# Patient Record
Sex: Female | Born: 1955 | Race: White | Hispanic: No | Marital: Married | Smoking: Never smoker
Health system: Southern US, Community
[De-identification: ages and names within clinical notes are randomized; demographics above are authoritative.]

## PROBLEM LIST (undated history)

## (undated) DIAGNOSIS — K219 Gastro-esophageal reflux disease without esophagitis: Secondary | ICD-10-CM

## (undated) DIAGNOSIS — G43909 Migraine, unspecified, not intractable, without status migrainosus: Secondary | ICD-10-CM

## (undated) DIAGNOSIS — M797 Fibromyalgia: Secondary | ICD-10-CM

## (undated) DIAGNOSIS — S82401A Unspecified fracture of shaft of right fibula, initial encounter for closed fracture: Secondary | ICD-10-CM

## (undated) DIAGNOSIS — E271 Primary adrenocortical insufficiency: Secondary | ICD-10-CM

## (undated) DIAGNOSIS — G219 Secondary parkinsonism, unspecified: Secondary | ICD-10-CM

## (undated) DIAGNOSIS — C719 Malignant neoplasm of brain, unspecified: Secondary | ICD-10-CM

## (undated) DIAGNOSIS — I10 Essential (primary) hypertension: Secondary | ICD-10-CM

## (undated) DIAGNOSIS — C711 Malignant neoplasm of frontal lobe: Secondary | ICD-10-CM

## (undated) DIAGNOSIS — N3289 Other specified disorders of bladder: Secondary | ICD-10-CM

## (undated) DIAGNOSIS — D649 Anemia, unspecified: Secondary | ICD-10-CM

## (undated) DIAGNOSIS — I639 Cerebral infarction, unspecified: Secondary | ICD-10-CM

## (undated) DIAGNOSIS — E878 Other disorders of electrolyte and fluid balance, not elsewhere classified: Secondary | ICD-10-CM

## (undated) DIAGNOSIS — G5760 Lesion of plantar nerve, unspecified lower limb: Secondary | ICD-10-CM

## (undated) DIAGNOSIS — R569 Unspecified convulsions: Secondary | ICD-10-CM

## (undated) HISTORY — DX: Lesion of plantar nerve, unspecified lower limb: G57.60

## (undated) HISTORY — DX: Cerebral infarction, unspecified: I63.9

## (undated) HISTORY — DX: Unspecified convulsions: R56.9

## (undated) HISTORY — DX: Secondary parkinsonism, unspecified: G21.9

## (undated) HISTORY — DX: Gastro-esophageal reflux disease without esophagitis: K21.9

## (undated) HISTORY — DX: Malignant neoplasm of brain, unspecified: C71.9

## (undated) HISTORY — DX: Fibromyalgia: M79.7

## (undated) HISTORY — DX: Migraine, unspecified, not intractable, without status migrainosus: G43.909

## (undated) HISTORY — PX: EXCISION MORTON'S NEUROMA: SHX5013

## (undated) HISTORY — DX: Other specified disorders of bladder: N32.89

## (undated) HISTORY — DX: Essential (primary) hypertension: I10

## (undated) HISTORY — DX: Other disorders of electrolyte and fluid balance, not elsewhere classified: E87.8

## (undated) HISTORY — PX: OTHER SURGICAL HISTORY: SHX169

---
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
## 1998-02-11 ENCOUNTER — Encounter: Payer: Self-pay | Admitting: Emergency Medicine

## 1998-02-11 ENCOUNTER — Emergency Department (HOSPITAL_COMMUNITY): Admission: EM | Admit: 1998-02-11 | Discharge: 1998-02-11 | Payer: Self-pay | Admitting: Emergency Medicine

## 1999-06-23 ENCOUNTER — Emergency Department (HOSPITAL_COMMUNITY): Admission: EM | Admit: 1999-06-23 | Discharge: 1999-06-23 | Payer: Self-pay

## 1999-12-23 ENCOUNTER — Other Ambulatory Visit: Admission: RE | Admit: 1999-12-23 | Discharge: 1999-12-23 | Payer: Self-pay | Admitting: *Deleted

## 1999-12-25 ENCOUNTER — Encounter: Admission: RE | Admit: 1999-12-25 | Discharge: 1999-12-25 | Payer: Self-pay | Admitting: *Deleted

## 2000-04-20 ENCOUNTER — Encounter: Payer: Self-pay | Admitting: Internal Medicine

## 2000-04-20 ENCOUNTER — Encounter: Admission: RE | Admit: 2000-04-20 | Discharge: 2000-04-20 | Payer: Self-pay | Admitting: Internal Medicine

## 2000-09-05 ENCOUNTER — Emergency Department (HOSPITAL_COMMUNITY): Admission: EM | Admit: 2000-09-05 | Discharge: 2000-09-05 | Payer: Self-pay | Admitting: Emergency Medicine

## 2000-09-05 ENCOUNTER — Encounter: Payer: Self-pay | Admitting: Emergency Medicine

## 2005-08-22 ENCOUNTER — Observation Stay (HOSPITAL_COMMUNITY): Admission: EM | Admit: 2005-08-22 | Discharge: 2005-08-24 | Payer: Self-pay | Admitting: Emergency Medicine

## 2007-02-09 ENCOUNTER — Encounter: Admission: RE | Admit: 2007-02-09 | Discharge: 2007-02-09 | Payer: Self-pay | Admitting: Internal Medicine

## 2008-04-03 ENCOUNTER — Encounter: Admission: RE | Admit: 2008-04-03 | Discharge: 2008-04-03 | Payer: Self-pay

## 2008-07-12 ENCOUNTER — Encounter (INDEPENDENT_AMBULATORY_CARE_PROVIDER_SITE_OTHER): Payer: Self-pay | Admitting: *Deleted

## 2008-09-18 ENCOUNTER — Ambulatory Visit: Payer: Self-pay | Admitting: Family Medicine

## 2008-09-18 DIAGNOSIS — C711 Malignant neoplasm of frontal lobe: Secondary | ICD-10-CM

## 2008-09-18 DIAGNOSIS — M546 Pain in thoracic spine: Secondary | ICD-10-CM | POA: Insufficient documentation

## 2008-09-18 HISTORY — DX: Malignant neoplasm of frontal lobe: C71.1

## 2008-09-19 ENCOUNTER — Telehealth (INDEPENDENT_AMBULATORY_CARE_PROVIDER_SITE_OTHER): Payer: Self-pay | Admitting: *Deleted

## 2008-09-19 LAB — CONVERTED CEMR LAB
AST: 14 units/L (ref 0–37)
Alkaline Phosphatase: 66 units/L (ref 39–117)
BUN: 22 mg/dL (ref 6–23)
Basophils Absolute: 0 10*3/uL (ref 0.0–0.1)
Bilirubin, Direct: 0 mg/dL (ref 0.0–0.3)
CO2: 29 meq/L (ref 19–32)
Calcium: 9.5 mg/dL (ref 8.4–10.5)
Chloride: 109 meq/L (ref 96–112)
Creatinine, Ser: 1 mg/dL (ref 0.4–1.2)
GFR calc non Af Amer: 62.12 mL/min (ref >60)
Glucose, Bld: 77 mg/dL (ref 70–99)
HCT: 40 % (ref 36.0–46.0)
HDL: 42.3 mg/dL (ref >39.00)
MCHC: 33 g/dL (ref 30.0–36.0)
RBC: 4.39 M/uL (ref 3.87–5.11)
RDW: 13.7 % (ref 11.5–14.6)
Sodium: 144 meq/L (ref 135–145)
Total Bilirubin: 0.5 mg/dL (ref 0.3–1.2)
Triglycerides: 92 mg/dL (ref 0.0–149.0)

## 2008-09-25 ENCOUNTER — Encounter: Payer: Self-pay | Admitting: Family Medicine

## 2008-09-25 ENCOUNTER — Ambulatory Visit: Payer: Self-pay | Admitting: Internal Medicine

## 2008-09-28 ENCOUNTER — Encounter (INDEPENDENT_AMBULATORY_CARE_PROVIDER_SITE_OTHER): Payer: Self-pay | Admitting: *Deleted

## 2008-10-03 ENCOUNTER — Telehealth (INDEPENDENT_AMBULATORY_CARE_PROVIDER_SITE_OTHER): Payer: Self-pay | Admitting: *Deleted

## 2008-10-16 ENCOUNTER — Ambulatory Visit: Payer: Self-pay | Admitting: Family Medicine

## 2008-10-16 ENCOUNTER — Encounter: Payer: Self-pay | Admitting: Family Medicine

## 2008-10-16 ENCOUNTER — Other Ambulatory Visit: Admission: RE | Admit: 2008-10-16 | Discharge: 2008-10-16 | Payer: Self-pay | Admitting: Family Medicine

## 2008-10-16 DIAGNOSIS — E785 Hyperlipidemia, unspecified: Secondary | ICD-10-CM | POA: Insufficient documentation

## 2008-10-20 ENCOUNTER — Encounter (INDEPENDENT_AMBULATORY_CARE_PROVIDER_SITE_OTHER): Payer: Self-pay | Admitting: *Deleted

## 2008-10-30 ENCOUNTER — Encounter: Payer: Self-pay | Admitting: Family Medicine

## 2009-01-15 ENCOUNTER — Ambulatory Visit: Payer: Self-pay | Admitting: Family

## 2009-01-16 ENCOUNTER — Encounter (INDEPENDENT_AMBULATORY_CARE_PROVIDER_SITE_OTHER): Payer: Self-pay | Admitting: *Deleted

## 2009-01-16 LAB — CONVERTED CEMR LAB
ALT: 24 units/L (ref 0–35)
AST: 22 units/L (ref 0–37)
Albumin: 3.8 g/dL (ref 3.5–5.2)
Alkaline Phosphatase: 63 units/L (ref 39–117)
Total Bilirubin: 0.7 mg/dL (ref 0.3–1.2)
Total Protein: 7.5 g/dL (ref 6.0–8.3)

## 2009-01-29 ENCOUNTER — Telehealth (INDEPENDENT_AMBULATORY_CARE_PROVIDER_SITE_OTHER): Payer: Self-pay | Admitting: *Deleted

## 2009-01-30 ENCOUNTER — Ambulatory Visit: Payer: Self-pay | Admitting: Family Medicine

## 2009-02-05 ENCOUNTER — Telehealth: Payer: Self-pay | Admitting: Family Medicine

## 2009-02-10 ENCOUNTER — Emergency Department (HOSPITAL_COMMUNITY): Admission: EM | Admit: 2009-02-10 | Discharge: 2009-02-10 | Payer: Self-pay | Admitting: Emergency Medicine

## 2009-02-14 ENCOUNTER — Encounter: Payer: Self-pay | Admitting: Family Medicine

## 2009-02-15 ENCOUNTER — Telehealth (INDEPENDENT_AMBULATORY_CARE_PROVIDER_SITE_OTHER): Payer: Self-pay | Admitting: *Deleted

## 2009-03-12 ENCOUNTER — Encounter: Payer: Self-pay | Admitting: Family Medicine

## 2009-04-09 ENCOUNTER — Ambulatory Visit: Payer: Self-pay | Admitting: Family Medicine

## 2009-04-10 ENCOUNTER — Telehealth (INDEPENDENT_AMBULATORY_CARE_PROVIDER_SITE_OTHER): Payer: Self-pay | Admitting: *Deleted

## 2009-04-10 LAB — CONVERTED CEMR LAB
Alkaline Phosphatase: 62 units/L (ref 39–117)
Bilirubin, Direct: 0 mg/dL (ref 0.0–0.3)
Direct LDL: 177.8 mg/dL
Total Bilirubin: 0.2 mg/dL — ABNORMAL LOW (ref 0.3–1.2)
Triglycerides: 88 mg/dL (ref 0.0–149.0)

## 2009-04-17 ENCOUNTER — Encounter (INDEPENDENT_AMBULATORY_CARE_PROVIDER_SITE_OTHER): Payer: Self-pay | Admitting: *Deleted

## 2009-04-17 ENCOUNTER — Encounter: Payer: Self-pay | Admitting: Family Medicine

## 2009-04-17 LAB — CONVERTED CEMR LAB
AST: 20 units/L
Albumin: 3.8 g/dL
Alkaline Phosphatase: 63 units/L
Chloride, Serum: 109 mmol/L
Glucose, Bld: 93 mg/dL
Potassium, serum: 4.6 mmol/L
Sodium, serum: 144 mmol/L
Total Protein: 6.7 g/dL

## 2009-04-23 ENCOUNTER — Encounter: Admission: RE | Admit: 2009-04-23 | Discharge: 2009-04-23 | Payer: Self-pay | Admitting: Family Medicine

## 2009-04-24 ENCOUNTER — Telehealth: Payer: Self-pay | Admitting: Family Medicine

## 2009-05-21 ENCOUNTER — Ambulatory Visit: Payer: Self-pay | Admitting: Family Medicine

## 2009-05-23 LAB — CONVERTED CEMR LAB
ALT: 17 units/L (ref 0–35)
AST: 20 units/L (ref 0–37)
Bilirubin, Direct: 0.1 mg/dL (ref 0.0–0.3)
Total Bilirubin: 0.3 mg/dL (ref 0.3–1.2)
Total Protein: 6.7 g/dL (ref 6.0–8.3)

## 2009-06-22 ENCOUNTER — Telehealth (INDEPENDENT_AMBULATORY_CARE_PROVIDER_SITE_OTHER): Payer: Self-pay | Admitting: *Deleted

## 2009-06-28 ENCOUNTER — Ambulatory Visit: Payer: Self-pay | Admitting: Family Medicine

## 2009-06-28 DIAGNOSIS — J309 Allergic rhinitis, unspecified: Secondary | ICD-10-CM | POA: Insufficient documentation

## 2009-07-16 ENCOUNTER — Ambulatory Visit: Payer: Self-pay | Admitting: Family Medicine

## 2009-07-16 DIAGNOSIS — B369 Superficial mycosis, unspecified: Secondary | ICD-10-CM | POA: Insufficient documentation

## 2009-08-27 ENCOUNTER — Ambulatory Visit: Payer: Self-pay | Admitting: Family Medicine

## 2009-08-27 DIAGNOSIS — R300 Dysuria: Secondary | ICD-10-CM | POA: Insufficient documentation

## 2009-08-27 LAB — CONVERTED CEMR LAB
Blood in Urine, dipstick: NEGATIVE
Glucose, Urine, Semiquant: NEGATIVE
Ketones, urine, test strip: NEGATIVE
Specific Gravity, Urine: 1.015
Urobilinogen, UA: 0.2
WBC Urine, dipstick: NEGATIVE

## 2009-09-21 ENCOUNTER — Telehealth (INDEPENDENT_AMBULATORY_CARE_PROVIDER_SITE_OTHER): Payer: Self-pay | Admitting: *Deleted

## 2009-10-09 ENCOUNTER — Telehealth (INDEPENDENT_AMBULATORY_CARE_PROVIDER_SITE_OTHER): Payer: Self-pay | Admitting: *Deleted

## 2009-11-12 ENCOUNTER — Telehealth: Payer: Self-pay | Admitting: Family Medicine

## 2009-11-14 ENCOUNTER — Telehealth: Payer: Self-pay | Admitting: Family Medicine

## 2009-11-27 ENCOUNTER — Other Ambulatory Visit: Admission: RE | Admit: 2009-11-27 | Discharge: 2009-11-27 | Payer: Self-pay | Admitting: Family Medicine

## 2009-11-27 ENCOUNTER — Ambulatory Visit: Payer: Self-pay | Admitting: Family Medicine

## 2009-11-27 ENCOUNTER — Encounter: Payer: Self-pay | Admitting: Internal Medicine

## 2009-11-27 DIAGNOSIS — G43009 Migraine without aura, not intractable, without status migrainosus: Secondary | ICD-10-CM | POA: Insufficient documentation

## 2009-11-27 DIAGNOSIS — F329 Major depressive disorder, single episode, unspecified: Secondary | ICD-10-CM

## 2009-11-27 DIAGNOSIS — R252 Cramp and spasm: Secondary | ICD-10-CM

## 2009-11-27 DIAGNOSIS — G4700 Insomnia, unspecified: Secondary | ICD-10-CM

## 2009-11-27 DIAGNOSIS — F3289 Other specified depressive episodes: Secondary | ICD-10-CM | POA: Insufficient documentation

## 2009-11-27 DIAGNOSIS — IMO0001 Reserved for inherently not codable concepts without codable children: Secondary | ICD-10-CM

## 2009-11-30 LAB — CONVERTED CEMR LAB
Basophils Relative: 0.6 % (ref 0.0–3.0)
Bilirubin, Direct: 0.2 mg/dL (ref 0.0–0.3)
Chloride: 105 meq/L (ref 96–112)
GFR calc non Af Amer: 64.04 mL/min (ref >60)
LDL Cholesterol: 97 mg/dL (ref 0–99)
Lymphocytes Relative: 25.9 % (ref 12.0–46.0)
Lymphs Abs: 1.4 10*3/uL (ref 0.7–4.0)
MCV: 92 fL (ref 78.0–100.0)
Monocytes Absolute: 0.4 10*3/uL (ref 0.1–1.0)
Monocytes Relative: 7.7 % (ref 3.0–12.0)
Platelets: 297 10*3/uL (ref 150.0–400.0)
RDW: 13.8 % (ref 11.5–14.6)
TSH: 1.58 microintl units/mL (ref 0.35–5.50)
Triglycerides: 90 mg/dL (ref 0.0–149.0)
Vit D, 25-Hydroxy: 53 ng/mL (ref 30–89)
WBC: 5.3 10*3/uL (ref 4.5–10.5)

## 2009-12-03 LAB — CONVERTED CEMR LAB

## 2009-12-25 ENCOUNTER — Encounter: Payer: Self-pay | Admitting: Family Medicine

## 2009-12-25 ENCOUNTER — Ambulatory Visit: Payer: Self-pay | Admitting: Family Medicine

## 2009-12-27 ENCOUNTER — Encounter: Payer: Self-pay | Admitting: Family Medicine

## 2009-12-27 LAB — CONVERTED CEMR LAB
Anti Nuclear Antibody(ANA): NEGATIVE
Rhuematoid fact SerPl-aCnc: <20 intl units/mL (ref 0–20)
Total CK: 92 units/L (ref 7–177)

## 2010-01-10 ENCOUNTER — Encounter (INDEPENDENT_AMBULATORY_CARE_PROVIDER_SITE_OTHER): Payer: Self-pay | Admitting: *Deleted

## 2010-01-11 ENCOUNTER — Ambulatory Visit
Admission: RE | Admit: 2010-01-11 | Discharge: 2010-01-11 | Payer: Self-pay | Source: Home / Self Care | Attending: Internal Medicine | Admitting: Internal Medicine

## 2010-01-27 ENCOUNTER — Encounter: Payer: Self-pay | Admitting: Internal Medicine

## 2010-01-30 ENCOUNTER — Telehealth (INDEPENDENT_AMBULATORY_CARE_PROVIDER_SITE_OTHER): Payer: Self-pay | Admitting: *Deleted

## 2010-01-31 ENCOUNTER — Ambulatory Visit
Admission: RE | Admit: 2010-01-31 | Discharge: 2010-01-31 | Payer: Self-pay | Source: Home / Self Care | Attending: Internal Medicine | Admitting: Internal Medicine

## 2010-01-31 ENCOUNTER — Encounter: Payer: Self-pay | Admitting: Internal Medicine

## 2010-02-07 ENCOUNTER — Telehealth: Payer: Self-pay | Admitting: Family Medicine

## 2010-02-07 NOTE — Progress Notes (Signed)
Summary: simvastatin refill   Phone Note Refill Request Message from:  Fax from Pharmacy on pg drug fax [phone]  Refills Requested: Medication #1:  SIMVASTATIN 40 MG TABS Take one tablet at bedtime. Initial call taken by: Barb Merino,  February 15, 2009 10:30 AM    Prescriptions: SIMVASTATIN 40 MG TABS (SIMVASTATIN) Take one tablet at bedtime  #30 x 3   Entered by:   Doristine Devoid   Authorized by:   Neena Rhymes MD   Signed by:   Doristine Devoid on 02/15/2009   Method used:   Electronically to        [facility]* (retail)       [address] [place], [place]  [address]       Ph: [phone] or [phone]       Fax: [phone]   RxID:   [id number]

## 2010-02-07 NOTE — Letter (Signed)
Summary: Miralax Instructions  [facility]  [address]   [place], [place] [address]   Phone: [phone]  Fax: [phone]       [name]    [birth year]    MRN: [id number]       Procedure Day Dorna Bloom: Thursday, 01-31-10     Arrival Time: 10:30 a.m.     Procedure Time: 11:30 a.m.     Location of Procedure:                    x   [facility] (4th Floor)    PREPARATION FOR COLONOSCOPY WITH MIRALAX  Starting 5 days prior to your procedure 01-26-10 do not eat nuts, seeds, popcorn, corn, beans, peas,  salads, or any raw vegetables.  Do not take any fiber supplements (e.g. Metamucil, Citrucel, and Benefiber). ____________________________________________________________________________________________________   THE DAY BEFORE YOUR PROCEDURE         DATE: 01-30-10 DAY: Wednesday  1   Drink clear liquids the entire day-NO SOLID FOOD  2   Do not drink anything colored red or purple.  Avoid juices with pulp.  No orange juice.  3   Drink at least 64 oz. (8 glasses) of fluid/clear liquids during the day to prevent dehydration and help the prep work efficiently.  CLEAR LIQUIDS INCLUDE: Water Jello Ice Popsicles Tea (sugar ok, no milk/cream) Powdered fruit flavored drinks Coffee (sugar ok, no milk/cream) Gatorade Juice: apple, white grape, white cranberry  Lemonade Clear bullion, consomm, broth Carbonated beverages (any kind) Strained chicken noodle soup Hard Candy  4   Mix the entire bottle of Miralax with 64 oz. of Gatorade/Powerade in the morning and put in the refrigerator to chill.  5   At 3:00 pm take 2 Dulcolax/Bisacodyl tablets.  6   At 4:30 pm take one Reglan/Metoclopramide tablet.  7  Starting at 5:00 pm drink one 8 oz glass of the Miralax mixture every 15-20 minutes until you have finished drinking the entire 64 oz.  You should finish drinking prep around 7:30 or 8:00 pm.  8   If you are nauseated, you may take the 2nd  Reglan/Metoclopramide tablet at 6:30 pm.        9    At 8:00 pm take 2 more DULCOLAX/Bisacodyl tablets.     THE DAY OF YOUR PROCEDURE      DATE:  01-31-10  DAY: Thursday  You may drink clear liquids until 9:30 a.m. (2 HOURS BEFORE PROCEDURE).   MEDICATION INSTRUCTIONS  Unless otherwise instructed, you should take regular prescription medications with a small sip of water as early as possible the morning of your procedure.           OTHER INSTRUCTIONS  You will need a responsible adult at least 53 years of age to accompany you and drive you home.   This person must remain in the waiting room during your procedure.  Wear loose fitting clothing that is easily removed.  Leave jewelry and other valuables at home.  However, you may wish to bring a book to read or an iPod/MP3 player to listen to music as you wait for your procedure to start.  Remove all body piercing jewelry and leave at home.  Total time from sign-in until discharge is approximately 2-3 hours.  You should go home directly after your procedure and rest.  You can resume normal activities the day after your procedure.  The day of your procedure you should not:   Drive  Make legal decisions   Operate machinery   Drink alcohol   Return to work  You will receive specific instructions about eating, activities and medications before you leave.   The above instructions have been reviewed and explained to me by   Sherren Kerns RN  January 11, 2010 8:51 AM   I fully understand and can verbalize these instructions _____________________________ Date _______

## 2010-02-07 NOTE — Progress Notes (Signed)
Summary: fyi re Crestor  Phone Note Call from Patient Call back at Home Phone [phone]   Caller: Patient Summary of Call: c/o having a terrible time going to the bathroom since starting Crestor about a week ago.  -Pt has not tried anything OTC, recommed pt can use Miralax, dulcolax, pt agreed she will try Miralax, will call if no better Initial call taken by: Kandice Hams,  April 24, 2009 12:15 PM  Follow-up for Phone Call        agree.  will follow Follow-up by: Neena Rhymes MD,  April 24, 2009 12:44 PM

## 2010-02-07 NOTE — Assessment & Plan Note (Signed)
Summary: CPX & LAB/CBS  Flu Vaccine Consent Questions     Do you have a history of severe allergic reactions to this vaccine? no    Any prior history of allergic reactions to egg and/or gelatin? no    Do you have a sensitivity to the preservative Thimersol? no    Do you have a past history of Guillan-Barre Syndrome? no    Do you currently have an acute febrile illness? no    Have you ever had a severe reaction to latex? no    Vaccine information given and explained to patient? yes    Are you currently pregnant? no    Lot Number:[id number]   Exp Date:07/06/2010   Site Given  Left Deltoid IM    Vital Signs:  Patient profile:   55 year old female Height:      63 inches Weight:      217 pounds BMI:     38.58 Pulse rate:   91 / minute BP sitting:   110 / 78  (left arm)  Vitals Entered By: Doristine Devoid CMA (November 27, 2009 8:26 AM) CC: CPX AND LABS W/ PAP   History of Present Illness: 55 yo woman here today for CPE.    Migraine- recurrent problem for pt, currently having sxs.  usual tx is toradol and phenergan.  pt has a driver.  leg cramps- occuring at night.  both calfs and feet.  insomnia- reports problems staying asleep.  'i fall asleep pretty easily but once i'm up- i'm stuck'.  unisom w/out relief.  myalgias- reports arms and hands 'feel like they're going to fall off' when pt wakes in the AM.  has not discussed this w/ neuro.  mom w/ fibromylagia 'really bad'.  weight gain- has gained 8 lbs since Aug.  has been as big as 250 and as low as 180.  denies change in appetite or diet.  not exercising- 'i just don't feel like it'.  cough- sxs started 2 weeks ago, slowly improving.  no fevers.  will have CP w/ cough.  cough is rarely productive of sputum.  no ear pain.  + maxillary pressure.  + sick contacts.  Preventive Screening-Counseling & Management  Alcohol-Tobacco     Alcohol drinks/day: 0     Smoking Status: never  Caffeine-Diet-Exercise     Does Patient Exercise:  no      Sexual History:  currently monogamous.        Drug Use:  never.    Allergies: 1)  ! Pcn 2)  ! Cipro 3)  ! * Methocarbamol  Past History:  Past medical, surgical, family and social histories (including risk factors) reviewed, and no changes noted (except as noted below).  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 APPENDECTOMY 1976 [facility] NEUROMA R FOOT BRAIN CA  1993 2005  Family History: Reviewed history from 09/18/2008 and no changes required. DIABETES--MOTHER,BROTHER HTN--MOTHER,FATHER  Social History: Reviewed history from 09/18/2008 and no changes required. Married Never Smoked Alcohol use-no Drug use-no  Review of Systems       The patient complains of weight gain, headaches, hematochezia, and depression.  The patient denies anorexia, fever, weight loss, vision loss, decreased hearing, hoarseness, chest pain, syncope, dyspnea on exertion, peripheral edema, prolonged cough, abdominal pain, melena, severe indigestion/heartburn, hematuria, suspicious skin lesions, abnormal bleeding, enlarged  lymph nodes, and breast masses.         BRBPR- hx of hemorrhoids, GI eval upcoming  Physical Exam  General:  Overwt, well-developed,well-nourished,in no acute distress; alert,appropriate and cooperative throughout examination Head:  Normocephalic and atraumatic without obvious abnormalities. No apparent alopecia or balding.  no TTP over sinuses Eyes:  No corneal or conjunctival inflammation noted. EOMI. Perrla. Funduscopic exam benign, without hemorrhages, exudates or papilledema. Vision grossly normal. Ears:  External ear exam shows no significant lesions or deformities.  Otoscopic examination reveals clear canals, tympanic membranes are intact bilaterally  without bulging, retraction, inflammation or discharge. Hearing is grossly normal bilaterally. Nose:  + congestion, mild turbinate edema Mouth:  + PND, otherwise normal Neck:  No deformities, masses, or tenderness noted. Breasts:  No mass, nodules, thickening, tenderness, bulging, retraction, inflamation, nipple discharge or skin changes noted.   Lungs:  Normal respiratory effort, chest expands symmetrically. Lungs are clear to auscultation, no crackles or wheezes. Heart:  Normal rate and regular rhythm. S1 and S2 normal without gallop, murmur, click, rub or other extra sounds. Abdomen:  Bowel sounds positive,abdomen soft and non-tender without masses, organomegaly or hernias noted. Genitalia:  Pelvic Exam:        External: normal female genitalia without lesions or masses        Vagina: normal without lesions or masses        Cervix: normal without lesions or masses        Adnexa: normal bimanual exam without masses or fullness        Uterus: normal by palpation        Pap smear: performed Msk:  + TTP over multiple trigger points on back, arms, legs Pulses:  +2 carotid, radial, DP Extremities:  No clubbing, cyanosis, edema, or deformity noted with normal full range of motion of all joints.   Neurologic:  No cranial nerve deficits noted. Station and gait are normal. Plantar reflexes are down-going bilaterally. DTRs are symmetrical throughout. Sensory, motor and coordinative functions appear intact. Skin:  Intact without suspicious lesions or rashes Cervical Nodes:  No lymphadenopathy noted Axillary Nodes:  No palpable lymphadenopathy Psych:  Cognition and judgment appear intact. Alert and cooperative with normal attention span and concentration. No apparent delusions, illusions, hallucinations   Impression & Recommendations:  Problem # 1:  PHYSICAL EXAMINATION (ICD-V70.0) Assessment Unchanged Pt's PE WNL w/ exception of TTP over multiple trigger points.  check labs.  pap collected.  UTD  on mammogram.  colonoscopy upcoming.  anticipatory guidance provided. Orders: Venipuncture (16109) T-Vitamin D (25-Hydroxy) (60454-09811) Specimen Handling (91478) TLB-BMP (Basic Metabolic Panel-BMET) (80048-METABOL) TLB-CBC Platelet - w/Differential (85025-CBCD) TLB-TSH (Thyroid Stimulating Hormone) (84443-TSH)  Problem # 2:  COMMON MIGRAINE (ICD-346.10) Assessment: New pt reports this is not new for her.  usual tx is toradol and phenergan.  injxns provided. Her updated medication list for this problem includes:    Naprosyn 500 Mg Tabs (Naproxen) .Marland Kitchen... Take one tablet two times a day x7 days then as needed  Orders: Promethazine up to 50mg  (J2550) Ketorolac-Toradol 15mg  (G9562) Admin of Therapeutic Inj  intramuscular or subcutaneous (13086)  Problem # 3:  LEG CRAMPS (ICD-729.82) Assessment: New check K+.  encouraged increased fluid intake.  reviewed foods high in K+.  Problem # 4:  DEPRESSIVE DISORDER (ICD-311) Assessment: New pt's cluster of sxs- depression, insomnia, myalgias- plus TTP over multiple trigger points is consistent w/ fibromyalgia (which runs in the family).  start cymbalta for depression in the hopes that it will also improve  her other fibro sxs.  Pt expresses understanding and is in agreement w/ this plan. Her updated medication list for this problem includes:    Cymbalta 30 Mg Cpep (Duloxetine hcl) .Marland Kitchen... 1 tab by mouth daily  Problem # 5:  MYALGIA (ICD-729.1) Assessment: New see #4 The following medications were removed from the medication list:    Methocarbamol 500 Mg Tabs (Methocarbamol) .Marland Kitchen... Take 1-2 tabs three times a day as needed Her updated medication list for this problem includes:    Naprosyn 500 Mg Tabs (Naproxen) .Marland Kitchen... Take one tablet two times a day x7 days then as needed  Problem # 6:  INSOMNIA-SLEEP DISORDER-UNSPEC (ICD-780.52) Assessment: New likely related to her depression (see #4)  Problem # 7:  COUGH (ICD-786.2) Assessment: New likely  viral.  no evidence of bacterial infxn on PE.  reviewed supportive care and red flags that should prompt return.  Pt expresses understanding and is in agreement w/ this plan.  Problem # 8:  OBESITY (ICD-278.00) Assessment: Unchanged pt gaining wt w/out changing diet.  not exercising due to pains and fatigue- likely a consequence of her depression.  start cymbalta and follow closely.  Complete Medication List: 1)  Keppra 500 Mg Tabs (Levetiracetam) .... 3 in am and 3 in pm 2)  Citracal  .... 1 in am 1 in pm 3)  Omega 3  .Marland Kitchen.. 1 in am 1 in pm 4)  Estrace 0.1 Mg/gm Crea (Estradiol) .... Use 3x/week 5)  Crestor 5 Mg Tabs (Rosuvastatin calcium) .... Take 1 tab by mouth daily 6)  Naprosyn 500 Mg Tabs (Naproxen) .... Take one tablet two times a day x7 days then as needed 7)  Nasonex 50 Mcg/act Susp (Mometasone furoate) .... 2 sprays each nostril once daily 8)  Nystatin 100000 Unit/gm Powd (Nystatin) .... Apply to affected area 2-3x/day.  disp 1 large container 9)  Cymbalta 30 Mg Cpep (Duloxetine hcl) .Marland Kitchen.. 1 tab by mouth daily  Other Orders: Admin 1st Vaccine (63875) Flu Vaccine 19yrs + (64332) TLB-Lipid Panel (80061-LIPID) TLB-Hepatic/Liver Function Pnl (80076-HEPATIC)  Patient Instructions: 1)  Please schedule a follow-up appointment in 1 month to recheck mood/pain/sleep. 2)  Start the Cymbalta daily to improve your mood and pain- hopefully the sleep, too 3)  Increase the potassium in your diet 4)  Drink plenty of fluids 5)  Your cough is likely a virus and should continue to improve w/ time 6)  We'll notify you of your lab results 7)  Call with any questions or concerns 8)  Hang in there! 9)  Happy Thanksgiving!  Prescriptions: CYMBALTA 30 MG CPEP (DULOXETINE HCL) 1 tab by mouth daily  #30 x 6   Entered and Authorized by:   Neena Rhymes MD   Signed by:   Neena Rhymes MD on 11/27/2009   Method used:   Electronically to        [facility]* (retail)       [address] [place], [place]  [address]       Ph: [phone] or [phone]       Fax: [phone]   RxID:   [id number]    Medication Administration  Injection # 1:    Medication: Promethazine up to 50mg     Diagnosis: COMMON MIGRAINE (ICD-346.10)    Route: IM    Site: LUOQ gluteus    Exp Date: 09/07/2010    Lot #:  [id number]    Mfr: Novaplus    Given by: Doristine Devoid CMA (November 27, 2009 9:24 AM)  Injection # 2:    Medication: Ketorolac-Toradol 15mg     Diagnosis: COMMON MIGRAINE (ICD-346.10)    Route: IM    Site: RUOQ gluteus    Exp Date: 03/07/2011    Lot #: [id number]    Mfr: Novaplus    Comments: patient given 60mg      Given by: Doristine Devoid CMA (November 27, 2009 9:25 AM)  Orders Added: 1)  Venipuncture [11914] 2)  T-Vitamin D (25-Hydroxy) [id number] 3)  Admin 1st Vaccine [90471] 4)  Flu Vaccine 65yrs + [86578] 5)  Promethazine up to 50mg  [J2550] 6)  Ketorolac-Toradol 15mg  [J1885] 7)  Admin of Therapeutic Inj  intramuscular or subcutaneous [96372] 8)  Specimen Handling [99000] 9)  TLB-Lipid Panel [80061-LIPID] 10)  TLB-Hepatic/Liver Function Pnl [80076-HEPATIC] 11)  TLB-BMP (Basic Metabolic Panel-BMET) [80048-METABOL] 12)  TLB-CBC Platelet - w/Differential [85025-CBCD] 13)  TLB-TSH (Thyroid Stimulating Hormone) [84443-TSH] 14)  Est. Patient 40-64 years [99396] 15)  Est. Patient Level IV [46962]     Medication Administration  Injection # 1:    Medication: Promethazine up to 50mg     Diagnosis: COMMON MIGRAINE (ICD-346.10)    Route: IM    Site: LUOQ gluteus    Exp Date: 09/07/2010    Lot #: [id number]    Mfr: Novaplus    Given by: Doristine Devoid CMA (November 27, 2009 9:24 AM)  Injection # 2:    Medication: Ketorolac-Toradol 15mg     Diagnosis: COMMON MIGRAINE (ICD-346.10)    Route: IM    Site: RUOQ gluteus    Exp Date: 03/07/2011    Lot #: [id number]    Mfr: Novaplus    Comments: patient given 60mg       Given by: Doristine Devoid CMA (November 27, 2009 9:25 AM)  Orders Added: 1)  Venipuncture [27253] 2)  T-Vitamin D (25-Hydroxy) [66440-34742] 3)  Admin 1st Vaccine [90471] 4)  Flu Vaccine 32yrs + [59563] 5)  Promethazine up to 50mg  [J2550] 6)  Ketorolac-Toradol 15mg  [J1885] 7)  Admin of Therapeutic Inj  intramuscular or subcutaneous [96372] 8)  Specimen Handling [99000] 9)  TLB-Lipid Panel [80061-LIPID] 10)  TLB-Hepatic/Liver Function Pnl [80076-HEPATIC] 11)  TLB-BMP (Basic Metabolic Panel-BMET) [80048-METABOL] 12)  TLB-CBC Platelet - w/Differential [85025-CBCD] 13)  TLB-TSH (Thyroid Stimulating Hormone) [84443-TSH] 14)  Est. Patient 40-64 years [99396] 15)  Est. Patient Level IV [87564]

## 2010-02-07 NOTE — Assessment & Plan Note (Signed)
Summary: back pain//kn   Vital Signs:  Patient profile:   55 year old female Weight:      204 pounds Pulse rate:   60 / minute BP sitting:   120 / 80  (left arm)  Vitals Entered By: Doristine Devoid (June 28, 2009 8:46 AM) CC: back pain    History of Present Illness: 55 yo woman here today for back pain.  reports pain is similar to what it was in Sept.  has been taking Naprosyn w/out relief.  pain returned 2-3 weeks ago.  pain is again thoracic.  no numbness or tingling, weakness more than usual.  pain is bandlike across the back. can't recall any trigger.  'a glob of yuck'.  + nasal congestion for 2 weeks.  no fevers.  mild cough, occasionally productive.  no ear pain.  + sick contacts.  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 3 in Am and 3 in Pm 2)  Citracal .... 1 in Am 1 in Pm 3)  Omega 3 .Marland Kitchen.. 1 in Am 1 in Pm 4)  Estrace 0.1 Mg/gm Crea (Estradiol) .... Use 3x/week 5)  Crestor 5 Mg  Tabs (Rosuvastatin Calcium) .... Take 1 Tab By Mouth Daily 6)  Naprosyn 500 Mg Tabs (Naproxen) .... Take One Tablet Two Times A Day X7 Days Then As Needed  Allergies (verified): 1)  ! Pcn 2)  ! 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 No apparent alopecia or balding.  no TTP over sinuses Eyes:  no  injxn or inflammation Ears:  External ear exam shows no significant lesions or deformities.  Otoscopic examination reveals clear canals, tympanic membranes are intact bilaterally without bulging, retraction, inflammation or discharge. Hearing is grossly normal bilaterally. Nose:  + congestion, mild turbinate edema Mouth:  + PND Neck:  No deformities, masses, or tenderness noted. Lungs:  Normal respiratory effort, chest expands symmetrically. Lungs are clear to auscultation, no crackles or wheezes. Heart:  Normal rate and regular rhythm. S1 and S2 normal without gallop, murmur, click, rub or other extra sounds. Msk:  + trapezius spasm TTP over thoracic paraspinal muscles good flexion and extension of back, (-) SLR Extremities:  No clubbing, cyanosis, edema  Neurologic:  No cranial nerve deficits noted. Station and gait are normal. Plantar reflexes are down-going bilaterally. DTRs are symmetrical throughout. Sensory, motor and coordinative functions appear intact.   Impression & Recommendations:  Problem # 1:  BACK PAIN, THORACIC REGION (ICD-724.1) Assessment Unchanged pain again consistent w/ muscle spasm.  no red flags on hx or PE.  restart muscle relaxer and NSAIDs.  reviewed supportive care and red flags that should prompt return.  Pt expresses understanding and is in agreement w/ this plan. Her updated medication list for this problem includes:    Naprosyn 500 Mg Tabs (Naproxen) .Marland Kitchen... Take one tablet two times a day x7 days then as needed  Cyclobenzaprine Hcl 10 Mg Tabs (Cyclobenzaprine hcl) .Marland Kitchen... 1 by mouth 2 times daily as needed for back pain.  will cause drowsiness  Problem # 2:  RHINITIS (ICD-477.9) Assessment: New pt's 'glob of yuck' consistent w/ nasal allergies.  start OTC antihistamine and nasal steroid spray.  no evidence of infxn.  reviewed supportive care and red flags that should prompt return.  Pt expresses understanding and is in agreement w/ this plan. Her updated  medication list for this problem includes:    Nasonex 50 Mcg/act Susp (Mometasone furoate) .Marland Kitchen... 2 sprays each nostril once daily  Complete Medication List: 1)  Keppra 500 Mg Tabs (Levetiracetam) .... 3 in am and 3 in pm 2)  Citracal  .... 1 in am 1 in pm 3)  Omega 3  .Marland Kitchen.. 1 in am 1 in pm 4)  Estrace 0.1 Mg/gm Crea (Estradiol) .... Use 3x/week 5)  Crestor 5 Mg Tabs (Rosuvastatin calcium) .... Take 1 tab by mouth daily 6)  Naprosyn 500 Mg Tabs (Naproxen) .... Take one tablet two times a day x7 days then as needed 7)  Nasonex 50 Mcg/act Susp (Mometasone furoate) .... 2 sprays each nostril once daily 8)  Cyclobenzaprine Hcl 10 Mg Tabs (Cyclobenzaprine hcl) .Marland Kitchen.. 1 by mouth 2 times daily as needed for back pain.  will cause drowsiness  Patient Instructions: 1)  Follow up for your complete physical in October 2)  Take the Cyclobenzaprine as directed for muscle spasm 3)  Continue the Naproxen 4)  Use a heating pad for pain relief 5)  Start the nasonex to decrease congestion and post nasal drip 6)  Claritin or Zyrtec daily for seasonal allergies 7)  Call with any questions or concerns 8)  Hang in there! Prescriptions: CRESTOR 5 MG  TABS (ROSUVASTATIN CALCIUM) Take 1 tab by mouth daily  #30 x 6   Entered and Authorized by:   Neena Rhymes MD   Signed by:   Neena Rhymes MD on 06/28/2009   Method used:   Electronically to        [facility]* (retail)       [address] [place], [place]  [address]       Ph: [phone] or [phone]       Fax: [phone]   RxID:   [id number] ESTRACE 0.1 MG/GM CREA (ESTRADIOL) use 3x/week  #1 month x 6   Entered and Authorized by:   Neena Rhymes MD   Signed by:   Neena Rhymes MD on 06/28/2009   Method used:   Electronically to        [facility]* (retail)       [address] [place], [place]  [address]        Ph: [phone] or [phone]       Fax: [phone]   RxID:   [id number] CYCLOBENZAPRINE HCL 10 MG  TABS (CYCLOBENZAPRINE HCL) 1 by mouth 2 times daily as needed for back pain.  will cause drowsiness  #30 x 0   Entered and Authorized by:   Neena Rhymes MD   Signed by:   Neena Rhymes MD on 06/28/2009   Method used:   Electronically to        [facility]* (retail)  [address] [place], [place]  [address]       Ph: [phone] or [phone]       Fax: [phone]   RxID:   [id number] NASONEX 50 MCG/ACT SUSP (MOMETASONE FUROATE) 2 sprays each nostril once daily  #1 x 3   Entered and Authorized by:   Neena Rhymes MD   Signed by:   Neena Rhymes MD on 06/28/2009   Method used:   Electronically to        [facility]* (retail)       [address] [place], [place]  [address]       Ph: [phone] or [phone]       Fax: [phone]   RxID:   [id number]

## 2010-02-07 NOTE — Letter (Signed)
Summary: [facility]iation [facility]  [facility]iation [facility]   Imported By: Lanelle Bal 01/11/2010 12:21:29  _____________________________________________________________________  External Attachment:    Type:   Image     Comment:   External Document

## 2010-02-07 NOTE — Letter (Signed)
Summary: Results Follow up Letter  [facility] [place]  [address] [place], [place] [address]   Phone: [phone]  Fax: [phone]    01/16/2009 MRN: [id number]  [facility] [address] [place], [place]  [address]  Dear [name],  The following are the results of your recent test(s):  Test         Result    Pap Smear:        Normal _____  Not Normal _____ Comments: ______________________________________________________ Cholesterol: LDL(Bad cholesterol):         Your goal is less than:         HDL (Good cholesterol):       Your goal is more than: Comments:  ______________________________________________________ Mammogram:        Normal _____  Not Normal _____ Comments:  ___________________________________________________________________ Hemoccult:        Normal _____  Not normal _______ Comments:    _____________________________________________________________________ Other Tests:  See attachment for results.   We routinely do not discuss normal results over the telephone.  If you desire a copy of the results, or you have any questions about this information we can discuss them at your next office visit.   Sincerely,    Army Fossa CMA  January 16, 2009 11:17 AM

## 2010-02-07 NOTE — Progress Notes (Signed)
Summary: Meds?   Phone Note Call from Patient   Summary of Call: Pt would like to know since her labs came back normal does she need to start her zocor again? Army Fossa CMA  February 05, 2009 4:23 PM   Follow-up for Phone Call        did muscle aches go away? If yes we can try something else---- try crestor 5 mg every other day.   give samples first.   Follow-up by: Loreen Freud DO,  February 05, 2009 4:44 PM  Additional Follow-up for Phone Call Additional follow up Details #1::        Pt states that muscles aches have not gone away completly. Army Fossa CMA  February 05, 2009 4:54 PM     Additional Follow-up for Phone Call Additional follow up Details #2::    If she still has muscle aches than it probably has nothing to do with zocor---ok to start back ---  ov to evaluated other reasons for muscle aches.   Follow-up by: Loreen Freud DO,  February 05, 2009 5:02 PM  Additional Follow-up for Phone Call Additional follow up Details #3:: Details for Additional Follow-up Action Taken: Pt is aware. Army Fossa CMA  February 05, 2009 5:06 PM

## 2010-02-07 NOTE — Progress Notes (Signed)
Summary: crestor causing leg pain  Phone Note Call from Patient Call back at Home Phone [phone]   Caller: Patient Summary of Call: spoke w/ patient says that she has been unable to tolerate crestor has been causing terrible leg pain patient has stopped medication and would like to know if anything else she can take that may have less of symptoms   Follow-up for Phone Call        she can take Co Q10 w/ the medicine and take it every other day to see if pains improve.  if pains don't improve can switch to Zetia. Follow-up by: Neena Rhymes MD,  January 30, 2010 12:09 PM  Additional Follow-up for Phone Call Additional follow up Details #1::        spoke w/ patient aware of instructions....Marland KitchenMarland KitchenDoristine Devoid CMA  January 30, 2010 2:39 PM

## 2010-02-07 NOTE — Progress Notes (Signed)
Summary: rx for naproxen-lmom  Phone Note Call from Patient Call back at Home Phone [phone]   Caller: Patient Summary of Call: patient left msg on vm having another flare up of back and wanted to get another prescription for naxproxen  Follow-up for Phone Call        left message on machine .........Marland KitchenDoristine Devoid  June 22, 2009 12:13 PM     New/Updated Medications: NAPROSYN 500 MG TABS (NAPROXEN) take one tablet two times a day x7 days then as needed Prescriptions: NAPROSYN 500 MG TABS (NAPROXEN) take one tablet two times a day x7 days then as needed  #60 x 0   Entered by:   Doristine Devoid   Authorized by:   Neena Rhymes MD   Signed by:   Doristine Devoid on 06/22/2009   Method used:   Electronically to        [facility]* (retail)       [address] [place], [place]  [address]       Ph: [phone] or [phone]       Fax: [phone]   RxID:   [id number]

## 2010-02-07 NOTE — Progress Notes (Signed)
Summary: NEEDS GENERIC FLEXIRIL REFILL  Phone Note Refill Request Call back at Home Phone [phone] Message from:  Patient on October 09, 2009 4:34 PM  Refills Requested: Medication #1:  CYCLOBENZAPRINE HCL 10 MG  TABS 1 by mouth 2 times daily as needed for back pain.  will cause drowsiness [facility]  Initial call taken by: Jerolyn Shin,  October 09, 2009 4:34 PM    Prescriptions: CYCLOBENZAPRINE HCL 10 MG  TABS (CYCLOBENZAPRINE HCL) 1 by mouth 2 times daily as needed for back pain.  will cause drowsiness  #30 x 0   Entered by:   Doristine Devoid CMA   Authorized by:   Neena Rhymes MD   Signed by:   Doristine Devoid CMA on 10/09/2009   Method used:   Electronically to        [facility]* (retail)       [address] [place], [place]  [address]       Ph: [phone] or [phone]       Fax: [phone]   RxID:   [id number]

## 2010-02-07 NOTE — Assessment & Plan Note (Signed)
Summary: rash/itch in pubic area//kn   Vital Signs:  Patient profile:   55 year old female Height:      63 inches Weight:      203 pounds BMI:     36.09 Pulse rate:   98 / minute BP sitting:   120 / 80  (left arm)  Vitals Entered By: Doristine Devoid (July 16, 2009 8:49 AM) CC: rash in pubic area x5 days    History of Present Illness: 55 yo woman here today for rash in groin.  sxs 1st appeared 5 days ago.  + itching, raw.  used Vagisil and cornstarch.  took Diflucan x1.  no relief.  pt also concerned about recent wt gain.  was previously 250 but dropped down to 180 lbs.  now up to 200 lbs.  reports 'nothing has changed'.  admits to emotional eating.  trying to walk for 30 minutes daily.  Current Medications (verified): 1)  Keppra 500 Mg Tabs (Levetiracetam) .... 3 in Am and 3 in Pm 2)  Citracal .... 1 in Am 1 in Pm 3)  Omega 3 .Marland Kitchen.. 1 in Am 1 in Pm 4)  Estrace 0.1 Mg/gm Crea (Estradiol) .... Use 3x/week 5)  Crestor 5 Mg  Tabs (Rosuvastatin Calcium) .... Take 1 Tab By Mouth Daily 6)  Naprosyn 500 Mg Tabs (Naproxen) .... Take One Tablet Two Times A Day X7 Days Then As Needed 7)  Nasonex 50 Mcg/act Susp (Mometasone Furoate) .... 2 Sprays Each Nostril Once Daily 8)  Cyclobenzaprine Hcl 10 Mg  Tabs (Cyclobenzaprine Hcl) .Marland Kitchen.. 1 By Mouth 2 Times Daily As Needed For Back Pain.  Will Cause Drowsiness 9)  Nystatin 100000 Unit/gm Powd (Nystatin) .... Apply To Affected Area 2-3x/day.  Disp 1 Large Container  Allergies (verified): 1)  ! Pcn 2)  ! 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 Apply to affected area 2-3x/day.  disp 1 large container  Problem # 2:  OBESITY (ICD-278.00) Assessment: New strongly encouraged pt to avoid her emotional eating.  applauded her exercise efforts.  stressed healthy food choices.  will continue to follow.  Complete Medication List: 1)  Keppra 500 Mg Tabs (Levetiracetam) .... 3 in am and 3 in pm 2)  Citracal  .... 1 in am 1 in pm 3)  Omega 3  .Marland Kitchen.. 1 in am 1 in pm 4)  Estrace 0.1 Mg/gm Crea (Estradiol) .... Use 3x/week 5)  Crestor 5 Mg Tabs (Rosuvastatin calcium) .... Take 1 tab by mouth daily 6)  Naprosyn 500 Mg Tabs (Naproxen) .... Take one tablet two times a day x7 days then as needed 7)  Nasonex 50 Mcg/act Susp (Mometasone furoate) .... 2 sprays each nostril once daily 8)  Cyclobenzaprine Hcl 10 Mg Tabs (Cyclobenzaprine hcl) .Marland Kitchen.. 1 by mouth 2 times daily as needed for back pain.  will cause drowsiness 9)  Nystatin 100000 Unit/gm Powd (Nystatin) .... Apply to affected area 2-3x/day.  disp 1 large container  Patient Instructions: 1)  Follow up for your physical in October, we'll check your weight at this time 2)  Make sure you are making  healthy food choices 3)  Avoid eating from stress, boredom, etc- eat when you are hungry 4)  Drink plenty of water 5)  Use the Nystatin powder on the area.  You can also use OTC Miconazole cream as needed 6)  Call with any questions or concerns 7)  Hang in there! Prescriptions: NYSTATIN 100000 UNIT/GM POWD (NYSTATIN) apply to affected area 2-3x/day.  disp 1 large container  #1 x 1   Entered and Authorized by:   Neena Rhymes MD   Signed by:   Neena Rhymes MD on 07/16/2009   Method used:   Electronically to        [facility]* (retail)       [address] [place], [place]  [address]       Ph: [phone] or [phone]       Fax: [phone]   RxID:   [id number]

## 2010-02-07 NOTE — Letter (Signed)
Summary: Pre Visit Letter Revised  [facility]  [address] [place], [place] [address]   Phone: [phone]  Fax: [phone]        11/27/2009 MRN: [id number]  [name] [address] [place], [place]  [address]             Procedure Date:  01-25-10 10am           Dr Juanda Chance   Welcome to the Gastroenterology Division at [facility].    You are scheduled to see a nurse for your pre-procedure visit on 01-11-10 at 9am on the 3rd floor at [facility], [address]  We ask that you try to arrive at our office 15 minutes prior to your appointment time to allow for check-in.  Please take a minute to review the attached form.  If you answer "Yes" to one or more of the questions on the first page, we ask that you call the person listed at your earliest opportunity.  If you answer "No" to all of the questions, please complete the rest of the form and bring it to your appointment.    Your nurse visit will consist of discussing your medical and surgical history, your immediate family medical history, and your medications.   If you are unable to list all of your medications on the form, please bring the medication bottles to your appointment and we will list them.  We will need to be aware of both prescribed and over the counter drugs.  We will need to know exact dosage information as well.    Please be prepared to read and sign documents such as consent forms, a financial agreement, and acknowledgement forms.  If necessary, and with your consent, a friend or relative is welcome to sit-in on the nurse visit with you.  Please bring your insurance card so that we may make a copy of it.  If your insurance requires a referral to see a specialist, please bring your referral form from your primary care physician.  No co-pay is required for this nurse visit.     If you cannot keep your appointment, please call [phone] to cancel or reschedule prior to your appointment  date.  This allows [place] the opportunity to schedule an appointment for another patient in need of care.    Thank you for choosing [facility] for your medical needs.  We appreciate the opportunity to care for you.  Please visit [place] at our website  to learn more about our practice.  Sincerely, The Gastroenterology Division

## 2010-02-07 NOTE — Progress Notes (Signed)
Summary: pain in back--wants Flexeril  Phone Note Call from Patient Call back at [phone]   Caller: Patient Summary of Call: patient says that her back has been hurting a lot since last night---want Dr Beverely Low to call in Flexeril for her--hung up before I could get pharmacy name Initial call taken by: Jerolyn Shin,  November 12, 2009 12:55 PM  Follow-up for Phone Call        Pt called back to give a different phone number [phone]. [facility]. Follow-up by: Lavell Islam,  November 12, 2009 12:58 PM  Additional Follow-up for Phone Call Additional follow up Details #1::        last OV 06-28-09, last filled 10-09-09. Pls advise...........Marland KitchenFelecia Deloach CMA  November 12, 2009 1:15 PM     Additional Follow-up for Phone Call Additional follow up Details #2::    ok for #30, no refills Follow-up by: Neena Rhymes MD,  November 12, 2009 1:33 PM  Additional Follow-up for Phone Call Additional follow up Details #3:: Details for Additional Follow-up Action Taken: pt aware rx sent to pharmacy............Marland KitchenFelecia Deloach CMA  November 12, 2009 3:16 PM   Prescriptions: CYCLOBENZAPRINE HCL 10 MG  TABS (CYCLOBENZAPRINE HCL) 1 by mouth 2 times daily as needed for back pain.  will cause drowsiness  #30 x 0   Entered by:   Jeremy Johann CMA   Authorized by:   Neena Rhymes MD   Signed by:   Jeremy Johann CMA on 11/12/2009   Method used:   Historical   RxID:   [id number]

## 2010-02-07 NOTE — Progress Notes (Signed)
Summary: feet cramp, pt on simvastatin/Dr Lowne see  Phone Note Call from Patient Call back at Work Phone [phone]   Caller: Patient Summary of Call: pt c/o both  feet cramps up at night from knees down to feet. pt is on Simvastatin, cpx was 10/16/08, just had Hepatic panel done 01/16/09.  Dr Laury Axon do you want to order CPK? Initial call taken by: Kandice Hams,  January 29, 2009 12:22 PM  Follow-up for Phone Call        yes ---stop zocor ---check cpk Follow-up by: Loreen Freud DO,  January 29, 2009 12:35 PM  Additional Follow-up for Phone Call Additional follow up Details #1::        pt informed and lab scheduled .Kandice Hams  January 29, 2009 2:11 PM  Additional Follow-up by: Kandice Hams,  January 29, 2009 2:11 PM

## 2010-02-07 NOTE — Miscellaneous (Signed)
Summary: previsit prep/rm  Clinical Lists Changes  Medications: Added new medication of MIRALAX   POWD (POLYETHYLENE GLYCOL 3350) As per prep  instructions. - Signed Added new medication of DULCOLAX 5 MG  TBEC (BISACODYL) Day before procedure take 2 at 3pm and 2 at 8pm. - Signed Added new medication of REGLAN 10 MG  TABS (METOCLOPRAMIDE HCL) As per prep instructions. - Signed Rx of MIRALAX   POWD (POLYETHYLENE GLYCOL 3350) As per prep  instructions.;  #255gm x 0;  Signed;  Entered by: Sherren Kerns RN;  Authorized by: Hart Carwin MD;  Method used: Electronically to [facility] [address], [place], [place]  [address], Ph: [phone] or [phone], Fax: [phone] Rx of DULCOLAX 5 MG  TBEC (BISACODYL) Day before procedure take 2 at 3pm and 2 at 8pm.;  #4 x 0;  Signed;  Entered by: Sherren Kerns RN;  Authorized by: Hart Carwin MD;  Method used: Electronically to [facility]*, [address], [place], [place]  [address], Ph: [phone] or [phone], Fax: [phone] Rx of REGLAN 10 MG  TABS (METOCLOPRAMIDE HCL) As per prep instructions.;  #2 x 0;  Signed;  Entered by: Sherren Kerns RN;  Authorized by: Hart Carwin MD;  Method used: Electronically to [facility]*, [address] [place], [place]  [address], Ph: [phone] or [phone], Fax: [phone] Allergies: Added new allergy or adverse reaction of FLEXERIL Added new allergy or adverse reaction of * METRONIDAZOLE VAGINAL GEL Observations: Added new observation of ALLERGY REV: Done (01/11/2010 8:31)    Prescriptions: REGLAN 10 MG  TABS (METOCLOPRAMIDE HCL) As per prep instructions.  #2 x 0   Entered by:   Sherren Kerns RN   Authorized by:   Hart Carwin MD   Signed by:   Sherren Kerns RN on 01/11/2010   Method used:   Electronically to        [facility]*  (retail)       [address] [place], [place]  [address]       Ph: [phone] or [phone]       Fax: [phone]   RxID:   [id number] DULCOLAX 5 MG  TBEC (BISACODYL) Day before procedure take 2 at 3pm and 2 at 8pm.  #4 x 0   Entered by:   Sherren Kerns RN   Authorized by:   Hart Carwin MD   Signed by:   Sherren Kerns RN on 01/11/2010   Method used:   Electronically to        [facility]* (retail)       [address] [place], [place]  [address]       Ph: [phone] or [phone]       Fax: [phone]   RxID:   [id number] MIRALAX   POWD (POLYETHYLENE GLYCOL 3350) As per prep  instructions.  #255gm x 0   Entered by:   Sherren Kerns RN   Authorized by:   Hart Carwin MD   Signed by:   Sherren Kerns RN on 01/11/2010   Method used:   Electronically to  [facility]* (retail)       [address] [place], [place]  [address]       Ph: [phone] or [phone]       Fax: [phone]   RxID:   [id number]

## 2010-02-07 NOTE — Assessment & Plan Note (Signed)
Summary: rto 1 month/cbs   Vital Signs:  Patient profile:   55 year old female Weight:      220 pounds BMI:     39.11 Pulse rate:   88 / minute BP sitting:   122 / 74  (left arm)  Vitals Entered By: Doristine Devoid CMA (December 25, 2009 9:37 AM) CC: 1 month roa    History of Present Illness: 56 yo woman here today for follow up on Cymbalta start.  reports mood has improved.  not much improvement in sleep.  no improvement in pain.  still having pain in hands, arms, and legs bilaterally.  pain tends to improve as the day goes on.  pt doesn't recall if pain improved on NSAIDs.  denies SI/HI.    Current Medications (verified): 1)  Keppra 500 Mg Tabs (Levetiracetam) .... 3 in Am and 3 in Pm 2)  Citracal .... 1 in Am 1 in Pm 3)  Omega 3 .Marland Kitchen.. 1 in Am 1 in Pm 4)  Estrace 0.1 Mg/gm Crea (Estradiol) .... Use 3x/week 5)  Crestor 5 Mg  Tabs (Rosuvastatin Calcium) .... Take 1 Tab By Mouth Daily 6)  Nasonex 50 Mcg/act Susp (Mometasone Furoate) .... 2 Sprays Each Nostril Once Daily 7)  Cymbalta 30 Mg Cpep (Duloxetine Hcl) .Marland Kitchen.. 1 Tab By Mouth Daily  Allergies (verified): 1)  ! Pcn 2)  ! Cipro 3)  ! * Methocarbamol  Review of Systems      See HPI  Physical Exam  General:  Overwt, well-developed,well-nourished,in no acute distress; alert,appropriate and cooperative throughout examination Msk:  + TTP over multiple trigger points on back, arms, legs Pulses:  +2 carotid, radial, DP Extremities:  No clubbing, cyanosis, edema, or deformity noted with normal full range of motion of all joints.   Psych:  Cognition and judgment appear intact. Alert and cooperative with normal attention span and concentration. No apparent delusions, illusions, hallucinations   Impression & Recommendations:  Problem # 1:  DEPRESSIVE DISORDER (ICD-311) Assessment Improved continue on cymbalta at this time.  if after another month pt's pain doesn't improve on the cymbalta will switch to generic SSRI due to  cost. Her updated medication list for this problem includes:    Cymbalta 30 Mg Cpep (Duloxetine hcl) .Marland Kitchen... 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  offered rheum referral- pt declined at this time.  will resume NSAIDs as needed for pain and follow closely. The following medications were removed from the medication list:    Naprosyn 500 Mg Tabs (Naproxen) .Marland Kitchen... Take one tablet two times a day x7 days then as needed Her updated medication list for this problem includes:    Naproxen 500 Mg Tabs (Naproxen) .Marland Kitchen... 1 tab by mouth two times a day as needed for pain  Orders: Venipuncture ([address]) T-Antinuclear Antib (ANA) [phone]) T-Rheumatoid Factor [phone]) T-Lyme Disease [phone]) Specimen Handling ([address]) TLB-Sedimentation Rate (ESR) (85652-ESR) TLB-CK Total Only(Creatine Kinase/CPK) (82550-CK)  Complete Medication List: 1)  Keppra 500 Mg Tabs (Levetiracetam) .... 3 in am and 3 in pm 2)  Citracal  .... 1 in am 1 in pm 3)  Omega 3  .Marland Kitchen.. 1 in am 1 in pm 4)  Estrace 0.1 Mg/gm Crea (Estradiol) .... Use 3x/week 5)  Crestor 5 Mg Tabs (Rosuvastatin calcium) .... Take 1 tab by mouth daily 6)  Nasonex 50 Mcg/act Susp (Mometasone furoate) .... 2 sprays each nostril once daily 7)  Cymbalta 30  Mg Cpep (Duloxetine hcl) .Marland Kitchen.. 1 tab by mouth daily 8)  Naproxen 500 Mg Tabs (Naproxen) .Marland Kitchen.. 1 tab by mouth two times a day as needed for pain  Patient Instructions: 1)  We'll notify you of your lab results- if there are any abnormalities we'll refer you to Rheumatology 2)  Use the Naproxen if the pain doesn't improve as the day goes on 3)  Continue to use a heating pad for pain relief 4)  Continue the Cymbalta for your mood- I'm glad it's better! 5)  Call with any questions or concerns 6)  Happy Holidays! Prescriptions: NAPROXEN 500 MG TABS (NAPROXEN) 1 tab by mouth two times a day as needed for pain  #60 x 1    Entered and Authorized by:   Neena Rhymes MD   Signed by:   Neena Rhymes MD on 12/25/2009   Method used:   Electronically to        [facility]* (retail)       [address] [place], [place]  [address]       Ph: [phone] or [phone]       Fax: [phone]   RxID:   [id number]    Orders Added: 1)  Venipuncture [36415] 2)  T-Antinuclear Antib (ANA) [27253-66440] 3)  T-Rheumatoid Factor [phone] 4)  T-Lyme Disease [87564-33295] 5)  Specimen Handling [99[address]] 6)  TLB-Sedimentation Rate (ESR) [85652-ESR] 7)  TLB-CK Total Only(Creatine Kinase/CPK) [82550-CK] 8)  Est. Patient Level III [[address]]

## 2010-02-07 NOTE — Assessment & Plan Note (Signed)
Summary: 6 MTH FU/NS/KDC   Vital Signs:  Patient profile:   55 year old female Weight:      195 pounds Pulse rate:   72 / minute BP sitting:   126 / 80  (left arm)  Vitals Entered By: Doristine Devoid (April 09, 2009 11:26 AM) CC: 6 month roa-not fasting   History of Present Illness: 55 yo woman here today for f/u on cholesterol.    pt reports feeling well.  stopped Simvastatin due to muscle aches after resuming meds.  last took meds 2 weeks ago.  CK never elevated.  had discussed switching to Crestor at previous visit.  would like to do that now.  had cranberry juice this AM.  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  Pt following with [facility].  Preventive Screening-Counseling & Management  Alcohol-Tobacco     Alcohol drinks/day: 0     Smoking Status: never      Sexual History:  currently monogamous.        Drug Use:  never.    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 3 in Am and 3 in Pm 2)  Citracal .... 1 in Am 1 in Pm 3)  Omega 3 .Marland Kitchen.. 1 in Am 1 in Pm 4)  Estrace 0.1 Mg/gm Crea (Estradiol) .... Use 3x/week 5)  Crestor 5 Mg  Tabs (Rosuvastatin Calcium) .... Take 1 Tab By Mouth Daily  Allergies (verified): 1)  ! Pcn 2)  ! Cipro  Past History:  Past Medical History: Last updated: 09/18/2008 brain cancer, frontal lobe, type unknown--1993 surgery  reoccured 2005 surgery 2005 [facility] morton's neuroma of R foot seizures  Review of Systems      See HPI  Physical Exam  General:  Overwt, well-developed,well-nourished,in no acute distress; alert,appropriate and cooperative throughout examination Lungs:  Normal respiratory effort, chest expands symmetrically. Lungs  are clear to auscultation, no crackles or wheezes. Heart:  Normal rate and regular rhythm. S1 and S2 normal without gallop, murmur, click, rub or other extra sounds. Abdomen:  Bowel sounds positive,abdomen soft and non-tender Pulses:  +2 carotid, radial, DP Extremities:  No clubbing, cyanosis, edema    Impression & Recommendations:  Problem # 1:  HYPERLIPIDEMIA (ICD-272.4) Assessment Unchanged switch to Crestor in hopes of eliminating myalgias.  Check lipids to get baseline and assess level of reduction needed.  repeat LFTs in 6-8 weeks.  Pt expresses understanding and is in agreement w/ this plan. The following medications were removed from the medication list:    Simvastatin 40 Mg Tabs (Simvastatin) .Marland Kitchen... Take one tablet at bedtime Her updated medication list for this problem includes:    Crestor 5 Mg Tabs (Rosuvastatin calcium) .Marland Kitchen... Take 1 tab by mouth daily  Orders: Venipuncture ([address]) TLB-Lipid Panel (80061-LIPID) TLB-Hepatic/Liver Function Pnl (80076-HEPATIC)  Complete Medication List: 1)  Keppra 500 Mg Tabs (Levetiracetam) .... 3 in am and 3 in pm 2)  Citracal  .... 1 in am 1 in pm 3)  Omega 3  .Marland Kitchen.. 1 in am 1 in pm 4)  Estrace 0.1 Mg/gm Crea (Estradiol) .... Use 3x/week 5)  Crestor 5 Mg Tabs (Rosuvastatin calcium) .... Take 1 tab by mouth daily  Patient Instructions: 1)  Schedule a  lab visit in 6-8 weeks 2)  Start the Crestor daily 3)  If you have problems with your new medicine, please call 4)  Schedule your complete physical any time after 10/16/09 5)  Happy Easter!! Prescriptions: ESTRACE 0.1 MG/GM CREA (ESTRADIOL) use 3x/week  #1 month x 3   Entered by:   Doristine Devoid   Authorized by:   Neena Rhymes MD   Signed by:   Doristine Devoid on 04/09/2009   Method used:   Electronically to        [facility]* (retail)       [address] [place], [place]  [address]       Ph: [phone] or [phone]        Fax: [phone]   RxID:   [id number] CRESTOR 5 MG  TABS (ROSUVASTATIN CALCIUM) Take 1 tab by mouth daily  #30 x 3   Entered and Authorized by:   Neena Rhymes MD   Signed by:   Neena Rhymes MD on 04/09/2009   Method used:   Electronically to        [facility]* (retail)       [address] [place], [place]  [address]       Ph: [phone] or [phone]       Fax: [phone]   RxID:   [id number]

## 2010-02-07 NOTE — Miscellaneous (Signed)
Summary: labs from [facility]   Clinical Lists Changes  Observations: Added new observation of TSH: 1.293 microintl units/mL (04/17/2009 15:10) Added new observation of BILI TOTAL: 0.5 mg/dL (08/65/7846 96:29) Added new observation of ALK PHOS: 63 units/L (04/17/2009 15:10) Added new observation of SGPT (ALT): 15 units/L (04/17/2009 15:10) Added new observation of SGOT (AST): 20 units/L (04/17/2009 15:10) Added new observation of PROTEIN, TOT: 6.7 g/dL (52/84/1324 40:10) Added new observation of ALBUMIN: 3.8 g/dL (27/25/3664 40:34) Added new observation of CALCIUM: 10.1 mg/dL (74/25/9563 87:56) Added new observation of GLUCOSE SER: 93 mg/dL (43/32/9518 84:16) Added new observation of CREATININE: 0.9 mg/dL (60/63/0160 10:93) Added new observation of BUN: 14 mg/dL (23/55/7322 02:54) Added new observation of CO2 TOTAL: 29 mmol/L (04/17/2009 15:10) Added new observation of CHLORIDE: 109 mmol/L (04/17/2009 15:10) Added new observation of POTASSIUM: 4.6 mmol/L (04/17/2009 15:10) Added new observation of SODIUM: 144 mmol/L (04/17/2009 15:10)

## 2010-02-07 NOTE — Progress Notes (Signed)
Summary: reaction to med  Phone Note Refill Request Call back at Home Phone [phone] Message from:  Patient  Refills Requested: Medication #1:  CYCLOBENZAPRINE HCL 10 MG  TABS 1 by mouth 2 times daily as needed for back pain.  will cause drowsiness Pt states that med is causing her feet to itch uncontrollably. Pt would like to change to another med. Pt uses [facility]. Pls advise ....Marland KitchenMarland KitchenFelecia Deloach CMA  November 14, 2009 11:01 AM    Follow-up for Phone Call        can switch to methocarbamol 1000mg  three times a day as needed for pain.  #60, no refills Follow-up by: Neena Rhymes MD,  November 14, 2009 11:25 AM  Additional Follow-up for Phone Call Additional follow up Details #1::        med only comes in methocarbamol 750mg  or 500mg  which would you prefer pt take..........Marland KitchenFelecia Deloach CMA  November 14, 2009 12:12 PM     Additional Follow-up for Phone Call Additional follow up Details #2::    can take 1-2 tabs of the 500mg  three times a day as needed Follow-up by: Neena Rhymes MD,  November 14, 2009 12:45 PM  Additional Follow-up for Phone Call Additional follow up Details #3:: Details for Additional Follow-up Action Taken: Pt aware, rx sent to pharmacy............Marland KitchenFelecia Deloach CMA  November 14, 2009 1:18 PM   New/Updated Medications: METHOCARBAMOL 500 MG TABS (METHOCARBAMOL) Take 1-2 tabs three times a day as needed Prescriptions: METHOCARBAMOL 500 MG TABS (METHOCARBAMOL) Take 1-2 tabs three times a day as needed  #90 x 0   Entered by:   Jeremy Johann CMA   Authorized by:   Neena Rhymes MD   Signed by:   Jeremy Johann CMA on 11/14/2009   Method used:   Faxed to ...       [facility]* (retail)       [address] [place], [place]  [address]       Ph: [phone] or [phone]       Fax: [phone]   RxID:   [id number]

## 2010-02-07 NOTE — Procedures (Signed)
Summary: Colonoscopy  Patient: [name] Note: All result statuses are Final unless otherwise noted.  Tests: (1) Colonoscopy (COL)   COL Colonoscopy           DONE     [facility]     [address]     [place], [place]  [address]           COLONOSCOPY PROCEDURE REPORT           PATIENT:  Leslie, Ellis  MR#:  [id number]     BIRTHDATE:  1955/01/25, 54 yrs. old  GENDER:  female     ENDOSCOPIST:  Hedwig Morton. Juanda Chance, MD     REF. BY:  Neena Rhymes, M.D.     PROCEDURE DATE:  01/31/2010     PROCEDURE:  Colonoscopy 19[address]     ASA CLASS:  Class II     INDICATIONS:  colorectal cancer screening, average risk     MEDICATIONS:   Versed 10 mg, Fentanyl 100 mcg           DESCRIPTION OF PROCEDURE:   After the risks benefits and     alternatives of the procedure were thoroughly explained, informed     consent was obtained.  Digital rectal exam was performed and     revealed no rectal masses.   The LB 180AL [id number] endoscope was     introduced through the anus and advanced to the cecum, which was     identified by both the appendix and ileocecal valve, without     limitations.  The quality of the prep was good, using MiraLax.     The instrument was then slowly withdrawn as the colon was fully     examined.     <<PROCEDUREIMAGES>>           FINDINGS:  No polyps or cancers were seen (see image1, image2,     image4, image3, image5, and image6).   Retroflexed views in the     rectum revealed no abnormalities.    The scope was then withdrawn     from the patient and the procedure completed.           COMPLICATIONS:  None     ENDOSCOPIC IMPRESSION:     1) No polyps or cancers     2) Normal colonoscopy     RECOMMENDATIONS:     1) high fiber diet     REPEAT EXAM:  In 10 year(s) for.           ______________________________     Hedwig Morton. Juanda Chance, MD           CC:           n.     eSIGNED:   Hedwig Morton. Brodie at 01/31/2010 12:21 PM           [name], [name], [phone]  Note: An exclamation  mark (!) indicates a result that was not dispersed into the flowsheet. Document Creation Date: 01/31/2010 12:22 PM _______________________________________________________________________  (1) Order result status: Final Collection or observation date-time: 01/31/2010 12:17 Requested date-time:  Receipt date-time:  Reported date-time:  Referring Physician:   Ordering Physician: Lina Sar [phone]) Specimen Source:  Source: [name] Order Number: [id number] Lab site:   Appended Document: Colonoscopy    Clinical Lists Changes  Observations: Added new observation of COLONNXTDUE: 01/2020 (01/31/2010 16:19)

## 2010-02-07 NOTE — Assessment & Plan Note (Signed)
Summary: vaginal itch/burning/rash/cbs   Vital Signs:  Patient profile:   55 year old female Weight:      209 pounds Temp:     98.5 degrees F oral BP sitting:   120 / 80  (left arm)  Vitals Entered By: Doristine Devoid CMA (August 27, 2009 1:27 PM) CC: vaginal itching and some burning   History of Present Illness: 55 yo woman here today for similar sxs as last visit.  + itching, burning, redness.  sxs initially improved w/ Nystatin powder but then returned.  has been having increased sweating.  has been using powder regularly.  no vaginal d/c.  + burning w/ urination but pt thinks this is external.  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 3 in Am and 3 in Pm 2)  Citracal .... 1 in Am 1 in Pm 3)  Omega 3 .Marland Kitchen.. 1 in Am 1 in Pm 4)  Estrace 0.1 Mg/gm Crea (Estradiol) .... Use 3x/week 5)  Crestor 5 Mg  Tabs (Rosuvastatin Calcium) .... Take 1 Tab By Mouth Daily 6)  Naprosyn 500 Mg Tabs (Naproxen) .... Take One Tablet Two Times A Day X7 Days Then As Needed 7)  Nasonex 50 Mcg/act Susp (Mometasone Furoate) .... 2 Sprays Each Nostril Once Daily 8)  Cyclobenzaprine Hcl 10 Mg  Tabs (Cyclobenzaprine Hcl) .Marland Kitchen.. 1 By Mouth 2 Times Daily As Needed For Back Pain.  Will Cause Drowsiness 9)  Nystatin 100000 Unit/gm Powd (Nystatin) .... Apply To Affected Area 2-3x/day.  Disp 1 Large Container  Allergies (verified): 1)  ! Pcn 2)  ! Cipro  Review of Systems      See HPI  Physical Exam  General:  Overwt, well-developed,well-nourished,in no acute distress; alert,appropriate and cooperative throughout  examination Skin:  labia majora w/ erythematous rash w/ satellite lesions, L>R   Impression & Recommendations:  Problem # 1:  FUNGAL DERMATITIS (ICD-111.9) Assessment Unchanged pt's sxs are similar to last time.  initial improvement w/ nystatin powder.  will start miconazole cream for itching and burning.  UA (-) for infxn.  burning most likely due to urinating over open skin.  reviewed supportive care and red flags that should prompt return.  Pt expresses understanding and is in agreement w/ this plan. Her updated medication list for this problem includes:    Nystatin 100000 Unit/gm Powd (Nystatin) .Marland Kitchen... Apply to affected area 2-3x/day.  disp 1 large container  Complete Medication List: 1)  Keppra 500 Mg Tabs (Levetiracetam) .... 3 in am and 3 in pm 2)  Citracal  .... 1 in am 1 in pm 3)  Omega 3  .Marland Kitchen.. 1 in am 1 in pm 4)  Estrace 0.1 Mg/gm Crea (Estradiol) .... Use 3x/week 5)  Crestor 5 Mg Tabs (Rosuvastatin calcium) .... Take 1 tab by mouth daily 6)  Naprosyn 500 Mg Tabs (Naproxen) .... Take one tablet two times a day x7 days then as needed 7)  Nasonex 50 Mcg/act Susp (Mometasone furoate) .... 2 sprays each nostril once daily 8)  Cyclobenzaprine Hcl 10 Mg Tabs (Cyclobenzaprine hcl) .Marland Kitchen.. 1 by mouth 2 times daily as needed  for back pain.  will cause drowsiness 9)  Nystatin 100000 Unit/gm Powd (Nystatin) .... Apply to affected area 2-3x/day.  disp 1 large container  Other Orders: UA Dipstick w/o Micro (manual) ([address])  Patient Instructions: 1)  This appears to be yeast on the skin 2)  Continue the powder as needed for the creases but apply miconazole cream to the bothersome area two times a day. 3)  If no improvement in the next 7 days- please call. 4)  Call with any questions or concerns 5)  Hang in there!!!  Laboratory Results   Urine Tests    Routine Urinalysis   Glucose: negative   (Normal Range: Negative) Bilirubin: negative   (Normal Range: Negative) Ketone: negative    (Normal Range: Negative) Spec. Gravity: 1.015   (Normal Range: 1.003-1.035) Blood: negative   (Normal Range: Negative) pH: 6.0   (Normal Range: 5.0-8.0) Protein: negative   (Normal Range: Negative) Urobilinogen: 0.2   (Normal Range: 0-1) Nitrite: negative   (Normal Range: Negative) Leukocyte Esterace: negative   (Normal Range: Negative)

## 2010-02-07 NOTE — Letter (Signed)
Summary: [facility]  [facility]   Imported By: Lanelle Bal 03/16/2009 09:11:51  _____________________________________________________________________  External Attachment:    Type:   Image     Comment:   External Document

## 2010-02-07 NOTE — Progress Notes (Signed)
Summary: -Rash  Phone Note Call from Patient Call back at Home Phone [phone]   Caller: Patient Summary of Call: Patient called in and said that she is still having trouble with the rash. Last time she was here she said you had you could call her in something to take by mouth and she would like that know. Please advise.   Pharmacy: [facility] Initial call taken by: Harold Barban,  September 21, 2009 11:35 AM  Follow-up for Phone Call        diflucan 100mg  daily x5 day.  continue the nystatin powder and miconazle creams.  needs to hold crestor while taking diflucan Follow-up by: Neena Rhymes MD,  September 21, 2009 11:50 AM  Additional Follow-up for Phone Call Additional follow up Details #1::        left message to call office ...............Marland KitchenFelecia Deloach CMA  September 21, 2009 1:44 PM   discuss with patient, rx sent to pharmacy..........Marland KitchenFelecia Deloach CMA  September 21, 2009 2:39 PM     New/Updated Medications: DIFLUCAN 100 MG TABS (FLUCONAZOLE) Take 1 tab daily for 5 day *hold crestor while on diflucan* Prescriptions: DIFLUCAN 100 MG TABS (FLUCONAZOLE) Take 1 tab daily for 5 day *hold crestor while on diflucan*  #5 x 0   Entered by:   Jeremy Johann CMA   Authorized by:   Neena Rhymes MD   Signed by:   Jeremy Johann CMA on 09/21/2009   Method used:   Faxed to ...       [facility]* (retail)       [address] [place], [place]  [address]       Ph: [phone] or [phone]       Fax: [phone]   RxID:   [id number]

## 2010-02-07 NOTE — Progress Notes (Signed)
Summary: labs-  Phone Note Outgoing Call   Call placed by: Doristine Devoid,  April 10, 2009 2:21 PM Call placed to: Patient Summary of Call: pt's cholesterol is very high.  has been off cholesterol meds due to myalgias.  just started Crestor 5mg , will see how pt tolerates before titrating meds.  will follow closely.  remainder of labs look good.   Follow-up for Phone Call        left message on machine .........Marland KitchenDoristine Devoid  April 10, 2009 2:21 PM   spoke w/ patient aware of labs and information.....Marland KitchenMarland KitchenDoristine Devoid  April 18, 2009 4:16 PM

## 2010-02-13 NOTE — Progress Notes (Addendum)
Summary: med concerns  Phone Note Refill Request Call back at Home Phone [phone]   Refills Requested: Medication #1:  CYMBALTA 30 MG CPEP 1 tab by mouth daily Pt would like to get a cheaper med. Pt would also like to inform dr Beverely Low that she is still bother with pains in hand and arms.Marland KitchenMarland KitchenMarland KitchenFelecia Deloach CMA  February 07, 2010 11:11 AM     Follow-up for Phone Call        can switch from Cymbalta to Citalopram 20mg  daily ($4 at [facility] and [facility]).  next step in treatment of arm pains is referral to rheumatologist.  she declined this at last visit.  please ask if she is willing at this time. Follow-up by: Neena Rhymes MD,  February 07, 2010 11:17 AM  Additional Follow-up for Phone Call Additional follow up Details #1::        Pt aware Rx sent to pharmacy, Pt still decline referral at this time will speak with husband and give [place] a call back if she wants referral..Marland KitchenMarland KitchenFelecia Deloach CMA  February 07, 2010 11:33 AM     New/Updated Medications: CITALOPRAM HYDROBROMIDE 20 MG TABS (CITALOPRAM HYDROBROMIDE) take one tablet by mouth daily Prescriptions: CITALOPRAM HYDROBROMIDE 20 MG TABS (CITALOPRAM HYDROBROMIDE) take one tablet by mouth daily  #30 x 3   Entered and Authorized by:   Neena Rhymes MD   Signed by:   Neena Rhymes MD on 02/07/2010   Method used:   Electronically to        [facility]* (retail)       [address] [place], [place]  [address]       Ph: [phone] or [phone]       Fax: [phone]   RxID:   [id number]   Appended Document: Orders Update Pt return call would like to proceed with referral, awaiting appt info   Clinical Lists Changes  Orders: Added new Referral order of Rheumatology Referral (Rheumatology) - Signed

## 2010-03-11 ENCOUNTER — Other Ambulatory Visit: Payer: Self-pay | Admitting: Family Medicine

## 2010-03-11 ENCOUNTER — Encounter: Payer: Self-pay | Admitting: Family Medicine

## 2010-03-11 DIAGNOSIS — Z1231 Encounter for screening mammogram for malignant neoplasm of breast: Secondary | ICD-10-CM

## 2010-03-15 ENCOUNTER — Telehealth: Payer: Self-pay | Admitting: Family Medicine

## 2010-03-19 NOTE — Progress Notes (Signed)
Summary: refill  Phone Note Refill Request Call back at Home Phone [phone]   Refills Requested: Medication #1:  CRESTOR 5 MG  TABS Take 1 tab by mouth daily Pt requesting call back would like have Rx sent in for 10 mg. Pt uses [facility]..Felecia Deloach CMA  March 15, 2010 10:22 AM    Follow-up for Phone Call        ok to change to 10mg , #90, 3 refills Follow-up by: Neena Rhymes MD,  March 15, 2010 10:42 AM    New/Updated Medications: CRESTOR 10 MG TABS (ROSUVASTATIN CALCIUM) 1 by mouth once daily Prescriptions: CRESTOR 10 MG TABS (ROSUVASTATIN CALCIUM) 1 by mouth once daily  #90 x 3   Entered by:   Lucious Groves CMA   Authorized by:   Neena Rhymes MD   Signed by:   Lucious Groves CMA on 03/15/2010   Method used:   Faxed to ...       [facility] (mail-order)             , [place]         Ph: [phone]       Fax: [phone]   RxID:   [id number]

## 2010-03-21 ENCOUNTER — Telehealth: Payer: Self-pay | Admitting: Family Medicine

## 2010-03-25 ENCOUNTER — Telehealth: Payer: Self-pay | Admitting: Family Medicine

## 2010-03-26 NOTE — Letter (Signed)
Summary: [facility]  [facility]   Imported By: Maryln Gottron 03/20/2010 11:25:36  _____________________________________________________________________  External Attachment:    Type:   Image     Comment:   External Document

## 2010-03-26 NOTE — Progress Notes (Signed)
Summary: back pain ongoing  Phone Note Call from Patient Call back at Home Phone [phone]   Caller: Patient Summary of Call: Pt left VM c/o of back pain again and would like to have Rx sent to pharmacy. Pt states that it is ongoing mid back pain. Pt denies any recent injury to back. Pt notes that she has been [place] for this [place] and was Rx flexeril which she found out that she was allergic to. Pt requesting a med that she has not had a reaction to.Pt uses [facility]. Pls advise....Marland KitchenMarland KitchenFelecia Deloach CMA  March 21, 2010 12:06 PM   Follow-up for Phone Call        should follow up w/ rheumatology as directed.  can start Soma for muscle pain, Naprosyn as needed for inflammation Follow-up by: Neena Rhymes MD,  March 21, 2010 12:13 PM  Additional Follow-up for Phone Call Additional follow up Details #1::        Pt aware....Marland KitchenMarland KitchenFelecia Deloach CMA  March 21, 2010 12:27 PM     New/Updated Medications: SOMA 350 MG TABS (CARISOPRODOL) 1 tab by mouth three times a day as needed for pain Prescriptions: SOMA 350 MG TABS (CARISOPRODOL) 1 tab by mouth three times a day as needed for pain  #45 x 0   Entered and Authorized by:   Neena Rhymes MD   Signed by:   Neena Rhymes MD on 03/21/2010   Method used:   Electronically to        [facility]* (retail)       [address] [place], [place]  [address]       Ph: [phone] or [phone]       Fax: [phone]   RxID:   [id number]

## 2010-03-27 LAB — DIFFERENTIAL
Basophils Absolute: 0 10*3/uL (ref 0.0–0.1)
Basophils Relative: 1 % (ref 0–1)
Eosinophils Absolute: 0.1 10*3/uL (ref 0.0–0.7)
Neutro Abs: 3.4 10*3/uL (ref 1.7–7.7)
Neutrophils Relative %: 63 % (ref 43–77)

## 2010-03-27 LAB — CBC
Hemoglobin: 12.6 g/dL (ref 12.0–15.0)
MCHC: 34 g/dL (ref 30.0–36.0)
MCV: 93 fL (ref 78.0–100.0)
RBC: 4 MIL/uL (ref 3.87–5.11)
RDW: 13.5 % (ref 11.5–15.5)

## 2010-03-27 LAB — PROTIME-INR
INR: 1.1 (ref 0.00–1.49)
Prothrombin Time: 14.1 seconds (ref 11.6–15.2)

## 2010-03-27 LAB — COMPREHENSIVE METABOLIC PANEL
ALT: 18 U/L (ref 0–35)
Alkaline Phosphatase: 67 U/L (ref 39–117)
BUN: 12 mg/dL (ref 6–23)
CO2: 27 mEq/L (ref 19–32)
GFR calc non Af Amer: >60 mL/min (ref >60)
Glucose, Bld: 88 mg/dL (ref 70–99)
Potassium: 3.9 mEq/L (ref 3.5–5.1)
Sodium: 137 mEq/L (ref 135–145)

## 2010-03-27 LAB — GLUCOSE, CAPILLARY: Glucose-Capillary: 84 mg/dL (ref 70–99)

## 2010-03-27 LAB — CK TOTAL AND CKMB (NOT AT ARMC): Relative Index: INVALID (ref 0.0–2.5)

## 2010-04-04 NOTE — Progress Notes (Signed)
Summary: Back Pain-No Better  Phone Note Call from Patient Call back at [phone]   Caller: Patient Summary of Call: Message left on Triage voicemail: Patient with ongoing back pain, med is not working. Patient would like a call back with recommendations    Shonna Chock CMA  March 25, 2010 1:59 PM   Follow-up for Phone Call        Please advise. Lucious Groves CMA  March 25, 2010 2:19 PM   Additional Follow-up for Phone Call Additional follow up Details #1::        rheumatology wanted to see her 3 weeks after her 03/11/10 appt which should be later this week (or early next).  if the meds are not working we have the option of physical therapy or pain management referral or she can call rheumatology and see if they have any suggestions. Additional Follow-up by: Neena Rhymes MD,  March 25, 2010 2:27 PM    Additional Follow-up for Phone Call Additional follow up Details #2::    Patient notified and will call rheum. Lucious Groves CMA  March 25, 2010 3:44 PM

## 2010-04-05 ENCOUNTER — Encounter: Payer: Self-pay | Admitting: Family Medicine

## 2010-04-05 ENCOUNTER — Ambulatory Visit (INDEPENDENT_AMBULATORY_CARE_PROVIDER_SITE_OTHER): Payer: 59 | Admitting: Family Medicine

## 2010-04-05 VITALS — BP 118/70 | Temp 98.0°F | Ht 62.0 in | Wt 227.4 lb

## 2010-04-05 DIAGNOSIS — M797 Fibromyalgia: Secondary | ICD-10-CM

## 2010-04-05 DIAGNOSIS — IMO0001 Reserved for inherently not codable concepts without codable children: Secondary | ICD-10-CM

## 2010-04-05 MED ORDER — MILNACIPRAN HCL 50 MG PO TABS: 1.0000 | ORAL_TABLET | Freq: Two times a day (BID) | ORAL | Status: DC

## 2010-04-05 NOTE — Patient Instructions (Signed)
Please follow up in 3-4 weeks to check your new medication START the Savella as we talked about STOP the Celexa Try and get daily exercise/activity- this will improve your pain Call with any questions or concerns I am very hopeful that this will help you! Hang in there!

## 2010-04-05 NOTE — Assessment & Plan Note (Signed)
Pt has seen rheum x2 and Dr Nickola Major agrees that pt most likely has fibromyalgia.  Will start Savella- titrate dose over 4 weeks rather than 2.  Stop Celexa. Stressed the importance of healthy diet, regular exercise.  Will follow closely.

## 2010-04-05 NOTE — Progress Notes (Signed)
  Subjective:    Patient ID: [name], female    DOB: 12-Nov-1955, 55 y.o.   MRN: [id number]  HPI Myalgias- pt has seen rheum x2 w/ negative work up.  Was started on prednisone to determine whether she had PMR and this made no improvement.  Dr Nickola Major agrees that this is likely fibromyalgia.  Pt's mom has hx of severe fibromyalgia and is on Savella and reports this is helping.  Pain is 'all over.  It just hurts'.  'arms, shoulders, legs'.  Admits to poor sleep.  Minimal exercise 'b/c i don't feel like doing it'.   Review of Systems For ROS see HPI     Objective:   Physical Exam  Constitutional: She appears well-developed and well-nourished.  Musculoskeletal:       + TTP over multiple trigger points  Psychiatric:       Flat affect          Assessment & Plan:

## 2010-04-29 ENCOUNTER — Encounter: Payer: Self-pay | Admitting: Family Medicine

## 2010-04-29 ENCOUNTER — Ambulatory Visit (INDEPENDENT_AMBULATORY_CARE_PROVIDER_SITE_OTHER): Payer: 59 | Admitting: Family Medicine

## 2010-04-29 VITALS — BP 130/88 | Temp 98.8°F | Wt 233.1 lb

## 2010-04-29 DIAGNOSIS — IMO0001 Reserved for inherently not codable concepts without codable children: Secondary | ICD-10-CM

## 2010-04-29 DIAGNOSIS — M797 Fibromyalgia: Secondary | ICD-10-CM

## 2010-04-29 NOTE — Progress Notes (Signed)
  Subjective:    Patient ID: [name], female    DOB: Apr 23, 1955, 55 y.o.   MRN: [id number]  HPI Chronic pain- 'i'm not doing a whole lot better'.  'i do ok for part of the morning but by the time lunch hits, i need to lie down and take a nap.  When i get up i can't do anything'.  Husband is w/ pt today and says that in 34 yrs together he has never seen her in this much pain.  Taking Savella regularly.  Just got up to full dose last week.  Mother has hx of severe fibromyalgia.  We've discussed pain management in the past- pt now open to this idea.   Review of Systems  For ROS see HPI     Objective:   Physical Exam  Constitutional: She appears well-developed and well-nourished. No distress.  Psychiatric:       Flat affect, doesn't appear in pain          Assessment & Plan:

## 2010-04-29 NOTE — Patient Instructions (Signed)
Continue the Savella- call me if you need a prescription (if your mom's meds are the wrong dose) We'll notify you of your pain appt Call with any questions or concerns Hang in there!!!

## 2010-04-29 NOTE — Assessment & Plan Note (Signed)
Continue Savella at full dose.  Refer to pain management for evaluation and tx of chronic daily pain.

## 2010-04-30 ENCOUNTER — Ambulatory Visit: Payer: 59 | Admitting: Family Medicine

## 2010-04-30 ENCOUNTER — Ambulatory Visit: Payer: Self-pay

## 2010-05-01 ENCOUNTER — Encounter: Payer: Self-pay | Admitting: Family Medicine

## 2010-05-01 ENCOUNTER — Other Ambulatory Visit: Payer: Self-pay | Admitting: *Deleted

## 2010-05-01 MED ORDER — NAPROXEN 500 MG PO TABS: 500.0000 mg | ORAL_TABLET | Freq: Two times a day (BID) | ORAL | Status: DC | PRN

## 2010-05-01 NOTE — Telephone Encounter (Signed)
How many refills for this pt?

## 2010-05-01 NOTE — Telephone Encounter (Signed)
Sent by MD. 

## 2010-05-07 ENCOUNTER — Ambulatory Visit
Admission: RE | Admit: 2010-05-07 | Discharge: 2010-05-07 | Disposition: A | Discharge status: Home / Self Care | Payer: 59 | Source: Ambulatory Visit | Attending: Family Medicine | Admitting: Family Medicine

## 2010-05-07 DIAGNOSIS — Z1231 Encounter for screening mammogram for malignant neoplasm of breast: Secondary | ICD-10-CM

## 2010-05-24 NOTE — H&P (Signed)
[name], [name]                  ACCOUNT NO.:  [id number]   MEDICAL RECORD NO.:  [id number]          PATIENT TYPE:  INP   LOCATION:  1831                         FACILITY:  [facility]   PHYSICIAN:  Madaline Savage, MD        DATE OF BIRTH:  05-04-1955   DATE OF ADMISSION:  08/22/2005  DATE OF DISCHARGE:                                HISTORY & PHYSICAL   CHIEF COMPLAINT:  Seizure.   HISTORY OF PRESENT ILLNESS:  [name] is a 55 year old Caucasian lady with a  history of left frontal lobectomy in the past and a history of seizure  disorder since 2001, who comes in after an episode of seizures. She says she  has been seizure free for the last 6 months. She has been taking her seizure  medications properly. At approximately 7:00 o'clock this evening, she had a  episode of dizziness and started to seize. She states the whole body was  seizing and she loss consciousness. When she regained consciousness, she was  confused and she felt weak after it. She felt more weak in her legs than her  arms. There were no other proceeding signs for her seizure, other than the  dizziness. She did not have any headache at that time but now complains of a  headache. She did not bite her tongue. She did not have any frothing. She  fell down during the seizure and since then, she has been having weakness  and numbness in both her legs, left more than the right. Since she came into  the emergency room, she is feeling a little bit better. She was seen by the  neurologist, who ordered a MRI. She also had a CT head, which is  unremarkable.   PAST MEDICAL HISTORY:  Seizure disorder since 2001. She has been on multiple  medications in the past. She has been seizure free for the last 6 months.   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 She states she had 2 other surgeries on her brain in 2005.   ALLERGIES:  PENICILLIN (causes a rash.)   SOCIAL HISTORY:  No history of  smoking, alcohol, or drug abuse.   FAMILY HISTORY:  Her mother had diabetes.   REVIEW OF SYSTEMS:  GENERAL:  No recent weight loss or weigh gain. No fever,  chills. HEENT:  No headaches, no blurred vision.  CARDIOVASCULAR:  Denies any chest pain, palpitations. RESPIRATORY:  No  shortness of breath or cough.  GENITOURINARY:  No abdominal pain. No changes in bowel habits.   PHYSICAL EXAMINATION:  GENERAL:  Alert and oriented times three.  VITAL SIGNS:  Temperature 98.1, pulse 77, blood pressure 131/84, respiratory  rate of 20. Oxygen saturation 96% on room air.  HEENT:  Normocephalic and atraumatic. Pupils are equal, round, and reactive  to light. Moist mucous membranes.  NECK:  Supple. No JVD. No carotid bruit.  CARDIAC:  S1 and S2. Regular rate and rhythm.  CHEST:  Clear to auscultation.  ABDOMEN:  Soft. Bowel sounds heard.  EXTREMITIES:  No clubbing, cyanosis, or edema.  NEUROLOGIC:  She definitely has some weakness in her lower extremities, left  greater than the right. Reflexes are intact bilaterally.   HOME MEDICATIONS:  1. Vicodin 1 to 2 tablets every 6 hours as needed.  2. Keppra 1500 mg twice daily.  3. Lamictal 40 mg twice daily.  4. Lyrica 100 mg three times daily.  5. Phenergan as needed.  6. Calcium plus vitamin D.   LABORATORY DATA:  Hemoglobin 14.3, sodium 138, potassium 4.7, chloride 108,  BUN 12, glucose 91. She had a urinalysis which showed moderate leukocytes, 3  to 6 white blood cells, nitrite negative. Leukocytes were moderate.   CT head showed postoperative changes. No hemorrhagic or mass effect. MRI of  the lumbar, thoracic, and cervical spine were negative.   IMPRESSION:  1. Seizure disorder.  2. Lower extremity weakness.  3. Possible urinary tract infection.   PLAN:  1. This is a 55 year old lady who had an episode of seizure. She does have      a history of seizure disorder. She is on Keppra and Lamictal. Will      continue the same. Neurology has  already seen the patient. They      recommend continuing on the same medications. They also recommend      getting an MRI in the morning, which I will order.  2. Lower extremity weakness. 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 She had a MRI of the lumbar spine, which was      negative. Will get PT/OT on her.  3. Possible urinary tract infection. Her UA shows moderate leukocytes but      only 3 to 6 white blood cells. Will put her on Ciprofloxacin by mouth.      I will also check some baseline labs on her.      Madaline Savage, MD  Electronically Signed     PKN/MEDQ  D:  08/22/2005  T:  08/22/2005  Job:  [id number]

## 2010-05-24 NOTE — Discharge Summary (Signed)
[name], [name]                  ACCOUNT NO.:  [id number]   MEDICAL RECORD NO.:  [id number]          PATIENT TYPE:  INP   LOCATION:  3714                         FACILITY:  [facility]   PHYSICIAN:  Jonna L. Robb Matar, M.D.DATE OF BIRTH:  1955/07/20   DATE OF ADMISSION:  08/22/2005  DATE OF DISCHARGE:  08/24/2005                                 DISCHARGE SUMMARY   FINAL DIAGNOSES:  Dictation ended at this point.      Jonna L. Robb Matar, M.D.     Dorna Bloom  D:  11/01/2005  T:  11/02/2005  Job:  [id number]

## 2010-05-24 NOTE — Consult Note (Signed)
NAMESHANEICE, BARSANTI NO.:  [id number]   MEDICAL RECORD NO.:  [id number]          PATIENT TYPE:  EMS   LOCATION:  MAJO                         FACILITY:  [facility]   PHYSICIAN:  Bevelyn Buckles. Champey, M.D.DATE OF BIRTH:  09-14-1955   DATE OF CONSULTATION:  08/22/2005  DATE OF DISCHARGE:                                   CONSULTATION   REASON FOR CONSULTATION:  Seizure with lower extremity weakness.   HISTORY OF PRESENT ILLNESS:  Ms. Fort is a 55 year old Caucasian female with  past medical history of seizure disorder with status post left frontal  lobectomy secondary to tumor, who presents to the ED after seizure with loss  of consciousness and fall.  The patient was in her normal state of health  this morning at work when she felt dizzy, fell on the floor, loss of  consciousness.  Seizure was unwitnessed.  She had no tongue biting or  incontinence.  Afterwards, the patient was tired and could not get up,  stating her lower extremities were weak and numb, and states she fell  backwards.  Her left leg was greater than her right.  States with her usual  seizures has difficulty to speech and altered mental status; however, she  did have a similar larger seizure than in the past.  The patient did have  loss of consciousness for about 45 seconds.  She still complains of slight  headache.  She denies any back pain, neck pain, vision changes, speech  changes, change in bladder function, change in bowel function, vertigo,  swallowing problems or tremor problems.   PAST MEDICAL HISTORY:  Positive for epilepsy and migraines.   CURRENT MEDICATIONS:  Lamictal, Keppra, Lyrica, Fiorinal, promethazine  p.r.n.   ALLERGIES:  The patient has drug allergies to PENICILLIN, which causes a  rash.   FAMILY HISTORY:  Positive for diabetes and back problems.   SOCIAL HISTORY:  The patient lives with her husband.  Denies tobacco,  alcohol or drug use.   REVIEW OF SYSTEMS:  Positive as  per above.  Remainder of review systems  negative as per HPI and greater than 8 systems.   PHYSICAL EXAMINATION:  VITAL SIGNS: Temperature is 98.1, blood pressure is  131/84, pulse is 77, respirations 20, O2 sat is 96%.  GENERAL: The patient is awake, alert and oriented x3.  Language is fluent.  HEENT: Normocephalic, atraumatic.  Extraocular muscles are intact.  Pupils  equal, round, and reactive to light.  NECK: Supple.  No carotid bruits.  HEART: Regular.  LUNGS: Clear.  ABDOMEN: Soft, nontender.  EXTREMITIES: Show no edema.  NEUROLOGICAL: The patient is awake, alert and oriented.  Language is fluent.  Memory and knowledge are within normal limits.  Cranial nerves II-XII are  grossly intact.  MOTOR: The patient has 5/5 strength bilaterally in her upper extremities,  with normal tone in her upper extremities.  The patient has subtly increased  tone in bilateral lower extremities, 0 to 1 over 5 in her left proximal  lower extremity, and 4 to 4+ over 5 in her distal left  lower extremity.  The  patient has 5/5 strength in bilateral knee adduction, and 3 to 4 over 5  strength in bilateral knee extension.  The patient has 3 to 4 over 5  strength in right proximal lower extremity, and 4 4+ over 5 strength in  right distal lower extremity.  SENSORY: Within normal limits to light touch, and pin prick refluxes are 2+  in bilateral upper extremities, 2 to 3+ bilateral lower extremities.  Toes  are neutral bilaterally.  Cerebellar function is within normal limits,  finger-to-nose.  Gait is not assessed secondary to safety.   CT of the head showed left frontal postop changes with no acute blood.  MRI  of the entire spine was essentially normal with slight L5-S1 disk.   LABORATORY DATA:  Hemoglobin 14.3, hematocrit is 42.1, sodium is 138,  potassium is 4.7, chloride is 108, BUN 12, creatinine 0.9, glucose 91.   IMPRESSION:  1. A 55 year old Caucasian female with past medical history of  seizures      followed up at [facility], presents with a seizure status post      fall, and now complains of some lower extremity weakness.  On exam, MRI      and CT are reviewed, and do not give a clear explanation for her      weakness.  Her upper extremity strength is good.  The patient will need      physical and occupational therapy for strengthening and gait training.      Seizures seemed to be under good control until today.  We will adjust      her medications by increasing her Lyrica to 150 mg t.i.d.  We will      continue her Keppra and Lyrica to previous doses.  We will consider      getting an MRI of the brain in the morning.  2. Also, the patient seems to have a urinary tract infection with moderate      leukocyte esterase and small blood in her urine.  I am recommending to      continue treating that with antibiotics.  So I recommend again TSH,      B12, RPR, ESR, folate and CK.  Will follow up with the patient as      consultants.      Bevelyn Buckles. Nash Shearer, M.D.  Electronically Signed     DRC/MEDQ  D:  08/22/2005  T:  08/23/2005  Job:  [id number]

## 2010-05-27 ENCOUNTER — Other Ambulatory Visit: Payer: Self-pay | Admitting: *Deleted

## 2010-05-27 DIAGNOSIS — M797 Fibromyalgia: Secondary | ICD-10-CM

## 2010-05-27 MED ORDER — MILNACIPRAN HCL 50 MG PO TABS: 1.0000 | ORAL_TABLET | Freq: Two times a day (BID) | ORAL | Status: DC

## 2010-05-27 NOTE — Telephone Encounter (Signed)
Rx sent and pt notified.

## 2010-05-31 ENCOUNTER — Other Ambulatory Visit: Payer: Self-pay | Admitting: Family Medicine

## 2010-05-31 DIAGNOSIS — M797 Fibromyalgia: Secondary | ICD-10-CM

## 2010-05-31 MED ORDER — MILNACIPRAN HCL 50 MG PO TABS: 1.0000 | ORAL_TABLET | Freq: Two times a day (BID) | ORAL | Status: DC

## 2010-05-31 NOTE — Telephone Encounter (Signed)
Rx resent.

## 2010-06-04 ENCOUNTER — Telehealth: Payer: Self-pay | Admitting: *Deleted

## 2010-06-04 MED ORDER — TRAMADOL HCL 50 MG PO TABS: 50.0000 mg | ORAL_TABLET | Freq: Four times a day (QID) | ORAL | Status: DC | PRN

## 2010-06-04 NOTE — Telephone Encounter (Signed)
Ultram 50mg   #60  1 po every 6 hours prn

## 2010-06-04 NOTE — Telephone Encounter (Signed)
Patient made aware of Rx and it has been faxed to the pharmacy     KP

## 2010-06-04 NOTE — Telephone Encounter (Signed)
Pt c/o increase pain due to fibromyalgia. Pt notes that her appt with pain clinic was not until June and the [facility] does help some but not all the time. Pt is requesting another med to help with pain.Please advise

## 2010-06-21 ENCOUNTER — Other Ambulatory Visit: Payer: Self-pay | Admitting: *Deleted

## 2010-06-21 ENCOUNTER — Encounter: Payer: 59 | Attending: Physical Medicine and Rehabilitation | Admitting: Physical Medicine and Rehabilitation

## 2010-06-21 DIAGNOSIS — M25549 Pain in joints of unspecified hand: Secondary | ICD-10-CM

## 2010-06-21 DIAGNOSIS — M545 Low back pain, unspecified: Secondary | ICD-10-CM | POA: Insufficient documentation

## 2010-06-21 DIAGNOSIS — F341 Dysthymic disorder: Secondary | ICD-10-CM | POA: Insufficient documentation

## 2010-06-21 DIAGNOSIS — G894 Chronic pain syndrome: Secondary | ICD-10-CM

## 2010-06-21 DIAGNOSIS — IMO0001 Reserved for inherently not codable concepts without codable children: Secondary | ICD-10-CM | POA: Insufficient documentation

## 2010-06-21 DIAGNOSIS — M79609 Pain in unspecified limb: Secondary | ICD-10-CM | POA: Insufficient documentation

## 2010-06-21 DIAGNOSIS — G40909 Epilepsy, unspecified, not intractable, without status epilepticus: Secondary | ICD-10-CM | POA: Insufficient documentation

## 2010-06-21 MED ORDER — TRAMADOL HCL 50 MG PO TABS: 50.0000 mg | ORAL_TABLET | Freq: Four times a day (QID) | ORAL | Status: DC | PRN

## 2010-06-21 NOTE — Telephone Encounter (Signed)
Ok for #60, no refills 

## 2010-06-21 NOTE — Telephone Encounter (Signed)
Last refilled on 06/04/10. Please advise.

## 2010-06-21 NOTE — Telephone Encounter (Signed)
Refill sent.

## 2010-06-22 NOTE — Progress Notes (Signed)
[name] is a pleasant 55 year old woman who was kindly referred by Dr. Beverely Low.  She is accompanied today by her husband.  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  This is per their verbal history. She has had brain operations times 3 one in 1993, 2 in 2005, Dr. Wyline Mood and Dr. Ferrel Logan.  She has had seizure disorder as well.  She is currently on Keppra for this.  Her chief complaint is "I hurt everywhere and" even the soles of her feet hurt her.  I asked her which areas are the biggest problems for her, and she tells me that her hands every morning when she wakes up are painful for her and she has trouble using them.  She also has some complaints of left calf pain which comes on at night. She indicates that she has some mid thoracic to lower lumbar back pain which aggravates her as well.  Her average pain is about an 8 on a scale of 10.  Her pain is described as stabbing, aching, and tingling in nature interfering significantly with activity levels.  Sleep is fair.  Pain is worse with walking and standing in the low back, improves with rest, heat, and medication.  She is getting fair relief with current meds.  Her husband tells me that her energy levels and pain seems to have been worse in the last 3 or 4 months.  She has been started on Savella about 2 months ago and tramadol within the last month.  She tells me with these 2 medications they do give her some relief and in fact when her tramadol wears off she notices an increase in her leg pain.  FUNCTIONAL STATUS:  She can climb stairs.  She does not drive due to her history of seizures.  She is independent with self-care.  REVIEW OF SYSTEMS:  Denies problems controlling bowel or bladder.  She admits to occasional weakness, numbness, tremors, tingling, spasms, confusion,  depression, and anxiety.  Denies suicidal ideation.  Also occasionally reports night sweats and weight loss.  She reports that her past medical history is significant for brain cancer status post 3 surgeries, hypercholesterolemia, chronic myalgias, migraine headaches, seizure history since 2001.  ALLERGY:  PENICILLIN, rash.  CIPRO makes her to throw up.  FLEXERIL causes itching.  METRONIDAZOLE causes itching.  METHOCARBAMOL causes itching.  MEDICATIONS:  She currently brings in today include Levetiracetam, Crestor, Q-Sorb Q10, Savella, Naproxen, tramadol, aspirin/butalbital/caffeine, promethazine, Citracal, Omega fish oil, Prilosec, esterase, melatonin, Valerian root and Tylenol.  PHYSICAL EXAMINATION:  VITAL SIGNS:  Her blood pressure is 138/74, pulse 93, respirations 18, 97% saturated on room air. GENERAL:  She is an obese woman who does not appear in any distress. She is oriented x3.  Speech is clear.  Affect is bright.  She is alert, cooperative, and pleasant.  Follows commands without difficulty. Answers my questions appropriately.  Cranial nerves and coordination are grossly intact.  Her reflexes are brisk in both upper and lower extremities.  No clonus is appreciated.  Hoffmann sign is negative.  Her motor strength is overall quite good both upper and lower extremities without obvious focal weakness.  Sensation is intact in upper and lower extremities with pinprick, light touch, and vibratory sensation.  Transitioning from sitting to standing is done slowly.  Her gait is stable.  She has some difficulty with tandem  gait, Romberg test is performed adequately, rotation, flexion, and extension at her neck does not aggravate pain.  She has full shoulder range of motion.  Lumbar motion is fairly well maintained some discomfort with extension.  Internal and external rotation at the hips aggravates her medial thigh. She reports pain with both flexion.  She reports pain with  external and internal rotation at the hips in the medial thigh area.  She has some medial joint line tenderness in the left knee.  No crepitus is appreciated.  She does not have an obvious effusion.  Her lower extremity pulses are intact.  Her feet are warm.  No edema is appreciated.  Her upper extremities are evaluated further.  She gets some referred pain to the elbow with Phalen test.  Tinel sign is negative, however, no obvious thenar wasting is appreciated.  She has multiple areas of tenderness throughout the parascapular musculature, lateral hips, low back, tender points throughout upper as well as lower extremities are appreciated.  IMPRESSION: 1. Hand pain etiology not clear.  We will obtain cervical radiographs     as well as set her up for her EMG nerve conduction studies. 2. Low back pain/left lower extremity pain, may be related to lumbar     spondylosis.  We will consider further workup in the upcoming     months. 3. Fibromyalgia overlay. 4. History of brain tumor status post resection. 5. Seizure history. 6. Depression/anxiety.  PLAN:  We will obtain cervical radiographs as well as nerve conduction studies and EMG bilateral upper extremities.  The patient reports some improvement of pain using tramadol in addition to [place].  We will obtain urine drug screen prior to writing prescription for any pain medications at this time.  I will see her back in the next month.  I have answered all her questions.  She is comfortable with our workup and current treatment plan.     Brantley Stage, M.D. Electronically Signed    DMK/MedQ D:  06/21/2010 15:13:01  T:  06/22/2010 05:01:17  Job #:  [id number]

## 2010-06-24 ENCOUNTER — Ambulatory Visit (HOSPITAL_COMMUNITY)
Admission: RE | Admit: 2010-06-24 | Discharge: 2010-06-24 | Disposition: A | Discharge status: Home / Self Care | Payer: 59 | Source: Ambulatory Visit | Attending: Physical Medicine and Rehabilitation | Admitting: Physical Medicine and Rehabilitation

## 2010-06-24 ENCOUNTER — Other Ambulatory Visit: Payer: Self-pay | Admitting: Physical Medicine and Rehabilitation

## 2010-06-24 DIAGNOSIS — M79643 Pain in unspecified hand: Secondary | ICD-10-CM

## 2010-06-24 DIAGNOSIS — M542 Cervicalgia: Secondary | ICD-10-CM

## 2010-06-24 DIAGNOSIS — M79609 Pain in unspecified limb: Secondary | ICD-10-CM | POA: Insufficient documentation

## 2010-06-24 DIAGNOSIS — IMO0001 Reserved for inherently not codable concepts without codable children: Secondary | ICD-10-CM | POA: Insufficient documentation

## 2010-06-26 ENCOUNTER — Ambulatory Visit: Payer: 59 | Admitting: Physical Medicine and Rehabilitation

## 2010-07-11 ENCOUNTER — Other Ambulatory Visit: Payer: Self-pay | Admitting: Family Medicine

## 2010-07-11 MED ORDER — TRAMADOL HCL 50 MG PO TABS: 50.0000 mg | ORAL_TABLET | Freq: Four times a day (QID) | ORAL | Status: DC | PRN

## 2010-07-25 ENCOUNTER — Other Ambulatory Visit: Payer: Self-pay | Admitting: Family Medicine

## 2010-07-25 MED ORDER — ESTRADIOL 0.1 MG/GM VA CREA: 2.0000 g | TOPICAL_CREAM | VAGINAL | Status: DC

## 2010-07-25 NOTE — Telephone Encounter (Signed)
Refill sent.

## 2010-08-02 ENCOUNTER — Encounter: Payer: 59 | Attending: Physical Medicine and Rehabilitation | Admitting: Physical Medicine and Rehabilitation

## 2010-08-02 DIAGNOSIS — G40909 Epilepsy, unspecified, not intractable, without status epilepticus: Secondary | ICD-10-CM | POA: Insufficient documentation

## 2010-08-02 DIAGNOSIS — F341 Dysthymic disorder: Secondary | ICD-10-CM | POA: Insufficient documentation

## 2010-08-02 DIAGNOSIS — M545 Low back pain, unspecified: Secondary | ICD-10-CM | POA: Insufficient documentation

## 2010-08-02 DIAGNOSIS — M79609 Pain in unspecified limb: Secondary | ICD-10-CM | POA: Insufficient documentation

## 2010-08-02 DIAGNOSIS — Z79899 Other long term (current) drug therapy: Secondary | ICD-10-CM | POA: Insufficient documentation

## 2010-08-02 DIAGNOSIS — R209 Unspecified disturbances of skin sensation: Secondary | ICD-10-CM

## 2010-08-02 DIAGNOSIS — IMO0001 Reserved for inherently not codable concepts without codable children: Secondary | ICD-10-CM | POA: Insufficient documentation

## 2010-08-26 ENCOUNTER — Encounter: Payer: 59 | Attending: Physical Medicine and Rehabilitation | Admitting: Physical Medicine and Rehabilitation

## 2010-08-26 DIAGNOSIS — M545 Low back pain, unspecified: Secondary | ICD-10-CM | POA: Insufficient documentation

## 2010-08-26 DIAGNOSIS — C719 Malignant neoplasm of brain, unspecified: Secondary | ICD-10-CM | POA: Insufficient documentation

## 2010-08-26 DIAGNOSIS — IMO0001 Reserved for inherently not codable concepts without codable children: Secondary | ICD-10-CM | POA: Insufficient documentation

## 2010-08-26 DIAGNOSIS — F341 Dysthymic disorder: Secondary | ICD-10-CM | POA: Insufficient documentation

## 2010-08-26 DIAGNOSIS — R209 Unspecified disturbances of skin sensation: Secondary | ICD-10-CM | POA: Insufficient documentation

## 2010-08-26 DIAGNOSIS — G8929 Other chronic pain: Secondary | ICD-10-CM | POA: Insufficient documentation

## 2010-08-26 DIAGNOSIS — M79609 Pain in unspecified limb: Secondary | ICD-10-CM | POA: Insufficient documentation

## 2010-08-26 DIAGNOSIS — M542 Cervicalgia: Secondary | ICD-10-CM | POA: Insufficient documentation

## 2010-08-26 DIAGNOSIS — G40802 Other epilepsy, not intractable, without status epilepticus: Secondary | ICD-10-CM | POA: Insufficient documentation

## 2010-08-26 DIAGNOSIS — Z79899 Other long term (current) drug therapy: Secondary | ICD-10-CM | POA: Insufficient documentation

## 2010-08-27 NOTE — Assessment & Plan Note (Signed)
[name] is a pleasant 55 year old woman who has a history of a multiple brain tumor surgeries for glioma.  She is seen [facility] at [facility] for chronic pain complaints related to bilateral arm pain and lower extremity tingling.  She recently underwent EMG nerve conduction studies of bilateral median and ulnar nerves, which were in normal limits.  She indicates her average pain is about a 8 on a scale of 10.  Pain is rather constant. In the lower extremities, pain is rather aggravating than painful and occurs about every 2-3 minutes 2-3 times an hour.  She has been using hydrocodone at night for the forearm pain.  It seems to help.  Sleep is fair.  Pain is worse with activities, improves with rest and medication.  She gets a little relief with current meds.  She has been using hydrocodone twice a day 5/325 and occasional Mobic.  Per primary care, she also was on Savella and naproxen as well as tramadol, and Keppra for seizures.  FUNCTIONAL STATUS:  She reports she has very little energy for walking. She is able to climb stairs.  She does not drive.  She is independent with self-care, needs assistance with heavier household tasks.  She denies problems controlling bowel or bladder.  She admits to some weakness, numbness, tingling, depression, and anxiety.  Denies suicidal ideation.  She maintains contact with primary care physician, Dr. Beverely Low, Dr. Johny Drilling, and Dr. Tera Helper as well for her cancer.  No other change in past medical, social, or family history since last visit.  PHYSICAL EXAMINATION:  Blood pressure is 132/76, pulse 100, respirations 16, 99% saturated on room air.  She is a well-developed, well-nourished woman who does not appear in any distress.  She is oriented x3.  Speech is clear.  Her affect is alert.  She is cooperative and pleasant. Follows commands without difficulty, answers my questions appropriately. She has brisk reflexes  in both upper extremities.  Hoffmann sign is positive on the right, negative on the left.  No clonus is appreciated. Reflexes are 2+ in patellar and Achilles tendon.  She has relatively well-preserved strength in both upper and lower extremities. Transitioning from sitting to standing is done with ease.  Her gait is non-antalgic.  She has difficulty with tandem gait, but is able to perform Romberg test.  IMPRESSION: 1. Forearm pain may be neuropathic related.  Nerve conduction studies     are negative for median or ulnar nerve neuropathy. 2. Chronic low back pain with tingling in both lower extremities. 3. Fibromyalgia overlay. 4. History of brain tumor status post resection. 5. Seizure history. 6. Depression/anxiety. 7. Cervicalgia with upper extremity pain.  PLAN:  We will obtain MRI of her neck with and without contrast.  We would like to have her decrease her tramadol as Savella in combination with tramadol increases risk of increased serotonin syndrome.  She already has a history of seizures.  We will increase her hydrocodone to 4 times today.  We would also like to pursue physical therapy to work on balance and prevent falls.  She is comfortable with our treatment plan.  I have suggested she decrease her tramadol from 4 times a day to 3 times a day next week and then twice a day the week after.  Also, I am wondering if Crestor may be playing a role in her pain.  She believes it came on about a year ago and approximately when she started her Crestor  as well, she has multiple pain complaints.  I am not sure if this is actually playing a role or not, but we will consider.  I will see her back in a month.  I have answered all her questions.  She is comfortable with our plan at this time.     Brantley Stage, M.D. Electronically Signed    DMK/MedQ D:  08/26/2010 13:46:04  T:  08/27/2010 00:06:29  Job #:  [id number]

## 2010-08-30 ENCOUNTER — Ambulatory Visit (INDEPENDENT_AMBULATORY_CARE_PROVIDER_SITE_OTHER): Payer: 59 | Admitting: Family Medicine

## 2010-08-30 DIAGNOSIS — G43909 Migraine, unspecified, not intractable, without status migrainosus: Secondary | ICD-10-CM | POA: Insufficient documentation

## 2010-08-30 MED ORDER — PROMETHAZINE HCL 25 MG/ML IJ SOLN
25.0000 mg | Freq: Once | INTRAMUSCULAR | Status: AC
  Administered 2010-08-30: 25 mg via INTRAMUSCULAR

## 2010-08-30 MED ORDER — KETOROLAC TROMETHAMINE 60 MG/2ML IM SOLN
60.0000 mg | Freq: Once | INTRAMUSCULAR | Status: AC
  Administered 2010-08-30: 60 mg via INTRAMUSCULAR

## 2010-08-30 MED ORDER — PROMETHAZINE HCL 25 MG PO TABS: 25.0000 mg | ORAL_TABLET | Freq: Four times a day (QID) | ORAL | Status: DC | PRN

## 2010-08-30 MED ORDER — KETOROLAC TROMETHAMINE 15.75 MG/SPRAY NA SOLN: 1.0000 | Freq: Four times a day (QID) | NASAL | Status: DC | PRN

## 2010-08-30 NOTE — Progress Notes (Signed)
  Subjective:    Patient ID: [name], female    DOB: [birth year], 55 y.o.   MRN: [id number]  HPI HA- went to UC yesterday to get a shot for migraine.  No toradol available, got shot of phenergan.  Took oxycontin x3 w/out relief.  Pt here today for toradol injxn.  Has taken triptans previously 'none of them work'.  Dr Barbee Shropshire is arranging for pt to have MRI of neck.  HA is L frontal at site of tumor.  Pt has had migraines since she was a teen.  Migraine started 2 days ago.  No vomiting but + nausea.  No visual changes.  No focal weakness or numbness.  Reports this is typical of her migraines.   Review of Systems For ROS see HPI     Objective:   Physical Exam  Vitals reviewed. Constitutional: She is oriented to person, place, and time. She appears well-developed and well-nourished. No distress.  HENT:  Head: Normocephalic and atraumatic.  Eyes: Conjunctivae and EOM are normal. Pupils are equal, round, and reactive to light.  Neck: Normal range of motion. Neck supple.  Neurological: She is alert and oriented to person, place, and time. She has normal reflexes. No cranial nerve deficit. Coordination normal.  Psychiatric: She has a normal mood and affect. Her behavior is normal.          Assessment & Plan:

## 2010-08-30 NOTE — Patient Instructions (Signed)
Use the nasal spray as needed for migraine pain- no more than 5 days in a row 1 spray in each nostril every 6-8 hours as needed for migraine pain- store the rest in the fridge Call with any questions or concerns Hang in there!

## 2010-09-02 ENCOUNTER — Other Ambulatory Visit: Payer: Self-pay | Admitting: Physical Medicine and Rehabilitation

## 2010-09-02 DIAGNOSIS — C801 Malignant (primary) neoplasm, unspecified: Secondary | ICD-10-CM

## 2010-09-03 NOTE — Assessment & Plan Note (Signed)
Pt reports sxs are typical for her migraines.  No red flags on hx or PE.  Known brain tumor.  toradol and phenergan given in office w/ script for both to have at home in case of persistent sxs.  Reviewed supportive care and red flags that should prompt return.  Pt expressed understanding and is in agreement w/ plan.

## 2010-09-05 ENCOUNTER — Ambulatory Visit (HOSPITAL_COMMUNITY)
Admission: RE | Admit: 2010-09-05 | Discharge: 2010-09-05 | Disposition: A | Discharge status: Home / Self Care | Payer: 59 | Source: Ambulatory Visit | Attending: Physical Medicine and Rehabilitation | Admitting: Physical Medicine and Rehabilitation

## 2010-09-05 DIAGNOSIS — M79609 Pain in unspecified limb: Secondary | ICD-10-CM | POA: Insufficient documentation

## 2010-09-05 DIAGNOSIS — Z85841 Personal history of malignant neoplasm of brain: Secondary | ICD-10-CM | POA: Insufficient documentation

## 2010-09-05 DIAGNOSIS — M542 Cervicalgia: Secondary | ICD-10-CM | POA: Insufficient documentation

## 2010-09-05 DIAGNOSIS — C801 Malignant (primary) neoplasm, unspecified: Secondary | ICD-10-CM

## 2010-09-05 DIAGNOSIS — Z923 Personal history of irradiation: Secondary | ICD-10-CM | POA: Insufficient documentation

## 2010-09-05 MED ORDER — GADOBENATE DIMEGLUMINE 529 MG/ML IV SOLN
20.0000 mL | Freq: Once | INTRAVENOUS | Status: AC | PRN
  Administered 2010-09-05: 20 mL via INTRAVENOUS

## 2010-09-13 ENCOUNTER — Encounter: Payer: 59 | Attending: Physical Medicine and Rehabilitation | Admitting: Physical Medicine and Rehabilitation

## 2010-09-13 DIAGNOSIS — IMO0002 Reserved for concepts with insufficient information to code with codable children: Secondary | ICD-10-CM

## 2010-09-13 DIAGNOSIS — F329 Major depressive disorder, single episode, unspecified: Secondary | ICD-10-CM | POA: Insufficient documentation

## 2010-09-13 DIAGNOSIS — R279 Unspecified lack of coordination: Secondary | ICD-10-CM

## 2010-09-13 DIAGNOSIS — G894 Chronic pain syndrome: Secondary | ICD-10-CM

## 2010-09-13 DIAGNOSIS — M545 Low back pain, unspecified: Secondary | ICD-10-CM | POA: Insufficient documentation

## 2010-09-13 DIAGNOSIS — IMO0001 Reserved for inherently not codable concepts without codable children: Secondary | ICD-10-CM | POA: Insufficient documentation

## 2010-09-13 DIAGNOSIS — M542 Cervicalgia: Secondary | ICD-10-CM | POA: Insufficient documentation

## 2010-09-13 DIAGNOSIS — F3289 Other specified depressive episodes: Secondary | ICD-10-CM | POA: Insufficient documentation

## 2010-09-13 DIAGNOSIS — M79609 Pain in unspecified limb: Secondary | ICD-10-CM | POA: Insufficient documentation

## 2010-09-13 DIAGNOSIS — F411 Generalized anxiety disorder: Secondary | ICD-10-CM | POA: Insufficient documentation

## 2010-09-14 NOTE — Assessment & Plan Note (Signed)
[name] is a pleasant 55 year old woman who has had multiple brain surgeries over the last several years.  Her initial surgeries were back in 1994 or 1995.  She has had two surgeries in 2005.  She is followed over at [facility] by Dr. Rolene Arbour as well as Dr. Johny Drilling who is radiation oncologist.  She is back in today for a brief evaluation.  She underwent elective diagnostic studies earlier this fall which were essentially normal.  She continues to have persistent stabbing pain, burning pain in bilateral upper extremities and lower extremities, averaging about 9 on a scale of 10.  This came on about 8 months ago.  Average pain is about a 9 on a scale of 10.  Sleep is fair.  She is really not sure what brings it on, seems to improve with rest and medication.  She can walk about 10 minutes.  She has difficulty with stairs, does not drive.  She is independent with self-care.  She does report some problems with her balance, tingling, depression, and anxiety.  Denies suicidal ideation. Denies problems controlling bowel or bladder.  Review of systems is otherwise negative.  No changes in past medical, social, or family history.  On exam, her blood pressure is 144/83, pulse 102, respirations 18, 94% saturated on room air.  She is a mildly obese woman who does not appear in any distress.  She is oriented x3.  Speech is clear.  Affect is bright.  She is alert, cooperative and pleasant.  Follows commands without difficulty answers my questions appropriately.  Cranial nerves coordination are grossly intact.  Reflexes are slightly brisk in the upper and lower extremities.  No abnormal tone or clonus is noted. Motor strength is good in both upper and lower extremities.  She transitions relatively easily from sitting to standing.  Gait is not antalgic.  She has some difficulty with tandem gait but is able to perform Romberg test.  IMPRESSION: 1. Pain in bilateral upper and  lower extremities stocking-glove     distribution.  Nerve conduction studies negative for neuropathy. 2. Chronic low back pain. 3. History of fibromyalgia. 4. History of brain tumor status post multiple resections. 5. Seizure history. 6. Depression/anxiety. 7. Cervicalgia.  Recent MRI was reviewed with her.  This was done on September 06, 2010. Essentially there was no acute or metastatic findings in the cervical spine.  She had mild for age cervical spine degeneration, mild neural foraminal stenosis was noted at the right C4 and right C5 nerve.  Etiology for her stocking-glove upper and lower extremity pain is not clear.  May consider further follow up with neurologist for this. Small fiber neuropathy is in the differtial diagnosis.  There are many causes for this.  May look into this further and consider some blood work to rule out some of the more common etiologies. This would include  DM, thyroid problems, vitamin deficiency.  I will go ahead and start her on some gabapentin.  I reviewed the risks and benefits of this medication. I will start her on a small dose if she tolerates this without problems. We will consider increasing dose.  She is comfortable with this plan.  I will see her back in a month.  I have answered all her questions.     Brantley Stage, M.D. Electronically Signed    DMK/MedQ D:  09/13/2010 14:42:10  T:  09/13/2010 23:52:27  Job #:  [id number]

## 2010-09-23 ENCOUNTER — Ambulatory Visit: Payer: 59 | Admitting: Physical Medicine and Rehabilitation

## 2010-09-26 ENCOUNTER — Other Ambulatory Visit: Payer: Self-pay | Admitting: Family Medicine

## 2010-09-26 DIAGNOSIS — M797 Fibromyalgia: Secondary | ICD-10-CM

## 2010-09-26 MED ORDER — MILNACIPRAN HCL 50 MG PO TABS: 50.0000 mg | ORAL_TABLET | Freq: Two times a day (BID) | ORAL | Status: DC

## 2010-09-26 NOTE — Telephone Encounter (Signed)
Done

## 2010-09-30 ENCOUNTER — Other Ambulatory Visit: Payer: Self-pay | Admitting: Family Medicine

## 2010-09-30 MED ORDER — TRAMADOL HCL 50 MG PO TABS: 50.0000 mg | ORAL_TABLET | Freq: Four times a day (QID) | ORAL | Status: DC | PRN

## 2010-09-30 NOTE — Telephone Encounter (Signed)
Done

## 2010-10-14 ENCOUNTER — Encounter: Payer: 59 | Attending: Physical Medicine and Rehabilitation | Admitting: Physical Medicine and Rehabilitation

## 2010-10-14 DIAGNOSIS — F329 Major depressive disorder, single episode, unspecified: Secondary | ICD-10-CM | POA: Insufficient documentation

## 2010-10-14 DIAGNOSIS — R209 Unspecified disturbances of skin sensation: Secondary | ICD-10-CM

## 2010-10-14 DIAGNOSIS — F411 Generalized anxiety disorder: Secondary | ICD-10-CM | POA: Insufficient documentation

## 2010-10-14 DIAGNOSIS — M25569 Pain in unspecified knee: Secondary | ICD-10-CM | POA: Insufficient documentation

## 2010-10-14 DIAGNOSIS — M79609 Pain in unspecified limb: Secondary | ICD-10-CM

## 2010-10-14 DIAGNOSIS — G43909 Migraine, unspecified, not intractable, without status migrainosus: Secondary | ICD-10-CM | POA: Insufficient documentation

## 2010-10-14 DIAGNOSIS — R279 Unspecified lack of coordination: Secondary | ICD-10-CM

## 2010-10-14 DIAGNOSIS — F3289 Other specified depressive episodes: Secondary | ICD-10-CM | POA: Insufficient documentation

## 2010-10-14 DIAGNOSIS — M542 Cervicalgia: Secondary | ICD-10-CM | POA: Insufficient documentation

## 2010-10-14 DIAGNOSIS — IMO0001 Reserved for inherently not codable concepts without codable children: Secondary | ICD-10-CM | POA: Insufficient documentation

## 2010-10-14 DIAGNOSIS — G40909 Epilepsy, unspecified, not intractable, without status epilepticus: Secondary | ICD-10-CM | POA: Insufficient documentation

## 2010-10-15 NOTE — Assessment & Plan Note (Signed)
[name] is a pleasant 55 year old woman who is followed here at our [facility].  She has complex pain complaints.  She has a long history of migraine headaches dating back to her teenage years.  She has a history of a brain tumor and is status post multiple resections.  She has chronic pain in both upper and lower extremities, which began approximately in January 2012.  She describes this pain in her extremities as burning, stabbing, aching, tingling.  It is about 10 on a scale of 10 in the upper extremities.  Lower extremities are about 7 on a scale of 10.  She also has some mild left knee pain, which is not as problematic as the limb pain that she is discussing.  Pain is typically worse.  Pain is really not aggravated by any particular activity, it is rather constant.  Medication does seem to help.  She has been using hydrocodone 4 times a day, tramadol twice a day.  She is also on Keppra for seizures and I recently started her on gabapentin 100 mg 4 times a day.  FUNCTIONAL STATUS:  She is able to climb stairs.  She does not drive secondary to seizures.  She is independent with self-care.  She does complain of balance problems and poor endurance.  She also admits to depression, anxiety, occasional dizziness.  She also reports weight gain.  PAST MEDICAL HISTORY:  Otherwise unchanged.  SOCIAL HISTORY:  Otherwise unchanged.  FAMILY HISTORY:  Otherwise unchanged.  MEDICATIONS:  Medications [facility] through [facility] include Norco, hydrocodone/acetaminophen 1 p.o. q.i.d., gabapentin 100 mg 1 p.o. q.i.d. as well.  PHYSICAL EXAMINATION:  The patient was unable to tolerate repeat blood pressure cuff today therefore blood pressure was not obtained, pulse is 89, respirations 18, 98% saturated on room air.  She is a well- developed, well-nourished, obese woman who does not appear in any distress.  She is oriented x3.  Speech is clear.  Affect  is bright.  She is alert, cooperative, and pleasant.  Follows commands without difficulty.  Answers my questions appropriately.  Cranial nerves, coordination are grossly intact.  Her reflexes are brisk in both upper and lower extremities.  She has positive Hoffmann more so on the right than on the left today.  No clonus is noted, however, she transitions easily from sitting to standing.  She has difficulty with tandem gait, but is able to perform Romberg test.  Her strength in the upper and lower extremities is good without obvious focal deficit.  She has decreased sensation in both bilateral upper as well as lower extremities.  Normal pulses are noted in the upper extremities.  No erythema or edema is obvious to me today.  IMPRESSION: 1. Bilateral upper extremity and lower extremity pain, stocking glove     distribution, etiology not clear.  This began approximately in     January 2012.  Electrodiagnostic studies of the bilateral upper     extremities were essentially within normal limits.  She has had a     cervical MRI, which is attached to the chart, showing some mild     degenerative changes. 2. History of fibromyalgia. 3. Occasional left knee pain with some mild osteoarthritis changes     known per patient history. 4. History of seizure disorder. 5. Depression/anxiety. 6. Cervicalgia.  PLAN:  At this point, we would like to obtain some blood work to determine whether there may be a small fiber neuropathy.  We  will obtain vitamin B12, glucose, serum protein immunofixation electrophoresis, as well as thyroid function tests.  For her balance disorder, I would like to get her involved in physical therapy program with an emphasis on lower extremity strength, standing, balance, and endurance.  She is in agreement with this.  This is to prevent falls.  She has had some trouble with this as well.  She did not bring in her pill bottle today, I have told her that she needs to bring  her pill bottle in so we can do pill counts and monitor the use of her narcotic pain medication appropriately.  Her last urine drug screen was consistent, which was on July 03, 2010.  I refilled her hydrocodone.  She does not need a refill on her gabapentin and I will see her back in a month.  I have answered all her questions.  She is comfortable with the treatment plan at this point as well as workup.     Brantley Stage, M.D. Electronically Signed    DMK/MedQ D:  10/14/2010 14:33:40  T:  10/15/2010 02:09:57  Job #:  [id number]

## 2010-10-21 ENCOUNTER — Ambulatory Visit: Payer: 59 | Attending: Physical Medicine and Rehabilitation | Admitting: Physical Therapy

## 2010-10-21 DIAGNOSIS — R269 Unspecified abnormalities of gait and mobility: Secondary | ICD-10-CM | POA: Insufficient documentation

## 2010-10-21 DIAGNOSIS — IMO0001 Reserved for inherently not codable concepts without codable children: Secondary | ICD-10-CM | POA: Insufficient documentation

## 2010-10-22 ENCOUNTER — Telehealth: Payer: Self-pay | Admitting: *Deleted

## 2010-10-22 NOTE — Telephone Encounter (Signed)
Pt is taking 50 mg bid

## 2010-10-22 NOTE — Telephone Encounter (Signed)
Needs to decrease to 25mg  bid x2 weeks (#28) and then decrease to 12.5mg  bid x2 weeks and then 12.5mg  daily x2 weeks (#42)

## 2010-10-22 NOTE — Telephone Encounter (Signed)
She will need to let us know what dose she is taking- she may need to wean off this medicine rather than just stopping it

## 2010-10-22 NOTE — Telephone Encounter (Signed)
Pt states that she was given Savella for her fibromalgia but it is not helping. Pt would like to now stop med. Is this ok. Please advise

## 2010-10-23 MED ORDER — MILNACIPRAN HCL 25 MG PO TABS: 25.0000 mg | ORAL_TABLET | Freq: Two times a day (BID) | ORAL | Status: DC

## 2010-10-23 MED ORDER — MILNACIPRAN HCL 12.5 MG PO TABS: 12.5000 mg | ORAL_TABLET | Freq: Two times a day (BID) | ORAL | Status: DC

## 2010-10-23 NOTE — Telephone Encounter (Signed)
Discuss with patient  

## 2010-10-25 ENCOUNTER — Ambulatory Visit (INDEPENDENT_AMBULATORY_CARE_PROVIDER_SITE_OTHER): Payer: 59 | Admitting: Family Medicine

## 2010-10-25 ENCOUNTER — Encounter: Payer: Self-pay | Admitting: Family Medicine

## 2010-10-25 DIAGNOSIS — G43909 Migraine, unspecified, not intractable, without status migrainosus: Secondary | ICD-10-CM

## 2010-10-25 MED ORDER — PROMETHAZINE HCL 25 MG/ML IJ SOLN
25.0000 mg | Freq: Once | INTRAMUSCULAR | Status: AC
  Administered 2010-10-25: 25 mg via INTRAMUSCULAR

## 2010-10-25 MED ORDER — PROMETHAZINE HCL 25 MG/ML IJ SOLN: 25.0000 mg | Freq: Once | INTRAMUSCULAR | Status: DC

## 2010-10-25 MED ORDER — KETOROLAC TROMETHAMINE 60 MG/2ML IM SOLN: 60.0000 mg | Freq: Once | INTRAMUSCULAR | Status: AC

## 2010-10-25 MED ORDER — KETOROLAC TROMETHAMINE 60 MG/2ML IM SOLN
60.0000 mg | Freq: Once | INTRAMUSCULAR | Status: AC
  Administered 2010-10-25: 60 mg via INTRAMUSCULAR

## 2010-10-25 MED ORDER — PROMETHAZINE HCL 25 MG/ML IJ SOLN: 25.0000 mg | INTRAMUSCULAR | Status: DC | PRN

## 2010-10-25 NOTE — Progress Notes (Signed)
  Subjective:    Patient ID: [name], female    DOB: September 29, 1955, 55 y.o.   MRN: [id number]  HPI Migraine- hx of similar.  This episode started 3 days ago.  L frontal/temporal.  Mild nausea.  Denies photo or phonophobia.  No focal weakness or numbness, change in speech, visual changes.  No relief w/ Ultram.  Not taking naproxen.  Would like a toradol and phenergan injxn as this typically improves her sxs.   Review of Systems For ROS see HPI     Objective:   Physical Exam  Vitals reviewed. Constitutional: She is oriented to person, place, and time. She appears well-developed and well-nourished. No distress.  HENT:  Head: Normocephalic and atraumatic.  Nose: Nose normal.       TMs normal bilaterally  Eyes: Conjunctivae and EOM are normal. Pupils are equal, round, and reactive to light.  Neck: Normal range of motion. Neck supple.  Cardiovascular: Normal rate, regular rhythm and normal heart sounds.   Pulmonary/Chest: Effort normal and breath sounds normal. No respiratory distress.  Musculoskeletal: She exhibits no edema.  Neurological: She is alert and oriented to person, place, and time. She has normal reflexes. No cranial nerve deficit. Coordination normal.          Assessment & Plan:

## 2010-10-25 NOTE — Patient Instructions (Signed)
The Toradol should last for the rest of the day You can add tylenol as needed Drink plenty of fluids REST! Hang in there!

## 2010-10-27 NOTE — Assessment & Plan Note (Signed)
Hx of similar.  No red flags on hx or PE.  toradol and phenergan injxn given.  Reviewed supportive care and red flags that should prompt return.  Pt expressed understanding and is in agreement w/ plan.

## 2010-10-28 ENCOUNTER — Ambulatory Visit: Payer: 59 | Admitting: Physical Therapy

## 2010-10-28 ENCOUNTER — Other Ambulatory Visit: Payer: Self-pay | Admitting: *Deleted

## 2010-10-30 ENCOUNTER — Ambulatory Visit: Payer: 59 | Admitting: Physical Therapy

## 2010-10-30 MED ORDER — TRAMADOL HCL 50 MG PO TABS: 50.0000 mg | ORAL_TABLET | Freq: Four times a day (QID) | ORAL | Status: DC | PRN

## 2010-10-30 NOTE — Telephone Encounter (Signed)
Ok for #20, no refills 

## 2010-10-30 NOTE — Telephone Encounter (Signed)
rx sent per tabori, tramadol # 20 with 0 refills

## 2010-10-30 NOTE — Telephone Encounter (Signed)
Was not sure my other note went through so this may be a repeat. Pt called in for RX refill of Tramadol, last OV 10-25-10 last refill 09-30-10

## 2010-11-04 ENCOUNTER — Ambulatory Visit: Payer: 59 | Admitting: Physical Therapy

## 2010-11-06 ENCOUNTER — Ambulatory Visit: Payer: 59 | Admitting: Physical Therapy

## 2010-11-11 ENCOUNTER — Ambulatory Visit: Payer: 59 | Attending: Physical Medicine and Rehabilitation | Admitting: Physical Therapy

## 2010-11-11 DIAGNOSIS — R269 Unspecified abnormalities of gait and mobility: Secondary | ICD-10-CM | POA: Insufficient documentation

## 2010-11-11 DIAGNOSIS — IMO0001 Reserved for inherently not codable concepts without codable children: Secondary | ICD-10-CM | POA: Insufficient documentation

## 2010-11-13 ENCOUNTER — Ambulatory Visit: Payer: 59 | Admitting: Physical Therapy

## 2010-11-13 ENCOUNTER — Encounter: Payer: 59 | Attending: Physical Medicine and Rehabilitation | Admitting: Physical Medicine and Rehabilitation

## 2010-11-13 DIAGNOSIS — F329 Major depressive disorder, single episode, unspecified: Secondary | ICD-10-CM | POA: Insufficient documentation

## 2010-11-13 DIAGNOSIS — R279 Unspecified lack of coordination: Secondary | ICD-10-CM | POA: Insufficient documentation

## 2010-11-13 DIAGNOSIS — M545 Low back pain, unspecified: Secondary | ICD-10-CM | POA: Insufficient documentation

## 2010-11-13 DIAGNOSIS — G8929 Other chronic pain: Secondary | ICD-10-CM | POA: Insufficient documentation

## 2010-11-13 DIAGNOSIS — Z85841 Personal history of malignant neoplasm of brain: Secondary | ICD-10-CM | POA: Insufficient documentation

## 2010-11-13 DIAGNOSIS — F3289 Other specified depressive episodes: Secondary | ICD-10-CM | POA: Insufficient documentation

## 2010-11-13 DIAGNOSIS — F411 Generalized anxiety disorder: Secondary | ICD-10-CM | POA: Insufficient documentation

## 2010-11-13 DIAGNOSIS — IMO0001 Reserved for inherently not codable concepts without codable children: Secondary | ICD-10-CM | POA: Insufficient documentation

## 2010-11-13 DIAGNOSIS — M1710 Unilateral primary osteoarthritis, unspecified knee: Secondary | ICD-10-CM

## 2010-11-13 DIAGNOSIS — R569 Unspecified convulsions: Secondary | ICD-10-CM | POA: Insufficient documentation

## 2010-11-13 DIAGNOSIS — M25549 Pain in joints of unspecified hand: Secondary | ICD-10-CM

## 2010-11-13 DIAGNOSIS — M79609 Pain in unspecified limb: Secondary | ICD-10-CM | POA: Insufficient documentation

## 2010-11-13 DIAGNOSIS — M25579 Pain in unspecified ankle and joints of unspecified foot: Secondary | ICD-10-CM

## 2010-11-13 DIAGNOSIS — M542 Cervicalgia: Secondary | ICD-10-CM | POA: Insufficient documentation

## 2010-11-13 NOTE — Assessment & Plan Note (Signed)
HISTORY OF PRESENT ILLNESS:  [name] is a pleasant 55 year old woman who is followed here at our [facility].  She has complex pain complaints.  She has a history of migraine headaches as well as previous multiple brain resections for brain tumor.  She has chronic pain in both upper and lower extremities which began in January 2012.  She describes her pain as a burning, stabbing, tingling, and aching in nature.  She has been on some gabapentin recently.  It seems that it has helped somewhat.  Her pain score has went from a 10 down to a 7.  She has some mild left knee pain which is most likely osteoarthritis.  She has had a rheumatologic workup which was rather unrevealing.  She is in today for a brief recheck and review of some of her recent blood work.  She reports pain interfering moderately with activity.  Prior to gabapentin, she reported significant interference of her pain with her activity level.  Pain is rather constant.  Sleep is fair.  Pain is worse.  She really cannot describe what makes her pain worse.  It does seems to get better somewhat with rest and medication.  She reports good relief with current meds.  FUNCTIONAL STATUS:  She is able to climb stairs.  She does not drive. She is independent with self-care.  REVIEW OF SYSTEMS:  Positive for weakness, numbness, tremors, tingling, spasms, dizziness, depression, anxiety.  Denies suicidal ideation. Reports night sweats.  Recent bronchitis.  Otherwise, no change in past medical history.  On reviewing previous records, Ms. Gradilla had ANA rheumatoid factor, Lyme serology, sed rate, and CK in December 2011.  All were in normal limits except for the sed rate which was slightly elevated.  LABORATORY DATA:  Labs done today reveal a nonspecific increase in the alpha-2 region of her serum protein electrophoresis.  We will discuss further with her primary care.  Glucose was within normal  range.  TSH was within normal range.  Her vitamin B12 was 384 which is within the normal range, 211-911.  May consider checking MMA, as sometimes B12 deficiency can still be present when the B12 is in the low normal range.  PHYSICAL EXAMINATION:  VITAL SIGNS:  Today, her blood pressure is 137/79, pulse 101, respiration 18, 95% saturated on room air. GENERAL:  She is an obese woman who does not appear in any distress. She does seem somewhat tired.  She is oriented x3. NEUROLOGIC:  Speech is clear.  Affect is bright.  She is alert, cooperative, and pleasant.  Follows commands without difficulty. Answers my questions appropriately.  Cranial nerves to coordination are grossly intact.  Her reflexes are brisk in both upper and lower extremities without clonus or positive Hoffmann.  She has relatively good strength in both upper and lower extremities without obvious focal deficits.  She reports hypersensitivity to pinprick in the hands and feet, as compared to more proximally.  She transitions from sitting to standing without difficulty.  She has minimal limitations in cervical and lumbar motion.  Her balance is somewhat compromised, especially when testing Romberg and tandem gait.  IMPRESSION: 1. Bilateral upper and lower extremity pain in stocking glove     distribution. 2. Chronic low back pain. 3. History of fibromyalgia. 4. History of brain tumor status post multiple resections. 5. Seizure history. 6. Depression/anxiety. 7. History of cervicalgia. 8. Balance disorder.  PLAN:  Encourage her to continue physical therapy program working on balance.  She seems to be doing well on the gabapentin.  She was taking it inappropriately.  She missread the instructions and thought she was suppose to take all 4 pills at once.  I have redirected her to take it 4 times throughout the day.  She understands and states she will take it as directed now.  She brought in her pain medications for  pill counts.  Her pill counts are appropriate.  In fact, she really did not use much in the way of hydrocodone last month.  She was given 120 on October 14, 2010.  She brings back 97 pills today on November 13, 2010.  She is currently comfortable with our plan.  I will see her back in a month. Abnormal labs were discussed with her PCP as well as faxed to her office.     Brantley Stage, M.D. Electronically Signed    DMK/MedQ D:  11/13/2010 13:51:53  T:  11/13/2010 15:14:34  Job #:  [id number]

## 2010-11-15 ENCOUNTER — Telehealth: Payer: Self-pay | Admitting: *Deleted

## 2010-11-15 DIAGNOSIS — R799 Abnormal finding of blood chemistry, unspecified: Secondary | ICD-10-CM

## 2010-11-15 NOTE — Telephone Encounter (Signed)
Spoke to pt about results and advised that hematology would contact her soon for an appt. Referral sent for hemotology

## 2010-11-18 ENCOUNTER — Ambulatory Visit: Payer: 59 | Admitting: Physical Therapy

## 2010-11-19 ENCOUNTER — Other Ambulatory Visit: Payer: Self-pay | Admitting: *Deleted

## 2010-11-19 NOTE — Telephone Encounter (Signed)
Ok for refill, #60, no refills

## 2010-11-19 NOTE — Telephone Encounter (Signed)
Last filled 10-29-09 #20,  last OV 10-25-10

## 2010-11-20 ENCOUNTER — Telehealth: Payer: Self-pay | Admitting: *Deleted

## 2010-11-20 ENCOUNTER — Ambulatory Visit: Payer: 59 | Admitting: Physical Therapy

## 2010-11-20 MED ORDER — TRAMADOL HCL 50 MG PO TABS: 50.0000 mg | ORAL_TABLET | Freq: Four times a day (QID) | ORAL | Status: DC | PRN

## 2010-11-20 NOTE — Telephone Encounter (Signed)
hemotology called per did not have labs in chart noted, advised that her appt may be delayed per information not in chart. Noted the labs were from outside source and I personally placed in to be scanned, however not in system as of yet

## 2010-11-20 NOTE — Telephone Encounter (Signed)
Talked to [place] at referring looking for current labs. They sent them to scan center and they are not in epic . Dr. Myna Hidalgo aware

## 2010-11-20 NOTE — Telephone Encounter (Signed)
Original labs were done by Dr Maretta Los (pain management)- she would have results to fax to heme if needed

## 2010-11-20 NOTE — Telephone Encounter (Signed)
Rx sent 

## 2010-11-21 ENCOUNTER — Telehealth: Payer: Self-pay | Admitting: *Deleted

## 2010-11-21 NOTE — Telephone Encounter (Signed)
[name] from referring MD returned my call. She said the labs for the Pt were sent to scan center last week and they don't have them. She is going to call me with the other referring MD number to get the records

## 2010-11-21 NOTE — Telephone Encounter (Signed)
Joylene Draft with Hematology to advise the Original labs were done by Dr Maretta Los (pain management)- she would have results to fax to heme if needed. Left message for him to call office back to have him contact Dr. Maretta Los per the lab results that we had in office have been sent to be scanned and unfortunatly are not in the system as scanned in as of today.

## 2010-11-21 NOTE — Telephone Encounter (Signed)
Spoke to [name] to advise that we are currently trying to get a copy of the labs from Dr Maretta Los so the hematologist can schedule an appt for her to come in. I contacted MD [facility]sch[facility] nurse and left a vm to call office so we can have a copy of labs sent to hematologist. Advised to [name] that we will call her as soon as we locate the labs and apologized for any inconvience in the delay. [name] understood and will be waiting for our call back

## 2010-11-22 ENCOUNTER — Telehealth: Payer: Self-pay | Admitting: *Deleted

## 2010-11-22 ENCOUNTER — Encounter: Payer: Self-pay | Admitting: *Deleted

## 2010-11-22 NOTE — Telephone Encounter (Signed)
Kim from referring called they are still trying to get current lab results for patient

## 2010-11-22 NOTE — Telephone Encounter (Signed)
Called and spoke with medical records for Dr. Micheline Chapman per hilary  to advise if they have released the labs for pt in order to have her hematology visit. Skeet Simmer is currently sending the labs to our office via fax. Called Rick at cancer center and advised that I will be faxing over labs to their office at 5pm. Raiford Noble understood.

## 2010-11-25 ENCOUNTER — Telehealth: Payer: Self-pay | Admitting: *Deleted

## 2010-11-25 NOTE — Telephone Encounter (Signed)
Per Dr. Myna Hidalgo called referring talked to [facility]. She is aware Dr. Myna Hidalgo doesn't see an issue with Pt's blood work and that we don't need to see her.

## 2010-11-27 ENCOUNTER — Telehealth: Payer: Self-pay | Admitting: *Deleted

## 2010-11-27 ENCOUNTER — Other Ambulatory Visit: Payer: Self-pay

## 2010-11-27 NOTE — Telephone Encounter (Signed)
Was advised by Raiford Noble from Hematology that Dr Myna Hidalgo reviewed the labs and paperwork for the pt sent by Md Tabori, and advised that Dr. Myna Hidalgo does not see any need for the pt to be seen by hematology, the labs/information we sent per spike in Serum Protein Electrophoresis noted on labs drawn from Dr. Pamelia Hoit do not provide a need for hematology. Called Dr. Merrie Roof to advise the decision that Dr Myna Hidalgo made per Electropherisis note, left message on nurse line for Dr Merrie Roof to advise results of call for no need for hematology review and to call our office with any concerns. Called pt to advise the decision left message for pt to call office.

## 2010-12-02 ENCOUNTER — Ambulatory Visit: Payer: 59 | Admitting: Physical Therapy

## 2010-12-04 ENCOUNTER — Other Ambulatory Visit (HOSPITAL_COMMUNITY)
Admission: RE | Admit: 2010-12-04 | Discharge: 2010-12-04 | Disposition: A | Discharge status: Home / Self Care | Payer: 59 | Source: Ambulatory Visit | Attending: Family Medicine | Admitting: Family Medicine

## 2010-12-04 ENCOUNTER — Ambulatory Visit (INDEPENDENT_AMBULATORY_CARE_PROVIDER_SITE_OTHER): Payer: 59 | Admitting: Family Medicine

## 2010-12-04 ENCOUNTER — Ambulatory Visit: Payer: 59 | Admitting: Physical Therapy

## 2010-12-04 ENCOUNTER — Encounter: Payer: Self-pay | Admitting: Family Medicine

## 2010-12-04 DIAGNOSIS — Z01419 Encounter for gynecological examination (general) (routine) without abnormal findings: Secondary | ICD-10-CM

## 2010-12-04 DIAGNOSIS — E785 Hyperlipidemia, unspecified: Secondary | ICD-10-CM

## 2010-12-04 DIAGNOSIS — Z23 Encounter for immunization: Secondary | ICD-10-CM

## 2010-12-04 DIAGNOSIS — Z0000 Encounter for general adult medical examination without abnormal findings: Secondary | ICD-10-CM | POA: Insufficient documentation

## 2010-12-04 DIAGNOSIS — Z124 Encounter for screening for malignant neoplasm of cervix: Secondary | ICD-10-CM | POA: Insufficient documentation

## 2010-12-04 DIAGNOSIS — N760 Acute vaginitis: Secondary | ICD-10-CM | POA: Insufficient documentation

## 2010-12-04 LAB — LIPID PANEL
HDL: 55.3 mg/dL (ref >39.00)
LDL Cholesterol: 116 mg/dL — ABNORMAL HIGH (ref 0–99)
Total CHOL/HDL Ratio: 4
Triglycerides: 143 mg/dL (ref 0.0–149.0)
VLDL: 28.6 mg/dL (ref 0.0–40.0)

## 2010-12-04 LAB — CBC WITH DIFFERENTIAL/PLATELET
Basophils Absolute: 0 10*3/uL (ref 0.0–0.1)
Eosinophils Absolute: 0.1 10*3/uL (ref 0.0–0.7)
Eosinophils Relative: 1.1 % (ref 0.0–5.0)
Lymphs Abs: 1.9 10*3/uL (ref 0.7–4.0)
MCV: 88.9 fl (ref 78.0–100.0)
Monocytes Absolute: 0.5 10*3/uL (ref 0.1–1.0)
Neutrophils Relative %: 65 % (ref 43.0–77.0)
Platelets: 261 10*3/uL (ref 150.0–400.0)
RDW: 15.8 % — ABNORMAL HIGH (ref 11.5–14.6)
WBC: 7.2 10*3/uL (ref 4.5–10.5)

## 2010-12-04 LAB — BASIC METABOLIC PANEL
Calcium: 9.3 mg/dL (ref 8.4–10.5)
GFR: 76.36 mL/min (ref >60.00)
Glucose, Bld: 81 mg/dL (ref 70–99)
Potassium: 3.6 mEq/L (ref 3.5–5.1)
Sodium: 143 mEq/L (ref 135–145)

## 2010-12-04 LAB — HEPATIC FUNCTION PANEL
AST: 29 U/L (ref 0–37)
Alkaline Phosphatase: 70 U/L (ref 39–117)
Bilirubin, Direct: 0 mg/dL (ref 0.0–0.3)
Total Bilirubin: 0.2 mg/dL — ABNORMAL LOW (ref 0.3–1.2)

## 2010-12-04 NOTE — Assessment & Plan Note (Signed)
Pap collected. 

## 2010-12-04 NOTE — Progress Notes (Signed)
  Subjective:    Patient ID: [name], female    DOB: April 09, 1955, 55 y.o.   MRN: [id number]  HPI CPE- UTD on mammo and colonoscopy.  Due for pap today.   Review of Systems Patient reports no vision/ hearing changes, adenopathy,fever, weight change,  persistant/recurrent hoarseness , swallowing issues, chest pain, palpitations, edema, persistant/recurrent cough, hemoptysis, dyspnea (rest/exertional/paroxysmal nocturnal), gastrointestinal bleeding (melena, rectal bleeding), abdominal pain, significant heartburn, bowel changes, GU symptoms (dysuria, hematuria, incontinence), Gyn symptoms (abnormal  bleeding, pain),  syncope, focal weakness, memory loss, numbness & tingling, skin/hair/nail changes, abnormal bruising or bleeding, anxiety, or depression.     Objective:   Physical Exam  General Appearance:    Alert, cooperative, no distress, appears stated age  Head:    Normocephalic, without obvious abnormality, atraumatic  Eyes:    PERRL, conjunctiva/corneas clear, EOM's intact, fundi    benign, both eyes  Ears:    Normal TM's and external ear canals, both ears  Nose:   Nares normal, septum midline, mucosa normal, no drainage    or sinus tenderness  Throat:   Lips, mucosa, and tongue normal; teeth and gums normal  Neck:   Supple, symmetrical, trachea midline, no adenopathy;    Thyroid: no enlargement/tenderness/nodules  Back:     Symmetric, no curvature, ROM normal, no CVA tenderness  Lungs:     Clear to auscultation bilaterally, respirations unlabored  Chest Wall:    No tenderness or deformity   Heart:    Regular rate and rhythm, S1 and S2 normal, no murmur, rub   or gallop  Breast Exam:    No tenderness, masses, or nipple abnormality  Abdomen:     Soft, non-tender, bowel sounds active all four quadrants,    no masses, no organomegaly  Genitalia:    External genitalia normal, cervix normal in appearance, no CMT, uterus in normal size and position, adnexa w/out mass or tenderness, mucosa  pink and moist, no lesions but copious vaginal dc  Rectal:    Normal external appearance  Extremities:   Extremities normal, atraumatic, no cyanosis or edema  Pulses:   2+ and symmetric all extremities  Skin:   Skin color, texture, turgor normal, no rashes or lesions  Lymph nodes:   Cervical, supraclavicular, and axillary nodes normal  Neurologic:   CNII-XII intact, normal strength, sensation and reflexes    throughout          Assessment & Plan:

## 2010-12-04 NOTE — Assessment & Plan Note (Signed)
Pt's PE WNL.  Encouraged regular exercise and healthy diet choices.  UTD on health maintenance.  Check labs.  Anticipatory guidance provided.

## 2010-12-04 NOTE — Patient Instructions (Signed)
Follow up in 6 months to recheck cholesterol We'll call you when we have shingles shots available We'll notify you of your lab results Keep up the good work- you look good! Call with any questions or concerns Happy Holidays!

## 2010-12-04 NOTE — Assessment & Plan Note (Signed)
Check labs.  Adjust meds prn  

## 2010-12-05 ENCOUNTER — Encounter: Payer: Self-pay | Admitting: *Deleted

## 2010-12-05 ENCOUNTER — Telehealth: Payer: Self-pay | Admitting: *Deleted

## 2010-12-05 MED ORDER — FLUCONAZOLE 150 MG PO TABS: 150.0000 mg | ORAL_TABLET | Freq: Once | ORAL | Status: AC

## 2010-12-05 NOTE — Progress Notes (Signed)
Addended by: Sheliah Hatch on: 12/05/2010 02:58 PM   Modules accepted: Orders

## 2010-12-05 NOTE — Telephone Encounter (Signed)
Discuss with patient  

## 2010-12-05 NOTE — Telephone Encounter (Signed)
Yes- script sent.  Please let her know.

## 2010-12-05 NOTE — Telephone Encounter (Signed)
Pt states that during her Pap dr Beverely Low indicated that she saw a lot of yeast and was going to Rx diflucan. Pt would like to have Rx now.Please advise

## 2010-12-06 LAB — VITAMIN D 1,25 DIHYDROXY: Vitamin D 1, 25 (OH)2 Total: 76 pg/mL — ABNORMAL HIGH (ref 18–72)

## 2010-12-09 ENCOUNTER — Ambulatory Visit: Payer: 59 | Admitting: Physical Therapy

## 2010-12-11 ENCOUNTER — Ambulatory Visit: Payer: 59 | Admitting: Physical Therapy

## 2010-12-11 ENCOUNTER — Encounter: Payer: 59 | Attending: Physical Medicine and Rehabilitation | Admitting: Physical Medicine and Rehabilitation

## 2010-12-11 DIAGNOSIS — F411 Generalized anxiety disorder: Secondary | ICD-10-CM | POA: Insufficient documentation

## 2010-12-11 DIAGNOSIS — M545 Low back pain, unspecified: Secondary | ICD-10-CM | POA: Insufficient documentation

## 2010-12-11 DIAGNOSIS — M79609 Pain in unspecified limb: Secondary | ICD-10-CM | POA: Insufficient documentation

## 2010-12-11 DIAGNOSIS — IMO0001 Reserved for inherently not codable concepts without codable children: Secondary | ICD-10-CM | POA: Insufficient documentation

## 2010-12-11 DIAGNOSIS — F329 Major depressive disorder, single episode, unspecified: Secondary | ICD-10-CM | POA: Insufficient documentation

## 2010-12-11 DIAGNOSIS — F3289 Other specified depressive episodes: Secondary | ICD-10-CM | POA: Insufficient documentation

## 2010-12-11 DIAGNOSIS — M25549 Pain in joints of unspecified hand: Secondary | ICD-10-CM

## 2010-12-11 DIAGNOSIS — G894 Chronic pain syndrome: Secondary | ICD-10-CM

## 2010-12-11 DIAGNOSIS — M542 Cervicalgia: Secondary | ICD-10-CM | POA: Insufficient documentation

## 2010-12-11 DIAGNOSIS — R279 Unspecified lack of coordination: Secondary | ICD-10-CM | POA: Insufficient documentation

## 2010-12-11 DIAGNOSIS — G8929 Other chronic pain: Secondary | ICD-10-CM | POA: Insufficient documentation

## 2010-12-11 DIAGNOSIS — G40909 Epilepsy, unspecified, not intractable, without status epilepticus: Secondary | ICD-10-CM | POA: Insufficient documentation

## 2010-12-11 NOTE — Assessment & Plan Note (Signed)
[name] is a pleasant 55 year old who was seen last by me on November 13, 2010.  She has complex pain complaints.  She has a history of migraine headaches as well as previous multiple brain resections for tumor.  She has chronic pain in both upper and lower extremities which began rather suddenly in January 2012.  She describes the pain as burning, stabbing, tingling, aching in nature.  She has been on gabapentin which seems to help her somewhat.  Her pain score went down from 10 to today is about 5.  She has some mild intermittent left knee pain which is most likely osteoarthritis.  She has had a rheumatologic workup which was unrevealing.  She had serum protein electrophoresis which revealed an A2NS, nonspecific increase in the alpha-2 region. This was relayed to her primary care and labs were sent to Hematology and it was felt to be of insignificance.  She did have a B12 level which is in the low-normal range, and we have discussed considering following up with methylmalonic acid testing.  Activity is moderately interfered by various pain complaints.  Sleep is fair.  Pain is worse with walking.  Pain is localized.  She has intermittent headaches, bilateral shoulder, hands, foot and occasional left knee pain.  She also has a mild balance disorder and has been attending physical therapy and reports overall improvement with her balance.  There has really been no changes other than improvement in balance with mobility or function.  She is independent with self-care.  She is working on going up and down stairs.  She reports improvement and is feeling safer.  REVIEW OF SYSTEMS:  Otherwise negative.  Past medical, social, family history unchanged.  Exam; her blood pressure is 148/88, pulse 87, respirations 18, 100% saturated on room air.  She is 230 pounds and 5 foot 2 inches tall.  She is an obese woman who does not appear in any distress.  She is oriented x3.  Speech is clear.   Affect is bright.  She is alert, cooperative, and pleasant.  Follows commands without difficulty.  Answers my questions appropriately.  Cranial nerves, coordination are grossly intact. Reflexes are 2+ in the upper extremities, 1+ in the lower extremities. There is no abnormal tone, clonus or tremors noted.  Hoffmann sign is negative today as well.  She has intact sensation in upper and lower extremities.  Motor strength 5/5 in upper and lower extremities.  She transitions from sitting to standing without difficulty.  She is able to go from sit to stand without using upper extremities to push off.  Her balance is slightly compromise with tandem gait and she is able to walk on heels and toes.  She has some limitations in lumbar motion, describes some mild low back pain with extension.  IMPRESSION: 1. Bilateral upper and lower extremity pain and stocking glove     distribution.  Etiology not clear, may be small fiber neuropathy. 2. Chronic low back pain. 3. History of fibromyalgia. 4. History of brain tumor status post multiple resections followed at     an outside clinic. 5. History of seizure disorder. 6. Depression/anxiety. 7. Cervicalgia. 8. Balance disorder.  PLAN:  We will continue gabapentin to help manage hand and foot burning. She reports overall improvement with the use of gabapentin.  She is using 100 mg 4 times a day.  She is also using hydrocodone 4 times a day for her other pain complaints.  She reports overall fair relief with these medications.  She has been taking her medications as prescribed without evidence of aberrant behavior.  Her last urine drug screen was June 21, 2010, and was consistent.  She is going to be finishing up with physical therapy program and continuing a home program.  We will obtain blood work today for [employer].  I asked her to maintain contact with her other physicians for her multiple other medical problems.  She is currently comfortable with our  plan at this time.  We also provided her an opioid medication informed consent today.  She had no questions regarding it. She signed as well.  I will see her back in a month.     Brantley Stage, M.D. Electronically Signed    DMK/MedQ D:  12/11/2010 12:38:50  T:  12/11/2010 23:17:13  Job #:  [id number]

## 2010-12-13 ENCOUNTER — Other Ambulatory Visit: Payer: Self-pay | Admitting: *Deleted

## 2010-12-13 MED ORDER — METRONIDAZOLE 500 MG PO TABS: 500.0000 mg | ORAL_TABLET | Freq: Two times a day (BID) | ORAL | Status: AC

## 2010-12-13 MED ORDER — TRAMADOL HCL 50 MG PO TABS: 50.0000 mg | ORAL_TABLET | Freq: Four times a day (QID) | ORAL | Status: DC | PRN

## 2010-12-13 NOTE — Progress Notes (Signed)
Addended by: Derry Lory A on: 12/13/2010 03:11 PM   Modules accepted: Orders

## 2010-12-13 NOTE — Telephone Encounter (Signed)
Received fax from pharmacy for a request for pt refill on tramadol hcl 50mg . recevied verbal order based on pt chart information to send pt #30 with no refill sending in refill in absence of MD Tabori. Called pt to advise sent to pharmacy

## 2010-12-16 ENCOUNTER — Ambulatory Visit: Payer: 59 | Admitting: Physical Therapy

## 2010-12-19 ENCOUNTER — Telehealth: Payer: Self-pay | Admitting: *Deleted

## 2010-12-19 NOTE — Telephone Encounter (Signed)
Unable to view any prior use of Lyrica, please note dosage/instructions

## 2010-12-19 NOTE — Telephone Encounter (Signed)
That would be fine 

## 2010-12-19 NOTE — Telephone Encounter (Signed)
Pt left VM wanting to know if you are willing to Rx lyrica for her.Please advise

## 2010-12-20 MED ORDER — PREGABALIN 75 MG PO CAPS: 75.0000 mg | ORAL_CAPSULE | Freq: Two times a day (BID) | ORAL | Status: DC

## 2010-12-20 NOTE — Telephone Encounter (Signed)
Can start Lyrica 75mg  bid.  This would be in place of the gabapentin- she needs to stop that.  #60, 3 refills.

## 2010-12-20 NOTE — Telephone Encounter (Signed)
Called to advise pt to STOP taking the Gabapentin in order to Start taking the Lyrica 75mg  BID pt undertsood rx sent to pharmacy by fax For #60 with 3 refills

## 2010-12-24 ENCOUNTER — Ambulatory Visit (INDEPENDENT_AMBULATORY_CARE_PROVIDER_SITE_OTHER): Payer: 59 | Admitting: *Deleted

## 2010-12-24 DIAGNOSIS — Z0000 Encounter for general adult medical examination without abnormal findings: Secondary | ICD-10-CM

## 2010-12-24 DIAGNOSIS — Z2911 Encounter for prophylactic immunotherapy for respiratory syncytial virus (RSV): Secondary | ICD-10-CM

## 2011-01-01 ENCOUNTER — Telehealth: Payer: Self-pay | Admitting: Family Medicine

## 2011-01-01 MED ORDER — TRAMADOL HCL 50 MG PO TABS: 50.0000 mg | ORAL_TABLET | Freq: Four times a day (QID) | ORAL | Status: DC | PRN

## 2011-01-01 NOTE — Telephone Encounter (Signed)
Verbal order from MD tabori advised to call pt in #60 tablets per out of rx currently, called in to [facility], called pt to advise the medication had been called in, noted chart of "phone in order" for #60 Tramadol 50MG  6hrs PRN, pt aware for pick up

## 2011-01-01 NOTE — Telephone Encounter (Signed)
Patient states that she is out of Tramadol and would like refill sent to [facility]. Patient states that she has contacted pharmacy but pharmacy told her to call us.

## 2011-01-20 ENCOUNTER — Ambulatory Visit: Payer: 59 | Admitting: Physical Medicine and Rehabilitation

## 2011-01-28 ENCOUNTER — Ambulatory Visit: Payer: 59 | Admitting: Family Medicine

## 2011-01-29 ENCOUNTER — Other Ambulatory Visit: Payer: Self-pay | Admitting: *Deleted

## 2011-01-29 ENCOUNTER — Ambulatory Visit: Payer: 59 | Admitting: Family Medicine

## 2011-01-29 MED ORDER — TRAMADOL HCL 50 MG PO TABS: 50.0000 mg | ORAL_TABLET | Freq: Four times a day (QID) | ORAL | Status: DC | PRN

## 2011-01-29 NOTE — Telephone Encounter (Signed)
rx sent to pharmacy by e-script  

## 2011-01-29 NOTE — Telephone Encounter (Signed)
Last OV 12-04-10 had an OV today but cancelled per too sick to come in, noted last refill 01-01-11 #60 no refills

## 2011-01-29 NOTE — Telephone Encounter (Signed)
Ok for #60, 3 refills 

## 2011-01-31 ENCOUNTER — Other Ambulatory Visit: Payer: Self-pay | Admitting: *Deleted

## 2011-01-31 NOTE — Telephone Encounter (Signed)
vm left from pharmacy [facility] to advise pt Tramdol needed to be refilled, noted on 01-29-11 rx sent with #60 with 3 refills, however noted the rx was set up as phone in, called pharmacy tech Micheal and gave verbal order for #60 with 3 refills, called pt to apologize for delay, pt understood.

## 2011-02-04 ENCOUNTER — Encounter: Payer: Self-pay | Admitting: Family Medicine

## 2011-02-04 ENCOUNTER — Ambulatory Visit (INDEPENDENT_AMBULATORY_CARE_PROVIDER_SITE_OTHER): Payer: 59 | Admitting: Family Medicine

## 2011-02-04 ENCOUNTER — Telehealth: Payer: Self-pay | Admitting: Family Medicine

## 2011-02-04 MED ORDER — PROMETHAZINE HCL 25 MG PO TABS: 25.0000 mg | ORAL_TABLET | Freq: Three times a day (TID) | ORAL | Status: DC | PRN

## 2011-02-04 NOTE — Telephone Encounter (Signed)
Refill-estrace 0.1mg /gm vaginal crm gm 42.5. Insert 2 grams vaginally as directed 3 times per week. Last fill 12.13.12

## 2011-02-04 NOTE — Patient Instructions (Signed)
Continue phenergan as needed If the headache doesn't improve- please call Drink plenty of fluids Hang in there!!!

## 2011-02-04 NOTE — Progress Notes (Signed)
  Subjective:    Patient ID: [name], female    DOB: 02-Dec-1955, 56 y.o.   MRN: [id number]  HPI Migraine- sxs started 2-3 days ago but worsened overnight.  + nausea.  No vomiting.  Taking Tramadol, Norco w/out relief.  No visual changes.  + photophobia.  No phonophobia.  Similar to previous migraines.  Pain is L frontal.  No focal numbness, weakness, dizziness.   Review of Systems For ROS see HPI     Objective:   Physical Exam  Vitals reviewed. Constitutional: She is oriented to person, place, and time. She appears well-developed and well-nourished. No distress.  HENT:  Head: Normocephalic and atraumatic.       TMs normal bilaterally No TTP over sinuses  Eyes: Conjunctivae and EOM are normal. Pupils are equal, round, and reactive to light.  Neck: Normal range of motion. Neck supple.  Lymphadenopathy:    She has no cervical adenopathy.  Neurological: She is alert and oriented to person, place, and time. She has normal reflexes. No cranial nerve deficit. Coordination normal.  Psychiatric: She has a normal mood and affect. Her behavior is normal. Thought content normal.          Assessment & Plan:

## 2011-02-04 NOTE — Assessment & Plan Note (Signed)
Recurrent problem.  No relief w/ home meds.  No red flags on hx or PE.  Will give pt usual toradol and phenergan combo.  Reviewed supportive care and red flags that should prompt return.  Pt expressed understanding and is in agreement w/ plan.

## 2011-02-05 MED ORDER — ESTRADIOL 0.1 MG/GM VA CREA: 2.0000 g | TOPICAL_CREAM | VAGINAL | Status: DC

## 2011-02-05 NOTE — Telephone Encounter (Signed)
rx sent to pharmacy by e-script  

## 2011-02-06 DIAGNOSIS — G43909 Migraine, unspecified, not intractable, without status migrainosus: Secondary | ICD-10-CM

## 2011-02-06 MED ORDER — PROMETHAZINE HCL 25 MG/ML IJ SOLN
25.0000 mg | Freq: Once | INTRAMUSCULAR | Status: AC
  Administered 2011-02-06: 25 mg via INTRAMUSCULAR

## 2011-02-06 MED ORDER — KETOROLAC TROMETHAMINE 60 MG/2ML IJ SOLN
60.0000 mg | Freq: Once | INTRAMUSCULAR | Status: AC
  Administered 2011-02-06: 60 mg via INTRAVENOUS

## 2011-02-06 NOTE — Progress Notes (Signed)
Addended by: Derry Lory A on: 02/06/2011 10:14 AM   Modules accepted: Orders

## 2011-02-07 ENCOUNTER — Telehealth: Payer: Self-pay | Admitting: *Deleted

## 2011-02-07 DIAGNOSIS — R52 Pain, unspecified: Secondary | ICD-10-CM

## 2011-02-07 MED ORDER — HYDROCODONE-ACETAMINOPHEN 5-325 MG PO TABS: 1.0000 | ORAL_TABLET | Freq: Four times a day (QID) | ORAL | Status: DC | PRN

## 2011-02-07 NOTE — Telephone Encounter (Signed)
Pt left vm stating that she is seeing MD Kirschmeyer and "They have done all we can do" so pt wanted to ask if MD Beverely Low can send her a refill for Hydrocodone 5-325 for #120, was given verbal instructions from MD Beverely Low advising that pt is under contract with MD Kirschmeyer and that is the only MD that can prescribe her the hydrocodone at this time. Left message for pt to call office back to discuss with pt

## 2011-02-07 NOTE — Telephone Encounter (Signed)
Patient returned phone call. Best # [phone]

## 2011-02-07 NOTE — Telephone Encounter (Signed)
Called pt to advise that she needs to get her refills from MD Kirchemeyer per she has contract with the pain clinic MD and that is who is supposed to give her medications per MD Beverely Low does not prescribe long term pain medication, pt advised that she was not satisifed with MD Kirchmeyer per when she went to last OV the MD asked "So what are we are doing for you today?" so she re-scheduled her appt and just did not go back, pt advised she is out of hydrocodone 5-325, spoke to MD Tabori,delegated verbal order to pt for hydrocodone 5-325 #60 with 0 refills can be sent with the condition that she will set up an appt with another pain management MD, pt advised that she will be willing to try another pain MD, sent referral for pain clinic with instruction to not have MD Kirschmeyer, sent rx for hydrocodone 5-325 for #60 no refills to [facility]

## 2011-02-14 ENCOUNTER — Telehealth: Payer: Self-pay | Admitting: Family Medicine

## 2011-02-14 NOTE — Telephone Encounter (Signed)
Thank you :)

## 2011-02-14 NOTE — Telephone Encounter (Signed)
In reference to the Pain Clinic referral, per call from Nicole Cella of [facility], patient was contacted to schedule an appointment, and patient stated she is not hurting anymore & declined to schedule an appointment.  They will keep her referral on file for 6 months.

## 2011-02-26 ENCOUNTER — Telehealth: Payer: Self-pay | Admitting: Family Medicine

## 2011-02-26 NOTE — Telephone Encounter (Signed)
Patient called the office asking to speak with Dr. Beverely Low or her nurse in regards to her medication. She didn't specify if she needed refills or not. Wouldn't give me any information. Best number to reach her is [phone].

## 2011-02-27 NOTE — Telephone Encounter (Signed)
Called pt to discuss her medication with her, pt requested lunesta per does not sleep and saw the lunesta on the tv, advised she will need an apt, offered an apt today but pt advised that she will just wait til may when she has to come back in for her next scheduled OV, I advised that she may not want to wait that long if she is not sleeping, pt advised that she will call tomorrow for an apt if she needs one, I offered pt to set up an apt for tomorrow, pt denied apt and will call if she needs to, I advised to pt if any sxs worsen she needs to go to the ER due to her current sleep state, pt understood

## 2011-03-14 ENCOUNTER — Encounter: Payer: Self-pay | Admitting: Family Medicine

## 2011-03-14 ENCOUNTER — Ambulatory Visit (INDEPENDENT_AMBULATORY_CARE_PROVIDER_SITE_OTHER): Payer: 59 | Admitting: Family Medicine

## 2011-03-14 DIAGNOSIS — G4700 Insomnia, unspecified: Secondary | ICD-10-CM | POA: Insufficient documentation

## 2011-03-14 DIAGNOSIS — G4730 Sleep apnea, unspecified: Secondary | ICD-10-CM

## 2011-03-14 DIAGNOSIS — J019 Acute sinusitis, unspecified: Secondary | ICD-10-CM

## 2011-03-14 MED ORDER — CLARITHROMYCIN ER 500 MG PO TB24: 1000.0000 mg | ORAL_TABLET | Freq: Every day | ORAL | Status: DC

## 2011-03-14 NOTE — Patient Instructions (Signed)
This is a sinus infection Start the biaxin- 2 pills at the same time- w/ food Drink plenty of fluids REST! Someone will call you with your pulmonary appt Call with any questions or concerns Hang in there!!!

## 2011-03-14 NOTE — Progress Notes (Signed)
  Subjective:    Patient ID: [name], female    DOB: [birth year], 56 y.o.   MRN: [id number]  HPI Sinusitis- 'i drain all the time'.  + facial pressure.  sxs started 2-3 weeks ago.  No ear pain.  + nasal congestion.  No cough.  No known sick contacts.  No hx of seasonal allergies.  ? Sleep apnea- was told after colonoscopy that she 'needs to have sleep apnea test'.  Would like referral.   Review of Systems For ROS see HPI     Objective:   Physical Exam  Vitals reviewed. Constitutional: She appears well-developed and well-nourished. No distress.  HENT:  Head: Normocephalic and atraumatic.  Right Ear: Tympanic membrane normal.  Left Ear: Tympanic membrane normal.  Nose: Mucosal edema and rhinorrhea present. Right sinus exhibits maxillary sinus tenderness and frontal sinus tenderness. Left sinus exhibits maxillary sinus tenderness and frontal sinus tenderness.  Mouth/Throat: Uvula is midline and mucous membranes are normal. Posterior oropharyngeal erythema present. No oropharyngeal exudate.  Eyes: Conjunctivae and EOM are normal. Pupils are equal, round, and reactive to light.  Neck: Normal range of motion. Neck supple.  Cardiovascular: Normal rate, regular rhythm and normal heart sounds.   Pulmonary/Chest: Effort normal and breath sounds normal. No respiratory distress. She has no wheezes.  Lymphadenopathy:    She has no cervical adenopathy.          Assessment & Plan:

## 2011-03-14 NOTE — Assessment & Plan Note (Signed)
New.  Pt's sxs and PE consistent w/ infxn.  Start abx.  Reviewed supportive care and red flags that should prompt return.  Pt expressed understanding and is in agreement w/ plan.  

## 2011-03-14 NOTE — Assessment & Plan Note (Signed)
Pt not sure why she was told to have evaluation done.  Reports she had an evaluation 'years ago'.  Will refer.

## 2011-03-18 ENCOUNTER — Encounter: Payer: Self-pay | Admitting: Pulmonary Disease

## 2011-03-18 ENCOUNTER — Ambulatory Visit (INDEPENDENT_AMBULATORY_CARE_PROVIDER_SITE_OTHER): Payer: 59 | Admitting: Pulmonary Disease

## 2011-03-18 VITALS — BP 120/84 | HR 87 | Temp 98.6°F | Ht 63.0 in | Wt 234.8 lb

## 2011-03-18 DIAGNOSIS — G4700 Insomnia, unspecified: Secondary | ICD-10-CM

## 2011-03-18 NOTE — Patient Instructions (Signed)
Will set up for a sleep study, and will arrange followup with me to discuss once results are available.

## 2011-03-18 NOTE — Assessment & Plan Note (Signed)
The patient has very frequent awakenings at night, and a lot of her history is suggestive of sleep disordered breathing.  However, she also has a history that may suggest a movement disorder such as the restless leg syndrome.  Finally, she also has a history of a seizure disorder, and I cannot rule out the possibility of occult nocturnal seizures.  I think that she would benefit from a sleep study for evaluation, and the patient is agreeable.  If this is unremarkable, I suspect this is all related to insomnia, and she would benefit from cognitive behavioral therapy with a behavioral specialist.

## 2011-03-18 NOTE — Progress Notes (Signed)
  Subjective:    Patient ID: [name], female    DOB: 09/13/1955, 56 y.o.   MRN: [id number]  HPI The patient is a 56 year old female who I've been asked to see for significant sleep disruption.  The patient has some issues with sleep onset, but is noting frequent awakenings during the night of unknown origin.  She does have a history of snoring, as well as a mild abnormal breathing pattern according to the husband.  She also had a colonoscopy, and it was mentioned to her that she may have sleep apnea.  The patient has not rested in the mornings upon arising, but surprisingly only has mild sleep pressure during the day with periods of inactivity.  She has a history of a seizure disorder, and the husband states that her sleepiness during the day is much improved since being off a lot of these medications.  The patient's Epworth score today is only 4.  The patient denies leg kicks during the night, but she does move her legs a lot in the evenings because they "feel different".  She states that her weight is neutral over the last 2 years.   Review of Systems  Constitutional: Negative for fever and unexpected weight change.  HENT: Positive for rhinorrhea, sneezing, dental problem, postnasal drip and sinus pressure. Negative for ear pain, nosebleeds, congestion, sore throat and trouble swallowing.   Eyes: Negative for redness and itching.  Respiratory: Negative for cough, chest tightness, shortness of breath and wheezing.   Cardiovascular: Negative for palpitations and leg swelling.  Gastrointestinal: Negative for nausea and vomiting.  Genitourinary: Negative for dysuria.  Musculoskeletal: Negative for joint swelling.  Skin: Negative for rash.  Neurological: Positive for headaches.  Hematological: Does not bruise/bleed easily.  Psychiatric/Behavioral: Positive for dysphoric mood. The patient is nervous/anxious.        Objective:   Physical Exam Constitutional:  Obese female, no acute  distress  HENT:  Nares patent without discharge  Oropharynx without exudate, palate and uvula are elongated.  Eyes:  Perrla, eomi, no scleral icterus  Neck:  No JVD, no TMG  Cardiovascular:  Normal rate, regular rhythm, no rubs or gallops.  No murmurs        Intact distal pulses  Pulmonary :  Normal breath sounds, no stridor or respiratory distress   No rales, rhonchi, or wheezing  Abdominal:  Soft, nondistended, bowel sounds present.  No tenderness noted.   Musculoskeletal:  No lower extremity edema noted.  Lymph Nodes:  No cervical lymphadenopathy noted  Skin:  No cyanosis noted  Neurologic:  Alert, appropriate, moves all 4 extremities without obvious deficit.         Assessment & Plan:

## 2011-03-31 ENCOUNTER — Other Ambulatory Visit: Payer: Self-pay | Admitting: Family Medicine

## 2011-03-31 DIAGNOSIS — Z1231 Encounter for screening mammogram for malignant neoplasm of breast: Secondary | ICD-10-CM

## 2011-04-14 ENCOUNTER — Ambulatory Visit (HOSPITAL_BASED_OUTPATIENT_CLINIC_OR_DEPARTMENT_OTHER): Payer: 59 | Attending: Pulmonary Disease | Admitting: Radiology

## 2011-04-14 VITALS — Ht 62.0 in | Wt 225.0 lb

## 2011-04-14 DIAGNOSIS — R0989 Other specified symptoms and signs involving the circulatory and respiratory systems: Secondary | ICD-10-CM | POA: Insufficient documentation

## 2011-04-14 DIAGNOSIS — G4733 Obstructive sleep apnea (adult) (pediatric): Secondary | ICD-10-CM

## 2011-04-14 DIAGNOSIS — R259 Unspecified abnormal involuntary movements: Secondary | ICD-10-CM | POA: Insufficient documentation

## 2011-04-14 DIAGNOSIS — R0609 Other forms of dyspnea: Secondary | ICD-10-CM | POA: Insufficient documentation

## 2011-04-18 ENCOUNTER — Telehealth: Payer: Self-pay | Admitting: Family Medicine

## 2011-04-18 NOTE — Telephone Encounter (Signed)
Caller: Jame/Other; Phone Number: [phone]; Message from caller: Pt saw neurologist /Dr.  Erskine Squibb boggs yesterday at [facility].  She advised the pt to call the office and see if she could increase her Lyrica dose from 50mg  - 100mg  - pts pain in increasing (legs/across her back and her hands/arms).  Office please call pt back at [phone]

## 2011-04-18 NOTE — Telephone Encounter (Signed)
Spoke to pt to advise results/instructions. Pt advised that she is no longer on the neurontin per MD Tabori advised her to no longer take per started the lyrica, pt notes that she was advised to increase lyrica to assist with seizure activity in hopes to later on decrease the use of Keppra, pt advised she will wait til MD Beverely Low comes back on Monday instead of speaking to another MD about her current question.please advise

## 2011-04-18 NOTE — Telephone Encounter (Signed)
Please note/advise prior phone notes

## 2011-04-18 NOTE — Telephone Encounter (Signed)
Patient called and stated she would like an increase in her Lyrica from 50mg  (which she says she on, we show 75MG ) to 100MG  Please call patient at [phone]

## 2011-04-18 NOTE — Telephone Encounter (Signed)
Ok to increase to 100mg  of neurontin

## 2011-04-20 NOTE — Telephone Encounter (Signed)
My apologies- i meant it is ok to increase to Lyrica 100mg .

## 2011-04-21 MED ORDER — PREGABALIN 100 MG PO CAPS: 100.0000 mg | ORAL_CAPSULE | Freq: Two times a day (BID) | ORAL | Status: DC

## 2011-04-21 NOTE — Telephone Encounter (Signed)
Called pt to clarify that the increase is for Lyrica 100mg , pt understood, rx faxed to pharmacy

## 2011-05-01 ENCOUNTER — Ambulatory Visit: Payer: 59 | Admitting: Family Medicine

## 2011-05-02 DIAGNOSIS — G4710 Hypersomnia, unspecified: Secondary | ICD-10-CM

## 2011-05-02 DIAGNOSIS — R0609 Other forms of dyspnea: Secondary | ICD-10-CM

## 2011-05-02 DIAGNOSIS — R0989 Other specified symptoms and signs involving the circulatory and respiratory systems: Secondary | ICD-10-CM

## 2011-05-02 DIAGNOSIS — G4730 Sleep apnea, unspecified: Secondary | ICD-10-CM

## 2011-05-02 NOTE — Procedures (Signed)
[name], [name]                  ACCOUNT NO.:  [id number]  MEDICAL RECORD NO.:  [id number]          PATIENT TYPE:  OUT  LOCATION:  SLEEP CENTER                 FACILITY:  [facility]  PHYSICIAN:  Barbaraann Share, MD,FCCPDATE OF BIRTH:  1955/01/16  DATE OF STUDY:  04/14/2011                           NOCTURNAL POLYSOMNOGRAM  REFERRING PHYSICIAN:  Barbaraann Share, MD,FCCP  LOCATION:  Sleep Lab.  REFERRING PHYSICIAN:  Barbaraann Share, MD, FCCP  INDICATION FOR STUDY:  Hypersomnia with sleep apnea.  EPWORTH SLEEPINESS SCORE:  7.  MEDICATIONS:  SLEEP ARCHITECTURE:  The patient had a total sleep time of 279 minutes with no slow-wave sleep or REM noted.  Sleep onset latency was normal at 11 minutes and sleep efficiency was moderately reduced at 73%.  RESPIRATORY DATA:  The patient was found to have no apneas and only 4 obstructive hypopneas, giving her an apnea-hypopnea index of 0.9 events per hour.  The events occur primarily in the supine position and there was moderate snoring noted throughout.  There were large numbers of nonspecific arousals noted.  OXYGEN DATA:  The patient was found to have O2 desaturation transiently as low as 89%.  CARDIAC DATA:  No clinically significant arrhythmias were seen.  MOVEMENT-PARASOMNIA:  The patient had very small numbers of leg jerks with no significant sleep disruption.  IMPRESSIONS-RECOMMENDATION: 1. Small numbers of obstructive events which do not meet the apnea-     hypopnea index criteria for the obstructive sleep apnea syndrome.     The patient did have moderate snoring and should be encouraged to     work aggressively on weight loss.  There was no significant oxygen     desaturation. 2. Very large numbers of spontaneous nonspecific arousals with no     obvious etiology noted.  This raises the question whether the     patient may have an issue with anxiety or depression that is     contributing to this.  Clinical     correlation is  suggested. 3. No evidence for nocturnal seizures or other abnormal behaviors.     Barbaraann Share, MD,FCCP Diplomate, American Board of Sleep Medicine    [facility]/MEDQ  D:  05/02/2011 08:15:48  T:  05/02/2011 11:30:00  Job:  [id number]

## 2011-05-05 ENCOUNTER — Other Ambulatory Visit: Payer: Self-pay | Admitting: Family Medicine

## 2011-05-05 ENCOUNTER — Telehealth: Payer: Self-pay | Admitting: Pulmonary Disease

## 2011-05-05 NOTE — Telephone Encounter (Signed)
Ok for #60 

## 2011-05-05 NOTE — Telephone Encounter (Signed)
Last OV no show on 05-01-11 last OV 02-04-11 last refill 01-29-11 #60 with 3 refills

## 2011-05-05 NOTE — Telephone Encounter (Signed)
Called pt and discussed results of sleep study with her.  She had no significant sleep disordered breathing, no arrhythmias, no significant leg jerks, no nocturnal seizures.  She did have very frequent awakenings that were nonspecific and not related to any physiologic event.   This raises the question whether anxiety may be playing a role here. She thinks it might.  I have asked her to discuss this issue with her primary md, and to work on weight loss.

## 2011-05-05 NOTE — Telephone Encounter (Signed)
Refill for Tramadol HCL 50MG  Tab #60 Take one tablet by mouth every 6-hours as needed Last filled 4.10.13  Last OV 4.25.13 NO SHOW, prior to this was 3.8.13

## 2011-05-06 MED ORDER — TRAMADOL HCL 50 MG PO TABS: 50.0000 mg | ORAL_TABLET | Freq: Four times a day (QID) | ORAL | Status: DC | PRN

## 2011-05-06 NOTE — Telephone Encounter (Signed)
rx sent to pharmacy by e-script  

## 2011-05-09 ENCOUNTER — Ambulatory Visit
Admission: RE | Admit: 2011-05-09 | Discharge: 2011-05-09 | Disposition: A | Discharge status: Home / Self Care | Payer: 59 | Source: Ambulatory Visit | Attending: Family Medicine | Admitting: Family Medicine

## 2011-05-09 DIAGNOSIS — Z1231 Encounter for screening mammogram for malignant neoplasm of breast: Secondary | ICD-10-CM

## 2011-05-16 ENCOUNTER — Encounter: Payer: Self-pay | Admitting: Family Medicine

## 2011-05-16 ENCOUNTER — Ambulatory Visit (INDEPENDENT_AMBULATORY_CARE_PROVIDER_SITE_OTHER): Payer: 59 | Admitting: Family Medicine

## 2011-05-16 VITALS — BP 125/83 | HR 92 | Temp 99.2°F | Ht 62.25 in | Wt 232.0 lb

## 2011-05-16 DIAGNOSIS — IMO0001 Reserved for inherently not codable concepts without codable children: Secondary | ICD-10-CM

## 2011-05-16 DIAGNOSIS — E785 Hyperlipidemia, unspecified: Secondary | ICD-10-CM

## 2011-05-16 DIAGNOSIS — M797 Fibromyalgia: Secondary | ICD-10-CM

## 2011-05-16 LAB — LIPID PANEL
HDL: 45.9 mg/dL (ref >39.00)
Total CHOL/HDL Ratio: 5

## 2011-05-16 LAB — HEPATIC FUNCTION PANEL
ALT: 19 U/L (ref 0–35)
Alkaline Phosphatase: 73 U/L (ref 39–117)
Bilirubin, Direct: 0 mg/dL (ref 0.0–0.3)
Total Bilirubin: 0.4 mg/dL (ref 0.3–1.2)
Total Protein: 7.1 g/dL (ref 6.0–8.3)

## 2011-05-16 LAB — LDL CHOLESTEROL, DIRECT: Direct LDL: 131.6 mg/dL

## 2011-05-16 MED ORDER — DULOXETINE HCL 30 MG PO CPEP: 30.0000 mg | ORAL_CAPSULE | Freq: Every day | ORAL | Status: DC

## 2011-05-16 MED ORDER — MELOXICAM 15 MG PO TABS: 15.0000 mg | ORAL_TABLET | Freq: Every day | ORAL | Status: DC

## 2011-05-16 NOTE — Assessment & Plan Note (Signed)
Chronic problem.  Tolerating statin w/out difficulty.  Due for labs.  Adjust meds prn. 

## 2011-05-16 NOTE — Progress Notes (Signed)
  Subjective:    Patient ID: [name], female    DOB: 1955-11-01, 56 y.o.   MRN: [id number]  HPI Fibromyalgia- 'i've been hurting for the past month and it's just getting worse and worse'.  Has been going to pain management.  When asked where she is hurting- 'everywhere'.  Had cortisone shot in L knee w/ some relief.  Has been on Savella.  Feels cymbalta might have been helpful.  Has never been on a daily anti-inflammatory.  Poor sleep.  + depressed mood.  Hyperlipidemia- chronic problem, on Crestor.  Denies abd pain, N/V.  Due for labs.   Review of Systems For ROS see HPI     Objective:   Physical Exam  Vitals reviewed. Constitutional: She is oriented to person, place, and time. She appears well-developed and well-nourished. No distress.  HENT:  Head: Normocephalic and atraumatic.  Eyes: Conjunctivae and EOM are normal. Pupils are equal, round, and reactive to light.  Neck: Normal range of motion. Neck supple. No thyromegaly present.  Cardiovascular: Normal rate, regular rhythm, normal heart sounds and intact distal pulses.   No murmur heard. Pulmonary/Chest: Effort normal and breath sounds normal. No respiratory distress.  Abdominal: Soft. She exhibits no distension. There is no tenderness.  Musculoskeletal: She exhibits no edema.  Lymphadenopathy:    She has no cervical adenopathy.  Neurological: She is alert and oriented to person, place, and time.  Skin: Skin is warm and dry.  Psychiatric: She has a normal mood and affect. Her behavior is normal.          Assessment & Plan:

## 2011-05-16 NOTE — Patient Instructions (Signed)
Follow up in 4-6 weeks to recheck pain and mood Start the Cymbalta daily Start the Mobic daily Try and walk daily to improve stiffness and mobility Call with any questions or concerns Hang in there! Happy Mother's Day

## 2011-05-16 NOTE — Assessment & Plan Note (Signed)
Deteriorated.  Start Cymbalta for both pain and depression.  Add Mobic daily for inflammation.  Will continue to follow closely.

## 2011-05-20 ENCOUNTER — Encounter: Payer: Self-pay | Admitting: *Deleted

## 2011-05-20 MED ORDER — FENOFIBRATE 160 MG PO TABS: 160.0000 mg | ORAL_TABLET | Freq: Every day | ORAL | Status: DC

## 2011-05-20 NOTE — Progress Notes (Signed)
Addended by: Derry Lory A on: 05/20/2011 08:41 AM   Modules accepted: Orders

## 2011-05-27 ENCOUNTER — Telehealth: Payer: Self-pay | Admitting: Family Medicine

## 2011-05-27 NOTE — Telephone Encounter (Signed)
Pt advised that she had a sleep study performed and was given the results via phone that she does not have sleep apnea, and that the sleep MD Clance was supposed to send the results to MD Tabori, noted 05-06-11 and wanted to know the next steps to take, pt also noted the she needs a refill for her tramadol per has 4 pills left, pt [facility], last refill noted 05-06-11 for #60 no refills

## 2011-05-27 NOTE — Telephone Encounter (Signed)
Pt has questions about her sleep study results. Call back @ [phone]

## 2011-05-28 MED ORDER — TRAMADOL HCL 50 MG PO TABS: 50.0000 mg | ORAL_TABLET | Freq: Four times a day (QID) | ORAL | Status: DC | PRN

## 2011-05-28 NOTE — Telephone Encounter (Signed)
rx sent to pharmacy by e-script for tramadol Spoke to pt to advise results/instructions. Pt understood.

## 2011-05-28 NOTE — Telephone Encounter (Signed)
If she does not have sleep apnea there is nothing to do [facility] for refill #60, 1 refill

## 2011-06-20 ENCOUNTER — Ambulatory Visit (INDEPENDENT_AMBULATORY_CARE_PROVIDER_SITE_OTHER): Payer: 59 | Admitting: Family Medicine

## 2011-06-20 ENCOUNTER — Encounter: Payer: Self-pay | Admitting: Family Medicine

## 2011-06-20 VITALS — BP 110/80 | HR 104 | Temp 98.9°F | Resp 16 | Ht 62.25 in | Wt 225.0 lb

## 2011-06-20 DIAGNOSIS — F329 Major depressive disorder, single episode, unspecified: Secondary | ICD-10-CM

## 2011-06-20 DIAGNOSIS — M797 Fibromyalgia: Secondary | ICD-10-CM

## 2011-06-20 DIAGNOSIS — IMO0001 Reserved for inherently not codable concepts without codable children: Secondary | ICD-10-CM

## 2011-06-20 DIAGNOSIS — G4700 Insomnia, unspecified: Secondary | ICD-10-CM

## 2011-06-20 MED ORDER — DULOXETINE HCL 60 MG PO CPEP: 60.0000 mg | ORAL_CAPSULE | Freq: Every day | ORAL | Status: DC

## 2011-06-20 MED ORDER — TRAZODONE HCL 50 MG PO TABS: 25.0000 mg | ORAL_TABLET | Freq: Every evening | ORAL | Status: DC | PRN

## 2011-06-20 NOTE — Progress Notes (Signed)
  Subjective:    Patient ID: [name], female    DOB: Jan 26, 1955, 56 y.o.   MRN: [id number]  HPI Fibromyalgia- started on Cymbalta 30mg  at last visit.  Has not noticed much improvement in pain.  Feels mood has improved.  Has never been on 60mg - willing to try.  Having poor sleep.  Has difficulty both falling asleep and staying asleep.  Has never been on sleep medicine.     Review of Systems For ROS see HPI     Objective:   Physical Exam  Vitals reviewed. Constitutional: [name] is oriented to person, place, and time. [name] appears well-developed and well-nourished. No distress.  Neurological: [name] is alert and oriented to person, place, and time.  Psychiatric:       Pt somewhat flat today but not tearful or distressed          Assessment & Plan:

## 2011-06-20 NOTE — Patient Instructions (Addendum)
Follow up in 2 months to recheck mood/pain Increase Cymbalta to 60mg  daily Start the Trazodone for sleep Call with any questions or concerns Hang in there!

## 2011-06-24 NOTE — Assessment & Plan Note (Signed)
Unchanged.  Pt doesn't feel pain level has changed since starting Cymbalta.  Will increase to 60 mg and monitor for improvement.  Pt expressed understanding and is in agreement w/ plan.

## 2011-06-24 NOTE — Assessment & Plan Note (Signed)
Chronic problem.  Start Trazodone and see if sxs improve.  Will follow.

## 2011-06-24 NOTE — Assessment & Plan Note (Signed)
Pt feels sxs may have improved.  Will increase dose of Cymbalta to 60 mg and assess for even more improvement.  Pt expressed understanding and is in agreement w/ plan.

## 2011-07-11 ENCOUNTER — Encounter (HOSPITAL_COMMUNITY): Payer: Self-pay | Admitting: *Deleted

## 2011-07-11 ENCOUNTER — Inpatient Hospital Stay (HOSPITAL_COMMUNITY)
Admission: EM | Admit: 2011-07-11 | Discharge: 2011-07-15 | DRG: 493 | Disposition: A | Discharge status: Skilled Nursing Facility | Payer: 59 | Attending: Orthopedic Surgery | Admitting: Orthopedic Surgery

## 2011-07-11 ENCOUNTER — Emergency Department (HOSPITAL_COMMUNITY): Payer: 59

## 2011-07-11 DIAGNOSIS — G40802 Other epilepsy, not intractable, without status epilepticus: Secondary | ICD-10-CM | POA: Diagnosis present

## 2011-07-11 DIAGNOSIS — Z85841 Personal history of malignant neoplasm of brain: Secondary | ICD-10-CM

## 2011-07-11 DIAGNOSIS — W19XXXA Unspecified fall, initial encounter: Secondary | ICD-10-CM | POA: Diagnosis present

## 2011-07-11 DIAGNOSIS — Y998 Other external cause status: Secondary | ICD-10-CM

## 2011-07-11 DIAGNOSIS — C711 Malignant neoplasm of frontal lobe: Secondary | ICD-10-CM | POA: Diagnosis present

## 2011-07-11 DIAGNOSIS — F329 Major depressive disorder, single episode, unspecified: Secondary | ICD-10-CM | POA: Diagnosis present

## 2011-07-11 DIAGNOSIS — S82401A Unspecified fracture of shaft of right fibula, initial encounter for closed fracture: Secondary | ICD-10-CM

## 2011-07-11 DIAGNOSIS — Y92009 Unspecified place in unspecified non-institutional (private) residence as the place of occurrence of the external cause: Secondary | ICD-10-CM

## 2011-07-11 DIAGNOSIS — F3289 Other specified depressive episodes: Secondary | ICD-10-CM | POA: Diagnosis present

## 2011-07-11 DIAGNOSIS — Z6841 Body Mass Index (BMI) 40.0 and over, adult: Secondary | ICD-10-CM

## 2011-07-11 DIAGNOSIS — K5900 Constipation, unspecified: Secondary | ICD-10-CM | POA: Diagnosis present

## 2011-07-11 DIAGNOSIS — IMO0001 Reserved for inherently not codable concepts without codable children: Secondary | ICD-10-CM | POA: Diagnosis present

## 2011-07-11 DIAGNOSIS — S82899A Other fracture of unspecified lower leg, initial encounter for closed fracture: Principal | ICD-10-CM | POA: Diagnosis present

## 2011-07-11 HISTORY — DX: Unspecified fracture of shaft of right fibula, initial encounter for closed fracture: S82.401A

## 2011-07-11 HISTORY — DX: Malignant neoplasm of frontal lobe: C71.1

## 2011-07-11 MED ORDER — LEVETIRACETAM 500 MG PO TABS
1500.0000 mg | ORAL_TABLET | Freq: Once | ORAL | Status: AC
  Administered 2011-07-11: 1500 mg via ORAL
  Filled 2011-07-11: qty 3

## 2011-07-11 MED ORDER — ONDANSETRON HCL 4 MG/2ML IJ SOLN
4.0000 mg | Freq: Once | INTRAMUSCULAR | Status: AC
  Administered 2011-07-11: 4 mg via INTRAVENOUS
  Filled 2011-07-11: qty 2

## 2011-07-11 MED ORDER — MORPHINE SULFATE 2 MG/ML IJ SOLN
2.0000 mg | INTRAMUSCULAR | Status: DC | PRN
  Administered 2011-07-12: 2 mg via INTRAVENOUS
  Filled 2011-07-11: qty 1

## 2011-07-11 MED ORDER — HYDROMORPHONE HCL PF 1 MG/ML IJ SOLN
1.0000 mg | Freq: Once | INTRAMUSCULAR | Status: AC
  Administered 2011-07-11: 1 mg via INTRAVENOUS
  Filled 2011-07-11: qty 1

## 2011-07-11 MED ORDER — OXYCODONE HCL 5 MG PO TABS: 10.0000 mg | ORAL_TABLET | ORAL | Status: DC | PRN

## 2011-07-11 MED ORDER — PREGABALIN 50 MG PO CAPS
100.0000 mg | ORAL_CAPSULE | Freq: Once | ORAL | Status: AC
  Administered 2011-07-11: 100 mg via ORAL
  Filled 2011-07-11: qty 2

## 2011-07-11 MED ORDER — HYDROMORPHONE HCL PF 1 MG/ML IJ SOLN
0.5000 mg | Freq: Once | INTRAMUSCULAR | Status: AC
  Administered 2011-07-11: 0.5 mg via INTRAVENOUS
  Filled 2011-07-11: qty 1

## 2011-07-11 NOTE — ED Notes (Signed)
Pt reports falling between two parked cars today. Pt lost balance and fell landing on buttock.  Pt reports pain to right ankle. Pt denies taking medications for pain.

## 2011-07-11 NOTE — Progress Notes (Signed)
Patient seen in ED.  Right ankle fracture, will plan for ORIF.    Ordered preop labs and ekg and cxr.   OR availability in am at [facility], not at [facility].  Will admit to [facility].  The risks benefits and alternatives were discussed with the patient including but not limited to the risks of nonoperative treatment, versus surgical intervention including infection, bleeding, nerve injury, malunion, nonunion, the need for revision surgery, hardware prominence, hardware failure, the need for hardware removal, blood clots, cardiopulmonary complications, morbidity, mortality, among others, and they were willing to proceed.    Eulas Post, MD

## 2011-07-11 NOTE — ED Provider Notes (Addendum)
Medical screening examination/treatment/procedure(s) were conducted as a shared visit with non-physician practitioner(s) and myself.  I personally evaluated the patient during the encounter  This is likely a severe fracture dislocation is now self reduced since presentation to the emergency department.  She does have significant swelling of her ankle and her distal calf.  Her foot looks slightly dusky but she has a strong DP pulse. Will use doppler to evaluate PT pulse  Dg Tibia/fibula Right  07/11/2011  *RADIOLOGY REPORT*  Clinical Data: Larey Seat and injured right ankle and right lower leg.  RIGHT TIBIA AND FIBULA - 2 VIEW  Comparison: Right ankle x-rays obtained concurrently.  Findings: Comminuted distal fibular fracture probable tiny avulsion fracture involving the medial malleolus at the ankle, detailed on that report.  No fractures elsewhere involving the tibia or fibula. Visualized knee joint intact.  Note made of a bone island in the distal femur.  IMPRESSION: Distal fibular fracture probable avulsion fracture arising from the medial malleolus as detailed on the report of the ankle imaging. No fractures elsewhere involving the tibia or fibula.  Original Report Authenticated By: Arnell Sieving, M.D.   Dg Ankle Complete Right  07/11/2011  *RADIOLOGY REPORT*  Clinical Data: Larey Seat and injured right ankle.  RIGHT ANKLE - COMPLETE 3+ VIEW  Comparison: Right tibia fibula x-rays obtained concurrently.  Findings: Comminuted oblique fracture involving the distal fibula. Probable tiny avulsion fracture involving the tip of the medial malleolus.  No other fractures.  Slight widening of the medial joint space.  Ankle mortise intact. Moderate sized joint effusion/hemarthrosis.  Small plantar calcaneal spur.  IMPRESSION:  1.  Comminuted oblique fracture involving the distal fibula and probable tiny avulsion fracture involving the medial malleolus. 2.  Slight widening of the medial joint space likely indicates injury to  the medial ligament complex. 3.  Small plantar calcaneal spur.  Original Report Authenticated By: Arnell Sieving, M.D.   I personally reviewed the imaging tests through PACS system  I reviewed available ER/hospitalization records thought the EMR   7:01 PM Spoke with Dr Shon Baton who will evaluate at the bedside. Recommends elevation and splinting at this time.     Lyanne Co, MD 07/11/11 1903  9:06 PM Dr Shon Baton will admit  Lyanne Co, MD 07/11/11 2106

## 2011-07-11 NOTE — H&P (Signed)
Leslie Rhymes, MD Chief Complaint: S/p fall with right ankle fracture History: Patient with history of falls who was at home and lost her balance.  Patient fell and noted immediate deformity of the right ankle.  Brought to ER.  Concern about fx/dislocation.   Patient self-reduced and was splinted.  Concern about vascularity given dis-colorization  Of the toes.   Noted by ER MD to have improved significantly with elevation.  Currently less pain.  Past Medical History  Diagnosis Date  . Brain cancer     Frontal lobe, 1993 and 2005  . Migraines   . Morton neuroma   . Seizures   . Migraine   . Fibromyalgia     Allergies  Allergen Reactions  . Ciprofloxacin     REACTION: NAUSEA AND VOMITING  . Cyclobenzaprine Hcl   . Methocarbamol     REACTION: itching  . Metronidazole   . Penicillins     REACTION: RASH    No current facility-administered medications on file prior to encounter.   Current Outpatient Prescriptions on File Prior to Encounter  Medication Sig Dispense Refill  . butalbital-aspirin-caffeine (FIORINAL) 50-325-40 MG per capsule Take 1 capsule by mouth every 6 (six) hours as needed.       . Calcium Citrate-Vitamin D (CITRACAL + D PO) Take by mouth 2 (two) times daily.        Marland Kitchen co-enzyme Q-10 30 MG capsule Take 50 mg by mouth every Monday, Wednesday, and Friday.       . DULoxetine (CYMBALTA) 60 MG capsule Take 1 capsule (60 mg total) by mouth daily.  30 capsule  11  . fenofibrate 160 MG tablet Take 1 tablet (160 mg total) by mouth daily.  30 tablet  3  . fish oil-omega-3 fatty acids 1000 MG capsule Take 1 g by mouth 2 (two) times daily.        Marland Kitchen levETIRAcetam (KEPPRA) 500 MG tablet Take 1,500 mg by mouth 2 (two) times daily.       . pregabalin (LYRICA) 100 MG capsule Take 1 capsule (100 mg total) by mouth 2 (two) times daily.  60 capsule  3  . promethazine (PHENERGAN) 25 MG tablet Take 1 tablet (25 mg total) by mouth every 8 (eight) hours as needed for nausea.  30 tablet   0  . rosuvastatin (CRESTOR) 10 MG tablet Take 10 mg by mouth every Monday, Wednesday, and Friday.       . traMADol (ULTRAM) 50 MG tablet Take 1 tablet (50 mg total) by mouth every 6 (six) hours as needed for pain.  60 tablet  1  . traZODone (DESYREL) 50 MG tablet Take 0.5-1 tablets (25-50 mg total) by mouth at bedtime as needed for sleep.  30 tablet  3  . mometasone (NASONEX) 50 MCG/ACT nasal spray Place 2 sprays into the nose daily as needed. For nasal congestion.      Marland Kitchen DISCONTD: gabapentin (NEURONTIN) 100 MG capsule         Physical Exam: Filed Vitals:   07/11/11 2000  BP: 133/85  Pulse: 96  Temp:   Resp:    A+O x 3 No sob/cp abd soft/nt Splint in place Prox. Calf soft/NT No pain with passive ROM of toes Cap refill < 2 sec No knee/hip pain  UE - FROM no gross crepitus/deformity    Image: Dg Tibia/fibula Right  07/11/2011  *RADIOLOGY REPORT*  Clinical Data: Larey Seat and injured right ankle and right lower leg.  RIGHT TIBIA AND FIBULA -  2 VIEW  Comparison: Right ankle x-rays obtained concurrently.  Findings: Comminuted distal fibular fracture probable tiny avulsion fracture involving the medial malleolus at the ankle, detailed on that report.  No fractures elsewhere involving the tibia or fibula. Visualized knee joint intact.  Note made of a bone island in the distal femur.  IMPRESSION: Distal fibular fracture probable avulsion fracture arising from the medial malleolus as detailed on the report of the ankle imaging. No fractures elsewhere involving the tibia or fibula.  Original Report Authenticated By: Arnell Sieving, M.D.   Dg Ankle Complete Right  07/11/2011  *RADIOLOGY REPORT*  Clinical Data: Larey Seat and injured right ankle.  RIGHT ANKLE - COMPLETE 3+ VIEW  Comparison: Right tibia fibula x-rays obtained concurrently.  Findings: Comminuted oblique fracture involving the distal fibula. Probable tiny avulsion fracture involving the tip of the medial malleolus.  No other fractures.   Slight widening of the medial joint space.  Ankle mortise intact. Moderate sized joint effusion/hemarthrosis.  Small plantar calcaneal spur.  IMPRESSION:  1.  Comminuted oblique fracture involving the distal fibula and probable tiny avulsion fracture involving the medial malleolus. 2.  Slight widening of the medial joint space likely indicates injury to the medial ligament complex. 3.  Small plantar calcaneal spur.  Original Report Authenticated By: Arnell Sieving, M.D.    A/P: S/p fall with right ankle fx.  Concern about compartment syndrome.   No pain with passive stretch, cap refill < 2 sec. NO evidence of compartment syndrome at present.   Patient known to [facility]OS.  Spoke with Dr Dion Saucier  Patient with history of fibromyalgia as well as generalized weakness.   Concern about increased fall risk.  Will admit the patient for surgical management per Dr Dion Saucier Pain medication, elevation, and ice Bedrest - NWB right LE.

## 2011-07-11 NOTE — ED Provider Notes (Signed)
History     CSN: [id number]  Arrival date & time 07/11/11  1709   First MD Initiated Contact with Patient 07/11/11 1727      Chief Complaint  Patient presents with  . Fall  . Ankle Pain    (Consider location/radiation/quality/duration/timing/severity/associated sxs/prior treatment) HPI Comments: Patient here s/p fall today - her husband states that she was trying to get into the car and had her left foot in the compartment when she lost her balance and fell onto the right ankle with the foot and ankle underneath her - she fell onto her right buttock, reports pain and swelling to right ankle - has not been ambulatory - denies numbness or tingling to the site.  Patient is a 56 y.o. female presenting with fall and ankle pain. The history is provided by the patient and the spouse. No language interpreter was used.  Fall The accident occurred 1 to 2 hours ago. She fell from a height of 1 to 2 ft. She landed on concrete. There was no blood loss. Point of impact: right ankle. Pain location: right ankle. The pain is at a severity of 5/10. The pain is moderate. She was not ambulatory at the scene. There was no entrapment after the fall. There was no drug use involved in the accident. There was no alcohol use involved in the accident. Pertinent negatives include no visual change, no fever, no numbness, no abdominal pain, no bowel incontinence, no nausea, no vomiting, no hematuria, no headaches, no hearing loss, no loss of consciousness and no tingling. The symptoms are aggravated by activity.  Ankle Pain  Pertinent negatives include no numbness and no tingling.    Past Medical History  Diagnosis Date  . Brain cancer     Frontal lobe, 1993 and 2005  . Migraines   . Morton neuroma   . Seizures   . Migraine   . Fibromyalgia     Past Surgical History  Procedure Date  . Appendectomy 1976  . Excision morton's neuroma     right foot  . Brain cancer     1993 and 2005    Family History    Problem Relation Age of Onset  . Diabetes Mother   . Diabetes Brother   . Hypertension Mother   . Hypertension Father     History  Substance Use Topics  . Smoking status: Never Smoker   . Smokeless tobacco: Never Used  . Alcohol Use: No    OB History    Grav Para Term Preterm Abortions TAB SAB Ect Mult Living                  Review of Systems  Constitutional: Negative for fever.  HENT: Negative for neck pain.   Eyes: Negative for pain.  Respiratory: Negative for chest tightness and shortness of breath.   Cardiovascular: Negative for chest pain.  Gastrointestinal: Negative for nausea, vomiting, abdominal pain and bowel incontinence.  Genitourinary: Negative for dysuria and hematuria.  Musculoskeletal: Positive for arthralgias and gait problem.  Skin: Negative for color change.  Neurological: Negative for tingling, loss of consciousness, numbness and headaches.  All other systems reviewed and are negative.    Allergies  Ciprofloxacin; Cyclobenzaprine hcl; Methocarbamol; Metronidazole; and Penicillins  Home Medications   Current Outpatient Rx  Name Route Sig Dispense Refill  . BUTALBITAL-ASA-CAFFEINE 50-325-40 MG PO CAPS Oral Take 1 capsule by mouth every 6 (six) hours as needed.     Marland Kitchen CITRACAL + D PO Oral  Take by mouth 2 (two) times daily.      Marland Kitchen COENZYME Q10 30 MG PO CAPS Oral Take 50 mg by mouth every Monday, Wednesday, and Friday.     . DULOXETINE HCL 60 MG PO CPEP Oral Take 1 capsule (60 mg total) by mouth daily. 30 capsule 11  . FENOFIBRATE 160 MG PO TABS Oral Take 1 tablet (160 mg total) by mouth daily. 30 tablet 3  . OMEGA-3 FATTY ACIDS 1000 MG PO CAPS Oral Take 1 g by mouth 2 (two) times daily.      Marland Kitchen LEVETIRACETAM 500 MG PO TABS Oral Take 1,500 mg by mouth 2 (two) times daily.     . MELOXICAM 15 MG PO TABS Oral Take 15 mg by mouth daily as needed. For pain.    Marland Kitchen PREGABALIN 100 MG PO CAPS Oral Take 1 capsule (100 mg total) by mouth 2 (two) times daily. 60  capsule 3  . PROMETHAZINE HCL 25 MG PO TABS Oral Take 1 tablet (25 mg total) by mouth every 8 (eight) hours as needed for nausea. 30 tablet 0  . ROSUVASTATIN CALCIUM 10 MG PO TABS Oral Take 10 mg by mouth every Monday, Wednesday, and Friday.     . TRAMADOL HCL 50 MG PO TABS Oral Take 1 tablet (50 mg total) by mouth every 6 (six) hours as needed for pain. 60 tablet 1  . TRAZODONE HCL 50 MG PO TABS Oral Take 0.5-1 tablets (25-50 mg total) by mouth at bedtime as needed for sleep. 30 tablet 3  . MOMETASONE FUROATE 50 MCG/ACT NA SUSP Nasal Place 2 sprays into the nose daily as needed. For nasal congestion.      BP 124/87  Pulse 94  Temp 98 F (36.7 C)  Resp 16  SpO2 98%  Physical Exam  Nursing note and vitals reviewed. Constitutional: She is oriented to person, place, and time. She appears well-developed and well-nourished. No distress.  HENT:  Head: Normocephalic and atraumatic.  Right Ear: External ear normal.  Left Ear: External ear normal.  Nose: Nose normal.  Mouth/Throat: Oropharynx is clear and moist. No oropharyngeal exudate.  Eyes: Conjunctivae are normal. Pupils are equal, round, and reactive to light. No scleral icterus.  Neck: Normal range of motion. Neck supple.  Cardiovascular: Normal rate, regular rhythm and normal heart sounds.  Exam reveals no gallop and no friction rub.   No murmur heard. Pulmonary/Chest: Breath sounds normal. No respiratory distress. She has no wheezes. She has no rales. She exhibits no tenderness.  Abdominal: Soft. Bowel sounds are normal. She exhibits no distension. There is no tenderness. There is no rebound.  Musculoskeletal:       Marked edema noted to the right ankle - 2+ DP pulse unable to palpate PT due to swelling - ankle taut, slightly dusky in color distal to the area - pain with palpation to medial and lateral malleolus, sensation intact distally - calf compartments soft  Lymphadenopathy:    She has no cervical adenopathy.  Neurological:  She is alert and oriented to person, place, and time. No cranial nerve deficit. She exhibits normal muscle tone. Coordination normal.  Skin: Skin is warm and dry. No rash noted. There is pallor.  Psychiatric: She has a normal mood and affect. Her behavior is normal. Judgment and thought content normal.    ED Course  Procedures (including critical care time)  Labs Reviewed - No data to display No results found.   Results for orders placed in visit  on 05/16/11  HEPATIC FUNCTION PANEL      Component Value Range   Total Bilirubin 0.4  0.3 - 1.2 mg/dL   Bilirubin, Direct 0.0  0.0 - 0.3 mg/dL   Alkaline Phosphatase 73  39 - 117 U/L   AST 23  0 - 37 U/L   ALT 19  0 - 35 U/L   Total Protein 7.1  6.0 - 8.3 g/dL   Albumin 3.8  3.5 - 5.2 g/dL  LIPID PANEL      Component Value Range   Cholesterol 208 (*) 0 - 200 mg/dL   Triglycerides 161.0 (*) 0.0 - 149.0 mg/dL   HDL 96.04  >54.09 mg/dL   VLDL 81.1 (*) 0.0 - 91.4 mg/dL   Total CHOL/HDL Ratio 5    LDL CHOLESTEROL, DIRECT      Component Value Range   Direct LDL 131.6     Dg Tibia/fibula Right  07/11/2011  *RADIOLOGY REPORT*  Clinical Data: Larey Seat and injured right ankle and right lower leg.  RIGHT TIBIA AND FIBULA - 2 VIEW  Comparison: Right ankle x-rays obtained concurrently.  Findings: Comminuted distal fibular fracture probable tiny avulsion fracture involving the medial malleolus at the ankle, detailed on that report.  No fractures elsewhere involving the tibia or fibula. Visualized knee joint intact.  Note made of a bone island in the distal femur.  IMPRESSION: Distal fibular fracture probable avulsion fracture arising from the medial malleolus as detailed on the report of the ankle imaging. No fractures elsewhere involving the tibia or fibula.  Original Report Authenticated By: Arnell Sieving, M.D.   Dg Ankle Complete Right  07/11/2011  *RADIOLOGY REPORT*  Clinical Data: Larey Seat and injured right ankle.  RIGHT ANKLE - COMPLETE 3+ VIEW   Comparison: Right tibia fibula x-rays obtained concurrently.  Findings: Comminuted oblique fracture involving the distal fibula. Probable tiny avulsion fracture involving the tip of the medial malleolus.  No other fractures.  Slight widening of the medial joint space.  Ankle mortise intact. Moderate sized joint effusion/hemarthrosis.  Small plantar calcaneal spur.  IMPRESSION:  1.  Comminuted oblique fracture involving the distal fibula and probable tiny avulsion fracture involving the medial malleolus. 2.  Slight widening of the medial joint space likely indicates injury to the medial ligament complex. 3.  Small plantar calcaneal spur.  Original Report Authenticated By: Arnell Sieving, M.D.    Bimalleolar right ankle fracture with likely tendon disruption   MDM  Patient transferred to the main ER and under the care of Dr. Patria Mane who has spoken with Dr. Shon Baton with orthopedics - after elevation the patient's color began to return to the foot - Dr. Patria Mane was able to doppler a PT pulse.        [name] [place], [place] 07/11/11 2206

## 2011-07-11 NOTE — ED Provider Notes (Signed)
Medical screening examination/treatment/procedure(s) were conducted as a shared visit with non-physician practitioner(s) and myself.  I personally evaluated the patient during the encounter  Please see my other note  Lyanne Co, MD 07/11/11 2234

## 2011-07-11 NOTE — ED Notes (Signed)
Ortho notified regarding splint orders.

## 2011-07-12 ENCOUNTER — Inpatient Hospital Stay (HOSPITAL_COMMUNITY): Payer: 59 | Admitting: Anesthesiology

## 2011-07-12 ENCOUNTER — Inpatient Hospital Stay (HOSPITAL_COMMUNITY): Payer: 59

## 2011-07-12 ENCOUNTER — Encounter (HOSPITAL_COMMUNITY): Payer: Self-pay | Admitting: Orthopedic Surgery

## 2011-07-12 ENCOUNTER — Encounter (HOSPITAL_COMMUNITY)
Admission: EM | Disposition: A | Discharge status: Skilled Nursing Facility | Payer: Self-pay | Source: Home / Self Care | Attending: Orthopedic Surgery

## 2011-07-12 ENCOUNTER — Encounter (HOSPITAL_COMMUNITY): Payer: Self-pay | Admitting: Anesthesiology

## 2011-07-12 DIAGNOSIS — S82401A Unspecified fracture of shaft of right fibula, initial encounter for closed fracture: Secondary | ICD-10-CM

## 2011-07-12 HISTORY — PX: ORIF ANKLE FRACTURE: SHX5408

## 2011-07-12 HISTORY — DX: Unspecified fracture of shaft of right fibula, initial encounter for closed fracture: S82.401A

## 2011-07-12 LAB — BASIC METABOLIC PANEL WITH GFR
BUN: 16 mg/dL (ref 6–23)
CO2: 25 meq/L (ref 19–32)
Calcium: 9 mg/dL (ref 8.4–10.5)
Chloride: 109 meq/L (ref 96–112)
Creatinine, Ser: 1.01 mg/dL (ref 0.50–1.10)
GFR calc Af Amer: 72 mL/min — ABNORMAL LOW
GFR calc non Af Amer: 62 mL/min — ABNORMAL LOW
Glucose, Bld: 97 mg/dL (ref 70–99)
Potassium: 4.1 meq/L (ref 3.5–5.1)
Sodium: 144 meq/L (ref 135–145)

## 2011-07-12 LAB — URINALYSIS, ROUTINE W REFLEX MICROSCOPIC
Bilirubin Urine: NEGATIVE
Glucose, UA: NEGATIVE mg/dL
Ketones, ur: NEGATIVE mg/dL
Nitrite: NEGATIVE
Protein, ur: NEGATIVE mg/dL
Specific Gravity, Urine: 1.018 (ref 1.005–1.030)
Urobilinogen, UA: 0.2 mg/dL (ref 0.0–1.0)
pH: 6 (ref 5.0–8.0)

## 2011-07-12 LAB — URINE MICROSCOPIC-ADD ON

## 2011-07-12 LAB — CBC
MCH: 28 pg (ref 26.0–34.0)
MCV: 88.1 fL (ref 78.0–100.0)
Platelets: 206 10*3/uL (ref 150–400)
RDW: 15 % (ref 11.5–15.5)
WBC: 4.4 10*3/uL (ref 4.0–10.5)

## 2011-07-12 LAB — SURGICAL PCR SCREEN: MRSA, PCR: NEGATIVE

## 2011-07-12 LAB — APTT: aPTT: 33 s (ref 24–37)

## 2011-07-12 LAB — GLUCOSE, CAPILLARY: Glucose-Capillary: 88 mg/dL (ref 70–99)

## 2011-07-12 LAB — PROTIME-INR: Prothrombin Time: 14.6 seconds (ref 11.6–15.2)

## 2011-07-12 SURGERY — OPEN REDUCTION INTERNAL FIXATION (ORIF) ANKLE FRACTURE
Anesthesia: General | Site: Ankle | Laterality: Right | Wound class: Clean

## 2011-07-12 MED ORDER — DULOXETINE HCL 60 MG PO CPEP
60.0000 mg | ORAL_CAPSULE | Freq: Every day | ORAL | Status: DC
  Administered 2011-07-12 – 2011-07-15 (×4): 60 mg via ORAL
  Filled 2011-07-12 (×4): qty 1

## 2011-07-12 MED ORDER — FLUTICASONE PROPIONATE 50 MCG/ACT NA SUSP
2.0000 | Freq: Every day | NASAL | Status: DC
  Filled 2011-07-12: qty 16

## 2011-07-12 MED ORDER — ONDANSETRON HCL 4 MG/2ML IJ SOLN
4.0000 mg | Freq: Four times a day (QID) | INTRAMUSCULAR | Status: DC | PRN
  Administered 2011-07-12: 4 mg via INTRAVENOUS

## 2011-07-12 MED ORDER — TRAMADOL HCL 50 MG PO TABS
50.0000 mg | ORAL_TABLET | Freq: Four times a day (QID) | ORAL | Status: DC | PRN
  Administered 2011-07-12 – 2011-07-15 (×5): 50 mg via ORAL
  Filled 2011-07-12 (×5): qty 1

## 2011-07-12 MED ORDER — MORPHINE SULFATE 2 MG/ML IJ SOLN: 0.5000 mg | INTRAMUSCULAR | Status: DC | PRN

## 2011-07-12 MED ORDER — MIDAZOLAM HCL 5 MG/5ML IJ SOLN
INTRAMUSCULAR | Status: DC | PRN
  Administered 2011-07-12: 2 mg via INTRAVENOUS

## 2011-07-12 MED ORDER — ACETAMINOPHEN 650 MG RE SUPP: 650.0000 mg | Freq: Four times a day (QID) | RECTAL | Status: DC | PRN

## 2011-07-12 MED ORDER — COENZYME Q10 30 MG PO CAPS: 60.0000 mg | ORAL_CAPSULE | ORAL | Status: DC

## 2011-07-12 MED ORDER — BUTALBITAL-APAP-CAFFEINE 50-325-40 MG PO TABS: 1.0000 | ORAL_TABLET | Freq: Four times a day (QID) | ORAL | Status: DC | PRN

## 2011-07-12 MED ORDER — OXYCODONE HCL 5 MG PO TABS
5.0000 mg | ORAL_TABLET | ORAL | Status: DC | PRN
  Administered 2011-07-12 (×2): 10 mg via ORAL
  Filled 2011-07-12 (×2): qty 2

## 2011-07-12 MED ORDER — OXYCODONE-ACETAMINOPHEN 10-325 MG PO TABS: 1.0000 | ORAL_TABLET | Freq: Four times a day (QID) | ORAL | Status: DC | PRN

## 2011-07-12 MED ORDER — 0.9 % SODIUM CHLORIDE (POUR BTL) OPTIME
TOPICAL | Status: DC | PRN
  Administered 2011-07-12: 1000 mL

## 2011-07-12 MED ORDER — BUTALBITAL-APAP-CAFFEINE 50-325-40 MG PO TABS
1.0000 | ORAL_TABLET | Freq: Four times a day (QID) | ORAL | Status: DC | PRN
  Filled 2011-07-12: qty 1

## 2011-07-12 MED ORDER — ONDANSETRON HCL 4 MG/2ML IJ SOLN
4.0000 mg | Freq: Once | INTRAMUSCULAR | Status: AC | PRN
  Filled 2011-07-12: qty 2

## 2011-07-12 MED ORDER — METOCLOPRAMIDE HCL 10 MG PO TABS: 5.0000 mg | ORAL_TABLET | Freq: Three times a day (TID) | ORAL | Status: DC | PRN

## 2011-07-12 MED ORDER — MORPHINE SULFATE 2 MG/ML IJ SOLN
0.5000 mg | INTRAMUSCULAR | Status: DC | PRN
  Administered 2011-07-13 (×3): 0.5 mg via INTRAVENOUS
  Filled 2011-07-12 (×3): qty 1

## 2011-07-12 MED ORDER — METHOCARBAMOL 100 MG/ML IJ SOLN: 500.0000 mg | Freq: Four times a day (QID) | INTRAVENOUS | Status: DC | PRN

## 2011-07-12 MED ORDER — LACTATED RINGERS IV SOLN: INTRAVENOUS | Status: DC

## 2011-07-12 MED ORDER — METOCLOPRAMIDE HCL 5 MG/ML IJ SOLN
5.0000 mg | Freq: Three times a day (TID) | INTRAMUSCULAR | Status: DC | PRN
  Administered 2011-07-12: 10 mg via INTRAVENOUS
  Filled 2011-07-12: qty 2

## 2011-07-12 MED ORDER — PREGABALIN 50 MG PO CAPS
100.0000 mg | ORAL_CAPSULE | Freq: Two times a day (BID) | ORAL | Status: DC
  Filled 2011-07-12: qty 1

## 2011-07-12 MED ORDER — PROPOFOL 10 MG/ML IV EMUL
INTRAVENOUS | Status: DC | PRN
  Administered 2011-07-12: 50 mg via INTRAVENOUS
  Administered 2011-07-12: 120 mg via INTRAVENOUS

## 2011-07-12 MED ORDER — POLYETHYLENE GLYCOL 3350 17 G PO PACK: 17.0000 g | PACK | Freq: Every day | ORAL | Status: DC | PRN

## 2011-07-12 MED ORDER — ENOXAPARIN SODIUM 40 MG/0.4ML ~~LOC~~ SOLN
40.0000 mg | Freq: Every day | SUBCUTANEOUS | Status: DC
  Administered 2011-07-12 – 2011-07-14 (×3): 40 mg via SUBCUTANEOUS
  Filled 2011-07-12 (×4): qty 0.4

## 2011-07-12 MED ORDER — DULOXETINE HCL 60 MG PO CPEP
60.0000 mg | ORAL_CAPSULE | Freq: Every day | ORAL | Status: DC
  Filled 2011-07-12: qty 1

## 2011-07-12 MED ORDER — ATORVASTATIN CALCIUM 20 MG PO TABS
20.0000 mg | ORAL_TABLET | Freq: Every day | ORAL | Status: DC
  Filled 2011-07-12: qty 1

## 2011-07-12 MED ORDER — NEOSTIGMINE METHYLSULFATE 1 MG/ML IJ SOLN
INTRAMUSCULAR | Status: DC | PRN
  Administered 2011-07-12: 5 mg via INTRAVENOUS

## 2011-07-12 MED ORDER — ZOLPIDEM TARTRATE 5 MG PO TABS
5.0000 mg | ORAL_TABLET | Freq: Every evening | ORAL | Status: DC | PRN
  Administered 2011-07-13: 5 mg via ORAL
  Filled 2011-07-12: qty 1

## 2011-07-12 MED ORDER — TRAZODONE 25 MG HALF TABLET
25.0000 mg | ORAL_TABLET | Freq: Every evening | ORAL | Status: DC | PRN
  Administered 2011-07-13: 50 mg via ORAL
  Filled 2011-07-12: qty 2

## 2011-07-12 MED ORDER — PREGABALIN 50 MG PO CAPS
100.0000 mg | ORAL_CAPSULE | Freq: Two times a day (BID) | ORAL | Status: DC
  Administered 2011-07-12 – 2011-07-15 (×6): 100 mg via ORAL
  Filled 2011-07-12 (×5): qty 2
  Filled 2011-07-12: qty 1
  Filled 2011-07-12 (×2): qty 2

## 2011-07-12 MED ORDER — VANCOMYCIN HCL IN DEXTROSE 1-5 GM/200ML-% IV SOLN
1000.0000 mg | INTRAVENOUS | Status: DC
  Filled 2011-07-12: qty 200

## 2011-07-12 MED ORDER — CEFAZOLIN SODIUM-DEXTROSE 2-3 GM-% IV SOLR
2.0000 g | Freq: Four times a day (QID) | INTRAVENOUS | Status: AC
  Administered 2011-07-12 (×2): 2 g via INTRAVENOUS
  Filled 2011-07-12 (×2): qty 50

## 2011-07-12 MED ORDER — FENOFIBRATE 160 MG PO TABS
160.0000 mg | ORAL_TABLET | Freq: Every day | ORAL | Status: DC
  Filled 2011-07-12: qty 1

## 2011-07-12 MED ORDER — ONDANSETRON HCL 4 MG PO TABS: 4.0000 mg | ORAL_TABLET | Freq: Four times a day (QID) | ORAL | Status: DC | PRN

## 2011-07-12 MED ORDER — POTASSIUM CHLORIDE IN NACL 20-0.45 MEQ/L-% IV SOLN
INTRAVENOUS | Status: DC
  Administered 2011-07-12: 75 mL via INTRAVENOUS
  Filled 2011-07-12 (×2): qty 1000

## 2011-07-12 MED ORDER — PHENOL 1.4 % MT LIQD: 1.0000 | OROMUCOSAL | Status: DC | PRN

## 2011-07-12 MED ORDER — PROMETHAZINE HCL 25 MG PO TABS: 25.0000 mg | ORAL_TABLET | Freq: Three times a day (TID) | ORAL | Status: DC | PRN

## 2011-07-12 MED ORDER — ENOXAPARIN SODIUM 40 MG/0.4ML ~~LOC~~ SOLN: 40.0000 mg | SUBCUTANEOUS | Status: DC

## 2011-07-12 MED ORDER — SENNA 8.6 MG PO TABS
1.0000 | ORAL_TABLET | Freq: Two times a day (BID) | ORAL | Status: DC
  Administered 2011-07-12 – 2011-07-15 (×6): 8.6 mg via ORAL
  Filled 2011-07-12 (×8): qty 1

## 2011-07-12 MED ORDER — FENOFIBRATE 160 MG PO TABS
160.0000 mg | ORAL_TABLET | Freq: Every day | ORAL | Status: DC
  Administered 2011-07-12 – 2011-07-15 (×4): 160 mg via ORAL
  Filled 2011-07-12 (×4): qty 1

## 2011-07-12 MED ORDER — SUFENTANIL CITRATE 50 MCG/ML IV SOLN
INTRAVENOUS | Status: DC | PRN
  Administered 2011-07-12: 5 ug via INTRAVENOUS

## 2011-07-12 MED ORDER — ZOLPIDEM TARTRATE 5 MG PO TABS: 5.0000 mg | ORAL_TABLET | Freq: Every evening | ORAL | Status: DC | PRN

## 2011-07-12 MED ORDER — PHENYLEPHRINE HCL 10 MG/ML IJ SOLN
INTRAMUSCULAR | Status: DC | PRN
  Administered 2011-07-12: 80 ug via INTRAVENOUS
  Administered 2011-07-12 (×2): 40 ug via INTRAVENOUS
  Administered 2011-07-12: 80 ug via INTRAVENOUS

## 2011-07-12 MED ORDER — LEVETIRACETAM 750 MG PO TABS
1500.0000 mg | ORAL_TABLET | Freq: Two times a day (BID) | ORAL | Status: DC
  Filled 2011-07-12 (×3): qty 2

## 2011-07-12 MED ORDER — ACETAMINOPHEN 325 MG PO TABS
650.0000 mg | ORAL_TABLET | Freq: Four times a day (QID) | ORAL | Status: DC | PRN
  Administered 2011-07-13 – 2011-07-15 (×3): 650 mg via ORAL
  Filled 2011-07-12 (×3): qty 2

## 2011-07-12 MED ORDER — OXYCODONE HCL 5 MG PO TABS
5.0000 mg | ORAL_TABLET | ORAL | Status: DC | PRN
  Administered 2011-07-12: 10 mg via ORAL
  Administered 2011-07-12 (×2): 5 mg via ORAL
  Filled 2011-07-12: qty 1
  Filled 2011-07-12 (×2): qty 2
  Filled 2011-07-12: qty 1

## 2011-07-12 MED ORDER — LEVETIRACETAM 750 MG PO TABS
1500.0000 mg | ORAL_TABLET | Freq: Two times a day (BID) | ORAL | Status: DC
  Administered 2011-07-12 – 2011-07-15 (×7): 1500 mg via ORAL
  Filled 2011-07-12 (×8): qty 2

## 2011-07-12 MED ORDER — METHOCARBAMOL 500 MG PO TABS: 500.0000 mg | ORAL_TABLET | Freq: Four times a day (QID) | ORAL | Status: DC | PRN

## 2011-07-12 MED ORDER — FLUTICASONE PROPIONATE 50 MCG/ACT NA SUSP
2.0000 | Freq: Every day | NASAL | Status: DC
  Administered 2011-07-13 – 2011-07-14 (×2): 2 via NASAL

## 2011-07-12 MED ORDER — HYDROMORPHONE HCL PF 1 MG/ML IJ SOLN
INTRAMUSCULAR | Status: AC
  Filled 2011-07-12: qty 1

## 2011-07-12 MED ORDER — PROMETHAZINE HCL 25 MG PO TABS
25.0000 mg | ORAL_TABLET | Freq: Three times a day (TID) | ORAL | Status: DC | PRN
  Administered 2011-07-12: 25 mg via ORAL
  Filled 2011-07-12: qty 1

## 2011-07-12 MED ORDER — TRAMADOL HCL 50 MG PO TABS: 50.0000 mg | ORAL_TABLET | Freq: Four times a day (QID) | ORAL | Status: DC | PRN

## 2011-07-12 MED ORDER — TRAZODONE 25 MG HALF TABLET
25.0000 mg | ORAL_TABLET | Freq: Every evening | ORAL | Status: DC | PRN
Start: 2011-07-12 — End: 2011-07-12

## 2011-07-12 MED ORDER — ONDANSETRON HCL 4 MG/2ML IJ SOLN
INTRAMUSCULAR | Status: DC | PRN
  Administered 2011-07-12: 4 mg via INTRAVENOUS

## 2011-07-12 MED ORDER — GLYCOPYRROLATE 0.2 MG/ML IJ SOLN
INTRAMUSCULAR | Status: DC | PRN
  Administered 2011-07-12: .6 mg via INTRAVENOUS

## 2011-07-12 MED ORDER — DOCUSATE SODIUM 100 MG PO CAPS
100.0000 mg | ORAL_CAPSULE | Freq: Two times a day (BID) | ORAL | Status: DC
  Administered 2011-07-12 – 2011-07-15 (×6): 100 mg via ORAL
  Filled 2011-07-12 (×8): qty 1

## 2011-07-12 MED ORDER — LACTATED RINGERS IV SOLN
INTRAVENOUS | Status: DC | PRN
  Administered 2011-07-12: 08:00:00 via INTRAVENOUS

## 2011-07-12 MED ORDER — ATORVASTATIN CALCIUM 20 MG PO TABS
20.0000 mg | ORAL_TABLET | Freq: Every day | ORAL | Status: DC
  Administered 2011-07-12 – 2011-07-14 (×3): 20 mg via ORAL
  Filled 2011-07-12 (×4): qty 1

## 2011-07-12 MED ORDER — CEFAZOLIN SODIUM-DEXTROSE 2-3 GM-% IV SOLR
2.0000 g | INTRAVENOUS | Status: AC
  Administered 2011-07-12: 2 g via INTRAVENOUS
  Filled 2011-07-12: qty 50

## 2011-07-12 MED ORDER — POTASSIUM CHLORIDE IN NACL 20-0.45 MEQ/L-% IV SOLN
INTRAVENOUS | Status: DC
  Administered 2011-07-12 – 2011-07-13 (×3): via INTRAVENOUS
  Filled 2011-07-12 (×8): qty 1000

## 2011-07-12 MED ORDER — ROCURONIUM BROMIDE 100 MG/10ML IV SOLN
INTRAVENOUS | Status: DC | PRN
  Administered 2011-07-12: 50 mg via INTRAVENOUS

## 2011-07-12 MED ORDER — SORBITOL 70 % SOLN: 30.0000 mL | Freq: Every day | Status: DC | PRN

## 2011-07-12 MED ORDER — ALUM & MAG HYDROXIDE-SIMETH 200-200-20 MG/5ML PO SUSP: 30.0000 mL | ORAL | Status: DC | PRN

## 2011-07-12 MED ORDER — HYDROMORPHONE HCL PF 1 MG/ML IJ SOLN
0.2500 mg | INTRAMUSCULAR | Status: DC | PRN
  Administered 2011-07-12 – 2011-07-13 (×5): 0.5 mg via INTRAVENOUS
  Filled 2011-07-12 (×2): qty 1

## 2011-07-12 MED ORDER — LIDOCAINE HCL (CARDIAC) 20 MG/ML IV SOLN
INTRAVENOUS | Status: DC | PRN
  Administered 2011-07-12: 30 mg via INTRAVENOUS

## 2011-07-12 MED ORDER — MENTHOL 3 MG MT LOZG: 1.0000 | LOZENGE | OROMUCOSAL | Status: DC | PRN

## 2011-07-12 MED ORDER — ACETAMINOPHEN 10 MG/ML IV SOLN: 1000.0000 mg | Freq: Once | INTRAVENOUS | Status: AC | PRN

## 2011-07-12 SURGICAL SUPPLY — 59 items
BANDAGE ELASTIC 4 VELCRO ST LF (GAUZE/BANDAGES/DRESSINGS) ×2 IMPLANT
BANDAGE ELASTIC 6 VELCRO ST LF (GAUZE/BANDAGES/DRESSINGS) ×4 IMPLANT
BANDAGE ESMARK 6X9 LF (GAUZE/BANDAGES/DRESSINGS) ×1 IMPLANT
BENZOIN TINCTURE PRP APPL 2/3 (GAUZE/BANDAGES/DRESSINGS) ×2 IMPLANT
BIT DRILL 2.5X110 QC LCP DISP (BIT) ×2 IMPLANT
BIT DRILL QC 3.5X110 (BIT) ×2 IMPLANT
BNDG ESMARK 6X9 LF (GAUZE/BANDAGES/DRESSINGS) ×2
BOOTCOVER CLEANROOM LRG (PROTECTIVE WEAR) IMPLANT
CLOTH BEACON ORANGE TIMEOUT ST (SAFETY) ×2 IMPLANT
COVER SURGICAL LIGHT HANDLE (MISCELLANEOUS) ×2 IMPLANT
CUFF TOURNIQUET SINGLE 34IN LL (TOURNIQUET CUFF) IMPLANT
CUFF TOURNIQUET SINGLE 44IN (TOURNIQUET CUFF) IMPLANT
DECANTER SPIKE VIAL GLASS SM (MISCELLANEOUS) IMPLANT
DRAPE C-ARM 42X72 X-RAY (DRAPES) IMPLANT
DRAPE OEC MINIVIEW 54X84 (DRAPES) ×2 IMPLANT
DRAPE U-SHAPE 47X51 STRL (DRAPES) IMPLANT
DRSG EMULSION OIL 3X3 NADH (GAUZE/BANDAGES/DRESSINGS) ×2 IMPLANT
DRSG PAD ABDOMINAL 8X10 ST (GAUZE/BANDAGES/DRESSINGS) ×2 IMPLANT
DURAPREP 26ML APPLICATOR (WOUND CARE) ×2 IMPLANT
ELECT REM PT RETURN 9FT ADLT (ELECTROSURGICAL) ×2
ELECTRODE REM PT RTRN 9FT ADLT (ELECTROSURGICAL) ×1 IMPLANT
GAUZE XEROFORM 1X8 LF (GAUZE/BANDAGES/DRESSINGS) ×4 IMPLANT
GLOVE BIOGEL PI IND STRL 8 (GLOVE) ×1 IMPLANT
GLOVE BIOGEL PI INDICATOR 8 (GLOVE) ×1
GLOVE ORTHO TXT STRL SZ7.5 (GLOVE) ×2 IMPLANT
GLOVE SURG ORTHO 8.0 STRL STRW (GLOVE) ×4 IMPLANT
GOWN STRL REIN XL XLG (GOWN DISPOSABLE) ×4 IMPLANT
KIT 1/3 TUB PL 5H 61M (Orthopedic Implant) ×1 IMPLANT
KIT BASIN OR (CUSTOM PROCEDURE TRAY) ×2 IMPLANT
KIT ROOM TURNOVER OR (KITS) ×2 IMPLANT
MANIFOLD NEPTUNE II (INSTRUMENTS) ×2 IMPLANT
NS IRRIG 1000ML POUR BTL (IV SOLUTION) ×2 IMPLANT
PACK ORTHO EXTREMITY (CUSTOM PROCEDURE TRAY) ×2 IMPLANT
PAD ARMBOARD 7.5X6 YLW CONV (MISCELLANEOUS) ×4 IMPLANT
PAD CAST 4YDX4 CTTN HI CHSV (CAST SUPPLIES) ×1 IMPLANT
PADDING CAST COTTON 4X4 STRL (CAST SUPPLIES) ×2
PROS 1/3 TUB PL 5H 61M (Orthopedic Implant) ×2 IMPLANT
SCREW CANC FT ST SFS 4X16 (Screw) ×4 IMPLANT
SCREW CORTEX 3.5 12MM (Screw) ×2 IMPLANT
SCREW CORTEX 3.5 14MM (Screw) ×1 IMPLANT
SCREW CORTEX 3.5 20MM (Screw) ×1 IMPLANT
SCREW LOCK CORT ST 3.5X12 (Screw) ×2 IMPLANT
SCREW LOCK CORT ST 3.5X14 (Screw) ×1 IMPLANT
SCREW LOCK CORT ST 3.5X20 (Screw) ×1 IMPLANT
SPLINT PLASTER CAST XFAST 5X30 (CAST SUPPLIES) ×1 IMPLANT
SPLINT PLASTER XFAST SET 5X30 (CAST SUPPLIES) ×1
SPONGE GAUZE 4X4 12PLY (GAUZE/BANDAGES/DRESSINGS) ×2 IMPLANT
SPONGE LAP 4X18 X RAY DECT (DISPOSABLE) ×2 IMPLANT
STAPLER VISISTAT 35W (STAPLE) ×2 IMPLANT
STRIP CLOSURE SKIN 1/2X4 (GAUZE/BANDAGES/DRESSINGS) ×2 IMPLANT
SUCTION FRAZIER TIP 10 FR DISP (SUCTIONS) ×2 IMPLANT
SUT VIC AB 3-0 SH 8-18 (SUTURE) ×2 IMPLANT
SYR CONTROL 10ML LL (SYRINGE) IMPLANT
TOWEL OR 17X24 6PK STRL BLUE (TOWEL DISPOSABLE) ×2 IMPLANT
TOWEL OR 17X26 10 PK STRL BLUE (TOWEL DISPOSABLE) ×2 IMPLANT
TUBE CONNECTING 12X1/4 (SUCTIONS) ×2 IMPLANT
UNDERPAD 30X30 INCONTINENT (UNDERPADS AND DIAPERS) ×2 IMPLANT
WATER STERILE IRR 1000ML POUR (IV SOLUTION) IMPLANT
YANKAUER SUCT BULB TIP NO VENT (SUCTIONS) IMPLANT

## 2011-07-12 NOTE — Progress Notes (Signed)
Orthopedic Tech Progress Note Patient Details:  [name] 1955/06/04 [id number]  Patient ID: [name], female   DOB: 05-18-1955, 56 y.o.   MRN: [id number] OHF applied.  Francisca Langenderfer T 07/12/2011, 1:08 PM

## 2011-07-12 NOTE — Anesthesia Procedure Notes (Signed)
Anesthesia Regional Block:  Popliteal block  Pre-Anesthetic Checklist: ,, timeout performed, Correct Patient, Correct Site, Correct Laterality, Correct Procedure, Correct Position, site marked, Risks and benefits discussed, Surgical consent,  Pre-op evaluation,  At surgeon's request  Laterality: Right  Prep: chloraprep       Needles:  Injection technique: Single-shot  Needle Type: Echogenic Stimulator Needle     Needle Length:cm 9 cm Needle Gauge: 22 and 22 G    Additional Needles: Popliteal block Narrative:  Start time: 07/12/2011 8:20 AM End time: 07/12/2011 8:30 AM  Performed by: Personally   Additional Notes: 30 cc 0.5% Marcaine with 1:200 Epi  Kipp Brood, MD  Popliteal block

## 2011-07-12 NOTE — Progress Notes (Signed)
Pt has had ongoing nausea. Received zofran, reglan and finally phenergan to relieve her nausea. Pt stated that she had problems taking oxycodone in the past and her episodes of nausea seem to follow taking oxy.

## 2011-07-12 NOTE — Anesthesia Preprocedure Evaluation (Addendum)
Anesthesia Evaluation  Patient identified by MRN, date of birth, ID band Patient awake    Reviewed: Allergy & Precautions, H&P , NPO status , Patient's Chart, lab work & pertinent test results  Airway Mallampati: I TM Distance: >3 FB Neck ROM: Full    Dental  (+) Upper Dentures and Dental Advisory Given   Pulmonary neg pulmonary ROS,  breath sounds clear to auscultation        Cardiovascular negative cardio ROS  Rhythm:Regular Rate:Normal     Neuro/Psych  Headaches, Seizures -,  Depression Last Seizure was Monday as a result of low Keppra level Hx brain cancer 2005  Neuromuscular disease    GI/Hepatic negative GI ROS, Neg liver ROS,   Endo/Other  Morbid obesity  Renal/GU negative Renal ROS  negative genitourinary   Musculoskeletal  (+) Fibromyalgia -  Abdominal (+) + obese,   Peds  Hematology negative hematology ROS (+)   Anesthesia Other Findings   Reproductive/Obstetrics                          Anesthesia Physical Anesthesia Plan  ASA: III  Anesthesia Plan: General   Post-op Pain Management:    Induction: Intravenous  Airway Management Planned: LMA  Additional Equipment:   Intra-op Plan:   Post-operative Plan:   Informed Consent: I have reviewed the patients History and Physical, chart, labs and discussed the procedure including the risks, benefits and alternatives for the proposed anesthesia with the patient or authorized representative who has indicated his/her understanding and acceptance.   Dental advisory given  Plan Discussed with: CRNA and Surgeon  Anesthesia Plan Comments: (Fracture R. Ankle Seizure disorder Fibromyalgia Obesity  Plan GA with popliteal block.  Kipp Brood, MD)        Anesthesia Quick Evaluation

## 2011-07-12 NOTE — Transfer of Care (Signed)
Immediate Anesthesia Transfer of Care Note  Patient: [name]  Procedure(s) Performed: Procedure(s) (LRB): OPEN REDUCTION INTERNAL FIXATION (ORIF) ANKLE FRACTURE (Right)  Patient Location: PACU  Anesthesia Type: General and Regional  Level of Consciousness: awake, alert , oriented and patient cooperative  Airway & Oxygen Therapy: Patient Spontanous Breathing and Patient connected to face mask oxygen  Post-op Assessment: Report given to PACU RN, Post -op Vital signs reviewed and stable and Patient moving all extremities X 4  Post vital signs: Reviewed and stable  Complications: No apparent anesthesia complications

## 2011-07-12 NOTE — Anesthesia Postprocedure Evaluation (Signed)
  Anesthesia Post-op Note  Patient: [name]  Procedure(s) Performed: Procedure(s) (LRB): OPEN REDUCTION INTERNAL FIXATION (ORIF) ANKLE FRACTURE (Right)  Patient Location: PACU  Anesthesia Type: General and GA combined with regional for post-op pain  Level of Consciousness: awake, alert  and oriented  Airway and Oxygen Therapy: Patient Spontanous Breathing  Post-op Pain: mild  Post-op Assessment: Post-op Vital signs reviewed and Patient's Cardiovascular Status Stable  Post-op Vital Signs: stable  Complications: No apparent anesthesia complications

## 2011-07-12 NOTE — Op Note (Signed)
07/11/2011 - 07/12/2011  PATIENT:  [name]    PRE-OPERATIVE DIAGNOSIS:  right ankle fracture, distal fibula  POST-OPERATIVE DIAGNOSIS:  Same  PROCEDURE:  OPEN REDUCTION INTERNAL FIXATION (ORIF) ANKLE FRACTURE  SURGEON:  Eulas Post, MD  PHYSICIAN ASSISTANT: Janace Litten, OPA-C, present and scrubbed throughout the case, critical for completion in a timely fashion, and for retraction, instrumentation, and closure.  ANESTHESIA:   General  PREOPERATIVE INDICATIONS:  [name] is a  56 y.o. female with a diagnosis of right ankle fracture who elected for surgical management to minimize the risk for malunion and nonunion and post-traumatic arthritis.    The risks benefits and alternatives were discussed with the patient preoperatively including but not limited to the risks of infection, bleeding, nerve injury, cardiopulmonary complications, the need for revision surgery, the need for hardware removal, among others, and the patient was willing to proceed.  OPERATIVE IMPLANTS: Synthes 1/3 tubular plate 5 hole, with a single interfragmentary lag screw.  OPERATIVE PROCEDURE: The patient was brought to the operating room and placed in the supine position. All bony prominences were padded. General anesthesia was administered. The left lower extremity was prepped and draped in the usual sterile fashion. The leg was elevated and exsanguinated and the tourniquet was inflated. Time out was performed.   Incision was made over the distal fibula and the fracture was exposed and reduced anatomically with a clamp. A lag screw was placed. I then applied a 1/3 tubular locking plate and secured it proximally and distally with locking and non-locking screws. Bone quality was mediocre. I used c-arm to confirm satisfactory reduction and fixation.   The syndesmosis was stressed using live fluoroscopy and found to be stable.  The wounds were irrigated, and closed with vicryl with routine closure for the skin.  The wounds were injected with local anesthetic. Sterile gauze was applied followed by a posterior splint. She was awakened and returned to the PACU in stable and satisfactory condition. There were no complications.

## 2011-07-12 NOTE — Progress Notes (Signed)
Subjective:  Getting ready for surgery.  Objective:   VITALS:   Filed Vitals:   07/12/11 0044 07/12/11 0100 07/12/11 0134 07/12/11 0525  BP:  129/84 147/85 131/87  Pulse: 95 92 91 86  Temp:   98.5 F (36.9 C) 97.9 F (36.6 C)  Resp:   18 18  SpO2: 97% 98% 97% 96%    Neurologically intact Dorsiflexion/Plantar flexion intact  LABS  Results for orders placed during the hospital encounter of 07/11/11 (from the past 24 hour(s))  URINALYSIS, ROUTINE W REFLEX MICROSCOPIC     Status: Abnormal   Collection Time   07/12/11  5:43 AM      Component Value Range   Color, Urine YELLOW  YELLOW   APPearance CLOUDY (*) CLEAR   Specific Gravity, Urine 1.018  1.005 - 1.030   pH 6.0  5.0 - 8.0   Glucose, UA NEGATIVE  NEGATIVE mg/dL   Hgb urine dipstick MODERATE (*) NEGATIVE   Bilirubin Urine NEGATIVE  NEGATIVE   Ketones, ur NEGATIVE  NEGATIVE mg/dL   Protein, ur NEGATIVE  NEGATIVE mg/dL   Urobilinogen, UA 0.2  0.0 - 1.0 mg/dL   Nitrite NEGATIVE  NEGATIVE   Leukocytes, UA TRACE (*) NEGATIVE  URINE MICROSCOPIC-ADD ON     Status: Normal   Collection Time   07/12/11  5:43 AM      Component Value Range   Squamous Epithelial / LPF RARE  RARE   WBC, UA 0-2  <3 WBC/hpf   RBC / HPF 11-20  <3 RBC/hpf   Bacteria, UA RARE  RARE  CBC     Status: Abnormal   Collection Time   07/12/11  7:07 AM      Component Value Range   WBC 4.4  4.0 - 10.5 K/uL   RBC 3.86 (*) 3.87 - 5.11 MIL/uL   Hemoglobin 10.8 (*) 12.0 - 15.0 g/dL   HCT 09.8 (*) 11.9 - 14.7 %   MCV 88.1  78.0 - 100.0 fL   MCH 28.0  26.0 - 34.0 pg   MCHC 31.8  30.0 - 36.0 g/dL   RDW 82.9  56.2 - 13.0 %   Platelets 206  150 - 400 K/uL    Dg Tibia/fibula Right  07/11/2011  *RADIOLOGY REPORT*  Clinical Data: Larey Seat and injured right ankle and right lower leg.  RIGHT TIBIA AND FIBULA - 2 VIEW  Comparison: Right ankle x-rays obtained concurrently.  Findings: Comminuted distal fibular fracture probable tiny avulsion fracture involving the  medial malleolus at the ankle, detailed on that report.  No fractures elsewhere involving the tibia or fibula. Visualized knee joint intact.  Note made of a bone island in the distal femur.  IMPRESSION: Distal fibular fracture probable avulsion fracture arising from the medial malleolus as detailed on the report of the ankle imaging. No fractures elsewhere involving the tibia or fibula.  Original Report Authenticated By: Arnell Sieving, M.D.   Dg Ankle Complete Right  07/11/2011  *RADIOLOGY REPORT*  Clinical Data: Larey Seat and injured right ankle.  RIGHT ANKLE - COMPLETE 3+ VIEW  Comparison: Right tibia fibula x-rays obtained concurrently.  Findings: Comminuted oblique fracture involving the distal fibula. Probable tiny avulsion fracture involving the tip of the medial malleolus.  No other fractures.  Slight widening of the medial joint space.  Ankle mortise intact. Moderate sized joint effusion/hemarthrosis.  Small plantar calcaneal spur.  IMPRESSION:  1.  Comminuted oblique fracture involving the distal fibula and probable tiny avulsion fracture  involving the medial malleolus. 2.  Slight widening of the medial joint space likely indicates injury to the medial ligament complex. 3.  Small plantar calcaneal spur.  Original Report Authenticated By: Arnell Sieving, M.D.    Assessment/Plan: Day of Surgery   Plan ORIF R ankle.  The risks benefits and alternatives were discussed with the patient including but not limited to the risks of nonoperative treatment, versus surgical intervention including infection, bleeding, nerve injury, malunion, nonunion, the need for revision surgery, hardware prominence, hardware failure, the need for hardware removal, blood clots, cardiopulmonary complications, morbidity, mortality, among others, and they were willing to proceed.     Surgery today.     Zain Lankford P 07/12/2011, 7:41 AM   Teryl Lucy, MD Cell [phone] Pager [phone]

## 2011-07-13 LAB — BASIC METABOLIC PANEL
CO2: 27 mEq/L (ref 19–32)
Calcium: 8.9 mg/dL (ref 8.4–10.5)
GFR calc non Af Amer: 81 mL/min — ABNORMAL LOW (ref >90)
Potassium: 4.2 mEq/L (ref 3.5–5.1)
Sodium: 138 mEq/L (ref 135–145)

## 2011-07-13 LAB — CBC
Hemoglobin: 10.1 g/dL — ABNORMAL LOW (ref 12.0–15.0)
MCH: 28.9 pg (ref 26.0–34.0)
MCHC: 32.9 g/dL (ref 30.0–36.0)
Platelets: 194 10*3/uL (ref 150–400)
RBC: 3.5 MIL/uL — ABNORMAL LOW (ref 3.87–5.11)

## 2011-07-13 NOTE — Progress Notes (Signed)
Patient ID: [name], female   DOB: 1955/12/16, 55 y.o.   MRN: [id number]     Subjective:  Patient reports pain as mild to moderate.  Denies numbness, CP,or SOB.  Objective:   VITALS:   Filed Vitals:   07/13/11 0400 07/13/11 0547 07/13/11 0700 07/13/11 0800  BP:  142/74    Pulse:  84    Temp:  98.7 F (37.1 C)    TempSrc:  Oral    Resp: 16 18  18   Height:   5\' 2"  (1.575 m)   Weight:   102.059 kg (225 lb)   SpO2: 99% 100%  96%    ABD soft Sensation intact distally Incision: dressing C/D/I and no drainage EHL/FHL firing  LABS  Results for orders placed during the hospital encounter of 07/11/11 (from the past 24 hour(s))  GLUCOSE, CAPILLARY     Status: Normal   Collection Time   07/12/11  9:18 PM      Component Value Range   Glucose-Capillary 88  70 - 99 mg/dL  CBC     Status: Abnormal   Collection Time   07/13/11  5:00 AM      Component Value Range   WBC 5.3  4.0 - 10.5 K/uL   RBC 3.50 (*) 3.87 - 5.11 MIL/uL   Hemoglobin 10.1 (*) 12.0 - 15.0 g/dL   HCT 84.6 (*) 96.2 - 95.2 %   MCV 87.7  78.0 - 100.0 fL   MCH 28.9  26.0 - 34.0 pg   MCHC 32.9  30.0 - 36.0 g/dL   RDW 84.1  32.4 - 40.1 %   Platelets 194  150 - 400 K/uL  BASIC METABOLIC PANEL     Status: Abnormal   Collection Time   07/13/11  5:00 AM      Component Value Range   Sodium 138  135 - 145 mEq/L   Potassium 4.2  3.5 - 5.1 mEq/L   Chloride 101  96 - 112 mEq/L   CO2 27  19 - 32 mEq/L   Glucose, Bld 136 (*) 70 - 99 mg/dL   BUN 8  6 - 23 mg/dL   Creatinine, Ser 0.27  0.50 - 1.10 mg/dL   Calcium 8.9  8.4 - 25.3 mg/dL   GFR calc non Af Amer 81 (*) >90 mL/min   GFR calc Af Amer >90  >90 mL/min    Dg Tibia/fibula Right  07/11/2011  *RADIOLOGY REPORT*  Clinical Data: Larey Seat and injured right ankle and right lower leg.  RIGHT TIBIA AND FIBULA - 2 VIEW  Comparison: Right ankle x-rays obtained concurrently.  Findings: Comminuted distal fibular fracture probable tiny avulsion fracture involving the medial malleolus  at the ankle, detailed on that report.  No fractures elsewhere involving the tibia or fibula. Visualized knee joint intact.  Note made of a bone island in the distal femur.  IMPRESSION: Distal fibular fracture probable avulsion fracture arising from the medial malleolus as detailed on the report of the ankle imaging. No fractures elsewhere involving the tibia or fibula.  Original Report Authenticated By: Arnell Sieving, M.D.   Dg Ankle 2 Views Right  07/12/2011  *RADIOLOGY REPORT*  Clinical Data: ORIF right distal fibular fracture.  PORTABLE RIGHT ANKLE - 2 VIEW 07/12/2011 1047 hours:  Comparison: Right ankle x-rays yesterday.  Findings: Examination was performed in plaster cast material.  ORIF of the comminuted distal fibular fracture with anatomic alignment. Ankle mortise intact.  IMPRESSION: Anatomic alignment post ORIF of the  distal fibular fracture.  Original Report Authenticated By: Arnell Sieving, M.D.   Dg Ankle Complete Right  07/11/2011  *RADIOLOGY REPORT*  Clinical Data: Larey Seat and injured right ankle.  RIGHT ANKLE - COMPLETE 3+ VIEW  Comparison: Right tibia fibula x-rays obtained concurrently.  Findings: Comminuted oblique fracture involving the distal fibula. Probable tiny avulsion fracture involving the tip of the medial malleolus.  No other fractures.  Slight widening of the medial joint space.  Ankle mortise intact. Moderate sized joint effusion/hemarthrosis.  Small plantar calcaneal spur.  IMPRESSION:  1.  Comminuted oblique fracture involving the distal fibula and probable tiny avulsion fracture involving the medial malleolus. 2.  Slight widening of the medial joint space likely indicates injury to the medial ligament complex. 3.  Small plantar calcaneal spur.  Original Report Authenticated By: Arnell Sieving, M.D.    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 [facility] requested.  SW consult.   Torrie Mayers, BRANDON 07/13/2011, 9:00 AM   Teryl Lucy, MD Cell [phone] Pager [phone]

## 2011-07-13 NOTE — Evaluation (Signed)
Physical Therapy Evaluation Patient Details Name: [name] MRN: [id number] DOB: April 03, 1955 Today's Date: 07/13/2011 Time: 4098-1191 PT Time Calculation (min): 30 min  PT Assessment / Plan / Recommendation Clinical Impression  Patient is a 56 yo female s/p ORIF right ankle fx - h/o multiple falls.  Patient with fibromyalgia and general weakness.  Great difficulty with attempts at transfers/gait today.  Recommend Inpatient Rehab consult to maximize independence prior to return home.  Will follow acutely for functional mobility and gait training, and education.    PT Assessment  Patient needs continued PT services    Follow Up Recommendations  Inpatient Rehab    Barriers to Discharge        Equipment Recommendations  Defer to next venue    Recommendations for Other Services Rehab consult   Frequency Min 6X/week    Precautions / Restrictions Precautions Precautions: Fall Precaution Comments: Patient fearful when attempting to take steps Restrictions Weight Bearing Restrictions: Yes RLE Weight Bearing: Non weight bearing   Pertinent Vitals/Pain Pain limiting session today      Mobility  Bed Mobility Bed Mobility: Supine to Sit;Sitting - Scoot to Edge of Bed Supine to Sit: 1: +2 Total assist;With rails;HOB elevated Supine to Sit: Patient Percentage: 50% Sitting - Scoot to Edge of Bed: 1: +1 Total assist Details for Bed Mobility Assistance: Verbal and tactile cues for technique.  Used bed pad to assist patient to sitting EOB. Transfers Transfers: Sit to Stand;Stand to Sit;Stand Pivot Transfers Sit to Stand: 1: +2 Total assist;With upper extremity assist;From bed Sit to Stand: Patient Percentage: 60% Stand to Sit: 1: +2 Total assist;With upper extremity assist;To bed;To chair/3-in-1;With armrests Stand to Sit: Patient Percentage: 70% Stand Pivot Transfers: 1: +2 Total assist Stand Pivot Transfers: Patient Percentage: 20% Details for Transfer Assistance: Verbal and tactile  cuing for safe hand placement - needed repeated cues.  Verbal cues to maintain NWB RLE.  Patient able to stand x5 for < 30 seconds each time.  Attempt at stand-pivot transfer unsuccessful x3.  Patient unable to off-load LLE using UE's to be able to pivot foot for transfer.  (Moved bed and placed chair behind patient)  Patient reports being fearful during attempts at transfer.  Recommended nursing use lift equipment to move patient back to bed. Ambulation/Gait Ambulation/Gait Assistance: Not tested (comment) (Unable)    Exercises     PT Diagnosis: Difficulty walking;Generalized weakness;Acute pain  PT Problem List: Decreased strength;Decreased range of motion;Decreased activity tolerance;Decreased mobility;Decreased knowledge of use of DME;Decreased knowledge of precautions;Pain PT Treatment Interventions: DME instruction;Gait training;Functional mobility training;Therapeutic exercise;Therapeutic activities;Patient/family education   PT Goals Acute Rehab PT Goals PT Goal Formulation: With patient/family Time For Goal Achievement: 07/20/11 Potential to Achieve Goals: Good Pt will Roll Supine to Right Side: Independently PT Goal: Rolling Supine to Right Side - Progress: Goal set today Pt will go Supine/Side to Sit: with min assist;with rail;with HOB 0 degrees PT Goal: Supine/Side to Sit - Progress: Goal set today Pt will go Sit to Supine/Side: with min assist;with HOB 0 degrees;with rail PT Goal: Sit to Supine/Side - Progress: Goal set today Pt will go Sit to Stand: with min assist;with upper extremity assist PT Goal: Sit to Stand - Progress: Goal set today Pt will go Stand to Sit: with min assist;with upper extremity assist PT Goal: Stand to Sit - Progress: Goal set today Pt will Transfer Bed to Chair/Chair to Bed: with min assist PT Transfer Goal: Bed to Chair/Chair to Bed - Progress: Goal set  today Pt will Ambulate: 16 - 50 feet;with min assist;with rolling walker PT Goal: Ambulate -  Progress: Goal set today  Visit Information  Last PT Received On: 07/13/11 Assistance Needed: +2    Subjective Data  Subjective: "Why am I here?"  "I'm so weak" Patient Stated Goal: To walk   Prior Functioning  Home Living Lives With: Spouse Available Help at Discharge: Family;Available 24 hours/day Type of Home: House Home Access: Stairs to enter [employer] of Steps: 2 Entrance Stairs-Rails: None Home Layout: One level Bathroom Shower/Tub: Engineer, manufacturing systems: Standard Bathroom Accessibility: Yes How Accessible: Accessible via walker Home Adaptive Equipment: Walker - rolling Prior Function Level of Independence: Independent Able to Take Stairs?: Yes Driving: Yes Communication Communication: No difficulties Dominant Hand: Right    Cognition  Overall Cognitive Status: Appears within functional limits for tasks assessed/performed Arousal/Alertness: Awake/alert Orientation Level: Disoriented to;Time Behavior During Session: Flat affect (h/o frontal lobe CA) Cognition - Other Comments: Patient anxious and fearful when attempting to stand/step.    Extremity/Trunk Assessment Right Upper Extremity Assessment RUE ROM/Strength/Tone: Deficits RUE ROM/Strength/Tone Deficits: Shoulder ROM limited; Strength 4-/5 Left Upper Extremity Assessment LUE ROM/Strength/Tone: Deficits LUE ROM/Strength/Tone Deficits: Shoulder ROM limited; Strength grossly 4/5 Right Lower Extremity Assessment RLE ROM/Strength/Tone: Unable to fully assess;Due to pain;Deficits RLE ROM/Strength/Tone Deficits: Able to move LE against gravity; ankle NT Left Lower Extremity Assessment LLE ROM/Strength/Tone: Deficits LLE ROM/Strength/Tone Deficits: General weakness 4/5   Balance    End of Session PT - End of Session Equipment Utilized During Treatment: Gait belt Activity Tolerance: Patient limited by fatigue;Patient limited by pain Patient left: in chair;with call bell/phone within  reach;with family/visitor present Nurse Communication: Mobility status;Need for lift equipment  GP     Vena Austria 07/13/2011, 11:15 AM Durenda Hurt. Renaldo Fiddler, [facility] Acute Rehab Services Pager [phone]

## 2011-07-13 NOTE — Progress Notes (Signed)
CSW consulted for SNF.  Consult for inpatient rehab noted. Awaiting PT/OT eval to determine appropriate level of care. Will follow and pursue SNF if needed. Dellie Burns, MSW, [place] [phone] (weekend)

## 2011-07-14 ENCOUNTER — Encounter (HOSPITAL_COMMUNITY): Payer: Self-pay | Admitting: Orthopedic Surgery

## 2011-07-14 MED ORDER — HYDROCODONE-ACETAMINOPHEN 10-325 MG PO TABS: 1.0000 | ORAL_TABLET | Freq: Four times a day (QID) | ORAL | Status: AC | PRN

## 2011-07-14 NOTE — Progress Notes (Signed)
Occupational Therapy Evaluation Patient Details Name: [name] MRN: [id number] DOB: 05-17-1955 Today's Date: 07/14/2011 Time: 4098-1191 OT Time Calculation (min): 26 min  OT Assessment / Plan / Recommendation Clinical Impression  Pt s/p ORIF right ankle fx - h/o multiple falls and fibromyalgia.  Will benefit from acute OT services to address below problem list in prep for d/c to [facility] for further rehab before return home.    OT Assessment  Patient needs continued OT Services    Follow Up Recommendations  Skilled nursing facility (pt requesting [facility])    Barriers to Discharge      Equipment Recommendations  Defer to next venue    Recommendations for Other Services    Frequency  Min 2X/week    Precautions / Restrictions Precautions Precautions: Fall Restrictions Weight Bearing Restrictions: Yes RLE Weight Bearing: Non weight bearing   Pertinent Vitals/Pain See vitals    ADL  Upper Body Dressing: Performed;Set up Where Assessed - Upper Body Dressing: Unsupported sitting Lower Body Dressing: Performed;+1 Total assistance Where Assessed - Lower Body Dressing: Supported sit to stand Toilet Transfer: Simulated;+2 Total assistance Toilet Transfer: Patient Percentage: 30% Statistician Method: Surveyor, minerals:  (EOB to chair) Toileting - Clothing Manipulation and Hygiene: Performed;+1 Total assistance Where Assessed - Engineer, mining and Hygiene: Standing Equipment Used: Gait belt;Rolling walker Transfers/Ambulation Related to ADLs: +2 assist for stand pivot transfer due to pt unable to offload weight through bil. UE with RW and difficulty maintaining WB status. ADL Comments: Pt with urinary incontinence in bed.  Required +1 total assist to perform hygiene while standing.     OT Diagnosis: Generalized weakness;Cognitive deficits  OT Problem List: Decreased strength;Decreased activity tolerance;Impaired balance (sitting and/or  standing);Decreased knowledge of use of DME or AE;Decreased knowledge of precautions OT Treatment Interventions: Self-care/ADL training;Therapeutic exercise;DME and/or AE instruction;Therapeutic activities;Patient/family education;Balance training;Cognitive remediation/compensation   OT Goals Acute Rehab OT Goals OT Goal Formulation: With patient Time For Goal Achievement: 07/21/11 Potential to Achieve Goals: Good ADL Goals Pt Will Transfer to Toilet: with min assist;Stand pivot transfer;with DME;3-in-1;Maintaining weight bearing status ADL Goal: Toilet Transfer - Progress: Goal set today Pt Will Perform Toileting - Clothing Manipulation: with min assist;Standing ADL Goal: Toileting - Clothing Manipulation - Progress: Goal set today Pt Will Perform Toileting - Hygiene: with min assist;Standing at 3-in-1/toilet ADL Goal: Toileting - Hygiene - Progress: Goal set today Miscellaneous OT Goals Miscellaneous OT Goal #1: Pt will perform bed mobility with supervision as precursor for EOB ADLs. OT Goal: Miscellaneous Goal #1 - Progress: Goal set today Miscellaneous OT Goal #2: Pt will independently perform bil UE strengthening HEP in prep for functional sit<>stand transfer. OT Goal: Miscellaneous Goal #2 - Progress: Goal set today  Visit Information  Last OT Received On: 07/14/11 Assistance Needed: +2    Subjective Data  Subjective: What are we trying to do here?   Prior Functioning  Home Living Lives With: Spouse Available Help at Discharge: Family;Available 24 hours/day Type of Home: House Home Access: Stairs to enter [employer] of Steps: 2 Entrance Stairs-Rails: None Home Layout: One level Bathroom Shower/Tub: Engineer, manufacturing systems: Standard Bathroom Accessibility: Yes How Accessible: Accessible via walker Home Adaptive Equipment: Walker - rolling Prior Function Level of Independence: Independent Able to Take Stairs?: Yes Driving:  Yes Communication Communication: No difficulties Dominant Hand: Right    Cognition  Overall Cognitive Status: Impaired Area of Impairment: Problem solving Orientation Level: Disoriented to;Place;Time Behavior During Session: [facility] for tasks performed  Problem Solving: Slow to process cues.  Required step-by-step instructions for tasks.   Cognition - Other Comments: Unable to state she was in hospital to family member on phone- "I'm at that therapy place".      Extremity/Trunk Assessment Right Upper Extremity Assessment RUE ROM/Strength/Tone: Deficits RUE ROM/Strength/Tone Deficits: Shoulder ROM limited; Strength 4-/5 Left Upper Extremity Assessment LUE ROM/Strength/Tone: Deficits LUE ROM/Strength/Tone Deficits: Shoulder ROM limited; Strength grossly 4/5   Mobility Bed Mobility Bed Mobility: Supine to Sit;Sitting - Scoot to Edge of Bed Supine to Sit: 3: Mod assist;With rails;HOB elevated (HOB elevated ~40 degrees) Sitting - Scoot to Edge of Bed: 3: Mod assist;With rail Details for Bed Mobility Assistance: Max directional cues/instructions for technique.  Pt with difficulty problem solving & required repetitive cueing.  Use of draw pad to pivot hips & bring hips closer to EOB.   Transfers Sit to Stand: 1: +2 Total assist;From bed;With upper extremity assist Sit to Stand: Patient Percentage: 60% Stand to Sit: 1: +2 Total assist;With armrests;To chair/3-in-1 Stand to Sit: Patient Percentage: 40% Details for Transfer Assistance: Max directional cues for technique.  Pt still unable to off load weight from LLE.  Total assist for RW management during pivot, & assist to rotate hips from bed>recliner.     Exercise    Balance Balance Balance Assessed: No  End of Session OT - End of Session Equipment Utilized During Treatment: Gait belt Activity Tolerance: Patient tolerated treatment well Patient left: in chair;with call bell/phone within reach Nurse Communication: Mobility status  GO     07/14/2011 Cipriano Mile OTR/L Pager [phone] Office [phone]  Cipriano Mile 07/14/2011, 11:08 AM

## 2011-07-14 NOTE — Consult Note (Signed)
Physical Medicine and Rehabilitation Consult Reason for Consult: Right ankle fracture Referring Physician: Dr. Dion Saucier   HPI: [name] is a 56 y.o. right-handed female with history of fibromyalgia as well as seizure disorder who was admitted 07/11/2011 after a mechanical fall at home when she lost her balance. She denied loss of consciousness. X-rays revealed a right ankle fracture, distal fibula. Underwent open reduction internal fixation 07/12/2011 per Dr. Dion Saucier. Advised nonweightbearing to right lower extremity. Placed on subcutaneous Lovenox for DVT prophylaxis. Postoperative pain management. She remains on Keppra for history of seizure disorder. Physical therapy evaluation completed 07/13/2011 with recommendations for physical medicine rehabilitation consult to consider inpatient rehabilitation services  Review of Systems  Gastrointestinal: Positive for constipation.  Musculoskeletal: Positive for joint pain.  Neurological: Positive for seizures and headaches.  All other systems reviewed and are negative.   Past Medical History  Diagnosis Date  . Brain cancer     Frontal lobe, 1993 and 2005  . Migraines   . Morton neuroma   . Seizures   . Migraine   . Fibromyalgia   . Right fibular fracture 07/12/2011  . 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 Appendectomy 1976  . Excision morton's neuroma     right foot  . Brain cancer     1993 and 2005   Family History  Problem Relation Age of Onset  . Diabetes Mother   . Diabetes Brother   . Hypertension Mother   . Hypertension Father    Social History:  reports that she has never smoked. She has never used smokeless tobacco. She reports that she does not drink alcohol or use illicit drugs. Allergies:  Allergies  Allergen Reactions  . Ciprofloxacin     REACTION: NAUSEA AND VOMITING  . Cyclobenzaprine Hcl   .  Methocarbamol     REACTION: itching  . Metronidazole   . Penicillins     REACTION: RASH  . Versed (Midazolam) Other (See Comments)    Severe respiratory depression, had to be masked in preop holding.   Medications Prior to Admission  Medication Sig Dispense Refill  . butalbital-aspirin-caffeine (FIORINAL) 50-325-40 MG per capsule Take 1 capsule by mouth every 6 (six) hours as needed.       . Calcium Citrate-Vitamin D (CITRACAL + D PO) Take by mouth 2 (two) times daily.        Marland Kitchen co-enzyme Q-10 30 MG capsule Take 50 mg by mouth every Monday, Wednesday, and Friday.       . DULoxetine (CYMBALTA) 60 MG capsule Take 1 capsule (60 mg total) by mouth daily.  30 capsule  11  . fenofibrate 160 MG tablet Take 1 tablet (160 mg total) by mouth daily.  30 tablet  3  . fish oil-omega-3 fatty acids 1000 MG capsule Take 1 g by mouth 2 (two) times daily.        Marland Kitchen levETIRAcetam (KEPPRA) 500 MG tablet Take 1,500 mg by mouth 2 (two) times daily.       . pregabalin (LYRICA) 100 MG capsule Take 1 capsule (100 mg total) by mouth 2 (two) times daily.  60 capsule  3  . promethazine (PHENERGAN) 25 MG tablet Take 1 tablet (25 mg total) by mouth every 8 (eight) hours as needed for nausea.  30 tablet  0  . rosuvastatin (CRESTOR) 10 MG tablet Take 10  mg by mouth every Monday, Wednesday, and Friday.       . traMADol (ULTRAM) 50 MG tablet Take 1 tablet (50 mg total) by mouth every 6 (six) hours as needed for pain.  60 tablet  1  . traZODone (DESYREL) 50 MG tablet Take 0.5-1 tablets (25-50 mg total) by mouth at bedtime as needed for sleep.  30 tablet  3  . DISCONTD: meloxicam (MOBIC) 15 MG tablet Take 15 mg by mouth daily as needed. For pain.      . mometasone (NASONEX) 50 MCG/ACT nasal spray Place 2 sprays into the nose daily as needed. For nasal congestion.        Home: Home Living Lives With: Spouse Available Help at Discharge: Family;Available 24 hours/day Type of Home: House Home Access: Stairs to enter [employer] of Steps: 2 Entrance Stairs-Rails: None Home Layout: One level Bathroom Shower/Tub: Engineer, manufacturing systems: Standard Bathroom Accessibility: Yes How Accessible: Accessible via walker Home Adaptive Equipment: Walker - rolling  Functional History: Prior Function Able to Take Stairs?: Yes Driving: Yes Functional Status:  Mobility: Bed Mobility Bed Mobility: Supine to Sit;Sitting - Scoot to Edge of Bed Supine to Sit: 1: +2 Total assist;With rails;HOB elevated Supine to Sit: Patient Percentage: 50% Sitting - Scoot to Edge of Bed: 1: +1 Total assist Transfers Transfers: Sit to Stand;Stand to Sit;Stand Pivot Transfers Sit to Stand: 1: +2 Total assist;With upper extremity assist;From bed Sit to Stand: Patient Percentage: 60% Stand to Sit: 1: +2 Total assist;With upper extremity assist;To bed;To chair/3-in-1;With armrests Stand to Sit: Patient Percentage: 70% Stand Pivot Transfers: 1: +2 Total assist Stand Pivot Transfers: Patient Percentage: 20% Ambulation/Gait Ambulation/Gait Assistance: Not tested (comment) (Unable)    ADL:    Cognition: Cognition Arousal/Alertness: Awake/alert Orientation Level: Oriented X4 Cognition Overall Cognitive Status: Appears within functional limits for tasks assessed/performed Arousal/Alertness: Awake/alert Orientation Level: Disoriented to;Time Behavior During Session: Flat affect (h/o frontal lobe CA) Cognition - Other Comments: Patient anxious and fearful when attempting to stand/step.  Blood pressure 149/88, pulse 84, temperature 98.7 F (37.1 C), temperature source Oral, resp. rate 18, height 5\' 2"  (1.575 m), weight 102.059 kg (225 lb), SpO2 97.00%. Physical Exam  Vitals reviewed. Constitutional: She is oriented to person, place, and time. She appears well-developed.  HENT:  Head: Normocephalic.  Neck: Neck supple. No thyromegaly present.  Cardiovascular: Normal rate and regular rhythm.   Pulmonary/Chest: Breath  sounds normal. No respiratory distress. She has no wheezes.  Abdominal: Bowel sounds are normal. She exhibits no distension. There is no tenderness.  Neurological: She is alert and oriented to person, place, and time.  Skin:       Right lower extremity in short leg soft cast  Psychiatric: She has a normal mood and affect.    No results found for this or any previous visit (from the past 24 hour(s)). Dg Ankle 2 Views Right  07/12/2011  *RADIOLOGY REPORT*  Clinical Data: ORIF right distal fibular fracture.  PORTABLE RIGHT ANKLE - 2 VIEW 07/12/2011 1047 hours:  Comparison: Right ankle x-rays yesterday.  Findings: Examination was performed in plaster cast material.  ORIF of the comminuted distal fibular fracture with anatomic alignment. Ankle mortise intact.  IMPRESSION: Anatomic alignment post ORIF of the distal fibular fracture.  Original Report Authenticated By: Arnell Sieving, M.D.    Assessment/Plan: Diagnosis:  R lateral malleolous fx. RN tells me SNF is planned for today. I will defer a formal consult.  Ivory Broad, MD

## 2011-07-14 NOTE — Progress Notes (Signed)
confused

## 2011-07-14 NOTE — Progress Notes (Signed)
Patient ID: MERCIA DOWE, female   DOB: 05-09-1955, 56 y.o.   MRN: [id number]     Subjective:  Patient reports pain as mild to moderate.  Patient is doing better and denies CP or SOB.   Objective:   VITALS:   Filed Vitals:   07/13/11 1239 07/13/11 1600 07/13/11 2105 07/14/11 0611  BP: 142/88  149/88 128/84  Pulse: 91  84 84  Temp: 99.3 F (37.4 C)  98.7 F (37.1 C) 98.7 F (37.1 C)  TempSrc: Oral  Oral Oral  Resp: 18 16 18 20   Height:      Weight:      SpO2: 93% 93% 97% 97%    ABD soft Sensation intact distally Incision: dressing C/D/I and no drainage EHL/FHL firing  LABS  No results found for this or any previous visit (from the past 24 hour(s)).  Dg Ankle 2 Views Right  07/12/2011  *RADIOLOGY REPORT*  Clinical Data: ORIF right distal fibular fracture.  PORTABLE RIGHT ANKLE - 2 VIEW 07/12/2011 1047 hours:  Comparison: Right ankle x-rays yesterday.  Findings: Examination was performed in plaster cast material.  ORIF of the comminuted distal fibular fracture with anatomic alignment. Ankle mortise intact.  IMPRESSION: Anatomic alignment post ORIF of the distal fibular fracture.  Original Report Authenticated By: Arnell Sieving, M.D.    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

## 2011-07-14 NOTE — Progress Notes (Signed)
Wasted Oxycodone 10mg  to be given by Nursing student because of refusal from patient. Documented medication not given and wasted under student in [facility]. Wasted in pyxis by J.Willa Rough, RN with witness. Ultram 50mg  given for pain per pt request.

## 2011-07-14 NOTE — Progress Notes (Signed)
56 yr old female s/p right ankle fracture. Patient is for possible SNF for shortterm rehab. Social worker to see.

## 2011-07-14 NOTE — Discharge Summary (Signed)
Physician Discharge Summary  Patient ID: [name] MRN: [id number] DOB/AGE: December 13, 1955 56 y.o.  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 Brain cancer     Frontal lobe, 1993 and 2005  . Migraines   . Morton neuroma   . Seizures   . Migraine   . Fibromyalgia   . Right fibular fracture 07/12/2011  . 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 female who was admitted 07/11/2011 with a diagnosis of Right fibular fracture and went to the operating room on 07/11/2011 - 07/12/2011 and underwent the above named procedures.    She was given perioperative antibiotics:  Anti-infectives     Start     Dose/Rate Route Frequency Ordered Stop   07/12/11 1430   ceFAZolin (ANCEF) IVPB 2 g/50 mL premix        2 g 100 mL/hr over 30 Minutes Intravenous Every 6 hours 07/12/11 1042 07/12/11 2033   07/12/11 0800   vancomycin (VANCOCIN) IVPB 1000 mg/200 mL premix  Status:  Discontinued        1,000 mg 200 mL/hr over 60 Minutes Intravenous 60 min pre-op 07/12/11 0146 07/12/11 1205   07/12/11 0800   ceFAZolin (ANCEF) IVPB 2 g/50 mL premix        2 g 100 mL/hr over 30 Minutes Intravenous 60 min pre-op 07/12/11 0158 07/12/11 0849        .  She was given sequential compression devices, early ambulation, and lovenox for DVT prophylaxis.  She benefited maximally from the hospital stay and there were no complications.    Recent vital signs:  Filed Vitals:   07/14/11 1549  BP:   Pulse:   Temp:     Resp: 20    Recent laboratory studies:  Lab Results  Component Value Date   HGB 10.1* 07/13/2011   HGB 10.8* 07/12/2011   HGB 11.4* 12/04/2010   Lab Results  Component Value Date   WBC 5.3 07/13/2011   PLT 194 07/13/2011   Lab Results  Component Value Date   INR 1.12 07/12/2011   Lab Results  Component Value Date   NA 138 07/13/2011   K 4.2 07/13/2011   CL 101 07/13/2011   CO2 27 07/13/2011   BUN 8 07/13/2011   CREATININE 0.81 07/13/2011   GLUCOSE 136* 07/13/2011    Discharge Medications:   Medication List  As of 07/14/2011  4:09 PM   STOP taking these medications         meloxicam 15 MG tablet         TAKE these medications         butalbital-aspirin-caffeine 50-325-40 MG per capsule   Commonly known as: FIORINAL   Take 1 capsule by mouth every 6 (six) hours as needed.      CITRACAL + D PO   Take by mouth 2 (two) times daily.      co-enzyme Q-10 30 MG capsule   Take 50 mg by mouth every Monday, Wednesday, and Friday.  DULoxetine 60 MG capsule   Commonly known as: CYMBALTA   Take 1 capsule (60 mg total) by mouth daily.      enoxaparin 40 MG/0.4ML injection   Commonly known as: LOVENOX   Inject 0.4 mLs (40 mg total) into the skin daily.      fenofibrate 160 MG tablet   Take 1 tablet (160 mg total) by mouth daily.      fish oil-omega-3 fatty acids 1000 MG capsule   Take 1 g by mouth 2 (two) times daily.      HYDROcodone-acetaminophen 10-325 MG per tablet   Commonly known as: NORCO   Take 1-2 tablets by mouth every 6 (six) hours as needed for pain.      KEPPRA 500 MG tablet   Generic drug: levETIRAcetam   Take 1,500 mg by mouth 2 (two) times daily.      mometasone 50 MCG/ACT nasal spray   Commonly known as: NASONEX   Place 2 sprays into the nose daily as needed. For nasal congestion.      pregabalin 100 MG capsule   Commonly known as: LYRICA   Take 1 capsule (100 mg total) by mouth 2 (two) times daily.      promethazine 25 MG tablet   Commonly known as:  PHENERGAN   Take 1 tablet (25 mg total) by mouth every 8 (eight) hours as needed for nausea.      rosuvastatin 10 MG tablet   Commonly known as: CRESTOR   Take 10 mg by mouth every Monday, Wednesday, and Friday.      traMADol 50 MG tablet   Commonly known as: ULTRAM   Take 1 tablet (50 mg total) by mouth every 6 (six) hours as needed for pain.      traZODone 50 MG tablet   Commonly known as: DESYREL   Take 0.5-1 tablets (25-50 mg total) by mouth at bedtime as needed for sleep.            Diagnostic Studies: Dg Tibia/fibula Right  07/11/2011  *RADIOLOGY REPORT*  Clinical Data: Larey Seat and injured right ankle and right lower leg.  RIGHT TIBIA AND FIBULA - 2 VIEW  Comparison: Right ankle x-rays obtained concurrently.  Findings: Comminuted distal fibular fracture probable tiny avulsion fracture involving the medial malleolus at the ankle, detailed on that report.  No fractures elsewhere involving the tibia or fibula. Visualized knee joint intact.  Note made of a bone island in the distal femur.  IMPRESSION: Distal fibular fracture probable avulsion fracture arising from the medial malleolus as detailed on the report of the ankle imaging. No fractures elsewhere involving the tibia or fibula.  Original Report Authenticated By: Arnell Sieving, M.D.   Dg Ankle 2 Views Right  07/12/2011  *RADIOLOGY REPORT*  Clinical Data: ORIF right distal fibular fracture.  PORTABLE RIGHT ANKLE - 2 VIEW 07/12/2011 1047 hours:  Comparison: Right ankle x-rays yesterday.  Findings: Examination was performed in plaster cast material.  ORIF of the comminuted distal fibular fracture with anatomic alignment. Ankle mortise intact.  IMPRESSION: Anatomic alignment post ORIF of the distal fibular fracture.  Original Report Authenticated By: Arnell Sieving, M.D.   Dg Ankle Complete Right  07/11/2011  *RADIOLOGY REPORT*  Clinical Data: Larey Seat and injured right ankle.  RIGHT ANKLE - COMPLETE 3+ VIEW  Comparison: Right tibia  fibula x-rays obtained concurrently.  Findings: Comminuted oblique fracture involving the distal fibula. Probable tiny avulsion fracture involving the tip of the medial malleolus.  No other  fractures.  Slight widening of the medial joint space.  Ankle mortise intact. Moderate sized joint effusion/hemarthrosis.  Small plantar calcaneal spur.  IMPRESSION:  1.  Comminuted oblique fracture involving the distal fibula and probable tiny avulsion fracture involving the medial malleolus. 2.  Slight widening of the medial joint space likely indicates injury to the medial ligament complex. 3.  Small plantar calcaneal spur.  Original Report Authenticated By: Arnell Sieving, M.D.    Disposition:   Discharge Orders    Future Appointments: Provider: Department: Dept Phone: Center:   08/20/2011 11:00 AM Sheliah Hatch, MD [facility] [phone] [facility]     Future Orders Please Complete By Expires   Diet general      Call MD / Call 911      Comments:   If you experience chest pain or shortness of breath, CALL 911 and be transported to the hospital emergency room.  If you develope a fever above 101 F, pus (white drainage) or increased drainage or redness at the wound, or calf pain, call your surgeon's office.   Discharge instructions      Comments:   Change dressing in 3 days and reapply fresh dressing, unless you have a splint (half cast).  If you have a splint/cast, just leave in place until your follow-up appointment.    Keep wounds dry for 3 weeks.  Leave steri-strips in place on skin.  Do not apply lotion or anything to the wound.   Constipation Prevention      Comments:   Drink plenty of fluids.  Prune juice may be helpful.  You may use a stool softener, such as Colace (over the counter) 100 mg twice a day.  Use MiraLax (over the counter) for constipation as needed.   Non weight bearing      Scheduling Instructions:   Right lower extremity      Follow-up Information    Follow up with  Eulas Post, MD.   Contact information:   [facility] [address] [place] [address] [phone]           Signed: Eulas Post 07/14/2011, 4:09 PM

## 2011-07-14 NOTE — Progress Notes (Addendum)
Physical Therapy Treatment Patient Details Name: [name] MRN: [id number] DOB: 1955-06-21 Today's Date: 07/14/2011 Time: 4098-1191 PT Time Calculation (min): 22 min  PT Assessment / Plan / Recommendation Comments on Treatment Session  Pt unable to off load weight from LLE using UE's at this date.  Cont's to require +2 total assist for transfers.  Per chart review,  plans are for pt to d/c to SNF rather than CIR when appropriate.        Follow Up Recommendations  Skilled nursing facility    Barriers to Discharge        Equipment Recommendations  Defer to next venue    Recommendations for Other Services    Frequency Min 6X/week   Plan      Precautions / Restrictions Precautions Precautions: Fall Restrictions Weight Bearing Restrictions: Yes RLE Weight Bearing: Non weight bearing     Pertinent Vitals/Pain 6/10.  Premedicated.      Mobility  Bed Mobility Bed Mobility: Supine to Sit;Sitting - Scoot to Edge of Bed Supine to Sit: 3: Mod assist;With rails;HOB elevated (HOB elevated ~40 degrees) Sitting - Scoot to Edge of Bed: 3: Mod assist;With rail Details for Bed Mobility Assistance: Max directional cues/instructions for technique.  Pt with difficulty problem solving & required repetitive cueing.  Use of draw pad to pivot hips & bring hips closer to EOB.   Transfers Transfers: Sit to Stand;Stand to Sit;Stand Pivot Transfers Sit to Stand: 1: +2 Total assist;From bed;With upper extremity assist Sit to Stand: Patient Percentage: 60% Stand to Sit: 1: +2 Total assist;With armrests;To chair/3-in-1 Stand to Sit: Patient Percentage: 40% Stand Pivot Transfers: 1: +2 Total assist Stand Pivot Transfers: Patient Percentage: 30% Details for Transfer Assistance: Max directional cues for technique.  Pt still unable to off load weight from LLE.  Total assist for RW management during pivot, & assist to rotate hips from bed>recliner.   Ambulation/Gait Ambulation/Gait Assistance: Not tested  (comment) Wheelchair Mobility Wheelchair Mobility: No    Exercises      PT Goals Acute Rehab PT Goals Potential to Achieve Goals: Good PT Goal: Supine/Side to Sit - Progress: Progressing toward goal PT Goal: Sit to Stand - Progress: Progressing toward goal PT Goal: Stand to Sit - Progress: Progressing toward goal PT Transfer Goal: Bed to Chair/Chair to Bed - Progress: Progressing toward goal  Visit Information  Last PT Received On: 07/14/11 Assistance Needed: +2    Subjective Data  Subjective: "What are we trying to do?"   Cognition  Overall Cognitive Status: Impaired Area of Impairment: Problem solving Orientation Level: Disoriented to;Place;Time Behavior During Session: [facility] for tasks performed Problem Solving: Slow to process cues.  Required step-by-step instructions for tasks.   Cognition - Other Comments: Unable to state she was in hospital to family member on phone- "I'm at that therapy place".      Balance  Balance Balance Assessed: No  End of Session PT - End of Session Equipment Utilized During Treatment: Gait belt Patient left: in chair;with call bell/phone within reach    [place], [place] [phone] 07/14/2011

## 2011-07-15 NOTE — Clinical Social Work Psychosocial (Signed)
     Clinical Social Work Department BRIEF PSYCHOSOCIAL ASSESSMENT 07/15/2011  Patient:  [name], [name]     Account Number:  [id number]     Admit date:  07/11/2011  Clinical Social Worker:  Burnard Hawthorne  Date/Time:  07/14/2011 04:30 PM  Referred by:  Physician  Date Referred:  07/14/2011 Referred for  SNF Placement   Other Referral:   Interview type:  Patient Other interview type:    PSYCHOSOCIAL DATA Living Status:  HUSBAND Admitted from facility:   Level of care:   Primary support name:  [name] Primary support relationship to patient:  SPOUSE Degree of support available:   Very invovled    CURRENT CONCERNS Current Concerns  Post-Acute Placement   Other Concerns:    SOCIAL WORK ASSESSMENT / PLAN CSW referred to assist patient with short term SNF. Patient is alert, oriented and very pleasant. She lives at home with her husband and plans to return hom at d/c. She requests placement at [facility] and has spoken with facility prior to admit to hospital.  Will forward referral information to SNF. Spoke to [place] at [facility]- she is aware of patient and awaiting referral   Assessment/plan status:  Psychosocial Support/Ongoing Assessment of Needs Other assessment/ plan:   Information/referral to community resources:   Discussed after care issues for possible need of HH/DME  SNF list offered but declined- patient has pre-chose [facility]    PATIENTS/FAMILYS RESPONSE TO PLAN OF CARE: Patient is alert, oriented and pleasant. She is requesting short term SNF. [place] relates that her husband supports DC decision.

## 2011-07-15 NOTE — Progress Notes (Signed)
Utilization review completed. Reyes Aldaco, RN, BSN. 

## 2011-07-15 NOTE — Progress Notes (Signed)
Physical Therapy Treatment Patient Details Name: RAEDEN BELZER MRN: [id number] DOB: [birth year] Today's Date: 07/15/2011 Time: 9562-1308 PT Time Calculation (min): 17 min  PT Assessment / Plan / Recommendation Comments on Treatment Session  Plans are for pt to d/c to [facility] today.  Pt progressing slowly with mobility at this date.      Follow Up Recommendations  Skilled nursing facility    Barriers to Discharge        Equipment Recommendations  Defer to next venue    Recommendations for Other Services    Frequency Min 6X/week   Plan Discharge plan remains appropriate    Precautions / Restrictions Precautions Precautions: Fall Restrictions Weight Bearing Restrictions: Yes RLE Weight Bearing: Non weight bearing    Pertinent Vitals/Pain Only reports headache, no LE/ankle pain today.  Has had pain medication for headache    Mobility  Bed Mobility Bed Mobility: Supine to Sit;Sitting - Scoot to Edge of Bed Supine to Sit: 4: Min assist;HOB flat;With rails Sitting - Scoot to Edge of Bed: 4: Min assist;With rail Details for Bed Mobility Assistance: VC's for sequencing & technique.  Repeated cues for scooting hips closer to EOB.   Use of draw pad to pivot hips to square hips up on EOB & (A) to lift shoulders/trunk to sitting upright.  Transfers Transfers: Sit to Stand;Stand to Sit;Stand Pivot Transfers Sit to Stand: 1: +2 Total assist;With upper extremity assist;From bed;From chair/3-in-1;With armrests Sit to Stand: Patient Percentage: 60% Stand to Sit: 2: Max assist;With upper extremity assist;With armrests;To chair/3-in-1 Stand to Sit: Patient Percentage: 50% Stand Pivot Transfers: 1: +2 Total assist Stand Pivot Transfers: Patient Percentage: 50% Details for Transfer Assistance: Max cueing for NWBing RLE & use of UE's to off-load weight from LLE.  Pt with difficulty maintaining NWBing RLE during pivot as well as being able to take hop.  Assist required to achieve standing,  anterior weight shifting of trunk over BOS, tall posture, RW management, & to stick RLE out to maintain NWBing.   Ambulation/Gait Ambulation/Gait Assistance: Not tested (comment) Stairs: No Wheelchair Mobility Wheelchair Mobility: No      PT Goals Acute Rehab PT Goals Time For Goal Achievement: 07/20/11 Potential to Achieve Goals: Good PT Goal: Supine/Side to Sit - Progress: Progressing toward goal PT Goal: Sit to Stand - Progress: Progressing toward goal PT Goal: Stand to Sit - Progress: Progressing toward goal PT Transfer Goal: Bed to Chair/Chair to Bed - Progress: Progressing toward goal  Visit Information  Last PT Received On: 07/15/11 Assistance Needed: +2    Subjective Data  Subjective: "You know I have fibromyalgia"   Cognition  Overall Cognitive Status: Impaired Area of Impairment: Problem solving;Following commands Arousal/Alertness: Awake/alert Orientation Level: Appears intact for tasks assessed Behavior During Session: [facility] for tasks performed Following Commands: Follows one step commands with increased time;Follows multi-step commands inconsistently Problem Solving: Slow to process cues.  Cont's to require repeated cueing/instructions to carry out tasks.      Balance  Balance Balance Assessed: No  End of Session PT - End of Session Equipment Utilized During Treatment: Gait belt Activity Tolerance: Patient tolerated treatment well Patient left: in chair;with call bell/phone within reach;with family/visitor present    Verdell Face, PTA [phone] 07/15/2011

## 2011-07-15 NOTE — Clinical Social Work Placement (Signed)
     Clinical Social Work Department CLINICAL SOCIAL WORK PLACEMENT NOTE 07/15/2011  Patient:  [name], [name]  Account Number:  [id number] Admit date:  07/11/2011  Clinical Social Worker:  Lupita Leash Astoria Condon, BSW  Date/time:  07/14/2011 03:00 PM  Clinical Social Work is seeking post-discharge placement for this patient at the following level of care:   SKILLED NURSING   (*CSW will update this form in Epic as items are completed)     Patient/family provided with [facility] Department of Clinical Social Works list of facilities offering this level of care within the geographic area requested by the patient (or if unable, by the patients family).    Patient/family informed of their freedom to choose among providers that offer the needed level of care, that participate in Medicare, Medicaid or managed care program needed by the patient, have an available bed and are willing to accept the patient.    Patient/family informed of [facility] ownership interest in [facility], as well as of the fact that they are under no obligation to receive care at this facility.  PASARR submitted to EDS on 07/15/2011 PASARR number received from EDS on 07/15/2011  FL2 transmitted to all facilities in geographic area requested by pt/family on  07/15/2011 FL2 transmitted to all facilities within larger geographic area on   Patient informed that his/her managed care company has contracts with or will negotiate with  certain facilities, including the following:   [facility]     Patient/family informed of bed offers received:  07/15/2011 Patient chooses bed at [facility] [place] Physician recommends and patient chooses bed at    [facility] [place] on  07/15/2011 Patient to be transferred to facility by   The following physician request were entered in Epic:   Additional Comments: Patient is pleased wtih d/c to SNF today. she states  husband/family supports SNF plan.

## 2011-07-15 NOTE — Progress Notes (Signed)
Bed will be available to [facility] today.  Per MD- patient is medically ready for d/c to SNF.   Lorri Frederick. Makynna Manocchio, LCSWA  [phone]

## 2011-07-15 NOTE — Progress Notes (Signed)
Physical Therapy Treatment Patient Details Name: [name] MRN: [id number] DOB: 13-Dec-1955 Today's Date: 07/14/2011 Time: 8119-1478 PT Time Calculation (min): 22 min  PT Assessment / Plan / Recommendation Comments on Treatment Session  Pt unable to off load weight from LLE using UE's at this date.  Cont's to require +2 total assist for transfers.  Per chart review,  plans are for pt to d/c to SNF rather than CIR when appropriate.        Follow Up Recommendations  Skilled nursing facility    Barriers to Discharge        Equipment Recommendations  Defer to next venue    Recommendations for Other Services    Frequency Min 6X/week   Plan      Precautions / Restrictions Precautions Precautions: Fall Restrictions Weight Bearing Restrictions: Yes RLE Weight Bearing: Non weight bearing     Pertinent Vitals/Pain 6/10.  Premedicated.      Mobility  Bed Mobility Bed Mobility: Supine to Sit;Sitting - Scoot to Edge of Bed Supine to Sit: 3: Mod assist;With rails;HOB elevated (HOB elevated ~40 degrees) Sitting - Scoot to Edge of Bed: 3: Mod assist;With rail Details for Bed Mobility Assistance: Max directional cues/instructions for technique.  Pt with difficulty problem solving & required repetitive cueing.  Use of draw pad to pivot hips & bring hips closer to EOB.   Transfers Transfers: Sit to Stand;Stand to Sit;Stand Pivot Transfers Sit to Stand: 1: +2 Total assist;From bed;With upper extremity assist Sit to Stand: Patient Percentage: 60% Stand to Sit: 1: +2 Total assist;With armrests;To chair/3-in-1 Stand to Sit: Patient Percentage: 40% Stand Pivot Transfers: 1: +2 Total assist Stand Pivot Transfers: Patient Percentage: 30% Details for Transfer Assistance: Max directional cues for technique.  Pt still unable to off load weight from LLE.  Total assist for RW management during pivot, & assist to rotate hips from bed>recliner.   Ambulation/Gait Ambulation/Gait Assistance: Not tested  (comment) Wheelchair Mobility Wheelchair Mobility: No    Exercises      PT Goals Acute Rehab PT Goals Potential to Achieve Goals: Good PT Goal: Supine/Side to Sit - Progress: Progressing toward goal PT Goal: Sit to Stand - Progress: Progressing toward goal PT Goal: Stand to Sit - Progress: Progressing toward goal PT Transfer Goal: Bed to Chair/Chair to Bed - Progress: Progressing toward goal  Visit Information  Last PT Received On: 07/14/11 Assistance Needed: +2    Subjective Data  Subjective: "What are we trying to do?"   Cognition  Overall Cognitive Status: Impaired Area of Impairment: Problem solving Orientation Level: Disoriented to;Place;Time Behavior During Session: [facility] for tasks performed Problem Solving: Slow to process cues.  Required step-by-step instructions for tasks.   Cognition - Other Comments: Unable to state she was in hospital to family member on phone- "I'm at that therapy place".      Balance  Balance Balance Assessed: No  End of Session PT - End of Session Equipment Utilized During Treatment: Gait belt Patient left: in chair;with call bell/phone within reach    Verdell Face, [place] [phone] 07/14/2011  I agree with the above statement and assessment.  Lewis Shock, PT, DPT Pager #: [phone] Office #: [phone]

## 2011-07-15 NOTE — Progress Notes (Signed)
     Subjective:  Patient reports pain as moderate.  Otherwise doing well.    Objective:   VITALS:   Filed Vitals:   07/14/11 1549 07/14/11 2300 07/15/11 0456 07/15/11 0900  BP:  135/81 145/86 144/82  Pulse:  85 78 82  Temp:  98.4 F (36.9 C) 98.3 F (36.8 C) 98.5 F (36.9 C)  TempSrc:  Oral Oral Oral  Resp: 20 18 18 18   Height:      Weight:      SpO2: 98% 96% 99% 95%    Neurologically intact Sensation intact distally  LABS  No results found for this or any previous visit (from the past 24 hour(s)).  No results found.  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  NWB RLE.   Chalee Hirota P 07/15/2011, 1:08 PM   Teryl Lucy, MD Cell [phone] Pager [phone]

## 2011-08-06 ENCOUNTER — Telehealth: Payer: Self-pay | Admitting: *Deleted

## 2011-08-06 NOTE — Telephone Encounter (Signed)
Mildly elevated BP is likely a pain response.  No changes at this time.  Please continue to monitor BP and if consistently higher than 140/90 needs OV

## 2011-08-06 NOTE — Telephone Encounter (Signed)
Advised pt to monitor the BP and if the elevation is higher than 140/90 for three consecutive days to please call office to schedule OV, pt advised she will not be able to come in for OV due to inability to place weight on her leg per cast, this nurse asked if pt has crutches, pt notes she can not use crutches due to fibromyalgia, pt stated she will just continue to monitor BP and if elevation continues she will call our office for next steps, please note

## 2011-08-06 NOTE — Telephone Encounter (Signed)
noted 

## 2011-08-06 NOTE — Telephone Encounter (Signed)
Nurse called to report that Pt BP has been running for the past 2 days 130/90 pulse 96. Pt Pain level 5.

## 2011-08-20 ENCOUNTER — Ambulatory Visit: Payer: 59 | Admitting: Family Medicine

## 2011-08-21 ENCOUNTER — Ambulatory Visit: Payer: 59 | Admitting: Family Medicine

## 2011-08-25 ENCOUNTER — Other Ambulatory Visit: Payer: Self-pay | Admitting: Family Medicine

## 2011-08-25 NOTE — Telephone Encounter (Signed)
Fill () LYRICA 100 MG Take 1 capsule (100 mg total) by mouth 2 (two) times daily, last fill 7.26.13 Last ov 6.14.13 f/u for 729.1

## 2011-08-26 MED ORDER — PREGABALIN 150 MG PO CAPS: 150.0000 mg | ORAL_CAPSULE | Freq: Two times a day (BID) | ORAL | Status: DC

## 2011-08-26 NOTE — Telephone Encounter (Signed)
Called pt to verify dosage per noted recent notations in chart that medication has been discontinued, pt noted that after she broke her ankle she was placed in a nursing home and the house MD changed her dosage to Lyrica 150mg  once daily in the evenings, noted last dosage per our office was 100mg  BID, pt advised she is still taking the suggested dosage of 150mg  once daily with good results, please advise if new RX can be sent for pt now that she has returned home and tolerating medication well

## 2011-08-26 NOTE — Telephone Encounter (Signed)
Ok for Lyrica 150mg  daily, #30, 6 refills

## 2011-08-26 NOTE — Telephone Encounter (Signed)
.  rx faxed to pharmacy, manually.  

## 2011-09-19 ENCOUNTER — Encounter: Payer: Self-pay | Admitting: Family Medicine

## 2011-09-19 ENCOUNTER — Ambulatory Visit (INDEPENDENT_AMBULATORY_CARE_PROVIDER_SITE_OTHER): Payer: 59 | Admitting: Family Medicine

## 2011-09-19 VITALS — BP 132/84 | HR 85

## 2011-09-19 DIAGNOSIS — N898 Other specified noninflammatory disorders of vagina: Secondary | ICD-10-CM | POA: Insufficient documentation

## 2011-09-19 MED ORDER — CLINDAMYCIN HCL 300 MG PO CAPS: ORAL_CAPSULE | ORAL | Status: DC

## 2011-09-19 NOTE — Assessment & Plan Note (Signed)
Recurrent issue for pt.  Pt reports foul smelling d/c, no itch.  Most consistent w/ BV.  Attempted to get wet prep but it was very awkward and uncertain if sample was obtained.  Will start Clindamycin for presumed BV (pt allergic to flagyl).  Reviewed supportive care and red flags that should prompt return.  Pt expressed understanding and is in agreement w/ plan.

## 2011-09-19 NOTE — Patient Instructions (Addendum)
Start the Clindamycin twice daily for 7 days to treat presumed BV We'll notify you of your lab results Call with any questions or concerns Hang in there!

## 2011-09-19 NOTE — Progress Notes (Signed)
  Subjective:    Patient ID: [name], female    DOB: October 31, 1955, 56 y.o.   MRN: [id number]  HPI Vaginal d/c- foul smelling, first noticed over a month ago.  Not itchy.  Unable to relay whether d/c is thin and watery or thick and clumpy.  No fevers.  Allergy to flagyl.  Hx of similar.   Review of Systems For ROS see HPI.    Objective:   Physical Exam  Vitals reviewed. Constitutional: She appears well-developed and well-nourished. No distress.  Genitourinary:       Pt unable to get onto exam table, attempted to perform wet prep while pt in standing position          Assessment & Plan:

## 2011-09-20 LAB — WET PREP BY MOLECULAR PROBE
Candida species: NEGATIVE
Trichomonas vaginosis: NEGATIVE

## 2011-09-29 ENCOUNTER — Other Ambulatory Visit: Payer: Self-pay | Admitting: Family Medicine

## 2011-09-29 NOTE — Telephone Encounter (Signed)
meloxicam 15 mg #30 Take 1 tablet by mouth once daily  Last refill--08/25/11

## 2011-09-30 NOTE — Telephone Encounter (Signed)
Last OV 09-19-11 last refill 05-16-11 #30 with 3 refills, pt advised she is taking this medication per noted in notation on 05-16-11 that this medication was d/c'd due to discharge

## 2011-10-01 ENCOUNTER — Telehealth: Payer: Self-pay

## 2011-10-01 MED ORDER — MELOXICAM 15 MG PO TABS: 15.0000 mg | ORAL_TABLET | Freq: Every day | ORAL | Status: DC

## 2011-10-01 NOTE — Telephone Encounter (Signed)
rx sent to pharmacy by e-script  

## 2011-10-01 NOTE — Telephone Encounter (Signed)
Ok for #30, 3 refills 

## 2011-10-01 NOTE — Telephone Encounter (Addendum)
Msg from patient who is requesting clarification on medication. Name of medication not left.      KP

## 2011-10-02 NOTE — Telephone Encounter (Signed)
Pt notes that she broke her ankle July 5th, was operated on, was sent to [facility] and notes the MD Kevan Ny at [facility] ordered hydrocodone and wanted another refill but the MD at [facility] stated he will no longer refill per no longer in the hospital, noted pt was given last refill from ortho MD, per MD Beverely Low instructions, advised pt to call the ortho MD to get the refill, pt understood

## 2011-10-06 ENCOUNTER — Telehealth: Payer: Self-pay | Admitting: *Deleted

## 2011-10-06 NOTE — Telephone Encounter (Signed)
Pt called back in to advise that MD Dion Saucier will not fill her pain medications, advised that our office will call their office and call her back, pt understood.

## 2011-10-06 NOTE — Telephone Encounter (Signed)
This nurse contacted MD Dion Saucier office and spoke with Nettie Elm she took notation and will have MD call office back

## 2011-10-07 NOTE — Telephone Encounter (Signed)
Called ortho MD Dion Saucier office back at [phone] to get an update on concern for pt medication refill, was advised that MD Dion Saucier did receive the notation at 3:30pm yesterday afternoon however he has been in surgery all morning thus a update is taking longer per not in the office, advised MD Tabori about update and will address when call back received,

## 2011-10-29 NOTE — Telephone Encounter (Signed)
Since 3 weeks have gone by and no f/u from either party, will close encounter.

## 2011-10-29 NOTE — Telephone Encounter (Signed)
No incoming call noted at this point from MD [facility] nor pt, please note and advise

## 2011-10-30 NOTE — Telephone Encounter (Signed)
Noted  

## 2011-11-03 ENCOUNTER — Telehealth: Payer: Self-pay | Admitting: Family Medicine

## 2011-11-03 MED ORDER — FENOFIBRATE 160 MG PO TABS: 160.0000 mg | ORAL_TABLET | Freq: Every day | ORAL | Status: DC

## 2011-11-03 NOTE — Telephone Encounter (Signed)
rx sent to pharmacy by e-script to last til CPE

## 2011-11-03 NOTE — Telephone Encounter (Signed)
Refill: Fenofibrate 160 mg tab #30. Take 1 tablet by mouth daily. Qty 10-06-11

## 2011-11-03 NOTE — Telephone Encounter (Signed)
No labs tomorrow- will wait until CPE

## 2011-11-03 NOTE — Telephone Encounter (Signed)
Please note pt last labs 05-16-11 for lipids, has CPE apt on 01-20-11, also noted pt is coming in tomorrow for flu vaccine would you like her to have labs drawn at flu clinic OV?

## 2011-11-04 ENCOUNTER — Other Ambulatory Visit: Payer: Self-pay | Admitting: Family Medicine

## 2011-11-04 ENCOUNTER — Ambulatory Visit: Payer: 59

## 2011-11-04 NOTE — Telephone Encounter (Signed)
Ok for #30, 3 refills 

## 2011-11-04 NOTE — Telephone Encounter (Signed)
Last OV 09-19-11 acute, last refill 06-20-11 #30 with 3 refills

## 2011-11-04 NOTE — Telephone Encounter (Signed)
refill  TraZODone HCl (Tab) 50 MG Take 0.5-1 tablets (25-50 mg total) by mouth at bedtime as needed for sleep. # 30 last fill 10.8.13--last ov acute 9.13.13

## 2011-11-05 ENCOUNTER — Ambulatory Visit (INDEPENDENT_AMBULATORY_CARE_PROVIDER_SITE_OTHER): Payer: 59

## 2011-11-05 DIAGNOSIS — Z23 Encounter for immunization: Secondary | ICD-10-CM

## 2011-11-05 MED ORDER — TRAZODONE HCL 50 MG PO TABS: 25.0000 mg | ORAL_TABLET | Freq: Every evening | ORAL | Status: DC | PRN

## 2011-11-05 NOTE — Telephone Encounter (Signed)
rx sent to pharmacy by e-script  

## 2011-11-20 ENCOUNTER — Ambulatory Visit (INDEPENDENT_AMBULATORY_CARE_PROVIDER_SITE_OTHER): Payer: 59 | Admitting: Family Medicine

## 2011-11-20 VITALS — BP 122/90 | HR 82 | Temp 98.6°F | Wt 215.0 lb

## 2011-11-20 DIAGNOSIS — J019 Acute sinusitis, unspecified: Secondary | ICD-10-CM

## 2011-11-20 MED ORDER — BENZONATATE 200 MG PO CAPS: 200.0000 mg | ORAL_CAPSULE | Freq: Three times a day (TID) | ORAL | Status: DC | PRN

## 2011-11-20 MED ORDER — CLARITHROMYCIN ER 500 MG PO TB24: 1000.0000 mg | ORAL_TABLET | Freq: Every day | ORAL | Status: AC

## 2011-11-20 NOTE — Progress Notes (Signed)
  Subjective:    Patient ID: [name], female    DOB: 17-Mar-1955, 56 y.o.   MRN: [id number]  HPI ? Sinus infxn- 'my head is about to bust'.  sxs started 2-3 days ago.  + congestion.  + sinus pain.  No tooth pain.  Subjective fever last night.  L ear pain.  + cough- not productive, 'dry hacky'.  + sick contacts.   Review of Systems For ROS see HPI     Objective:   Physical Exam  Vitals reviewed. Constitutional: She appears well-developed and well-nourished. No distress.  HENT:  Head: Normocephalic and atraumatic.  Right Ear: Tympanic membrane normal.  Left Ear: Tympanic membrane normal.  Nose: Mucosal edema and rhinorrhea present. Right sinus exhibits maxillary sinus tenderness. Right sinus exhibits no frontal sinus tenderness. Left sinus exhibits maxillary sinus tenderness. Left sinus exhibits no frontal sinus tenderness.  Mouth/Throat: Uvula is midline and mucous membranes are normal. Posterior oropharyngeal erythema present. No oropharyngeal exudate.  Eyes: Conjunctivae normal and EOM are normal. Pupils are equal, round, and reactive to light.  Neck: Normal range of motion. Neck supple.  Cardiovascular: Normal rate, regular rhythm and normal heart sounds.   Pulmonary/Chest: Effort normal and breath sounds normal. No respiratory distress. She has no wheezes.  Lymphadenopathy:    She has no cervical adenopathy.          Assessment & Plan:

## 2011-11-20 NOTE — Patient Instructions (Addendum)
This appears to be a maxillary sinus infection Start the Biaxin- 2 tabs at the same time- w/ food Use the Tessalon as needed for cough Drink plenty of fluids REST!!! Hang in there!!!

## 2011-11-23 NOTE — Assessment & Plan Note (Signed)
Pt's sxs and PE consistent w/ infxn.  Start abx.  Reviewed supportive care and red flags that should prompt return.  Pt expressed understanding and is in agreement w/ plan.  

## 2011-12-05 ENCOUNTER — Ambulatory Visit (INDEPENDENT_AMBULATORY_CARE_PROVIDER_SITE_OTHER): Payer: 59 | Admitting: Family Medicine

## 2011-12-05 ENCOUNTER — Encounter: Payer: Self-pay | Admitting: Family Medicine

## 2011-12-05 VITALS — BP 112/70 | HR 95 | Temp 99.0°F | Wt 210.8 lb

## 2011-12-05 DIAGNOSIS — R300 Dysuria: Secondary | ICD-10-CM

## 2011-12-05 LAB — POCT URINALYSIS DIPSTICK
Spec Grav, UA: 1.01
Urobilinogen, UA: 0.2
pH, UA: 7

## 2011-12-05 MED ORDER — NITROFURANTOIN MONOHYD MACRO 100 MG PO CAPS: 100.0000 mg | ORAL_CAPSULE | Freq: Two times a day (BID) | ORAL | Status: DC

## 2011-12-05 NOTE — Progress Notes (Signed)
                    Subjective:    [name] is a 56 y.o. female who complains of burning with urination and frequency. She has had symptoms for 3 days. Patient also complains of none. Patient denies back pain, congestion, cough, fever, headache, rhinitis, sorethroat, stomach ache and vaginal discharge. Patient does not have a history of recurrent UTI. Patient does not have a history of pyelonephritis.   The following portions of the patient's history were reviewed and updated as appropriate: allergies, current medications, past family history, past medical history, past social history, past surgical history and problem list.  Review of Systems Pertinent items are noted in HPI.    Objective:    BP 112/70  Pulse 95  Temp 99 F (37.2 C) (Oral)  Wt 210 lb 12.8 oz (95.618 kg)  SpO2 97% General appearance: alert, cooperative, appears stated age and no distress Abdomen: soft, non-tender; bowel sounds normal; no masses,  no organomegaly  Laboratory:  Urine dipstick: large for hemoglobin, large for leukocyte esterase and pos for nitrites.   Micro exam: not done.    Assessment:    UTI     Plan:    Medications: nitrofurantoin. Maintain adequate hydration. Follow up if symptoms not improving, and as needed.

## 2011-12-05 NOTE — Patient Instructions (Signed)
Urinary Tract Infection Urinary tract infections (UTIs) can develop anywhere along your urinary tract. Your urinary tract is your body's drainage system for removing wastes and extra water. Your urinary tract includes two kidneys, two ureters, a bladder, and a urethra. Your kidneys are a pair of bean-shaped organs. Each kidney is about the size of your fist. They are located below your ribs, one on each side of your spine. CAUSES Infections are caused by microbes, which are microscopic organisms, including fungi, viruses, and bacteria. These organisms are so small that they can only be seen through a microscope. Bacteria are the microbes that most commonly cause UTIs. SYMPTOMS  Symptoms of UTIs may vary by age and gender of the patient and by the location of the infection. Symptoms in young women typically include a frequent and intense urge to urinate and a painful, burning feeling in the bladder or urethra during urination. Older women and men are more likely to be tired, shaky, and weak and have muscle aches and abdominal pain. A fever may mean the infection is in your kidneys. Other symptoms of a kidney infection include pain in your back or sides below the ribs, nausea, and vomiting. DIAGNOSIS To diagnose a UTI, your caregiver will ask you about your symptoms. Your caregiver also will ask to provide a urine sample. The urine sample will be tested for bacteria and white blood cells. White blood cells are made by your body to help fight infection. TREATMENT  Typically, UTIs can be treated with medication. Because most UTIs are caused by a bacterial infection, they usually can be treated with the use of antibiotics. The choice of antibiotic and length of treatment depend on your symptoms and the type of bacteria causing your infection. HOME CARE INSTRUCTIONS  If you were prescribed antibiotics, take them exactly as your caregiver instructs you. Finish the medication even if you feel better after you  have only taken some of the medication.  Drink enough water and fluids to keep your urine clear or pale yellow.  Avoid caffeine, tea, and carbonated beverages. They tend to irritate your bladder.  Empty your bladder often. Avoid holding urine for long periods of time.  Empty your bladder before and after sexual intercourse.  After a bowel movement, women should cleanse from front to back. Use each tissue only once. SEEK MEDICAL CARE IF:   You have back pain.  You develop a fever.  Your symptoms do not begin to resolve within 3 days. SEEK IMMEDIATE MEDICAL CARE IF:   You have severe back pain or lower abdominal pain.  You develop chills.  You have nausea or vomiting.  You have continued burning or discomfort with urination. MAKE SURE YOU:   Understand these instructions.  Will watch your condition.  Will get help right away if you are not doing well or get worse. Document Released: 10/02/2004 Document Revised: 06/24/2011 Document Reviewed: 01/31/2011 Exit[employer] Patient Information 2013 [employer].  

## 2011-12-09 ENCOUNTER — Telehealth: Payer: Self-pay | Admitting: Family Medicine

## 2011-12-09 ENCOUNTER — Telehealth: Payer: Self-pay

## 2011-12-09 MED ORDER — SULFAMETHOXAZOLE-TRIMETHOPRIM 800-160 MG PO TABS: 1.0000 | ORAL_TABLET | Freq: Two times a day (BID) | ORAL | Status: DC

## 2011-12-09 MED ORDER — ROSUVASTATIN CALCIUM 10 MG PO TABS: 10.0000 mg | ORAL_TABLET | ORAL | Status: DC

## 2011-12-09 NOTE — Telephone Encounter (Signed)
Spoke with pt and informed her to be fasting at her next appt in an month.   Also informed the pt I will refill her Crestor to [facility].//AB/CMA

## 2011-12-09 NOTE — Telephone Encounter (Signed)
Discussed with patient and she voiced understanding, she agreed to stop the Macrobid and will take the Bactrim DS and she will call back to scheduled the follow up apt.      KP

## 2011-12-09 NOTE — Telephone Encounter (Signed)
Message copied by Arnette Norris on Tue Dec 09, 2011  4:38 PM ------      Message from: [name]      Created: Mon Dec 08, 2011  5:06 PM       Urine culture showed intermediate sensitivity to Macrobid. Recommend to switch to Bactrim DS 1 by mouth twice a day for 5 days. Please call in a prescription and let patient know.      Recheck a urine culture in 2 weeks

## 2011-12-09 NOTE — Telephone Encounter (Signed)
Pt requests refill on Crestor 10mg  be sent to [facility]. She has appt next month.

## 2011-12-22 ENCOUNTER — Ambulatory Visit (INDEPENDENT_AMBULATORY_CARE_PROVIDER_SITE_OTHER): Payer: 59 | Admitting: Family Medicine

## 2011-12-22 ENCOUNTER — Encounter: Payer: Self-pay | Admitting: Lab

## 2011-12-22 VITALS — BP 120/82 | HR 80 | Wt 208.0 lb

## 2011-12-22 DIAGNOSIS — R300 Dysuria: Secondary | ICD-10-CM

## 2011-12-22 DIAGNOSIS — N390 Urinary tract infection, site not specified: Secondary | ICD-10-CM

## 2011-12-22 LAB — POCT URINALYSIS DIPSTICK
Bilirubin, UA: NEGATIVE
Blood, UA: NEGATIVE
Glucose, UA: NEGATIVE
Spec Grav, UA: 1.01
Urobilinogen, UA: 0.2

## 2011-12-22 NOTE — Patient Instructions (Addendum)
Your urine is clear and it looks like the infection has completely resolved. Continue to drink plenty of fluids Call with any questions or concerns Altamese Cabal Christmas!!!!

## 2011-12-22 NOTE — Progress Notes (Signed)
  Subjective:    Patient ID: [name], female    DOB: [birth year], 56 y.o.   MRN: [id number]  HPI UTI- pt was seen and dx'd on 11/29.  Was started on macrobid but this was found to be resistant.  Switched to Bactrim.  Here today for test of cure.  No burning w/ urination, frequency, urgency, fevers, or back pain.  Completed abx course.   Review of Systems For ROS see HPI     Objective:   Physical Exam  Vitals reviewed. Constitutional: She appears well-developed and well-nourished. No distress.  Abdominal: Soft. She exhibits no distension. There is no tenderness (no suprapubic or CVA tenderness).          Assessment & Plan:

## 2011-12-23 NOTE — Assessment & Plan Note (Signed)
Recurrent problem for pt.  Initial abx choice was resistant so pt was switched.  Currently asymptomatic and UA clear- no evidence of lingering infxn.  Pt aware.

## 2011-12-29 ENCOUNTER — Other Ambulatory Visit: Payer: Self-pay | Admitting: Family Medicine

## 2011-12-29 MED ORDER — MELOXICAM 15 MG PO TABS: 15.0000 mg | ORAL_TABLET | Freq: Every day | ORAL | Status: DC

## 2011-12-29 NOTE — Telephone Encounter (Signed)
refill Meloxicam (Tab) 15 MG Take 1 tablet (15 mg total) by mouth daily. #30 last fill 11.25.13

## 2011-12-29 NOTE — Telephone Encounter (Signed)
Please advise on RF request.//AB/CMA 

## 2011-12-29 NOTE — Telephone Encounter (Signed)
Ok for #30, 3 refills 

## 2011-12-29 NOTE — Telephone Encounter (Signed)
Rx was sent to pharmacy by e-script.//AB/CMA

## 2012-01-19 ENCOUNTER — Encounter: Payer: Self-pay | Admitting: Lab

## 2012-01-20 ENCOUNTER — Encounter: Payer: 59 | Admitting: Family Medicine

## 2012-02-02 ENCOUNTER — Other Ambulatory Visit: Payer: Self-pay | Admitting: Family Medicine

## 2012-02-02 DIAGNOSIS — E7800 Pure hypercholesterolemia, unspecified: Secondary | ICD-10-CM

## 2012-02-02 MED ORDER — FENOFIBRATE 160 MG PO TABS: 160.0000 mg | ORAL_TABLET | Freq: Every day | ORAL | Status: DC

## 2012-02-02 NOTE — Telephone Encounter (Signed)
Refills x 2 1-Meloxicam 15MG  Tab  #30 take one tablet by mouth once daily, last fill 12.23.13  2-fenofibrate 160mg  tab #30 take one tablet by mouth once daily, last fill 12.30.13

## 2012-02-02 NOTE — Telephone Encounter (Signed)
Please advise on RF request for Meloxicam 15 mg.  Last OV:01-01-12.//AB/CMA    Rx for Fenofibrate 160mg  sent to the pharmacy by e-script.//AB/CMA

## 2012-02-03 MED ORDER — MELOXICAM 15 MG PO TABS: 15.0000 mg | ORAL_TABLET | Freq: Every day | ORAL | Status: DC

## 2012-02-03 NOTE — Telephone Encounter (Signed)
Rx sent to the pharmacy by e-script.//AB/CMA 

## 2012-02-03 NOTE — Telephone Encounter (Signed)
Ok for Meloxicam #30, 3 refills

## 2012-02-09 ENCOUNTER — Telehealth: Payer: Self-pay | Admitting: Family Medicine

## 2012-02-09 MED ORDER — OMEPRAZOLE 20 MG PO CPDR: 20.0000 mg | DELAYED_RELEASE_CAPSULE | Freq: Every day | ORAL | Status: DC

## 2012-02-09 NOTE — Telephone Encounter (Signed)
I don't see that pt has been on Prilosec or Omeprazole previously- has she been taking this OTC?

## 2012-02-09 NOTE — Telephone Encounter (Signed)
Spoke with the pt and asked if she have been on Prilosec or Omeprazole previously.  Asked her if she has been taking this OTC.  Pt stated she has been taking Prilosec OTC.  Informed her we will sent a new rx to her pharmacy.  Pt agreed.  Rx sent to the pharmacy by e-script.//AB/CMA

## 2012-02-09 NOTE — Telephone Encounter (Signed)
LM @ (4:50pm) asking the pt to RTC.//AB/CMA 

## 2012-02-09 NOTE — Telephone Encounter (Signed)
Please advise on request.//AB/CMA

## 2012-02-09 NOTE — Telephone Encounter (Signed)
PT Would like a generic of Prilosec, sent to [facility]--pt stated she was told generic is Omepralzole DR 20MG  pt cb# [phone]

## 2012-02-23 ENCOUNTER — Telehealth: Payer: Self-pay | Admitting: Family Medicine

## 2012-02-23 NOTE — Telephone Encounter (Signed)
Refill: Lyrica 150 mg #30. Last fill 01-27-12

## 2012-02-23 NOTE — Telephone Encounter (Signed)
Refill: trazodone hcl 50 mg tab #30. Take 1/2 to 1 tablet by mouth at bedtime as needed for sleep. Last fill 01-27-12

## 2012-02-24 MED ORDER — PREGABALIN 150 MG PO CAPS: 150.0000 mg | ORAL_CAPSULE | Freq: Two times a day (BID) | ORAL | Status: DC

## 2012-02-24 MED ORDER — TRAZODONE HCL 50 MG PO TABS: 25.0000 mg | ORAL_TABLET | Freq: Every evening | ORAL | Status: DC | PRN

## 2012-02-24 NOTE — Telephone Encounter (Signed)
Ok for 30 of each w/ 3 refills

## 2012-02-24 NOTE — Telephone Encounter (Signed)
Rx sent to the pharmacy([facility]) by e-script and fax.//AB/CMA

## 2012-02-24 NOTE — Telephone Encounter (Signed)
Please advise on RF request.  Last OV for depression:06-20-11.//AB/CMA

## 2012-02-27 ENCOUNTER — Encounter: Payer: Self-pay | Admitting: Lab

## 2012-03-01 ENCOUNTER — Encounter: Payer: Self-pay | Admitting: Family Medicine

## 2012-03-01 ENCOUNTER — Ambulatory Visit (INDEPENDENT_AMBULATORY_CARE_PROVIDER_SITE_OTHER): Payer: 59 | Admitting: Family Medicine

## 2012-03-01 ENCOUNTER — Telehealth: Payer: Self-pay | Admitting: Family Medicine

## 2012-03-01 VITALS — BP 100/72 | HR 89 | Temp 97.9°F | Ht 62.0 in | Wt 206.6 lb

## 2012-03-01 DIAGNOSIS — E785 Hyperlipidemia, unspecified: Secondary | ICD-10-CM

## 2012-03-01 DIAGNOSIS — Z01419 Encounter for gynecological examination (general) (routine) without abnormal findings: Secondary | ICD-10-CM

## 2012-03-01 DIAGNOSIS — N951 Menopausal and female climacteric states: Secondary | ICD-10-CM

## 2012-03-01 LAB — BASIC METABOLIC PANEL
BUN: 19 mg/dL (ref 6–23)
Chloride: 104 mEq/L (ref 96–112)
Creatinine, Ser: 1.2 mg/dL (ref 0.4–1.2)
Glucose, Bld: 82 mg/dL (ref 70–99)
Potassium: 3.5 mEq/L (ref 3.5–5.1)

## 2012-03-01 LAB — LIPID PANEL
Cholesterol: 181 mg/dL (ref 0–200)
HDL: 54.2 mg/dL (ref >39.00)
LDL Cholesterol: 87 mg/dL (ref 0–99)
VLDL: 40 mg/dL (ref 0.0–40.0)

## 2012-03-01 LAB — CBC WITH DIFFERENTIAL/PLATELET
Basophils Relative: 0.7 % (ref 0.0–3.0)
Eosinophils Relative: 2.6 % (ref 0.0–5.0)
Hemoglobin: 11.8 g/dL — ABNORMAL LOW (ref 12.0–15.0)
Lymphocytes Relative: 28.5 % (ref 12.0–46.0)
MCHC: 33.4 g/dL (ref 30.0–36.0)
MCV: 86.9 fl (ref 78.0–100.0)
Monocytes Absolute: 0.4 10*3/uL (ref 0.1–1.0)
Neutro Abs: 2.9 10*3/uL (ref 1.4–7.7)
Neutrophils Relative %: 59.2 % (ref 43.0–77.0)
RBC: 4.05 Mil/uL (ref 3.87–5.11)
WBC: 4.9 10*3/uL (ref 4.5–10.5)

## 2012-03-01 LAB — HEPATIC FUNCTION PANEL
ALT: 16 U/L (ref 0–35)
AST: 20 U/L (ref 0–37)
Albumin: 4.2 g/dL (ref 3.5–5.2)

## 2012-03-01 LAB — TSH: TSH: 1.03 u[IU]/mL (ref 0.35–5.50)

## 2012-03-01 NOTE — Patient Instructions (Addendum)
Follow up in 6 months to recheck cholesterol- sooner if needed We'll notify you of your lab results and make any changes if needed Try and get regular exercise- this will help your fibromyalgia We'll try and do your mammogram and bone density at the same time Call with any questions or concerns Happy Spring!

## 2012-03-01 NOTE — Assessment & Plan Note (Signed)
Pt's PE WNL w/ exception of obesity.  UTD on healthy maintenance w/ exception of DEXA- order entered.  Check labs.  Anticipatory guidance provided.

## 2012-03-01 NOTE — Telephone Encounter (Signed)
refill Sulfamethoxazole-Trimethoprim (Tab)  800-160 MG Take 1 tablet by mouth 2 (two) times daily. #10-last fill 12.3.13

## 2012-03-01 NOTE — Progress Notes (Signed)
  Subjective:    Patient ID: [name], female    DOB: 01/22/1955, 57 y.o.   MRN: [id number]  HPI CPE- UTD on pap, mammo, colonoscopy.  Overdue on DEXA.   Review of Systems Patient reports no vision/ hearing changes, adenopathy,fever, weight change,  persistant/recurrent hoarseness , swallowing issues, chest pain, palpitations, edema, persistant/recurrent cough, hemoptysis, dyspnea (rest/exertional/paroxysmal nocturnal), gastrointestinal bleeding (melena, rectal bleeding), abdominal pain, significant heartburn, bowel changes, GU symptoms (dysuria, hematuria, incontinence), Gyn symptoms (abnormal  bleeding, pain),  syncope, focal weakness, memory loss, numbness & tingling, skin/hair/nail changes, abnormal bruising or bleeding, anxiety, or depression.     Objective:   Physical Exam General Appearance:    Alert, cooperative, no distress, appears stated age  Head:    Normocephalic, without obvious abnormality, atraumatic  Eyes:    PERRL, conjunctiva/corneas clear, EOM's intact, fundi    benign, both eyes  Ears:    Normal TM's and external ear canals, both ears  Nose:   Nares normal, septum midline, mucosa normal, no drainage    or sinus tenderness  Throat:   Lips, mucosa, and tongue normal; teeth and gums normal  Neck:   Supple, symmetrical, trachea midline, no adenopathy;    Thyroid: no enlargement/tenderness/nodules  Back:     Symmetric, ROM normal, no CVA tenderness  Lungs:     Clear to auscultation bilaterally, respirations unlabored  Chest Wall:    No tenderness or deformity   Heart:    Regular rate and rhythm, S1 and S2 normal, no murmur, rub   or gallop  Breast Exam:    Deferred to mammo at pt's request  Abdomen:     Soft, non-tender, bowel sounds active all four quadrants, limited exam due to pt's inability to get on table  Genitalia:    Deferred at pt's request  Rectal:    Extremities:   Extremities normal, atraumatic, no cyanosis or edema  Pulses:   2+ and symmetric all  extremities  Skin:   Skin color, texture, turgor normal, no rashes or lesions  Lymph nodes:   Cervical, supraclavicular, and axillary nodes normal  Neurologic:   CNII-XII intact, normal strength, sensation and reflexes    throughout          Assessment & Plan:

## 2012-03-02 NOTE — Telephone Encounter (Signed)
No refill on abx- no reason for abx

## 2012-03-02 NOTE — Telephone Encounter (Signed)
Spoke with the pt and she stated that she did not request a refill on the ATB.//AB/CMA

## 2012-03-02 NOTE — Telephone Encounter (Signed)
Patient was seen yesterday for CPX, please advise on request for ABX

## 2012-03-04 LAB — VITAMIN D 1,25 DIHYDROXY
Vitamin D 1, 25 (OH)2 Total: 45 pg/mL (ref 18–72)
Vitamin D3 1, 25 (OH)2: 45 pg/mL

## 2012-03-05 ENCOUNTER — Encounter: Payer: Self-pay | Admitting: *Deleted

## 2012-03-06 ENCOUNTER — Other Ambulatory Visit: Payer: Self-pay | Admitting: Family Medicine

## 2012-03-17 ENCOUNTER — Other Ambulatory Visit: Payer: 59

## 2012-03-30 ENCOUNTER — Other Ambulatory Visit: Payer: Self-pay

## 2012-03-30 DIAGNOSIS — Z1231 Encounter for screening mammogram for malignant neoplasm of breast: Secondary | ICD-10-CM

## 2012-05-10 ENCOUNTER — Ambulatory Visit: Payer: 59

## 2012-05-10 ENCOUNTER — Other Ambulatory Visit: Payer: 59

## 2012-06-14 ENCOUNTER — Ambulatory Visit
Admission: RE | Admit: 2012-06-14 | Discharge: 2012-06-14 | Disposition: A | Discharge status: Home / Self Care | Payer: 59 | Source: Ambulatory Visit | Attending: Family Medicine | Admitting: Family Medicine

## 2012-06-14 ENCOUNTER — Ambulatory Visit
Admission: RE | Admit: 2012-06-14 | Discharge: 2012-06-14 | Disposition: A | Discharge status: Home / Self Care | Payer: 59 | Source: Ambulatory Visit

## 2012-06-14 DIAGNOSIS — N951 Menopausal and female climacteric states: Secondary | ICD-10-CM

## 2012-06-14 DIAGNOSIS — Z1231 Encounter for screening mammogram for malignant neoplasm of breast: Secondary | ICD-10-CM

## 2012-06-16 ENCOUNTER — Telehealth: Payer: Self-pay | Admitting: Family Medicine

## 2012-06-16 NOTE — Telephone Encounter (Signed)
Patient would like to speak with someone regarding her BMD results. She states she has been taking the supplements/medications that Dr. Beverely Low has recommended. CB# [phone]

## 2012-06-17 ENCOUNTER — Encounter: Payer: Self-pay | Admitting: Family Medicine

## 2012-06-17 ENCOUNTER — Ambulatory Visit (INDEPENDENT_AMBULATORY_CARE_PROVIDER_SITE_OTHER): Payer: 59 | Admitting: Family Medicine

## 2012-06-17 ENCOUNTER — Telehealth: Payer: Self-pay | Admitting: General Practice

## 2012-06-17 VITALS — BP 110/90 | HR 78 | Temp 97.7°F | Ht 62.0 in | Wt 200.4 lb

## 2012-06-17 DIAGNOSIS — G43909 Migraine, unspecified, not intractable, without status migrainosus: Secondary | ICD-10-CM

## 2012-06-17 MED ORDER — PROMETHAZINE HCL 25 MG/ML IJ SOLN
25.0000 mg | Freq: Once | INTRAMUSCULAR | Status: AC
  Administered 2012-06-17: 25 mg via INTRAMUSCULAR

## 2012-06-17 MED ORDER — KETOROLAC TROMETHAMINE 60 MG/2ML IM SOLN
60.0000 mg | Freq: Once | INTRAMUSCULAR | Status: AC
  Administered 2012-06-17: 60 mg via INTRAMUSCULAR

## 2012-06-17 MED ORDER — TRAZODONE HCL 50 MG PO TABS: 25.0000 mg | ORAL_TABLET | Freq: Every evening | ORAL | Status: DC | PRN

## 2012-06-17 NOTE — Telephone Encounter (Signed)
Please advise on the dexa results.

## 2012-06-17 NOTE — Telephone Encounter (Signed)
Spoke with pt. She states that she does not remember what Angie told her about the results (was at parents house). States she only wrote down what she needed to buy. Did not want me to go over the results due to her coming in for an appt at 2:15 today.

## 2012-06-17 NOTE — Telephone Encounter (Signed)
Pt spoke w/ Angie and reviewed results.  (see the results under imaging)

## 2012-06-17 NOTE — Assessment & Plan Note (Signed)
Recurrent issue.  No red flags on hx or PE.  Pt typically does well w/ phenergan/toradol combo.  Reviewed supportive care and red flags that should prompt return.  Pt expressed understanding and is in agreement w/ plan.

## 2012-06-17 NOTE — Progress Notes (Signed)
  Subjective:    Patient ID: [name], female    DOB: October 31, 1955, 57 y.o.   MRN: [id number]  HPI 'i've got a bad headache'- sxs started 3 days ago.  Feels similar to other migraines.  No vomiting.  No nausea.  No visual changes.  + photophobia, mild phonophobia.  L frontoparietal.  Took excedrin migraine w/out relief- 'it would just knock the top off'.  No focal neuro deficits- weakness, numbness, facial droop, slurred speech.  Pt reports slowed thought process but feels this is due to pain.   Review of Systems For ROS see HPI     Objective:   Physical Exam  Vitals reviewed. Constitutional: She is oriented to person, place, and time. She appears well-developed and well-nourished. No distress.  HENT:  Head: Normocephalic and atraumatic.  TMs WNL No TTP over sinuses Minimal nasal congestion  Eyes: Conjunctivae and EOM are normal. Pupils are equal, round, and reactive to light.  Neck: Normal range of motion. Neck supple.  Cardiovascular: Normal rate, regular rhythm, normal heart sounds and intact distal pulses.   Pulmonary/Chest: Effort normal and breath sounds normal. No respiratory distress. She has no wheezes. She has no rales.  Lymphadenopathy:    She has no cervical adenopathy.  Neurological: She is alert and oriented to person, place, and time. She has normal reflexes. No cranial nerve deficit. Coordination normal.  Psychiatric: She has a normal mood and affect. Her behavior is normal. Judgment and thought content normal.          Assessment & Plan:

## 2012-06-17 NOTE — Telephone Encounter (Signed)
Trazodone refill Last OV 06-17-2012 Med filled 02-24-2012 #30 with 3 refills

## 2012-06-17 NOTE — Telephone Encounter (Signed)
Ok for #30, 3 refills 

## 2012-06-17 NOTE — Patient Instructions (Addendum)
Start taking the Lyrica twice daily Go home and rest- let the medicines take effect If no improvement by tomorrow, call us! Hang in there!

## 2012-06-17 NOTE — Telephone Encounter (Signed)
Med filled.  

## 2012-06-21 ENCOUNTER — Telehealth: Payer: Self-pay | Admitting: Family Medicine

## 2012-06-21 MED ORDER — PREGABALIN 150 MG PO CAPS: 150.0000 mg | ORAL_CAPSULE | Freq: Two times a day (BID) | ORAL | Status: DC

## 2012-06-21 NOTE — Telephone Encounter (Signed)
Patient states she was directed to take lyrica 2 x daily, but we only gave her 30 pills. She would like the rx re-sent for #60. She uses [facility]

## 2012-06-21 NOTE — Telephone Encounter (Signed)
Please advise ok to increase quantity

## 2012-06-21 NOTE — Telephone Encounter (Signed)
Rx printed and faxed to the pharmacy.//AB/CMA 

## 2012-06-21 NOTE — Telephone Encounter (Signed)
Ok to increase to #60, 3 refills

## 2012-06-30 ENCOUNTER — Other Ambulatory Visit: Payer: Self-pay | Admitting: Family Medicine

## 2012-07-19 ENCOUNTER — Encounter: Payer: Self-pay | Admitting: Family Medicine

## 2012-07-19 ENCOUNTER — Ambulatory Visit (INDEPENDENT_AMBULATORY_CARE_PROVIDER_SITE_OTHER): Payer: 59 | Admitting: Family Medicine

## 2012-07-19 VITALS — BP 120/90 | HR 87 | Temp 98.6°F | Wt 195.6 lb

## 2012-07-19 DIAGNOSIS — G43909 Migraine, unspecified, not intractable, without status migrainosus: Secondary | ICD-10-CM

## 2012-07-19 MED ORDER — PROMETHAZINE HCL 25 MG/ML IJ SOLN
25.0000 mg | Freq: Once | INTRAMUSCULAR | Status: AC
  Administered 2012-07-19: 25 mg via INTRAVENOUS

## 2012-07-19 MED ORDER — KETOROLAC TROMETHAMINE 60 MG/2ML IM SOLN
60.0000 mg | Freq: Once | INTRAMUSCULAR | Status: AC
  Administered 2012-07-19: 60 mg via INTRAMUSCULAR

## 2012-07-19 NOTE — Progress Notes (Signed)
  Subjective:    [name] is a 57 y.o. female who presents for evaluation of headache. Symptoms began about 3 days ago. Generally, the headaches last about 1 day and occur less than 1x a lmonth normally. The headaches do not seem to be related to any time of the year. The headaches are usually severe and are located in L side head.  The patient rates her most severe headaches a 8 on a scale from 1 to 10. Recently, the headaches have been increasing in frequency. Work attendance or other daily activities are not affected by the headaches. Precipitating factors include: none which have been determined. The headaches are usually preceded by an aura consisting of face flushing. Associated neurologic symptoms: decreased physical activity and vomiting in the early morning. The patient denies depression, dizziness, loss of balance, muscle weakness, numbness of extremities, speech difficulties and vision problems. Home treatment has included toradol injection and phenergan, darkening the room, resting and sleeping with marked improvement. Other history includes: nothing pertinent. Family history includes no known family members with significant headaches. Pt with hx brain tumors.  The following portions of the patient's history were reviewed and updated as appropriate: allergies, current medications, past family history, past medical history, past social history, past surgical history and problem list.  Review of Systems Pertinent items are noted in HPI.    Objective:    BP 120/90  Pulse 87  Temp(Src) 98.6 F (37 C) (Oral)  Wt 195 lb 9.6 oz (88.724 kg)  BMI 35.77 kg/m2  SpO2 97% General appearance: alert, cooperative, appears stated age and no distress Eyes: conjunctivae/corneas clear. PERRL, EOM's intact. Fundi benign. Ears: normal TM's and external ear canals both ears Nose: Nares normal. Septum midline. Mucosa normal. No drainage or sinus tenderness. Throat: abnormal findings: none Neck: no  adenopathy, supple, symmetrical, trachea midline and thyroid not enlarged, symmetric, no tenderness/mass/nodules Extremities: extremities normal, atraumatic, no cyanosis or edema Neurologic: Alert and oriented X 3, normal strength and tone. Normal symmetric reflexes. Normal coordination and gait    Assessment:    Classic migraine    Plan:    Lie in darkened room and apply cold packs as needed for pain. Side effect profile discussed in detail. Patient reassured that neurodiagnostic workup not indicated from benign H&P. toradol IM and phenergan Im given as done previously  Pt instructed to talk to her neurologist about her headaches

## 2012-07-19 NOTE — Patient Instructions (Signed)
Migraine Headache A migraine headache is an intense, throbbing pain on one or both sides of your head. A migraine can last for 30 minutes to several hours. CAUSES  The exact cause of a migraine headache is not always known. However, a migraine may be caused when nerves in the brain become irritated and release chemicals that cause inflammation. This causes pain. SYMPTOMS  Pain on one or both sides of your head.  Pulsating or throbbing pain.  Severe pain that prevents daily activities.  Pain that is aggravated by any physical activity.  Nausea, vomiting, or both.  Dizziness.  Pain with exposure to bright lights, loud noises, or activity.  General sensitivity to bright lights, loud noises, or smells. Before you get a migraine, you may get warning signs that a migraine is coming (aura). An aura may include:  Seeing flashing lights.  Seeing bright spots, halos, or zig-zag lines.  Having tunnel vision or blurred vision.  Having feelings of numbness or tingling.  Having trouble talking.  Having muscle weakness. MIGRAINE TRIGGERS  Alcohol.  Smoking.  Stress.  Menstruation.  Aged cheeses.  Foods or drinks that contain nitrates, glutamate, aspartame, or tyramine.  Lack of sleep.  Chocolate.  Caffeine.  Hunger.  Physical exertion.  Fatigue.  Medicines used to treat chest pain (nitroglycerine), birth control pills, estrogen, and some blood pressure medicines. DIAGNOSIS  A migraine headache is often diagnosed based on:  Symptoms.  Physical examination.  A CT scan or MRI of your head. TREATMENT Medicines may be given for pain and nausea. Medicines can also be given to help prevent recurrent migraines.  HOME CARE INSTRUCTIONS  Only take over-the-counter or prescription medicines for pain or discomfort as directed by your caregiver. The use of long-term narcotics is not recommended.  Lie down in a dark, quiet room when you have a migraine.  Keep a journal  to find out what may trigger your migraine headaches. For example, write down:  What you eat and drink.  How much sleep you get.  Any change to your diet or medicines.  Limit alcohol consumption.  Quit smoking if you smoke.  Get 7 to 9 hours of sleep, or as recommended by your caregiver.  Limit stress.  Keep lights dim if bright lights bother you and make your migraines worse. SEEK IMMEDIATE MEDICAL CARE IF:   Your migraine becomes severe.  You have a fever.  You have a stiff neck.  You have vision loss.  You have muscular weakness or loss of muscle control.  You start losing your balance or have trouble walking.  You feel faint or pass out.  You have severe symptoms that are different from your first symptoms. MAKE SURE YOU:   Understand these instructions.  Will watch your condition.  Will get help right away if you are not doing well or get worse. Document Released: 12/23/2004 Document Revised: 03/17/2011 Document Reviewed: 12/13/2010 [employer] Patient Information 2014 [employer].  

## 2012-08-09 ENCOUNTER — Other Ambulatory Visit: Payer: Self-pay | Admitting: Family Medicine

## 2012-08-11 NOTE — Telephone Encounter (Signed)
Rx sent to the pharmacy by e-script.//AB/CMA 

## 2012-08-17 ENCOUNTER — Other Ambulatory Visit: Payer: Self-pay | Admitting: Family Medicine

## 2012-08-30 ENCOUNTER — Ambulatory Visit: Payer: 59 | Admitting: Family Medicine

## 2012-09-17 ENCOUNTER — Ambulatory Visit: Payer: 59 | Admitting: Family Medicine

## 2012-09-21 ENCOUNTER — Telehealth: Payer: Self-pay | Admitting: Family Medicine

## 2012-09-21 NOTE — Telephone Encounter (Signed)
Patient is calling to request refill on Lyrica rx. Last filled on 06/21/12. She says that she called [facility] to send request but had not heard back. Did not see refill request. Patient does not have enough for this week left.  Please advise.

## 2012-09-22 MED ORDER — PREGABALIN 150 MG PO CAPS: 150.0000 mg | ORAL_CAPSULE | Freq: Two times a day (BID) | ORAL | Status: DC

## 2012-09-22 NOTE — Telephone Encounter (Signed)
Rx filled and faxed to [facility]. SW, CMA

## 2012-10-06 ENCOUNTER — Other Ambulatory Visit: Payer: Self-pay | Admitting: Family Medicine

## 2012-10-06 NOTE — Telephone Encounter (Signed)
Med filled. Pt needs follow up.  

## 2012-10-18 ENCOUNTER — Other Ambulatory Visit: Payer: Self-pay | Admitting: Family Medicine

## 2012-10-18 NOTE — Telephone Encounter (Signed)
Last OV 06-17-12 Med filled 06-17-12 #30 with 3 refills.

## 2012-11-01 ENCOUNTER — Other Ambulatory Visit: Payer: Self-pay | Admitting: Family Medicine

## 2012-11-01 NOTE — Telephone Encounter (Signed)
Med filled.  

## 2012-11-10 ENCOUNTER — Ambulatory Visit (INDEPENDENT_AMBULATORY_CARE_PROVIDER_SITE_OTHER): Payer: 59 | Admitting: Family Medicine

## 2012-11-10 ENCOUNTER — Encounter: Payer: Self-pay | Admitting: General Practice

## 2012-11-10 ENCOUNTER — Encounter: Payer: Self-pay | Admitting: Family Medicine

## 2012-11-10 VITALS — BP 128/84 | HR 87 | Temp 97.6°F | Resp 16 | Wt 202.2 lb

## 2012-11-10 DIAGNOSIS — Z23 Encounter for immunization: Secondary | ICD-10-CM

## 2012-11-10 DIAGNOSIS — C711 Malignant neoplasm of frontal lobe: Secondary | ICD-10-CM

## 2012-11-10 DIAGNOSIS — E785 Hyperlipidemia, unspecified: Secondary | ICD-10-CM

## 2012-11-10 LAB — LIPID PANEL
Cholesterol: 193 mg/dL (ref 0–200)
LDL Cholesterol: 105 mg/dL — ABNORMAL HIGH (ref 0–99)
Total CHOL/HDL Ratio: 3
Triglycerides: 145 mg/dL (ref 0.0–149.0)

## 2012-11-10 LAB — HEPATIC FUNCTION PANEL
ALT: 18 U/L (ref 0–35)
Albumin: 4.1 g/dL (ref 3.5–5.2)
Total Bilirubin: 0.3 mg/dL (ref 0.3–1.2)
Total Protein: 7.5 g/dL (ref 6.0–8.3)

## 2012-11-10 NOTE — Patient Instructions (Signed)
Schedule your complete physical for February We'll notify you of your lab results and make any changes if needed Call with any questions or concerns Happy Holidays!

## 2012-11-10 NOTE — Progress Notes (Signed)
  Subjective:    Patient ID: [name], female    DOB: [birth year], 57 y.o.   MRN: [id number]  HPI Hyperlipidemia- chronic problem, on Crestor and Fenofibrate.  No abd pain, N/V.  + myalgias due to fibro but not the meds.  Handicap Placard- pt asking for placard due to knee arthritis and hx of brain cancer w/ subsequent seizure disorder.   Review of Systems For ROS see HPI     Objective:   Physical Exam  Vitals reviewed. Constitutional: She is oriented to person, place, and time. She appears well-developed and well-nourished. No distress.  HENT:  Head: Normocephalic and atraumatic.  Eyes: Conjunctivae and EOM are normal. Pupils are equal, round, and reactive to light.  Neck: Normal range of motion. Neck supple. No thyromegaly present.  Cardiovascular: Normal rate, regular rhythm, normal heart sounds and intact distal pulses.   No murmur heard. Pulmonary/Chest: Effort normal and breath sounds normal. No respiratory distress.  Abdominal: Soft. She exhibits no distension. There is no tenderness.  Musculoskeletal: She exhibits no edema.  Lymphadenopathy:    She has no cervical adenopathy.  Neurological: She is alert and oriented to person, place, and time.  Skin: Skin is warm and dry.  Psychiatric: She has a normal mood and affect. Her behavior is normal.          Assessment & Plan:

## 2012-11-10 NOTE — Assessment & Plan Note (Signed)
Chronic problem.  Tolerating statin w/out difficulty.  Check labs.  Adjust meds prn  

## 2012-11-10 NOTE — Assessment & Plan Note (Signed)
Completed forms for pt to obtain handicapped placard.

## 2012-11-19 ENCOUNTER — Ambulatory Visit (INDEPENDENT_AMBULATORY_CARE_PROVIDER_SITE_OTHER): Payer: 59 | Admitting: Family Medicine

## 2012-11-19 ENCOUNTER — Encounter: Payer: Self-pay | Admitting: Family Medicine

## 2012-11-19 VITALS — BP 120/90 | HR 78 | Temp 97.9°F | Wt 201.6 lb

## 2012-11-19 DIAGNOSIS — G43909 Migraine, unspecified, not intractable, without status migrainosus: Secondary | ICD-10-CM

## 2012-11-19 MED ORDER — PROMETHAZINE HCL 25 MG PO TABS: 25.0000 mg | ORAL_TABLET | Freq: Three times a day (TID) | ORAL | Status: DC | PRN

## 2012-11-19 MED ORDER — TRAMADOL HCL 50 MG PO TABS: 50.0000 mg | ORAL_TABLET | Freq: Four times a day (QID) | ORAL | Status: DC | PRN

## 2012-11-19 MED ORDER — KETOROLAC TROMETHAMINE 60 MG/2ML IM SOLN
60.0000 mg | Freq: Once | INTRAMUSCULAR | Status: AC
  Administered 2012-11-19: 60 mg via INTRAMUSCULAR

## 2012-11-19 MED ORDER — PROMETHAZINE HCL 25 MG/ML IJ SOLN
25.0000 mg | Freq: Once | INTRAMUSCULAR | Status: AC
  Administered 2012-11-19: 25 mg via INTRAMUSCULAR

## 2012-11-19 NOTE — Progress Notes (Signed)
Pre visit review using our clinic review tool, if applicable. No additional management support is needed unless otherwise documented below in the visit note. 

## 2012-11-19 NOTE — Progress Notes (Signed)
  Subjective:    Patient ID: [name], female    DOB: 1955-04-15, 57 y.o.   MRN: [id number]  HPI Migraines- sxs started early this week.  Minimal nausea.  + phonophobia.  No photophobia.  HA is L frontal.  Feels similar to other migraines.  No vomiting.  No relief w/ Fiorinal.  Pt has taken triptans previously w/out relief.  Review of Systems For ROS see HPI     Objective:   Physical Exam  Vitals reviewed. Constitutional: She is oriented to person, place, and time. She appears well-developed and well-nourished. No distress.  HENT:  Head: Normocephalic and atraumatic.  TMs WNL No TTP over sinuses Minimal nasal congestion  Eyes: Conjunctivae and EOM are normal. Pupils are equal, round, and reactive to light.  Neck: Normal range of motion. Neck supple.  Cardiovascular: Normal rate, regular rhythm, normal heart sounds and intact distal pulses.   Pulmonary/Chest: Effort normal and breath sounds normal. No respiratory distress. She has no wheezes. She has no rales.  Lymphadenopathy:    She has no cervical adenopathy.  Neurological: She is alert and oriented to person, place, and time. She has normal reflexes. No cranial nerve deficit. Coordination normal.  Psychiatric: She has a normal mood and affect. Her behavior is normal. Judgment and thought content normal.          Assessment & Plan:

## 2012-11-19 NOTE — Assessment & Plan Note (Signed)
Chronic, recurrent problem.  Pt has seen neuro and neurosurg.  Typical tx is toradol and phenergan.  injxns given in office.  Reviewed supportive care and red flags that should prompt return.  Pt expressed understanding and is in agreement w/ plan.

## 2012-11-19 NOTE — Patient Instructions (Addendum)
Follow up as needed Use the phenergan as needed for nausea Use the Tramadol for pain Hang in there!

## 2012-12-15 ENCOUNTER — Other Ambulatory Visit: Payer: Self-pay | Admitting: Family Medicine

## 2012-12-16 NOTE — Telephone Encounter (Signed)
Rx sent to the pharmacy by e-script.//AB/CMA 

## 2013-01-03 ENCOUNTER — Other Ambulatory Visit: Payer: Self-pay | Admitting: Family Medicine

## 2013-01-03 NOTE — Telephone Encounter (Signed)
Med filled.  

## 2013-01-11 ENCOUNTER — Other Ambulatory Visit: Payer: Self-pay | Admitting: Family Medicine

## 2013-01-12 NOTE — Telephone Encounter (Signed)
Med filled.  

## 2013-02-02 ENCOUNTER — Other Ambulatory Visit: Payer: Self-pay | Admitting: Family Medicine

## 2013-02-02 NOTE — Telephone Encounter (Signed)
Med filled.  

## 2013-02-15 ENCOUNTER — Other Ambulatory Visit: Payer: Self-pay | Admitting: Family Medicine

## 2013-02-16 NOTE — Telephone Encounter (Signed)
Med filled.  

## 2013-02-17 ENCOUNTER — Encounter: Payer: Self-pay | Admitting: Family Medicine

## 2013-02-17 MED ORDER — PREGABALIN 150 MG PO CAPS: ORAL_CAPSULE | ORAL | Status: DC

## 2013-02-17 NOTE — Telephone Encounter (Signed)
Med filled.  

## 2013-02-23 ENCOUNTER — Other Ambulatory Visit: Payer: Self-pay | Admitting: Family Medicine

## 2013-02-23 NOTE — Telephone Encounter (Signed)
Last OV 11-19-12 Trazodone filled 10-18-12 #30 with 3

## 2013-02-26 ENCOUNTER — Encounter: Payer: Self-pay | Admitting: Family Medicine

## 2013-02-28 MED ORDER — TRAZODONE HCL 50 MG PO TABS: ORAL_TABLET | ORAL | Status: DC

## 2013-02-28 NOTE — Telephone Encounter (Signed)
Trazodone refill request Last ov 11-19-12 Med filled 10-18-12

## 2013-03-03 ENCOUNTER — Encounter (HOSPITAL_COMMUNITY): Payer: Self-pay | Admitting: Emergency Medicine

## 2013-03-03 ENCOUNTER — Emergency Department (HOSPITAL_COMMUNITY)
Admission: EM | Admit: 2013-03-03 | Discharge: 2013-03-03 | Disposition: A | Discharge status: Home / Self Care | Payer: 59 | Attending: Emergency Medicine | Admitting: Emergency Medicine

## 2013-03-03 DIAGNOSIS — I959 Hypotension, unspecified: Secondary | ICD-10-CM | POA: Insufficient documentation

## 2013-03-03 DIAGNOSIS — Z9889 Other specified postprocedural states: Secondary | ICD-10-CM | POA: Insufficient documentation

## 2013-03-03 DIAGNOSIS — R5383 Other fatigue: Secondary | ICD-10-CM

## 2013-03-03 DIAGNOSIS — G40909 Epilepsy, unspecified, not intractable, without status epilepticus: Secondary | ICD-10-CM | POA: Insufficient documentation

## 2013-03-03 DIAGNOSIS — IMO0001 Reserved for inherently not codable concepts without codable children: Secondary | ICD-10-CM | POA: Insufficient documentation

## 2013-03-03 DIAGNOSIS — Z880 Allergy status to penicillin: Secondary | ICD-10-CM | POA: Insufficient documentation

## 2013-03-03 DIAGNOSIS — Z791 Long term (current) use of non-steroidal anti-inflammatories (NSAID): Secondary | ICD-10-CM | POA: Insufficient documentation

## 2013-03-03 DIAGNOSIS — Z8781 Personal history of (healed) traumatic fracture: Secondary | ICD-10-CM | POA: Insufficient documentation

## 2013-03-03 DIAGNOSIS — Z79899 Other long term (current) drug therapy: Secondary | ICD-10-CM | POA: Insufficient documentation

## 2013-03-03 DIAGNOSIS — Z85841 Personal history of malignant neoplasm of brain: Secondary | ICD-10-CM | POA: Insufficient documentation

## 2013-03-03 DIAGNOSIS — G43909 Migraine, unspecified, not intractable, without status migrainosus: Secondary | ICD-10-CM | POA: Insufficient documentation

## 2013-03-03 DIAGNOSIS — R55 Syncope and collapse: Secondary | ICD-10-CM

## 2013-03-03 DIAGNOSIS — R42 Dizziness and giddiness: Secondary | ICD-10-CM | POA: Insufficient documentation

## 2013-03-03 DIAGNOSIS — R197 Diarrhea, unspecified: Secondary | ICD-10-CM | POA: Insufficient documentation

## 2013-03-03 DIAGNOSIS — R231 Pallor: Secondary | ICD-10-CM | POA: Insufficient documentation

## 2013-03-03 DIAGNOSIS — R5381 Other malaise: Secondary | ICD-10-CM | POA: Insufficient documentation

## 2013-03-03 LAB — CBC WITH DIFFERENTIAL/PLATELET
BASOS PCT: 0 % (ref 0–1)
Basophils Absolute: 0 10*3/uL (ref 0.0–0.1)
EOS ABS: 0.2 10*3/uL (ref 0.0–0.7)
Eosinophils Relative: 4 % (ref 0–5)
HCT: 35.2 % — ABNORMAL LOW (ref 36.0–46.0)
HEMOGLOBIN: 11.3 g/dL — AB (ref 12.0–15.0)
Lymphocytes Relative: 18 % (ref 12–46)
Lymphs Abs: 1.2 10*3/uL (ref 0.7–4.0)
MCH: 28.4 pg (ref 26.0–34.0)
MCHC: 32.1 g/dL (ref 30.0–36.0)
MCV: 88.4 fL (ref 78.0–100.0)
MONOS PCT: 10 % (ref 3–12)
Monocytes Absolute: 0.7 10*3/uL (ref 0.1–1.0)
NEUTROS PCT: 68 % (ref 43–77)
Neutro Abs: 4.5 10*3/uL (ref 1.7–7.7)
PLATELETS: 263 10*3/uL (ref 150–400)
RBC: 3.98 MIL/uL (ref 3.87–5.11)
RDW: 15.3 % (ref 11.5–15.5)
WBC: 6.6 10*3/uL (ref 4.0–10.5)

## 2013-03-03 LAB — URINALYSIS, ROUTINE W REFLEX MICROSCOPIC
Bilirubin Urine: NEGATIVE
GLUCOSE, UA: NEGATIVE mg/dL
Hgb urine dipstick: NEGATIVE
Ketones, ur: NEGATIVE mg/dL
Nitrite: POSITIVE — AB
Protein, ur: NEGATIVE mg/dL
SPECIFIC GRAVITY, URINE: 1.022 (ref 1.005–1.030)
Urobilinogen, UA: 0.2 mg/dL (ref 0.0–1.0)
pH: 6 (ref 5.0–8.0)

## 2013-03-03 LAB — COMPREHENSIVE METABOLIC PANEL
ALBUMIN: 3.8 g/dL (ref 3.5–5.2)
ALK PHOS: 58 U/L (ref 39–117)
ALT: 11 U/L (ref 0–35)
AST: 21 U/L (ref 0–37)
BUN: 17 mg/dL (ref 6–23)
CALCIUM: 9.6 mg/dL (ref 8.4–10.5)
CO2: 24 mEq/L (ref 19–32)
Chloride: 104 mEq/L (ref 96–112)
Creatinine, Ser: 1.13 mg/dL — ABNORMAL HIGH (ref 0.50–1.10)
GFR calc Af Amer: 62 mL/min — ABNORMAL LOW (ref >90)
GFR calc non Af Amer: 54 mL/min — ABNORMAL LOW (ref >90)
Glucose, Bld: 76 mg/dL (ref 70–99)
POTASSIUM: 4.4 meq/L (ref 3.7–5.3)
Sodium: 142 mEq/L (ref 137–147)
TOTAL PROTEIN: 7.7 g/dL (ref 6.0–8.3)
Total Bilirubin: 0.3 mg/dL (ref 0.3–1.2)

## 2013-03-03 LAB — TROPONIN I

## 2013-03-03 LAB — URINE MICROSCOPIC-ADD ON

## 2013-03-03 LAB — CLOSTRIDIUM DIFFICILE BY PCR: CDIFFPCR: NEGATIVE

## 2013-03-03 MED ORDER — LOPERAMIDE HCL 2 MG PO CAPS
4.0000 mg | ORAL_CAPSULE | ORAL | Status: DC | PRN
  Administered 2013-03-03: 4 mg via ORAL
  Filled 2013-03-03: qty 2

## 2013-03-03 MED ORDER — LOPERAMIDE HCL 2 MG PO CAPS: 4.0000 mg | ORAL_CAPSULE | ORAL | Status: DC | PRN

## 2013-03-03 MED ORDER — SODIUM CHLORIDE 0.9 % IV BOLUS (SEPSIS)
1000.0000 mL | Freq: Once | INTRAVENOUS | Status: AC
  Administered 2013-03-03: 1000 mL via INTRAVENOUS

## 2013-03-03 NOTE — ED Provider Notes (Signed)
CSN: [id number]     Arrival date & time 03/03/13  1132 History   First MD Initiated Contact with Patient 03/03/13 1133     Chief Complaint  Patient presents with  . Hypotension  . Diarrhea     (Consider location/radiation/quality/duration/timing/severity/associated sxs/prior Treatment) HPI Comments: Per husband now at bedside, she has had non-bloody diarrhea for the past one month. No fever or vomiting. He denies any recent antibiotics. She has recurrent episodes of weakness to the point where she cannot stand by herself and has passed out in the recent past. There was no full syncope today.   Patient is a 58 y.o. female presenting with fall. The history is provided by the patient. No language interpreter was used.  Fall Associated symptoms include weakness. Pertinent negatives include no abdominal pain, chills, fever or vomiting. Associated symptoms comments: She arrives from home where, per EMS report from husband, the patient became pre-syncopal while in the bathroom today. She has had diarrhea for the past several days with symptoms of profound weakness and near fainting today. No injury from fall - she was lowered to the ground. The patient denies vomiting. She cannot give details as to what happened this morning stating "I don't know why I'm here." She denies any pain currently..    Past Medical History  Diagnosis Date  . Brain cancer     Frontal lobe, 1993 and 2005  . Migraines   . Morton neuroma   . Seizures   . Migraine   . Fibromyalgia   . Right fibular fracture 07/12/2011  . 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 Appendectomy  1976  . Excision morton's neuroma      right foot  . Brain cancer      1993 and 2005  . Orif ankle fracture  07/12/2011    Procedure: OPEN REDUCTION INTERNAL FIXATION (ORIF) ANKLE FRACTURE;  Surgeon: Johnny Bridge, MD;   Location: [place];  Service: Orthopedics;  Laterality: Right;   Family History  Problem Relation Age of Onset  . Diabetes Mother   . Diabetes Brother   . Hypertension Mother   . Hypertension Father    History  Substance Use Topics  . Smoking status: Never Smoker   . Smokeless tobacco: Never Used  . Alcohol Use: No   OB History   Grav Para Term Preterm Abortions TAB SAB Ect Mult Living                 Review of Systems  Constitutional: Negative for fever and chills.  HENT: Negative.   Respiratory: Negative.   Cardiovascular: Negative.   Gastrointestinal: Positive for diarrhea. Negative for vomiting and abdominal pain.  Genitourinary: Negative for dysuria.  Musculoskeletal: Negative.   Skin: Negative.   Neurological: Positive for dizziness, weakness and light-headedness.      Allergies  Versed; Ciprofloxacin; Cyclobenzaprine hcl; Methocarbamol; Metronidazole; and Penicillins  Home Medications   Current Outpatient Rx  Name  Route  Sig  Dispense  Refill  . butalbital-aspirin-caffeine (FIORINAL) 50-325-40 MG per capsule   Oral   Take 1 capsule by mouth every 6 (six) hours as needed.          . Calcium Citrate-Vitamin D (CITRACAL + D PO)   Oral   Take 1 tablet by mouth daily. CA 1200mg -Vitamin D 1000iu         .  DULoxetine (CYMBALTA) 60 MG capsule      TAKE 1 CAPSULE BY MOUTH DAILY         . fenofibrate 160 MG tablet      TAKE 1 TABLET BY MOUTH DAILY         . fish oil-omega-3 fatty acids 1000 MG capsule   Oral   Take 1 g by mouth 2 (two) times daily.           Marland Kitchen levETIRAcetam (KEPPRA) 500 MG tablet   Oral   Take 1,500 mg by mouth 2 (two) times daily.          . meloxicam (MOBIC) 15 MG tablet      TAKE 1 TABLET BY MOUTH DAILY         . methocarbamol (ROBAXIN) 500 MG tablet   Oral   Take 500 mg by mouth every 8 (eight) hours as needed for muscle spasms.         Marland Kitchen omeprazole (PRILOSEC) 20 MG capsule      TAKE 1 CAPSULE BY MOUTH DAILY          . pregabalin (LYRICA) 150 MG capsule   Oral   Take 150 mg by mouth daily. TAKE ONE CAPSULE BY MOUTH TWICE DAILY         . promethazine (PHENERGAN) 25 MG tablet   Oral   Take 25 mg by mouth every 6 (six) hours as needed for nausea (for severe headache).         . rosuvastatin (CRESTOR) 10 MG tablet   Oral   Take 10 mg by mouth 3 (three) times a week. Monday, Wednesday, and friday         . traZODone (DESYREL) 50 MG tablet   Oral   Take 50 mg by mouth at bedtime. AS NEEDED FOR SLEEP         . verapamil (CALAN) 80 MG tablet   Oral   Take 80 mg by mouth 3 (three) times daily.          BP 108/77  Pulse 63  Temp(Src) 98 F (36.7 C) (Oral)  Resp 22  SpO2 98% Physical Exam  Constitutional: She is oriented to person, place, and time. She appears well-developed and well-nourished. No distress.  HENT:  Head: Normocephalic.  Neck: Normal range of motion. Neck supple.  Cardiovascular: Normal rate and regular rhythm.   Pulmonary/Chest: Effort normal and breath sounds normal.  Abdominal: Soft. Bowel sounds are normal. There is no tenderness. There is no rebound and no guarding.  Musculoskeletal: Normal range of motion.  Neurological: She is alert and oriented to person, place, and time. Coordination normal.  She is fully oriented to person, place and time, but cannot give details of recent illness.  Skin: Skin is warm and dry. No rash noted. There is pallor.  Psychiatric: She has a normal mood and affect.    ED Course  Procedures (including critical care time) Labs Review Labs Reviewed  CBC WITH DIFFERENTIAL  COMPREHENSIVE METABOLIC PANEL  URINALYSIS, ROUTINE W REFLEX MICROSCOPIC  TROPONIN I   Results for orders placed during the hospital encounter of 03/03/13  CLOSTRIDIUM DIFFICILE BY PCR      Result Value Ref Range   C difficile by pcr NEGATIVE  NEGATIVE  CBC WITH DIFFERENTIAL      Result Value Ref Range   WBC 6.6  4.0 - 10.5 K/uL   RBC 3.98  3.87 -  5.11 MIL/uL   Hemoglobin 11.3 (*) 12.0 -  15.0 g/dL   HCT 35.2 (*) 36.0 - 46.0 %   MCV 88.4  78.0 - 100.0 fL   MCH 28.4  26.0 - 34.0 pg   MCHC 32.1  30.0 - 36.0 g/dL   RDW 15.3  11.5 - 15.5 %   Platelets 263  150 - 400 K/uL   Neutrophils Relative % 68  43 - 77 %   Neutro Abs 4.5  1.7 - 7.7 K/uL   Lymphocytes Relative 18  12 - 46 %   Lymphs Abs 1.2  0.7 - 4.0 K/uL   Monocytes Relative 10  3 - 12 %   Monocytes Absolute 0.7  0.1 - 1.0 K/uL   Eosinophils Relative 4  0 - 5 %   Eosinophils Absolute 0.2  0.0 - 0.7 K/uL   Basophils Relative 0  0 - 1 %   Basophils Absolute 0.0  0.0 - 0.1 K/uL  COMPREHENSIVE METABOLIC PANEL      Result Value Ref Range   Sodium 142  137 - 147 mEq/L   Potassium 4.4  3.7 - 5.3 mEq/L   Chloride 104  96 - 112 mEq/L   CO2 24  19 - 32 mEq/L   Glucose, Bld 76  70 - 99 mg/dL   BUN 17  6 - 23 mg/dL   Creatinine, Ser 1.13 (*) 0.50 - 1.10 mg/dL   Calcium 9.6  8.4 - 10.5 mg/dL   Total Protein 7.7  6.0 - 8.3 g/dL   Albumin 3.8  3.5 - 5.2 g/dL   AST 21  0 - 37 U/L   ALT 11  0 - 35 U/L   Alkaline Phosphatase 58  39 - 117 U/L   Total Bilirubin 0.3  0.3 - 1.2 mg/dL   GFR calc non Af Amer 54 (*) >90 mL/min   GFR calc Af Amer 62 (*) >90 mL/min  URINALYSIS, ROUTINE W REFLEX MICROSCOPIC      Result Value Ref Range   Color, Urine YELLOW  YELLOW   APPearance CLEAR  CLEAR   Specific Gravity, Urine 1.022  1.005 - 1.030   pH 6.0  5.0 - 8.0   Glucose, UA NEGATIVE  NEGATIVE mg/dL   Hgb urine dipstick NEGATIVE  NEGATIVE   Bilirubin Urine NEGATIVE  NEGATIVE   Ketones, ur NEGATIVE  NEGATIVE mg/dL   Protein, ur NEGATIVE  NEGATIVE mg/dL   Urobilinogen, UA 0.2  0.0 - 1.0 mg/dL   Nitrite POSITIVE (*) NEGATIVE   Leukocytes, UA SMALL (*) NEGATIVE  TROPONIN I      Result Value Ref Range   Troponin I <0.30  <0.30 ng/mL  URINE MICROSCOPIC-ADD ON      Result Value Ref Range   Squamous Epithelial / LPF RARE  RARE   WBC, UA 3-6  <3 WBC/hpf   Bacteria, UA MANY (*) RARE   Casts  HYALINE CASTS (*) NEGATIVE    Imaging Review No results found.  EKG Interpretation   None       MDM   Final diagnoses:  None    1. Diarrhea 2. Hypotension  She is feeling much better after IV fluids. She ambulates well without lightheadedness or dizziness. No further diarrhea after treatment with Immodium. She seems more alert and focused mentally. Per husband, she appears significantly improved. Feel she is stable for discharge. They have community follow up in place.    Dewaine Oats, PA-C 03/04/13 1652

## 2013-03-03 NOTE — ED Notes (Signed)
RN gave patient warn blanket.

## 2013-03-03 NOTE — ED Notes (Signed)
Pt arrives via EMS from home with several day hx of diarrhea. This AM was in the bathroom feeling weak and had near syncopal episode. Pt was lower to floor. Husband called EMS. Pt hypotensive in transport. Pressures in 80s. 3 IV attempts unsucessful. Pt alert, oriented x4, pale, dehydrated appearance, denies pain.

## 2013-03-03 NOTE — ED Notes (Signed)
Amublated pt on the unit no complaint of dizziness, weakness, or lightheadedness states she felt like her normal self

## 2013-03-03 NOTE — Discharge Instructions (Signed)
Hypotension As your heart beats, it forces blood through your arteries. This force is your blood pressure. If your blood pressure is too low for you to go about your normal activities or to support the organs of your body, you have hypotension. Hypotension is also referred to as low blood pressure. When your blood pressure becomes too low, you may not get enough blood to your brain. As a result, you may feel weak, feel lightheaded, or develop a rapid heart rate. In a more severe case, you may faint. CAUSES Various conditions can cause hypotension. These include:  Blood loss.  Dehydration.  Heart or endocrine problems.  Pregnancy.  Severe infection.  Not having a well-balanced diet filled with needed nutrients.  Severe allergic reactions (anaphylaxis). Some medicines, such as blood pressure medicine or water pills (diuretics), may lower your blood pressure below normal. Sometimes taking too much medicine or taking medicine not as directed can cause hypotension. TREATMENT  Hospitalization is sometimes required for hypotension if fluid or blood replacement is needed, if time is needed for medicines to wear off, or if further monitoring is needed. Treatment might include changing your diet, changing your medicines (including medicines aimed at raising your blood pressure), and use of support stockings. HOME CARE INSTRUCTIONS   Drink enough fluids to keep your urine clear or pale yellow.  Take your medicines as directed by your health care provider.  Get up slowly from reclining or sitting positions. This gives your blood pressure a chance to adjust.  Wear support stockings as directed by your health care provider.  Maintain a healthy diet by including nutritious food, such as fruits, vegetables, nuts, whole grains, and lean meats. SEEK MEDICAL CARE IF:  You have vomiting or diarrhea.  You have a fever for more than 2 3 days.  You feel more thirsty than usual.  You feel weak and  tired. SEEK IMMEDIATE MEDICAL CARE IF:   You have chest pain or a fast or irregular heartbeat.  You have a loss of feeling in some part of your body, or you lose movement in your arms or legs.  You have trouble speaking.  You become sweaty or feel lightheaded.  You faint. MAKE SURE YOU:   Understand these instructions.  Will watch your condition.  Will get help right away if you are not doing well or get worse. Document Released: 12/23/2004 Document Revised: 10/13/2012 Document Reviewed: 06/25/2012 [facility] Patient Information 2014 [place]. Diarrhea Diarrhea is frequent loose and watery bowel movements. It can cause you to feel weak and dehydrated. Dehydration can cause you to become tired and thirsty, have a dry mouth, and have decreased urination that often is dark yellow. Diarrhea is a sign of another problem, most often an infection that will not last long. In most cases, diarrhea typically lasts 2 3 days. However, it can last longer if it is a sign of something more serious. It is important to treat your diarrhea as directed by your caregive to lessen or prevent future episodes of diarrhea. CAUSES  Some common causes include:  Gastrointestinal infections caused by viruses, bacteria, or parasites.  Food poisoning or food allergies.  Certain medicines, such as antibiotics, chemotherapy, and laxatives.  Artificial sweeteners and fructose.  Digestive disorders. HOME CARE INSTRUCTIONS  Ensure adequate fluid intake (hydration): have 1 cup (8 oz) of fluid for each diarrhea episode. Avoid fluids that contain simple sugars or sports drinks, fruit juices, whole milk products, and sodas. Your urine should be clear or pale  yellow if you are drinking enough fluids. Hydrate with an oral rehydration solution that you can purchase at pharmacies, retail stores, and online. You can prepare an oral rehydration solution at home by mixing the following ingredients together:    tsp  table salt.   tsp baking soda.   tsp salt substitute containing potassium chloride.  1  tablespoons sugar.  1 L (34 oz) of water.  Certain foods and beverages may increase the speed at which food moves through the gastrointestinal (GI) tract. These foods and beverages should be avoided and include:  Caffeinated and alcoholic beverages.  High-fiber foods, such as raw fruits and vegetables, nuts, seeds, and whole grain breads and cereals.  Foods and beverages sweetened with sugar alcohols, such as xylitol, sorbitol, and mannitol.  Some foods may be well tolerated and may help thicken stool including:  Starchy foods, such as rice, toast, pasta, low-sugar cereal, oatmeal, grits, baked potatoes, crackers, and bagels.  Bananas.  Applesauce.  Add probiotic-rich foods to help increase healthy bacteria in the GI tract, such as yogurt and fermented milk products.  Wash your hands well after each diarrhea episode.  Only take over-the-counter or prescription medicines as directed by your caregiver.  Take a warm bath to relieve any burning or pain from frequent diarrhea episodes. SEEK IMMEDIATE MEDICAL CARE IF:   You are unable to keep fluids down.  You have persistent vomiting.  You have blood in your stool, or your stools are black and tarry.  You do not urinate in 6 8 hours, or there is only a small amount of very dark urine.  You have abdominal pain that increases or localizes.  You have weakness, dizziness, confusion, or lightheadedness.  You have a severe headache.  Your diarrhea gets worse or does not get better.  You have a fever or persistent symptoms for more than 2 3 days.  You have a fever and your symptoms suddenly get worse. MAKE SURE YOU:   Understand these instructions.  Will watch your condition.  Will get help right away if you are not doing well or get worse. Document Released: 12/13/2001 Document Revised: 12/10/2011 Document Reviewed:  08/31/2011 [facility] Patient Information 2014 [place], [place].

## 2013-03-06 ENCOUNTER — Encounter (HOSPITAL_COMMUNITY): Payer: Self-pay | Admitting: Emergency Medicine

## 2013-03-06 ENCOUNTER — Inpatient Hospital Stay (HOSPITAL_COMMUNITY)
Admission: EM | Admit: 2013-03-06 | Discharge: 2013-03-08 | DRG: 690 | Disposition: A | Discharge status: Home Health | Payer: 59 | Attending: Internal Medicine | Admitting: Internal Medicine

## 2013-03-06 ENCOUNTER — Emergency Department (HOSPITAL_COMMUNITY): Payer: 59

## 2013-03-06 DIAGNOSIS — G4700 Insomnia, unspecified: Secondary | ICD-10-CM

## 2013-03-06 DIAGNOSIS — G43909 Migraine, unspecified, not intractable, without status migrainosus: Secondary | ICD-10-CM | POA: Diagnosis present

## 2013-03-06 DIAGNOSIS — J019 Acute sinusitis, unspecified: Secondary | ICD-10-CM

## 2013-03-06 DIAGNOSIS — R55 Syncope and collapse: Secondary | ICD-10-CM

## 2013-03-06 DIAGNOSIS — G40909 Epilepsy, unspecified, not intractable, without status epilepticus: Secondary | ICD-10-CM | POA: Diagnosis present

## 2013-03-06 DIAGNOSIS — M546 Pain in thoracic spine: Secondary | ICD-10-CM

## 2013-03-06 DIAGNOSIS — E860 Dehydration: Secondary | ICD-10-CM | POA: Diagnosis present

## 2013-03-06 DIAGNOSIS — R300 Dysuria: Secondary | ICD-10-CM

## 2013-03-06 DIAGNOSIS — D649 Anemia, unspecified: Secondary | ICD-10-CM

## 2013-03-06 DIAGNOSIS — C711 Malignant neoplasm of frontal lobe: Secondary | ICD-10-CM | POA: Diagnosis present

## 2013-03-06 DIAGNOSIS — Z833 Family history of diabetes mellitus: Secondary | ICD-10-CM

## 2013-03-06 DIAGNOSIS — R29898 Other symptoms and signs involving the musculoskeletal system: Secondary | ICD-10-CM | POA: Diagnosis present

## 2013-03-06 DIAGNOSIS — Z0000 Encounter for general adult medical examination without abnormal findings: Secondary | ICD-10-CM

## 2013-03-06 DIAGNOSIS — N898 Other specified noninflammatory disorders of vagina: Secondary | ICD-10-CM

## 2013-03-06 DIAGNOSIS — Z8249 Family history of ischemic heart disease and other diseases of the circulatory system: Secondary | ICD-10-CM

## 2013-03-06 DIAGNOSIS — Z124 Encounter for screening for malignant neoplasm of cervix: Secondary | ICD-10-CM

## 2013-03-06 DIAGNOSIS — S82401A Unspecified fracture of shaft of right fibula, initial encounter for closed fracture: Secondary | ICD-10-CM

## 2013-03-06 DIAGNOSIS — Z888 Allergy status to other drugs, medicaments and biological substances status: Secondary | ICD-10-CM

## 2013-03-06 DIAGNOSIS — R269 Unspecified abnormalities of gait and mobility: Secondary | ICD-10-CM | POA: Diagnosis present

## 2013-03-06 DIAGNOSIS — R197 Diarrhea, unspecified: Secondary | ICD-10-CM | POA: Diagnosis present

## 2013-03-06 DIAGNOSIS — R252 Cramp and spasm: Secondary | ICD-10-CM

## 2013-03-06 DIAGNOSIS — Z880 Allergy status to penicillin: Secondary | ICD-10-CM

## 2013-03-06 DIAGNOSIS — E785 Hyperlipidemia, unspecified: Secondary | ICD-10-CM | POA: Diagnosis present

## 2013-03-06 DIAGNOSIS — Z9181 History of falling: Secondary | ICD-10-CM

## 2013-03-06 DIAGNOSIS — F3289 Other specified depressive episodes: Secondary | ICD-10-CM | POA: Diagnosis present

## 2013-03-06 DIAGNOSIS — R531 Weakness: Secondary | ICD-10-CM | POA: Diagnosis present

## 2013-03-06 DIAGNOSIS — M6281 Muscle weakness (generalized): Secondary | ICD-10-CM

## 2013-03-06 DIAGNOSIS — I959 Hypotension, unspecified: Secondary | ICD-10-CM | POA: Diagnosis present

## 2013-03-06 DIAGNOSIS — R5381 Other malaise: Secondary | ICD-10-CM

## 2013-03-06 DIAGNOSIS — M797 Fibromyalgia: Secondary | ICD-10-CM

## 2013-03-06 DIAGNOSIS — IMO0001 Reserved for inherently not codable concepts without codable children: Secondary | ICD-10-CM | POA: Diagnosis present

## 2013-03-06 DIAGNOSIS — F329 Major depressive disorder, single episode, unspecified: Secondary | ICD-10-CM

## 2013-03-06 DIAGNOSIS — Z881 Allergy status to other antibiotic agents status: Secondary | ICD-10-CM

## 2013-03-06 DIAGNOSIS — D638 Anemia in other chronic diseases classified elsewhere: Secondary | ICD-10-CM | POA: Diagnosis present

## 2013-03-06 DIAGNOSIS — J309 Allergic rhinitis, unspecified: Secondary | ICD-10-CM

## 2013-03-06 DIAGNOSIS — B369 Superficial mycosis, unspecified: Secondary | ICD-10-CM

## 2013-03-06 DIAGNOSIS — N390 Urinary tract infection, site not specified: Principal | ICD-10-CM | POA: Diagnosis present

## 2013-03-06 DIAGNOSIS — R5383 Other fatigue: Secondary | ICD-10-CM

## 2013-03-06 DIAGNOSIS — E669 Obesity, unspecified: Secondary | ICD-10-CM

## 2013-03-06 HISTORY — DX: Anemia, unspecified: D64.9

## 2013-03-06 LAB — URINALYSIS, ROUTINE W REFLEX MICROSCOPIC
Bilirubin Urine: NEGATIVE
GLUCOSE, UA: NEGATIVE mg/dL
HGB URINE DIPSTICK: NEGATIVE
KETONES UR: NEGATIVE mg/dL
Nitrite: POSITIVE — AB
Protein, ur: NEGATIVE mg/dL
Specific Gravity, Urine: 1.017 (ref 1.005–1.030)
UROBILINOGEN UA: 0.2 mg/dL (ref 0.0–1.0)
pH: 5.5 (ref 5.0–8.0)

## 2013-03-06 LAB — TROPONIN I
Troponin I: <0.3 ng/mL (ref <0.30)
Troponin I: <0.3 ng/mL (ref <0.30)

## 2013-03-06 LAB — COMPREHENSIVE METABOLIC PANEL
ALT: 10 U/L (ref 0–35)
AST: 20 U/L (ref 0–37)
Albumin: 3.2 g/dL — ABNORMAL LOW (ref 3.5–5.2)
Alkaline Phosphatase: 44 U/L (ref 39–117)
BILIRUBIN TOTAL: 0.3 mg/dL (ref 0.3–1.2)
BUN: 12 mg/dL (ref 6–23)
CALCIUM: 9.1 mg/dL (ref 8.4–10.5)
CHLORIDE: 106 meq/L (ref 96–112)
CO2: 21 meq/L (ref 19–32)
Creatinine, Ser: 0.98 mg/dL (ref 0.50–1.10)
GFR, EST AFRICAN AMERICAN: 74 mL/min — AB (ref >90)
GFR, EST NON AFRICAN AMERICAN: 64 mL/min — AB (ref >90)
GLUCOSE: 87 mg/dL (ref 70–99)
Potassium: 4.3 mEq/L (ref 3.7–5.3)
SODIUM: 142 meq/L (ref 137–147)
Total Protein: 6.8 g/dL (ref 6.0–8.3)

## 2013-03-06 LAB — CREATININE, SERUM
Creatinine, Ser: 0.96 mg/dL (ref 0.50–1.10)
GFR calc Af Amer: 76 mL/min — ABNORMAL LOW (ref >90)
GFR calc non Af Amer: 65 mL/min — ABNORMAL LOW (ref >90)

## 2013-03-06 LAB — CBC
HCT: 28.7 % — ABNORMAL LOW (ref 36.0–46.0)
Hemoglobin: 9.4 g/dL — ABNORMAL LOW (ref 12.0–15.0)
MCH: 28.8 pg (ref 26.0–34.0)
MCHC: 32.8 g/dL (ref 30.0–36.0)
MCV: 88 fL (ref 78.0–100.0)
Platelets: 207 10*3/uL (ref 150–400)
RBC: 3.26 MIL/uL — ABNORMAL LOW (ref 3.87–5.11)
RDW: 15.3 % (ref 11.5–15.5)
WBC: 2.9 10*3/uL — ABNORMAL LOW (ref 4.0–10.5)

## 2013-03-06 LAB — PROTIME-INR
INR: 1.15 (ref 0.00–1.49)
Prothrombin Time: 14.5 seconds (ref 11.6–15.2)

## 2013-03-06 LAB — CBC WITH DIFFERENTIAL/PLATELET
Basophils Absolute: 0 10*3/uL (ref 0.0–0.1)
Basophils Relative: 0 % (ref 0–1)
Eosinophils Absolute: 0.2 10*3/uL (ref 0.0–0.7)
Eosinophils Relative: 4 % (ref 0–5)
HEMATOCRIT: 33 % — AB (ref 36.0–46.0)
Hemoglobin: 10.6 g/dL — ABNORMAL LOW (ref 12.0–15.0)
LYMPHS ABS: 1 10*3/uL (ref 0.7–4.0)
LYMPHS PCT: 25 % (ref 12–46)
MCH: 28.6 pg (ref 26.0–34.0)
MCHC: 32.1 g/dL (ref 30.0–36.0)
MCV: 88.9 fL (ref 78.0–100.0)
Monocytes Absolute: 0.4 10*3/uL (ref 0.1–1.0)
Monocytes Relative: 11 % (ref 3–12)
NEUTROS ABS: 2.4 10*3/uL (ref 1.7–7.7)
Neutrophils Relative %: 60 % (ref 43–77)
Platelets: 216 10*3/uL (ref 150–400)
RBC: 3.71 MIL/uL — AB (ref 3.87–5.11)
RDW: 15.3 % (ref 11.5–15.5)
WBC: 4 10*3/uL (ref 4.0–10.5)

## 2013-03-06 LAB — MAGNESIUM: Magnesium: 1.8 mg/dL (ref 1.5–2.5)

## 2013-03-06 LAB — URINE MICROSCOPIC-ADD ON

## 2013-03-06 LAB — APTT: aPTT: 31 seconds (ref 24–37)

## 2013-03-06 LAB — I-STAT CG4 LACTIC ACID, ED: Lactic Acid, Venous: 1.37 mmol/L (ref 0.5–2.2)

## 2013-03-06 MED ORDER — LEVETIRACETAM 500 MG PO TABS
1000.0000 mg | ORAL_TABLET | Freq: Once | ORAL | Status: AC
  Administered 2013-03-06: 1000 mg via ORAL
  Filled 2013-03-06: qty 2

## 2013-03-06 MED ORDER — ACETAMINOPHEN 325 MG PO TABS: 650.0000 mg | ORAL_TABLET | Freq: Four times a day (QID) | ORAL | Status: DC | PRN

## 2013-03-06 MED ORDER — IPRATROPIUM BROMIDE 0.02 % IN SOLN: 0.5000 mg | RESPIRATORY_TRACT | Status: DC | PRN

## 2013-03-06 MED ORDER — OMEGA-3 FATTY ACIDS 1000 MG PO CAPS: 1.0000 g | ORAL_CAPSULE | Freq: Two times a day (BID) | ORAL | Status: DC

## 2013-03-06 MED ORDER — LEVETIRACETAM 750 MG PO TABS
1500.0000 mg | ORAL_TABLET | Freq: Two times a day (BID) | ORAL | Status: DC
  Administered 2013-03-07 – 2013-03-08 (×3): 1500 mg via ORAL
  Filled 2013-03-06 (×4): qty 2

## 2013-03-06 MED ORDER — POLYETHYLENE GLYCOL 3350 17 G PO PACK
17.0000 g | PACK | Freq: Every day | ORAL | Status: DC | PRN
  Filled 2013-03-06: qty 1

## 2013-03-06 MED ORDER — MAGNESIUM CITRATE PO SOLN
1.0000 | Freq: Once | ORAL | Status: AC | PRN
  Filled 2013-03-06: qty 296

## 2013-03-06 MED ORDER — PREGABALIN 75 MG PO CAPS
150.0000 mg | ORAL_CAPSULE | Freq: Two times a day (BID) | ORAL | Status: DC
  Administered 2013-03-06 – 2013-03-08 (×4): 150 mg via ORAL
  Filled 2013-03-06 (×4): qty 2

## 2013-03-06 MED ORDER — METHOCARBAMOL 500 MG PO TABS
500.0000 mg | ORAL_TABLET | Freq: Three times a day (TID) | ORAL | Status: DC | PRN
  Filled 2013-03-06: qty 1

## 2013-03-06 MED ORDER — SODIUM CHLORIDE 0.9 % IV SOLN
INTRAVENOUS | Status: AC
  Administered 2013-03-06: 17:00:00 via INTRAVENOUS
  Filled 2013-03-06: qty 1000

## 2013-03-06 MED ORDER — LOPERAMIDE HCL 2 MG PO CAPS: 4.0000 mg | ORAL_CAPSULE | ORAL | Status: DC | PRN

## 2013-03-06 MED ORDER — ALBUTEROL SULFATE (2.5 MG/3ML) 0.083% IN NEBU: 2.5000 mg | INHALATION_SOLUTION | RESPIRATORY_TRACT | Status: DC | PRN

## 2013-03-06 MED ORDER — SODIUM CHLORIDE 0.9 % IV BOLUS (SEPSIS)
1000.0000 mL | Freq: Once | INTRAVENOUS | Status: AC
  Administered 2013-03-06: 1000 mL via INTRAVENOUS

## 2013-03-06 MED ORDER — LEVETIRACETAM 500 MG PO TABS
500.0000 mg | ORAL_TABLET | Freq: Once | ORAL | Status: AC
  Administered 2013-03-06: 500 mg via ORAL
  Filled 2013-03-06: qty 1

## 2013-03-06 MED ORDER — ACETAMINOPHEN 650 MG RE SUPP: 650.0000 mg | Freq: Four times a day (QID) | RECTAL | Status: DC | PRN

## 2013-03-06 MED ORDER — SODIUM CHLORIDE 0.9 % IJ SOLN
3.0000 mL | Freq: Two times a day (BID) | INTRAMUSCULAR | Status: DC
  Administered 2013-03-07 – 2013-03-08 (×3): 3 mL via INTRAVENOUS

## 2013-03-06 MED ORDER — OMEGA-3-ACID ETHYL ESTERS 1 G PO CAPS
1.0000 g | ORAL_CAPSULE | Freq: Two times a day (BID) | ORAL | Status: DC
  Administered 2013-03-06 – 2013-03-08 (×4): 1 g via ORAL
  Filled 2013-03-06 (×5): qty 1

## 2013-03-06 MED ORDER — ALUM & MAG HYDROXIDE-SIMETH 200-200-20 MG/5ML PO SUSP: 30.0000 mL | Freq: Four times a day (QID) | ORAL | Status: DC | PRN

## 2013-03-06 MED ORDER — CEFTRIAXONE SODIUM 1 G IJ SOLR
1.0000 g | INTRAMUSCULAR | Status: DC
  Administered 2013-03-07 – 2013-03-08 (×2): 1 g via INTRAVENOUS
  Filled 2013-03-06 (×2): qty 10

## 2013-03-06 MED ORDER — SODIUM CHLORIDE 0.9 % IV SOLN
INTRAVENOUS | Status: DC
  Filled 2013-03-06: qty 1000

## 2013-03-06 MED ORDER — SORBITOL 70 % SOLN
30.0000 mL | Freq: Every day | Status: DC | PRN
  Filled 2013-03-06: qty 30

## 2013-03-06 MED ORDER — ENOXAPARIN SODIUM 40 MG/0.4ML ~~LOC~~ SOLN
40.0000 mg | SUBCUTANEOUS | Status: DC
  Administered 2013-03-06 – 2013-03-07 (×2): 40 mg via SUBCUTANEOUS
  Filled 2013-03-06 (×3): qty 0.4

## 2013-03-06 MED ORDER — TRAZODONE HCL 50 MG PO TABS
50.0000 mg | ORAL_TABLET | Freq: Every evening | ORAL | Status: DC | PRN
  Filled 2013-03-06: qty 1

## 2013-03-06 MED ORDER — PROMETHAZINE HCL 25 MG PO TABS
25.0000 mg | ORAL_TABLET | Freq: Four times a day (QID) | ORAL | Status: DC | PRN
  Filled 2013-03-06: qty 1

## 2013-03-06 MED ORDER — DEXTROSE 5 % IV SOLN
1.0000 g | Freq: Once | INTRAVENOUS | Status: AC
  Administered 2013-03-06: 1 g via INTRAVENOUS
  Filled 2013-03-06: qty 10

## 2013-03-06 MED ORDER — DULOXETINE HCL 60 MG PO CPEP
60.0000 mg | ORAL_CAPSULE | Freq: Every day | ORAL | Status: DC
  Administered 2013-03-06 – 2013-03-08 (×3): 60 mg via ORAL
  Filled 2013-03-06 (×3): qty 1

## 2013-03-06 MED ORDER — MELOXICAM 15 MG PO TABS
15.0000 mg | ORAL_TABLET | Freq: Every day | ORAL | Status: DC
  Administered 2013-03-06 – 2013-03-07 (×2): 15 mg via ORAL
  Filled 2013-03-06 (×4): qty 1

## 2013-03-06 MED ORDER — PANTOPRAZOLE SODIUM 40 MG PO TBEC
40.0000 mg | DELAYED_RELEASE_TABLET | Freq: Every day | ORAL | Status: DC
  Administered 2013-03-07 – 2013-03-08 (×2): 40 mg via ORAL
  Filled 2013-03-06 (×2): qty 1

## 2013-03-06 MED ORDER — FENOFIBRATE 160 MG PO TABS
160.0000 mg | ORAL_TABLET | Freq: Every day | ORAL | Status: DC
  Administered 2013-03-06 – 2013-03-07 (×2): 160 mg via ORAL
  Filled 2013-03-06 (×4): qty 1

## 2013-03-06 MED ORDER — PROMETHAZINE HCL 25 MG PO TABS: 25.0000 mg | ORAL_TABLET | Freq: Four times a day (QID) | ORAL | Status: DC | PRN

## 2013-03-06 MED ORDER — OXYCODONE HCL 5 MG PO TABS: 5.0000 mg | ORAL_TABLET | ORAL | Status: DC | PRN

## 2013-03-06 NOTE — ED Notes (Signed)
Pt was placed on bedpan and unable to give urine sample at this time

## 2013-03-06 NOTE — ED Notes (Signed)
Lactic acid results 1.37 mmol/L given to Dr. Regenia Skeeter 11:41

## 2013-03-06 NOTE — ED Notes (Signed)
md at bedside to eval pt

## 2013-03-06 NOTE — Progress Notes (Signed)
03/06/13 Patient coming from Emergency Room to Room 5 W 25 with Dx of UTI,Fatigue. And Weakness. Lives home with family. Has hx of CA, Migraines,fibramyolgia and Seizures,IV site in Left hand, 02/ 2 L,Currently NPO.

## 2013-03-06 NOTE — ED Notes (Signed)
Pt arrived by [facility]. Woke up with generalized fatigue and weakness, was unable get up to bedside commode due to weakness. cbg 70 pta, bp 94/58.

## 2013-03-06 NOTE — ED Notes (Signed)
Admitting md at bedside

## 2013-03-06 NOTE — ED Notes (Signed)
Neurologist at bedside to eval pt

## 2013-03-06 NOTE — H&P (Addendum)
[facility] History and Physical  [name] QVZ:[id number] DOB: [birth year] DOA: 03/06/2013  Referring physician: Dr. Verner Chol PCP: Annye Asa, MD   Patient's neurologist: Dr. Joesph July of the [facility]/headache M.D.: Dr. Rock Nephew  Chief Complaint: Weakness  HPI: [name] is a 58 y.o. 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 History of seizures, recurrence of brain tumor in 2005 status post resection, history of migraines, seizures, fibromyalgia who presents to the ED with several months of generalized lower extremity weakness since October/November of 2014, 3 week history of intermittent diarrhea and dragging of the right there greater than the left. Patient denies any tingling, no numbness, no fever, no chills, no cough, no melena, no hematemesis, no hematochezia, no dysuria, no cough, no emesis, no chills. Patient endorses falls. Per family patient also with complaints of right greater than left lower extremity weakness with dragging of the right leg. Patient denies any numbness, no tingling. Patient's husband states that couldn't get the patient out of bed this morning secondary to significant weakness. EMS was subsequently called. Per husband when EMS got the patient's pressures were in the 80s and patient was given a fluid bolus. Her blood pressures improved with IVF ED physician. Patient stated that she had similar episodes recentlty which transiently resolved. Patient husband states that patient [name] from the weakness has been progressive and worsened over the past 3 weeks with some associated falls and some incontinence. In the ED patient initially noted soft systolic blood pressure in the low 80s. Chest x-ray which was done was negative for any acute abnormalities. Patient's blood pressure responded to IV fluids. Compressive metabolic profile was unremarkable. Troponin was negative. Lactic acid  level was 1.37. CBC had a hemoglobin of 10.6 otherwise was within normal limits. Urinalysis which was done was consistent with a UTI. Patient was placed empirically on IV Rocephin. Will consult with a minute the patient for further evaluation and management. Per ED physician neurology was also consulted on the patient.    Review of Systems: Per history of present illness otherwise negative. Constitutional:  No weight loss, night sweats, Fevers, chills, fatigue.  HEENT:  No headaches, Difficulty swallowing,Tooth/dental problems,Sore throat,  No sneezing, itching, ear ache, nasal congestion, post nasal drip,  Cardio-vascular:  No chest pain, Orthopnea, PND, swelling in lower extremities, anasarca, dizziness, palpitations  GI:  No heartburn, indigestion, abdominal pain, nausea, vomiting, diarrhea, change in bowel habits, loss of appetite  Resp:  No shortness of breath with exertion or at rest. No excess mucus, no productive cough, No non-productive cough, No coughing up of blood.No change in color of mucus.No wheezing.No chest wall deformity  Skin:  no rash or lesions.  GU:  no dysuria, change in color of urine, no urgency or frequency. No flank pain.  Musculoskeletal:  No joint pain or swelling. No decreased range of motion. No back pain.  Psych:  No change in mood or affect. No depression or anxiety. No memory loss.   Past Medical History  Diagnosis Date  . Brain cancer     Frontal lobe, 1993 and 2005  . Migraines   . Morton neuroma   . Seizures   . Migraine   . Fibromyalgia   . Right fibular fracture 07/12/2011  . 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 Anemia 03/06/2013  Past Surgical History  Procedure Laterality Date  . Appendectomy  1976  . Excision morton's neuroma      right foot  . Brain cancer      1993 and 2005  . Orif ankle fracture  07/12/2011    Procedure: OPEN REDUCTION INTERNAL FIXATION  (ORIF) ANKLE FRACTURE;  Surgeon: Johnny Bridge, MD;  Location: [place];  Service: Orthopedics;  Laterality: Right;   Social History:  reports that she has never smoked. She has never used smokeless tobacco. She reports that she does not drink alcohol or use illicit drugs.  Allergies  Allergen Reactions  . Versed [Midazolam] Other (See Comments)    Severe respiratory depression, had to be masked in preop holding.  . Ciprofloxacin     REACTION: NAUSEA AND VOMITING  . Cyclobenzaprine Hcl   . Methocarbamol     REACTION: itching Patient currently tolerating it as its on her home med list.  . Metronidazole   . Penicillins     REACTION: RASH    Family History  Problem Relation Age of Onset  . Diabetes Mother   . Diabetes Brother   . Hypertension Mother   . Hypertension Father      Prior to Admission medications   Medication Sig Start Date End Date Taking? Authorizing Provider  Calcium Citrate-Vitamin D (CITRACAL + D PO) Take 1 tablet by mouth daily. CA 1200mg -Vitamin D 1000iu   Yes Historical Provider, MD  Cholecalciferol (VITAMIN D-3 PO) Take 1 tablet by mouth daily.   Yes Historical Provider, MD  DULoxetine (CYMBALTA) 60 MG capsule Take 60 mg by mouth daily.   Yes Historical Provider, MD  fenofibrate 160 MG tablet Take 160 mg by mouth daily.   Yes Historical Provider, MD  fish oil-omega-3 fatty acids 1000 MG capsule Take 1 g by mouth 2 (two) times daily.     Yes Historical Provider, MD  levETIRAcetam (KEPPRA) 500 MG tablet Take 1,500 mg by mouth 2 (two) times daily.    Yes Historical Provider, MD  loperamide (IMODIUM) 2 MG capsule Take 2 capsules (4 mg total) by mouth as needed for diarrhea or loose stools. Maximum 16 mg daily 03/03/13  Yes Shari A Upstill, PA-C  meloxicam (MOBIC) 15 MG tablet Take 15 mg by mouth daily.   Yes Historical Provider, MD  methocarbamol (ROBAXIN) 500 MG tablet Take 500 mg by mouth every 8 (eight) hours as needed for muscle spasms.   Yes Historical Provider,  MD  omeprazole (PRILOSEC) 20 MG capsule Take 20 mg by mouth daily.   Yes Historical Provider, MD  pregabalin (LYRICA) 150 MG capsule Take 150 mg by mouth 2 (two) times daily.  02/17/13  Yes Midge Minium, MD  promethazine (PHENERGAN) 25 MG tablet Take 25 mg by mouth every 6 (six) hours as needed for nausea (for severe headache). 11/19/12  Yes Midge Minium, MD  rosuvastatin (CRESTOR) 10 MG tablet Take 10 mg by mouth 3 (three) times a week. Monday, Wednesday, and friday   Yes Historical Provider, MD  traZODone (DESYREL) 50 MG tablet Take 50 mg by mouth at bedtime as needed for sleep.  02/28/13  Yes Midge Minium, MD  verapamil (CALAN) 80 MG tablet Take 80 mg by mouth 3 (three) times daily.   Yes Historical Provider, MD   Physical Exam: Filed Vitals:   03/06/13 1530  BP: 111/52  Pulse: 68  Temp:   Resp: 15    BP 111/52  Pulse 68  Temp(Src) 99.2 F (  37.3 C) (Oral)  Resp 15  Ht 5\' 2"  (1.575 m)  Wt 92.987 kg (205 lb)  BMI 37.49 kg/m2  SpO2 98%  General:  Obese, Appears calm and comfortable in no acute cardiopulmonary distress. Eyes: PERRLA, EOMI, normal lids, irises & conjunctiva. Dry mucous membranes. ENT: grossly normal hearing, lips & tongue Neck: no LAD, masses or thyromegaly Cardiovascular: RRR, no m/r/g. No LE edema. Telemetry: SR, no arrhythmias  Respiratory: CTA bilaterally, no w/r/r. Normal respiratory effort. Abdomen: soft, ntnd, positive bowel sounds Skin: no rash or induration seen on limited exam Musculoskeletal: grossly normal tone BUE/BLE Psychiatric: grossly normal mood and affect, speech fluent and appropriate Neurologic: Alert and oriented x3. Cranial nerves II through XII are grossly intact. Visual fields are intact. Sensation is intact. Gait not tested secondary to safety.           Labs on Admission:  Basic Metabolic Panel:  Recent Labs Lab 03/03/13 1226 03/06/13 1129  NA 142 142  K 4.4 4.3  CL 104 106  CO2 24 21  GLUCOSE 76 87  BUN  17 12  CREATININE 1.13* 0.98  CALCIUM 9.6 9.1   Liver Function Tests:  Recent Labs Lab 03/03/13 1226 03/06/13 1129  AST 21 20  ALT 11 10  ALKPHOS 58 44  BILITOT 0.3 0.3  PROT 7.7 6.8  ALBUMIN 3.8 3.2*   No results found for this basename: LIPASE, AMYLASE,  in the last 168 hours No results found for this basename: AMMONIA,  in the last 168 hours CBC:  Recent Labs Lab 03/03/13 1226 03/06/13 1129  WBC 6.6 4.0  NEUTROABS 4.5 2.4  HGB 11.3* 10.6*  HCT 35.2* 33.0*  MCV 88.4 88.9  PLT 263 216   Cardiac Enzymes:  Recent Labs Lab 03/03/13 1226  TROPONINI <0.30    BNP (last 3 results) No results found for this basename: PROBNP,  in the last 8760 hours CBG: No results found for this basename: GLUCAP,  in the last 168 hours  Radiological Exams on Admission: Dg Chest Portable 1 View  03/06/2013   CLINICAL DATA:  Fatigue  EXAM: PORTABLE CHEST - 1 VIEW  COMPARISON:  None available  FINDINGS: Lungs are clear. Heart size and mediastinal contours are within normal limits. No effusion. Visualized skeletal structures are unremarkable.  IMPRESSION: No acute cardiopulmonary disease.   Electronically Signed   By: Arne Cleveland M.D.   On: 03/06/2013 11:07    EKG: Independently reviewed. NSR, LVH  Assessment/Plan Principal Problem:   Weakness of both legs Active Problems:   Malignant neoplasm of frontal lobe of brain   HYPERLIPIDEMIA   DEPRESSIVE DISORDER   Migraine   UTI (urinary tract infection)   Hypotension   UTI (lower urinary tract infection)   Anemia   Weakness  #1 weakness Questionable etiology. May be secondary to hypotension as patient was noted to have systolic blood pressures in the 80s on arrival to the ED in the setting of urinary tract infection versus neurological etiology. The patient's husband states patient has been experiencing progressive lower extremity weakness with gait impairment that has worsened over the past 3 weeks with some associated falls and  bladder incontinence. Patient's blood pressures responded to IV fluids. Will panculture. Placed empirically on IV Rocephin for UTI. Check a random cortisol level. Check a TSH. IV fluids. PT/OT. May need MRI of the head to rule out acute CVA versus MRI of the spine. Neurology has been consulted and will be seeing the patient. Follow.  #2  urinary tract infection Urine cultures are pending. Place on IV Rocephin.  #3 hypotension Questionable etiology. Patient has had some intermittent diarrhea may be secondary to hypovolemia as blood pressure responded to IV fluids versus infectious etiology is urinalysis consistent with a UTI. Patient also verapamil for migraine prophylaxis. Will discontinue verapamil. Chest x-ray was negative. Urine cultures pending. Patient with no overt bleeding. WIll check a random cortisol to rule out adrenal insufficiency. Place on IV fluids. IV Rocephin. Follow.  #4 anemia Likely anemia of chronic disease. Check an anemia panel. Follow.  #5 hyperlipidemia Continue fish oil.  #6 history of seizures  Resume home regimen of Keppra.  #7 history of malignant neoplasm of the brain status post resection x2  stable. Patient with recent MRI done at [facility] in December of 2014 which was negative for any acute abnormalities. Will follow for now.  #8 migraines Stable. Will discontinue verapamil which was all for seizure prophylaxis secondary to problem #1 and 3.  #9 prophylaxis PPI for GI prophylaxis. Lovenox for DVT prophylaxis.  Code Status: Full Family Communication: Updated patient, husband, parents. Disposition Plan: Admit to telemetry.  Time spent: [facility] Pager [phone]

## 2013-03-06 NOTE — ED Provider Notes (Signed)
CSN: [id number]     Arrival date & time 03/06/13  1014 History   First MD Initiated Contact with Patient 03/06/13 1017     Chief Complaint  Patient presents with  . Fatigue     (Consider location/radiation/quality/duration/timing/severity/associated sxs/prior Treatment) HPI Comments: 58 year old female presents with weakness and fatigue. His been a recurrent problem over the last 3 weeks. Section been occurring for longer but over the last 3 weeks his been happening more often. She gets diarrhea frequently which is been attributed to leading to her fatigue. She was seen here a couple days ago and discharged after feeling better with IV fluids. Since then she's not had diarrhea does have some lower abdominal pain. She states the pain is gone now. Is more she tried to walk out of bed which she normally can do and she almost fell because of her weakness. Her husband caught her so she did not fall. She has frequent headaches multiple brain surgeries. These headaches are not worsening. The weakness is diffuse and not unilateral. He does feel like her legs are weaker than her arms. This is been recurrent. Denies any urinary symptoms. No chest pain or neck pain.   Past Medical History  Diagnosis Date  . Brain cancer     Frontal lobe, 1993 and 2005  . Migraines   . Morton neuroma   . Seizures   . Migraine   . Fibromyalgia   . Right fibular fracture 07/12/2011  . 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 Appendectomy  1976  . Excision morton's neuroma      right foot  . Brain cancer      1993 and 2005  . Orif ankle fracture  07/12/2011    Procedure: OPEN REDUCTION INTERNAL FIXATION (ORIF) ANKLE FRACTURE;  Surgeon: Johnny Bridge, MD;  Location: [facility];  Service: Orthopedics;  Laterality: Right;   Family History  Problem Relation Age of Onset  . Diabetes Mother   . Diabetes  Brother   . Hypertension Mother   . Hypertension Father    History  Substance Use Topics  . Smoking status: Never Smoker   . Smokeless tobacco: Never Used  . Alcohol Use: No   OB History   Grav Para Term Preterm Abortions TAB SAB Ect Mult Living                 Review of Systems  Constitutional: Positive for fatigue. Negative for fever.  Respiratory: Negative for cough and shortness of breath.   Cardiovascular: Negative for chest pain.  Gastrointestinal: Positive for diarrhea. Negative for nausea, vomiting and blood in stool.  Genitourinary: Negative for dysuria, frequency and decreased urine volume.  Neurological: Positive for weakness and headaches.  All other systems reviewed and are negative.      Allergies  Versed; Ciprofloxacin; Cyclobenzaprine hcl; Methocarbamol; Metronidazole; and Penicillins  Home Medications   Current Outpatient Rx  Name  Route  Sig  Dispense  Refill  . butalbital-aspirin-caffeine (FIORINAL) 50-325-40 MG per capsule   Oral   Take 1 capsule by mouth every 6 (six) hours as needed.          . Calcium Citrate-Vitamin D (CITRACAL + D PO)   Oral   Take 1 tablet by mouth daily. CA 1200mg -Vitamin D 1000iu         . DULoxetine (CYMBALTA) 60  MG capsule      TAKE 1 CAPSULE BY MOUTH DAILY         . fenofibrate 160 MG tablet      TAKE 1 TABLET BY MOUTH DAILY         . fish oil-omega-3 fatty acids 1000 MG capsule   Oral   Take 1 g by mouth 2 (two) times daily.           Marland Kitchen levETIRAcetam (KEPPRA) 500 MG tablet   Oral   Take 1,500 mg by mouth 2 (two) times daily.          Marland Kitchen loperamide (IMODIUM) 2 MG capsule   Oral   Take 2 capsules (4 mg total) by mouth as needed for diarrhea or loose stools. Maximum 16 mg daily   20 capsule   0   . meloxicam (MOBIC) 15 MG tablet      TAKE 1 TABLET BY MOUTH DAILY         . methocarbamol (ROBAXIN) 500 MG tablet   Oral   Take 500 mg by mouth every 8 (eight) hours as needed for muscle  spasms.         Marland Kitchen omeprazole (PRILOSEC) 20 MG capsule      TAKE 1 CAPSULE BY MOUTH DAILY         . pregabalin (LYRICA) 150 MG capsule   Oral   Take 150 mg by mouth daily. TAKE ONE CAPSULE BY MOUTH TWICE DAILY         . promethazine (PHENERGAN) 25 MG tablet   Oral   Take 25 mg by mouth every 6 (six) hours as needed for nausea (for severe headache).         . rosuvastatin (CRESTOR) 10 MG tablet   Oral   Take 10 mg by mouth 3 (three) times a week. Monday, Wednesday, and friday         . traZODone (DESYREL) 50 MG tablet   Oral   Take 50 mg by mouth at bedtime. AS NEEDED FOR SLEEP         . verapamil (CALAN) 80 MG tablet   Oral   Take 80 mg by mouth 3 (three) times daily.          BP 101/59  Pulse 72  Temp(Src) 99.2 F (37.3 C) (Oral)  Resp 18  Ht 5\' 2"  (1.575 m)  Wt 205 lb (92.987 kg)  BMI 37.49 kg/m2  SpO2 100% Physical Exam  Nursing note and vitals reviewed. Constitutional: She is oriented to person, place, and time. She appears well-developed and well-nourished.  HENT:  Head: Normocephalic and atraumatic.  Right Ear: External ear normal.  Left Ear: External ear normal.  Nose: Nose normal.  Dry oral mucous membranes  Eyes: EOM are normal. Pupils are equal, round, and reactive to light. Right eye exhibits no discharge. Left eye exhibits no discharge.  Cardiovascular: Normal rate, regular rhythm and normal heart sounds.   Pulmonary/Chest: Effort normal and breath sounds normal.  Abdominal: Soft. There is tenderness in the suprapubic area.  Neurological: She is alert and oriented to person, place, and time. GCS eye subscore is 4. GCS verbal subscore is 5. GCS motor subscore is 6.  Reflex Scores:      Patellar reflexes are 2+ on the right side and 2+ on the left side.      Achilles reflexes are 2+ on the right side and 2+ on the left side. 5/5 strength in upper choice. 4/5 strength in  lower tremors bilaterally.  Skin: Skin is warm and dry.    ED Course   Procedures (including critical care time) Labs Review Labs Reviewed  CBC WITH DIFFERENTIAL - Abnormal; Notable for the following:    RBC 3.71 (*)    Hemoglobin 10.6 (*)    HCT 33.0 (*)    All other components within normal limits  COMPREHENSIVE METABOLIC PANEL - Abnormal; Notable for the following:    Albumin 3.2 (*)    GFR calc non Af Amer 64 (*)    GFR calc Af Amer 74 (*)    All other components within normal limits  URINALYSIS, ROUTINE W REFLEX MICROSCOPIC - Abnormal; Notable for the following:    APPearance CLOUDY (*)    Nitrite POSITIVE (*)    Leukocytes, UA MODERATE (*)    All other components within normal limits  URINE MICROSCOPIC-ADD ON - Abnormal; Notable for the following:    Bacteria, UA MANY (*)    All other components within normal limits  URINE CULTURE  I-STAT CG4 LACTIC ACID, ED   Imaging Review Dg Chest Portable 1 View  03/06/2013   CLINICAL DATA:  Fatigue  EXAM: PORTABLE CHEST - 1 VIEW  COMPARISON:  None available  FINDINGS: Lungs are clear. Heart size and mediastinal contours are within normal limits. No effusion. Visualized skeletal structures are unremarkable.  IMPRESSION: No acute cardiopulmonary disease.   Electronically Signed   By: Arne Cleveland M.D.   On: 03/06/2013 11:07     EKG Interpretation   Date/Time:  Sunday March 06 2013 10:15:37 EST Ventricular Rate:  74 PR Interval:  178 QRS Duration: 132 QT Interval:  418 QTC Calculation: 464 R Axis:   -46 Text Interpretation:  Sinus rhythm Nonspecific IVCD with LAD Left  ventricular hypertrophy No significant change since 07/12/11 Confirmed by  Eligah Anello  MD, Karin Pinedo ([id number]) on 03/06/2013 10:34:20 AM      MDM   Final diagnoses:  UTI (urinary tract infection)  Weakness    Patient is mildly hypotensive here to the high 80s. Responsive to fluids. She did feel somewhat improved with IV fluids but still feels overall weak. Her exam is consistent with largely weakness worsened upper shunting. She does  have intact DTRs. There is no unilateral symptoms do not feel CT of the head would be beneficial. She mentioned MRI given her complex brain history. Her urine is also consistent with a UTI, which she gets a couple times a year's. We'll give her IV Rocephin (has had keflex w/o issues) and admit to the hospitalist. Neuro was consulted in the ER.    Ephraim Hamburger, MD 03/06/13 [phone]

## 2013-03-06 NOTE — Consult Note (Signed)
Reason for Consult: Weakness with falls Referring Physician: Dr Regenia Skeeter  CC: Weakness  HPI: [name] is a 58 y.o. female with very complicated past medical history. Almost all of the history was obtained from the patient's husband. 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 This was resected at [facility]. In the year 2000 and patient began having generalized seizures. She was evaluated again at [facility] and placed on seizure medication. In 2005 she had a recurrence of her tumor which was again resected at [facility]. She now has occasional seizures at which time she becomes unable to speak, stares straight ahead, and the right side of her face "draws up". This despite being on Keppra. She is still followed at [facility] and has MRIs every other year. According to the patient's husband the last MRI in December 2014 was clear.  Over the past 3 months the patient has been having progressive weakness and multiple falls. She's not had any significant injury associated with her falls except for a laceration on her back when she fell in the bathroom. Her husband has noted that she becomes very weak in the shower. She had one actual syncopal episode while in the shower. EMS was called at that time and found her systolic blood pressure to be 50. She was brought to the emergency room on Thursday following the syncopal episode. She had been having some diarrhea intermittently for 3 weeks and it was felt that the patient was just dehydrated. She was given IV fluids, stool samples were taken, and she was released. Stool neg for C diff.  The patient's husband has also noted that her memory has gotten very bad over the past couple of months. She at times has difficulty speaking and seems mildly confused. She is unable to learn new information. Occasionally when they have gone shopping she will become weak and unable to walk out of the store. The patient's  husband believes that she is weaker on her right side than on her left although bilateral lower extremity weakness noted on exam. Prior to the last several months the patient did tire easily although she was able to perform activities of daily living and small household chores such as washing dishes.  Past Medical History  Diagnosis Date  . Brain cancer     Frontal lobe, 1993 and 2005  . Migraines   . Morton neuroma   . Seizures   . Migraine   . Fibromyalgia   . Right fibular fracture 07/12/2011  . Malignant neoplasm of frontal lobe of brain 09/18/2008    Qualifier: Diagnosis of  By: Birdie Riddle MD, Belenda Cruise      Past Surgical History  Procedure Laterality Date  . Appendectomy  1976  . Excision morton's neuroma      right foot  . Brain cancer      1993 and 2005  . Orif ankle fracture  07/12/2011    Procedure: OPEN REDUCTION INTERNAL FIXATION (ORIF) ANKLE FRACTURE;  Surgeon: Johnny Bridge, MD;  Location: [place];  Service: Orthopedics;  Laterality: Right;    Family History  Problem Relation Age of Onset  . Diabetes Mother   . Diabetes Brother   . Hypertension Mother   . Hypertension Father     Social History:  reports that she has never smoked. She has never used smokeless tobacco. She reports that she does not drink alcohol or use illicit drugs.  Allergies  Allergen Reactions  . Versed [Midazolam] Other (See Comments)    Severe respiratory depression, had to be masked in preop holding.  . Ciprofloxacin     REACTION: NAUSEA AND VOMITING  . Cyclobenzaprine Hcl   . Methocarbamol     REACTION: itching  . Metronidazole   . Penicillins     REACTION: RASH    Medications: Current Facility-Administered Medications  Medication Dose Route Frequency Provider Last Rate Last Dose  . levETIRAcetam (KEPPRA) tablet 500 mg  500 mg Oral Once Ephraim Hamburger, MD       Current Outpatient Prescriptions  Medication Sig Dispense Refill  . Calcium Citrate-Vitamin D (CITRACAL + D PO) Take 1  tablet by mouth daily. CA 1200mg -Vitamin D 1000iu      . Cholecalciferol (VITAMIN D-3 PO) Take 1 tablet by mouth daily.      . DULoxetine (CYMBALTA) 60 MG capsule Take 60 mg by mouth daily.      . fenofibrate 160 MG tablet Take 160 mg by mouth daily.      . fish oil-omega-3 fatty acids 1000 MG capsule Take 1 g by mouth 2 (two) times daily.        Marland Kitchen levETIRAcetam (KEPPRA) 500 MG tablet Take 1,500 mg by mouth 2 (two) times daily.       Marland Kitchen loperamide (IMODIUM) 2 MG capsule Take 2 capsules (4 mg total) by mouth as needed for diarrhea or loose stools. Maximum 16 mg daily  20 capsule  0  . meloxicam (MOBIC) 15 MG tablet Take 15 mg by mouth daily.      . methocarbamol (ROBAXIN) 500 MG tablet Take 500 mg by mouth every 8 (eight) hours as needed for muscle spasms.      Marland Kitchen omeprazole (PRILOSEC) 20 MG capsule Take 20 mg by mouth daily.      . pregabalin (LYRICA) 150 MG capsule Take 150 mg by mouth 2 (two) times daily.       . promethazine (PHENERGAN) 25 MG tablet Take 25 mg by mouth every 6 (six) hours as needed for nausea (for severe headache).      . rosuvastatin (CRESTOR) 10 MG tablet Take 10 mg by mouth 3 (three) times a week. Monday, Wednesday, and friday      . traZODone (DESYREL) 50 MG tablet Take 50 mg by mouth at bedtime as needed for sleep.       . verapamil (CALAN) 80 MG tablet Take 80 mg by mouth 3 (three) times daily.      . [DISCONTINUED] gabapentin (NEURONTIN) 100 MG capsule         I have reviewed the patient's current medications.  ROS: History obtained from patient's husband  General ROS: negative for - chills, fatigue, fever, night sweats, weight gain or weight loss. Psychological ROS: negative for - behavioral disorder, hallucinations, memory difficulties, mood swings or suicidal ideation Ophthalmic ROS: negative for - blurry vision, double vision, eye pain or loss of vision ENT ROS: negative for - epistaxis, nasal discharge, oral lesions, sore throat, tinnitus or vertigo Allergy and  Immunology ROS: negative for - hives or itchy/watery eyes Hematological and Lymphatic ROS: negative for - bleeding problems, bruising or swollen lymph nodes Endocrine ROS: negative for - galactorrhea, hair pattern changes, polydipsia/polyuria or temperature intolerance Respiratory ROS: negative for - cough, hemoptysis, or wheezing. Positive for occasional shortness of breath. Cardiovascular ROS: negative for - chest pain, dyspnea on exertion, edema or irregular heartbeat Gastrointestinal ROS: negative for - abdominal pain, hematemesis, nausea/vomitingr Positive  for diarrhea and stool incontinence Genito-Urinary ROS: negative for - dysuria, hematuria, incontinence. Positive for urinary frequency/urgency. Positive for UTI. Musculoskeletal ROS: negative for - joint swelling or muscular weakness. Positive for fibromyalgia. Positive for arthritis of the left knee. Neurological ROS: as noted in HPI Dermatological ROS: negative for rash and skin lesion changes   Physical Examination: Blood pressure 119/69, pulse 62, temperature 99.2 F (37.3 C), temperature source Oral, resp. rate 14, height 5\' 2"  (1.575 m), weight 92.987 kg (205 lb), SpO2 100.00%.  General - 58 year old female with a flat affect but in no acute distress. Heart - Regular rate and rhythm - no murmer noted Lungs - Clear to auscultation Extremities - Trace edema Skin - Cool and dry   Neurologic Examination NEUROLOGIC:  Mental Status: Alert, oriented, able to perform simple calculations. Speech fluent. Able to follow 3 step commands. Some mild confusion when explaining sxs. Cranial Nerves: II: Discs not visualized; Visual fields grossly normal, pupils equal, round, reactive to light and accommodation III,IV, VI: ptosis not present, extra-ocular motions intact bilaterally V,VII: smile symmetric, facial light touch sensation normal bilaterally VIII: hearing normal bilaterally IX,X: gag reflex present XI: bilateral shoulder  shrug XII: midline tongue extension Motor: Right : Upper extremity   5/5    Left:     Upper extremity   5/5  Lower extremity   3/5     Lower extremity   3/5 Tone and bulk:normal tone throughout; no atrophy noted Sensory: Pinprick and light touch intact throughout, bilaterally Deep Tendon Reflexes: 3+ and symmetric throughout Plantars: Right: downgoing   Left: downgoing Cerebellar: normal finger-to-nose, alternating movements and normal heel-to-shin test all normal to perform very slowly Gait: Did not ambulate the patient for safety reasons   Laboratory Studies:   Basic Metabolic Panel:  Recent Labs Lab 03/03/13 1226 03/06/13 1129  NA 142 142  K 4.4 4.3  CL 104 106  CO2 24 21  GLUCOSE 76 87  BUN 17 12  CREATININE 1.13* 0.98  CALCIUM 9.6 9.1    Liver Function Tests:  Recent Labs Lab 03/03/13 1226 03/06/13 1129  AST 21 20  ALT 11 10  ALKPHOS 58 44  BILITOT 0.3 0.3  PROT 7.7 6.8  ALBUMIN 3.8 3.2*   No results found for this basename: LIPASE, AMYLASE,  in the last 168 hours No results found for this basename: AMMONIA,  in the last 168 hours  CBC:  Recent Labs Lab 03/03/13 1226 03/06/13 1129  WBC 6.6 4.0  NEUTROABS 4.5 2.4  HGB 11.3* 10.6*  HCT 35.2* 33.0*  MCV 88.4 88.9  PLT 263 216    Cardiac Enzymes:  Recent Labs Lab 03/03/13 1226  TROPONINI <0.30    BNP: No components found with this basename: POCBNP,   CBG: No results found for this basename: GLUCAP,  in the last 168 hours  Microbiology: Results for orders placed during the hospital encounter of 03/03/13  CLOSTRIDIUM DIFFICILE BY PCR     Status: None   Collection Time    03/03/13  2:40 PM      Result Value Ref Range Status   C difficile by pcr NEGATIVE  NEGATIVE Final  STOOL CULTURE     Status: None   Collection Time    03/03/13  2:40 PM      Result Value Ref Range Status   Specimen Description STOOL   Final   Special Requests NONE   Final   Culture     Final   Value: NO  SUSPICIOUS COLONIES, CONTINUING TO HOLD     Performed at [facility]   Report Status PENDING   Incomplete    Coagulation Studies: No results found for this basename: LABPROT, INR,  in the last 72 hours  Urinalysis:  Recent Labs Lab 03/03/13 1431 03/06/13 1310  COLORURINE YELLOW YELLOW  LABSPEC 1.022 1.017  PHURINE 6.0 5.5  GLUCOSEU NEGATIVE NEGATIVE  HGBUR NEGATIVE NEGATIVE  BILIRUBINUR NEGATIVE NEGATIVE  KETONESUR NEGATIVE NEGATIVE  PROTEINUR NEGATIVE NEGATIVE  UROBILINOGEN 0.2 0.2  NITRITE POSITIVE* POSITIVE*  LEUKOCYTESUR SMALL* MODERATE*    Lipid Panel:     Component Value Date/Time   CHOL 193 11/10/2012 0945   TRIG 145.0 11/10/2012 0945   HDL 58.70 11/10/2012 0945   CHOLHDL 3 11/10/2012 0945   VLDL 29.0 11/10/2012 0945   LDLCALC 105* 11/10/2012 0945    HgbA1C:  No results found for this basename: HGBA1C    Urine Drug Screen:   No results found for this basename: labopia, cocainscrnur, labbenz, amphetmu, thcu, labbarb    Alcohol Level: No results found for this basename: ETH,  in the last 168 hours  Other results: EKG: - Sinus rhythm rate 74 with nonspecific intraventricular conduction delay.  Imaging:  Dg Chest Portable 1 View 03/06/2013    No acute cardiopulmonary disease.      Assessment/Plan:  58 year old female with a history of malignant neoplasm in the frontal lobe the brain resected x2 with subsequent seizure activity. Now with diffuse generalized weakness, intermittent confusion, memory problems, falls, hypotension, diarrhea, and urinary tract infection. MRI of the brain performed at [facility] in December of 2014 showed no acute abnormalities.  Further assessment and plan per Dr. Armida Sans.   Mikey Bussing PA-C [facility] Pager [phone] 03/06/2013, 3:46 PM  Patient seen and examined together with physician assistant and I concur with the assessment and plan.  Dorian Pod, MD  According to patient's husband, she  has been experiencing progressive gait impairment and weakness of the lower extremities that had gotten worse in the past 3 weeks. Had had few falls and 2 episodes of bladder incontinence. She has hyperreflexia in the lower extremities but no clonus or other corticospinal signs. Will suggest MRI cervical-thoracic spine, as her syndrome doesn't seem to localize to the central or peripheral nerve. Will follow up..PT  Dorian Pod, MD

## 2013-03-07 ENCOUNTER — Inpatient Hospital Stay (HOSPITAL_COMMUNITY): Payer: 59

## 2013-03-07 LAB — CBC
HCT: 28.3 % — ABNORMAL LOW (ref 36.0–46.0)
Hemoglobin: 9.2 g/dL — ABNORMAL LOW (ref 12.0–15.0)
MCH: 28.8 pg (ref 26.0–34.0)
MCHC: 32.5 g/dL (ref 30.0–36.0)
MCV: 88.7 fL (ref 78.0–100.0)
PLATELETS: 199 10*3/uL (ref 150–400)
RBC: 3.19 MIL/uL — AB (ref 3.87–5.11)
RDW: 15.3 % (ref 11.5–15.5)
WBC: 2.5 10*3/uL — ABNORMAL LOW (ref 4.0–10.5)

## 2013-03-07 LAB — BASIC METABOLIC PANEL
BUN: 9 mg/dL (ref 6–23)
CALCIUM: 8.5 mg/dL (ref 8.4–10.5)
CO2: 23 meq/L (ref 19–32)
CREATININE: 0.85 mg/dL (ref 0.50–1.10)
Chloride: 110 mEq/L (ref 96–112)
GFR, EST AFRICAN AMERICAN: 88 mL/min — AB (ref >90)
GFR, EST NON AFRICAN AMERICAN: 76 mL/min — AB (ref >90)
Glucose, Bld: 84 mg/dL (ref 70–99)
Potassium: 4 mEq/L (ref 3.7–5.3)
SODIUM: 142 meq/L (ref 137–147)

## 2013-03-07 LAB — TSH: TSH: 1.402 u[IU]/mL (ref 0.350–4.500)

## 2013-03-07 LAB — IRON AND TIBC
Iron: 25 ug/dL — ABNORMAL LOW (ref 42–135)
Saturation Ratios: 6 % — ABNORMAL LOW (ref 20–55)
TIBC: 423 ug/dL (ref 250–470)
UIBC: 398 ug/dL (ref 125–400)

## 2013-03-07 LAB — FOLATE: Folate: 12.2 ng/mL

## 2013-03-07 LAB — STOOL CULTURE

## 2013-03-07 LAB — PROTIME-INR
INR: 1.16 (ref 0.00–1.49)
Prothrombin Time: 14.6 seconds (ref 11.6–15.2)

## 2013-03-07 LAB — TROPONIN I

## 2013-03-07 LAB — FERRITIN: Ferritin: 15 ng/mL (ref 10–291)

## 2013-03-07 LAB — CORTISOL: CORTISOL PLASMA: 2.6 ug/dL

## 2013-03-07 LAB — VITAMIN B12: Vitamin B-12: 302 pg/mL (ref 211–911)

## 2013-03-07 NOTE — Evaluation (Signed)
Physical Therapy Evaluation Patient Details Name: [name] MRN: [id number] DOB: 1955-06-14 Today's Date: 03/07/2013 Time: 3086-5784 PT Time Calculation (min): 18 min  PT Assessment / Plan / Recommendation History of Present Illness  Weakness. With history of malignant neoplasm of the frontal lobe of the brain diagnosed in 1993 status post resection at [facility]. History of seizures, recurrence of brain tumor in 2005 status post resection, history of migraines, seizures, fibromyalgia who presents to the ED with several months of generalized lower extremity weakness since October/November of 2014, 3 week history of intermittent diarrhea and dragging of the right there greater than the left. Patient endorses falls.  Clinical Impression  Pt admitted with above. Pt currently with functional limitations due to the deficits listed below (see PT Problem List). Pt's husband reports that pt's mobility and function vary widely. Some days she can do things for herself and other days he isn't able to assist her enough to mobilize.  Pt will benefit from skilled PT to increase their independence and safety with mobility to allow discharge to the venue listed below.       PT Assessment  Patient needs continued PT services    Follow Up Recommendations  CIR    Does the patient have the potential to tolerate intense rehabilitation      Barriers to Discharge        Equipment Recommendations  None recommended by PT    Recommendations for Other Services     Frequency Min 3X/week    Precautions / Restrictions Precautions Precautions: Fall Restrictions Weight Bearing Restrictions: No   Pertinent Vitals/Pain No c/o's      Mobility  Bed Mobility Overal bed mobility: Needs Assistance Bed Mobility: Sidelying to Sit Rolling: Min assist Sidelying to sit: Mod assist General bed mobility comments: Assist to bring trunk up and verbal/tactile cues to continue movement (Pt sat partway up with left  leg still on bed and stayed in that position until cued to continue. Transfers Overall transfer level: Needs assistance Equipment used: Rolling walker (2 wheeled) Transfers: Sit to/from Stand Sit to Stand: Min assist Ambulation/Gait Ambulation/Gait assistance: Min assist Ambulation Distance (Feet): 80 Feet Assistive device: Rolling walker (2 wheeled) Gait Pattern/deviations: Step-through pattern;Decreased step length - right;Decreased step length - left;Shuffle Gait velocity interpretation: Below normal speed for age/gender General Gait Details: Verbal cues to stand more erect initially.    Exercises     PT Diagnosis: Difficulty walking;Generalized weakness  PT Problem List: Decreased strength;Decreased activity tolerance;Decreased balance;Decreased mobility PT Treatment Interventions: DME instruction;Gait training;Functional mobility training;Therapeutic activities;Therapeutic exercise;Balance training;Patient/family education     PT Goals(Current goals can be found in the care plan section) Acute Rehab PT Goals Patient Stated Goal: to be independent PT Goal Formulation: With patient Time For Goal Achievement: 03/14/13 Potential to Achieve Goals: Good  Visit Information  Last PT Received On: 03/07/13 Assistance Needed: +1 History of Present Illness: Weakness. 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 History of seizures, recurrence of brain tumor in 2005 status post resection, history of migraines, seizures, fibromyalgia who presents to the ED with several months of generalized lower extremity weakness since October/November of 2014, 3 week history of intermittent diarrhea and dragging of the right there greater than the left. Patient endorses falls.       Prior Functioning  Home Living Family/patient expects to be discharged to:: Private residence Living Arrangements: Spouse/significant  other Available Help at  Discharge: Family ("available most of the time" per husband) Type of Home: House Home Access: Stairs to enter [employer] of Steps: 1 Entrance Stairs-Rails: Right;Left;Can reach both Home Layout: One level Home Equipment: Walker - 2 wheels;Bedside commode;Shower seat Prior Function Level of Independence: Needs assistance Gait / Transfers Assistance Needed: Up until Oct pt was Independent with ambulation and transfers, since then she has needed intermittent A (having periods where she cannot walk and other periods where she walks as she normally does--"[employer]  Cognition Arousal/Alertness: Awake/alert Behavior During Therapy: WFL for tasks assessed/performed Overall Cognitive Status:  (decreased problem solving with getting OOB) Area of Impairment: Problem solving Problem Solving: Requires verbal cues;Requires tactile cues General Comments: Pt needed cues for sequencing for OOB.    Extremity/Trunk Assessment Upper Extremity Assessment Upper Extremity Assessment: Generalized weakness Lower Extremity Assessment Lower Extremity Assessment: Generalized weakness   Balance Balance Overall balance assessment: Needs assistance Sitting-balance support: No upper extremity supported Sitting balance-Leahy Scale: Good Sitting balance - Comments: tendency to posterior lean Postural control: Posterior lean Standing balance support: Bilateral upper extremity supported Standing balance-Leahy Scale: Poor Standing balance comment: Posterior lean initially.  End of Session PT - End of Session Equipment Utilized During Treatment: Gait belt Activity Tolerance: Patient tolerated treatment well Patient left: in bed;with call bell/phone within reach;with family/visitor present Nurse Communication: Mobility status  GP     Destynie Toomey 03/07/2013, 3:59 PM  [facility] PT [phone]

## 2013-03-07 NOTE — Progress Notes (Signed)
Rehab Admissions Coordinator Note:  Patient was screened by Retta Diones for appropriateness for an Inpatient Acute Rehab Consult.  At this time, an inpatient rehab consult has already been ordered and is pending.  Retta Diones 03/07/2013, 3:36 PM  I can be reached at [phone].

## 2013-03-07 NOTE — Progress Notes (Signed)
Pt down for MRI of SPINE

## 2013-03-07 NOTE — Evaluation (Signed)
Occupational Therapy Evaluation Patient Details Name: [name] MRN: [id number] DOB: [birth year] Today's Date: 03/07/2013 Time: 0998-3382 OT Time Calculation (min): 29 min  OT Assessment / Plan / Recommendation History of present illness Weakness. 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 History of seizures, recurrence of brain tumor in 2005 status post resection, history of migraines, seizures, fibromyalgia who presents to the ED with several months of generalized lower extremity weakness since October/November of 2014, 3 week history of intermittent diarrhea and dragging of the right there greater than the left. Patient endorses falls.   Clinical Impression   This 58 yo female admitted with above presents to acute OT with generalized weakness causing decreased safety with BADLs, decreased mobility, increased fall risk all affecting pt's ability to care for herself. Pt will benefit from continued OT with follow up on CIR.    OT Assessment  Patient needs continued OT Services    Follow Up Recommendations  CIR       Equipment Recommendations  None recommended by OT    Recommendations for Other Services Rehab consult  Frequency  Min 2X/week    Precautions / Restrictions Precautions Precautions: Fall Restrictions Weight Bearing Restrictions: No   Pertinent Vitals/Pain No c/o    ADL  Eating/Feeding: Independent Where Assessed - Eating/Feeding: Chair Grooming: Set up;Supervision/safety Where Assessed - Grooming: Supported sitting Upper Body Bathing: Set up;Supervision/safety Where Assessed - Upper Body Bathing: Supported sitting Lower Body Bathing: Moderate assistance Where Assessed - Lower Body Bathing: Supported sit to stand Upper Body Dressing: Set up;Supervision/safety Where Assessed - Upper Body Dressing: Supported sitting Lower Body Dressing: Moderate assistance Where Assessed - Lower  Body Dressing: Supported sit to Lobbyist: Minimal Print production planner Method: Sit to Loss adjuster, chartered:  (Bed>recliner 2 feet away) Toileting - Water quality scientist and Hygiene: Moderate assistance Where Assessed - Toileting Clothing Manipulation and Hygiene:  (sit<>stand) Equipment Used: Gait belt;Rolling walker Transfers/Ambulation Related to ADLs: first time sit>stand Mod A without AD; second time sit>stand min A with RW;    OT Diagnosis: Generalized weakness;Cognitive deficits  OT Problem List: Decreased strength;Impaired balance (sitting and/or standing);Decreased cognition;Obesity OT Treatment Interventions: Self-care/ADL training;DME and/or AE instruction;Patient/family education;Cognitive remediation/compensation;Therapeutic exercise;Balance training   OT Goals(Current goals can be found in the care plan section) Acute Rehab OT Goals OT Goal Formulation: With patient/family Time For Goal Achievement: 03/21/13 Potential to Achieve Goals: Good  Visit Information  Last OT Received On: 03/07/13 Assistance Needed: +1 History of Present Illness: Weakness. 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 History of seizures, recurrence of brain tumor in 2005 status post resection, history of migraines, seizures, fibromyalgia who presents to the ED with several months of generalized lower extremity weakness since October/November of 2014, 3 week history of intermittent diarrhea and dragging of the right there greater than the left. Patient endorses falls.       Prior [place] expects to be discharged to:: Private residence Living Arrangements: Spouse/significant other Available Help at Discharge: Family ("available most of the time" per husband) Type of Home: House Home Access: Stairs to enter [employer] of Steps: 1 Entrance  Stairs-Rails: Right;Left;Can reach both Home Layout: One [place]: [place] - 2 wheels;Bedside commode;Shower seat Prior Function Level of Independence: Needs assistance Gait / Transfers Assistance Needed: Up until  Oct pt was Independent with ambulation and transfers, since then she has needed intermittent A (having periods where she cannot walk and other periods where she walks as she normally does--"[employer]"          Vision/Perception Vision - History Patient Visual Report: No change from baseline   Cognition  Cognition Arousal/Alertness: Awake/alert Behavior During Therapy: WFL for tasks assessed/performed Overall Cognitive Status:  (decreased problem solving with getting OOB)    Extremity/Trunk Assessment Upper Extremity Assessment Upper Extremity Assessment: Generalized weakness     Mobility Bed Mobility Overal bed mobility: Needs Assistance Bed Mobility: Rolling;Sidelying to Sit Rolling: Min assist Sidelying to sit: Mod assist Transfers Overall transfer level: Needs assistance Equipment used: Rolling walker (2 wheeled) Transfers: Sit to/from Stand Sit to Stand: Mod assist        Balance Balance Overall balance assessment: Needs assistance Sitting-balance support: Feet supported;Single extremity supported Sitting balance-Leahy Scale: Good Sitting balance - Comments: tendency to posterior lean Postural control: Posterior lean Standing balance support: Bilateral upper extremity supported Standing balance-Leahy Scale: Poor Standing balance comment: tendency for posterior lean intermittently   End of Session OT - End of Session Equipment Utilized During Treatment: Gait belt;Rolling walker Activity Tolerance: Patient limited by fatigue Patient left: in chair;with call bell/phone within reach;with family/visitor present Nurse Communication: Mobility status       Almon Register [phone] 03/07/2013, 2:32 PM

## 2013-03-07 NOTE — Progress Notes (Signed)
PROGRESS NOTE  [name] HAL:[id number] DOB: [birth year] DOA: 03/06/2013 PCP: Annye Asa, MD  HPI/Subjective: 58 y.o. 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 Admitted 3/1 for weakness of both legs and UTI and treated with IVF and IV Rocephin.   Weakness has improved today and pt has no urinary complaints.   Assessment/Plan:  Principal Problem:   Weakness of both legs   - Suspect this is multifactorial 2/2 to hypotension/UTI/diarrhea/deconditioning, MRI of the cervical/thoracic spine negative.  -PT eval, treat the above causes, and follow clinical course -Neuro has no further recommendations   Active Problems:   UTI (urinary tract infection)   - no urinary symptoms today   - UA positive for nitrites and moderate leukocytes   - continue IV rocephin   - urine culture pending    Malignant neoplasm of frontal lobe of brain   - stable   - last MRI done in 12/2012 was clear   -follows at [facility]   - stable   - hold po medicines    DEPRESSIVE DISORDER   - stable   - c/w Cymbalta    Migraine   - stable   - discontinue verapamil    Hypotension    - likely 2/2 to intermittent diarrhea and dehydration    - BP responded to IV fluids    - continue monitoring and IV fluids    Anemia   - likely 2/2 to anemia of chronic disease    Seizure Disorder   -c/w Keppra  DVT Prophylaxis:  Lovenox  Code Status: full Family Communication: patient has been updated Disposition Plan: inpatient   Consultants:  Neuro  Procedures:    Antibiotics:  Rocephin IV 1 g Q24H  Objective: Filed Vitals:   03/06/13 1716 03/06/13 2052 03/07/13 0515 03/07/13 1256  BP: 117/82 114/76 114/76   Pulse: 61 65 60   Temp: 97.3 F (36.3 C) 97.4 F (36.3 C) 97.3 F (36.3 C)   TempSrc: Oral Oral Oral   Resp: 16 16 16    Height:      Weight:      SpO2:  100% 99% 97%    Intake/Output  Summary (Last 24 hours) at 03/07/13 1311 Last data filed at 03/07/13 0500  Gross per 24 hour  Intake 1286.67 ml  Output      3 ml  Net 1283.67 ml   Filed Weights   03/06/13 1015  Weight: 92.987 kg (205 lb)    Exam: General: Well developed, well nourished, NAD, appears stated age  HEENT:  EOMI, Anicteic Sclera  Neck: Supple, no masses  Cardiovascular: RRR, S1 S2 auscultated, no rubs, murmurs or gallops.   Respiratory: Clear to auscultation bilaterally with equal chest rise, no rales, rhonchi or wheezes Abdomen: Soft, nontender, nondistended, + bowel sounds  Extremities: warm dry without cyanosis clubbing or edema.  Neuro: AAOx3, Strength 4/5 in upper and lower extremities  Skin: Without rashes exudates or nodules.   Psych: Normal affect and demeanor, mild confusion   Data Reviewed: Basic Metabolic Panel:  Recent Labs Lab 03/03/13 1226 03/06/13 1129 03/06/13 1622 03/07/13 0602  NA 142 142  --  142  K 4.4 4.3  --  4.0  CL 104 106  --  110  CO2 24 21  --  23  GLUCOSE 76 87  --  84  BUN 17 12  --  9  CREATININE 1.13* 0.98 0.96  0.85  CALCIUM 9.6 9.1  --  8.5  MG  --   --  1.8  --    Liver Function Tests:  Recent Labs Lab 03/03/13 1226 03/06/13 1129  AST 21 20  ALT 11 10  ALKPHOS 58 44  BILITOT 0.3 0.3  PROT 7.7 6.8  ALBUMIN 3.8 3.2*   CBC:  Recent Labs Lab 03/03/13 1226 03/06/13 1129 03/06/13 1622 03/07/13 0602  WBC 6.6 4.0 2.9* 2.5*  NEUTROABS 4.5 2.4  --   --   HGB 11.3* 10.6* 9.4* 9.2*  HCT 35.2* 33.0* 28.7* 28.3*  MCV 88.4 88.9 88.0 88.7  PLT 263 216 207 199   Cardiac Enzymes:  Recent Labs Lab 03/03/13 1226 03/06/13 1622 03/06/13 2156 03/07/13 0602  TROPONINI <0.30 <0.30 <0.30 <0.30    Recent Results (from the past 240 hour(s))  CLOSTRIDIUM DIFFICILE BY PCR     Status: None   Collection Time    03/03/13  2:40 PM      Result Value Ref Range Status   C difficile by pcr NEGATIVE  NEGATIVE Final  STOOL CULTURE     Status: None    Collection Time    03/03/13  2:40 PM      Result Value Ref Range Status   Specimen Description STOOL   Final   Special Requests NONE   Final   Culture     Final   Value: NO SALMONELLA, SHIGELLA, CAMPYLOBACTER, YERSINIA, OR E.COLI 0157:H7 ISOLATED     Performed at [facility]   Report Status 03/07/2013 FINAL   Final  URINE CULTURE     Status: None   Collection Time    03/06/13  1:10 PM      Result Value Ref Range Status   Specimen Description URINE, CATHETERIZED   Final   Special Requests NONE   Final   Culture  Setup Time     Final   Value: 03/06/2013 19:17     Performed at [facility]     Final   Value: >=100,000 COLONIES/ML     Performed at [facility]   Culture     Final   Value: [place]     Performed at [facility]   Report Status PENDING   Incomplete  CULTURE, BLOOD (ROUTINE X 2)     Status: None   Collection Time    03/06/13  4:22 PM      Result Value Ref Range Status   Specimen Description BLOOD LEFT ARM   Final   Special Requests BOTTLES DRAWN AEROBIC AND ANAEROBIC 5CC EACH   Final   Culture  Setup Time     Final   Value: 03/06/2013 20:40     Performed at [facility]   Culture     Final   Value:        BLOOD CULTURE RECEIVED NO GROWTH TO DATE CULTURE WILL BE HELD FOR 5 DAYS BEFORE ISSUING A FINAL NEGATIVE REPORT     Performed at [facility]   Report Status PENDING   Incomplete  CULTURE, BLOOD (ROUTINE X 2)     Status: None   Collection Time    03/06/13  4:26 PM      Result Value Ref Range Status   Specimen Description BLOOD RIGHT HAND   Final   Special Requests BOTTLES DRAWN AEROBIC ONLY 5CC   Final   Culture  Setup Time  Final   Value: 03/06/2013 20:40     Performed at [facility]   Culture     Final   Value:        BLOOD CULTURE RECEIVED NO GROWTH TO DATE CULTURE WILL BE HELD FOR 5 DAYS BEFORE ISSUING A FINAL NEGATIVE REPORT     Performed at [facility]    Report Status PENDING   Incomplete     Studies: Mr Cervical Spine Wo Contrast  03/07/2013   CLINICAL DATA:  Weakness.  History of brain tumor.  EXAM: MRI CERVICAL SPINE WITHOUT CONTRAST  TECHNIQUE: Multiplanar, multisequence MR imaging was performed. No intravenous contrast was administered.  COMPARISON:  Cervical spine MRI 09/05/2010.  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 Normal alignment of the cervical vertebral bodies. They demonstrate normal marrow signal. No findings for osseous metastatic disease. The cervical spinal cord is grossly normal. No cord lesions, signal abnormality or syrinx.  Axial images are limited by motion but no obvious cervical disc protrusions, spinal or foraminal stenosis.  IMPRESSION: Limited examination due to patient motion.  No disc protrusions, spinal or foraminal stenosis.  No findings for osseous metastatic disease or spinal cord lesions.   Electronically Signed   By: Kalman Jewels M.D.   On: 03/07/2013 09:41   Mr Thoracic Spine Wo Contrast  03/07/2013   CLINICAL DATA:  Weakness.  History of brain tumor.  EXAM: MRI THORACIC SPINE WITHOUT CONTRAST  TECHNIQUE: Multiplanar, multisequence MR imaging was performed. No intravenous contrast was administered.  COMPARISON:  None.  FINDINGS: The thoracic vertebral bodies are normally aligned and demonstrate normal marrow signal. No findings for osseous metastatic disease. The thoracic spinal cord is grossly normal despite motion artifact. No cord lesions or syrinx.  No thoracic disc protrusions, obvious spinal or foraminal stenosis despite motion artifact. No foraminal lesions.  IMPRESSION: Unremarkable thoracic spine MRI examination. No obvious cord lesions, spinal or foraminal stenosis. Exam is limited by patient motion.   Electronically Signed   By: Kalman Jewels M.D.   On: 03/07/2013 09:44   Dg Chest Portable 1 View  03/06/2013   CLINICAL DATA:  Fatigue  EXAM: PORTABLE CHEST - 1 VIEW   COMPARISON:  None available  FINDINGS: Lungs are clear. Heart size and mediastinal contours are within normal limits. No effusion. Visualized skeletal structures are unremarkable.  IMPRESSION: No acute cardiopulmonary disease.   Electronically Signed   By: Arne Cleveland M.D.   On: 03/06/2013 11:07    Scheduled Meds: . cefTRIAXone (ROCEPHIN)  IV  1 g Intravenous Q24H  . DULoxetine  60 mg Oral Daily  . enoxaparin (LOVENOX) injection  40 mg Subcutaneous Q24H  . fenofibrate  160 mg Oral Daily  . levETIRAcetam  1,500 mg Oral BID  . meloxicam  15 mg Oral Daily  . omega-3 acid ethyl esters  1 g Oral BID  . pantoprazole  40 mg Oral Q0600  . pregabalin  150 mg Oral BID  . sodium chloride  3 mL Intravenous Q12H   Continuous Infusions: . sodium chloride 0.9 % 1,000 mL infusion 100 mL/hr at 03/06/13 [place] [facility] PA-S  [facility] Pager [phone]. If 7PM-7AM, please contact night-coverage at [email or web address], password [facility] 03/07/2013, 1:11 PM  LOS: 1 day   Attending Seen and examined, the with the above assessment and plan. MRI of the spine negative, suspect numerous reasons because of her generalized weakness including hypertension, diarrhea and UTI. Neurology has no further recommendations, we'll  continue physical therapy, obtain  [facility] inpatient rehabilitation evaluation.  Nena Alexander MD

## 2013-03-07 NOTE — Consult Note (Signed)
Physical Medicine and Rehabilitation Consult  Reason for Consult: Gait disorder/UTI Referring Physician: Dr. Lenna Sciara.    HPI: [name] is a 58 y.o. 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 Admitted 03/06/13 with four  month history of progressive weakness with difficulty walking as well as multiple falls.  She was found to have UTI and started on IV Rocephin as well as IVF for soft BP. MRI of Cervicothoracic spine without HNP,  stenosis or metastatic disease.  PT/OT evaluations done yesterday and CIR recommended by rehab team.     ROS  Past Medical History  Diagnosis Date  . Brain cancer     Frontal lobe, 1993 and 2005  . Migraines   . Morton neuroma   . Seizures   . Migraine   . Fibromyalgia   . Right fibular fracture 07/12/2011  . 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 Anemia 03/06/2013   Past Surgical History  Procedure Laterality Date  . Appendectomy  1976  . Excision morton's neuroma      right foot  . Brain cancer      1993 and 2005  . Orif ankle fracture  07/12/2011    Procedure: OPEN REDUCTION INTERNAL FIXATION (ORIF) ANKLE FRACTURE;  Surgeon: Johnny Bridge, MD;  Location: [place];  Service: Orthopedics;  Laterality: Right;   Family History  Problem Relation Age of Onset  . Diabetes Mother   . Diabetes Brother   . Hypertension Mother   . Hypertension Father    Social History:  Married.     reports that she has never smoked. She has never used smokeless tobacco. She reports that she does not drink alcohol or use illicit drugs.   Allergies  Allergen Reactions  . Versed [Midazolam] Other (See Comments)    Severe respiratory depression, had to be masked in preop holding.  . Ciprofloxacin     REACTION: NAUSEA AND VOMITING  . Cyclobenzaprine Hcl   . Methocarbamol     REACTION: itching Patient currently  tolerating it as its on her home med list.  . Metronidazole   . Penicillins     REACTION: RASH   Medications Prior to Admission  Medication Sig Dispense Refill  . Calcium Citrate-Vitamin D (CITRACAL + D PO) Take 1 tablet by mouth daily. CA 1200mg -Vitamin D 1000iu      . Cholecalciferol (VITAMIN D-3 PO) Take 1 tablet by mouth daily.      . DULoxetine (CYMBALTA) 60 MG capsule Take 60 mg by mouth daily.      . fenofibrate 160 MG tablet Take 160 mg by mouth daily.      . fish oil-omega-3 fatty acids 1000 MG capsule Take 1 g by mouth 2 (two) times daily.        Marland Kitchen levETIRAcetam (KEPPRA) 500 MG tablet Take 1,500 mg by mouth 2 (two) times daily.       Marland Kitchen loperamide (IMODIUM) 2 MG capsule Take 2 capsules (4 mg total) by mouth as needed for diarrhea or loose stools. Maximum 16 mg daily  20 capsule  0  . meloxicam (MOBIC) 15 MG tablet Take 15 mg by mouth daily.      . methocarbamol (ROBAXIN) 500 MG tablet Take 500 mg by mouth every 8 (eight) hours as needed for muscle spasms.      Marland Kitchen  omeprazole (PRILOSEC) 20 MG capsule Take 20 mg by mouth daily.      . pregabalin (LYRICA) 150 MG capsule Take 150 mg by mouth 2 (two) times daily.       . promethazine (PHENERGAN) 25 MG tablet Take 25 mg by mouth every 6 (six) hours as needed for nausea (for severe headache).      . rosuvastatin (CRESTOR) 10 MG tablet Take 10 mg by mouth 3 (three) times a week. Monday, Wednesday, and friday      . traZODone (DESYREL) 50 MG tablet Take 50 mg by mouth at bedtime as needed for sleep.       . verapamil (CALAN) 80 MG tablet Take 80 mg by mouth 3 (three) times daily.        Home: Home Living Family/patient expects to be discharged to:: Private residence Living Arrangements: Spouse/significant other Available Help at Discharge: Family ("available most of the time" per husband) Type of Home: House Home Access: Stairs to enter [employer] of Steps: 1 Entrance Stairs-Rails: Right;Left;Can reach both [place]: One  [place]: [place] - 2 wheels;Bedside commode;Shower seat  Functional History:   Functional Status:  Mobility:     Ambulation/Gait Ambulation Distance (Feet): 80 Feet General Gait Details: Verbal cues to stand more erect initially.    ADL: ADL Eating/Feeding: Independent Where Assessed - Eating/Feeding: Chair Grooming: Set up;Supervision/safety Where Assessed - Grooming: Supported sitting Upper Body Bathing: Set up;Supervision/safety Where Assessed - Upper Body Bathing: Supported sitting Lower Body Bathing: Moderate assistance Where Assessed - Lower Body Bathing: Supported sit to stand Upper Body Dressing: Set up;Supervision/safety Where Assessed - Upper Body Dressing: Supported sitting Lower Body Dressing: Moderate assistance Where Assessed - Lower Body Dressing: Supported sit to Lobbyist: Minimal assistance Armed forces technical officer Method: Sit to Loss adjuster, chartered:  (Bed>recliner 2 feet away) Equipment Used: Gait belt;Rolling walker Transfers/Ambulation Related to ADLs: first time sit>stand Mod A without AD; second time sit>stand min A with RW;  Cognition: Cognition Overall Cognitive Status:  (decreased problem solving with getting OOB) Cognition Arousal/Alertness: Awake/alert Behavior During Therapy: WFL for tasks assessed/performed Overall Cognitive Status:  (decreased problem solving with getting OOB) Area of Impairment: Problem solving Problem Solving: Requires verbal cues;Requires tactile cues General Comments: Pt needed cues for sequencing for OOB.  Blood pressure 131/83, pulse 60, temperature 98.4 F (36.9 C), temperature source Oral, resp. rate 18, height 5\' 2"  (1.575 m), weight 97 kg (213 lb 13.5 oz), SpO2 96.00%. Physical Exam  Constitutional: She is oriented to person, place, and time. She appears well-developed and well-nourished.  HENT:  Head: Normocephalic and atraumatic.  Eyes: Conjunctivae and EOM are normal. Pupils are  equal, round, and reactive to light.  Neck: No JVD present. No tracheal deviation present. No thyromegaly present.  Cardiovascular: Normal rate.   Respiratory: Effort normal. No respiratory distress.  GI: She exhibits no distension.  Neurological: She is alert and oriented to person, place, and time. She has normal reflexes.  Strength 4+ to 5/5 in all 4's. [place]. No gross sensory issues. Processing perhaps a little delayed.   Psychiatric: She has a normal mood and affect. Her behavior is normal.    No results found for this or any previous visit (from the past 24 hour(s)). Mr Cervical Spine Wo Contrast  03/07/2013   CLINICAL DATA:  Weakness.  History of brain tumor.  EXAM: MRI CERVICAL SPINE WITHOUT CONTRAST  TECHNIQUE: Multiplanar, multisequence MR imaging was performed. No intravenous contrast was administered.  COMPARISON:  Cervical spine MRI 09/05/2010.  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 Normal alignment of the cervical vertebral bodies. They demonstrate normal marrow signal. No findings for osseous metastatic disease. The cervical spinal cord is grossly normal. No cord lesions, signal abnormality or syrinx.  Axial images are limited by motion but no obvious cervical disc protrusions, spinal or foraminal stenosis.  IMPRESSION: Limited examination due to patient motion.  No disc protrusions, spinal or foraminal stenosis.  No findings for osseous metastatic disease or spinal cord lesions.   Electronically Signed   By: Kalman Jewels M.D.   On: 03/07/2013 09:41   Mr Thoracic Spine Wo Contrast  03/07/2013   CLINICAL DATA:  Weakness.  History of brain tumor.  EXAM: MRI THORACIC SPINE WITHOUT CONTRAST  TECHNIQUE: Multiplanar, multisequence MR imaging was performed. No intravenous contrast was administered.  COMPARISON:  None.  FINDINGS: The thoracic vertebral bodies are normally aligned and demonstrate normal marrow signal. No findings for osseous metastatic  disease. The thoracic spinal cord is grossly normal despite motion artifact. No cord lesions or syrinx.  No thoracic disc protrusions, obvious spinal or foraminal stenosis despite motion artifact. No foraminal lesions.  IMPRESSION: Unremarkable thoracic spine MRI examination. No obvious cord lesions, spinal or foraminal stenosis. Exam is limited by patient motion.   Electronically Signed   By: Kalman Jewels M.D.   On: 03/07/2013 09:44   Dg Chest Portable 1 View  03/06/2013   CLINICAL DATA:  Fatigue  EXAM: PORTABLE CHEST - 1 VIEW  COMPARISON:  None available  FINDINGS: Lungs are clear. Heart size and mediastinal contours are within normal limits. No effusion. Visualized skeletal structures are unremarkable.  IMPRESSION: No acute cardiopulmonary disease.   Electronically Signed   By: Arne Cleveland M.D.   On: 03/06/2013 11:07    Assessment/Plan: Diagnosis: deconditioning related UTI, medical issues in setting of baseline frontal brain neoplasm 1. Does the need for close, 24 hr/day medical supervision in concert with the patient's rehab needs make it unreasonable for this patient to be served in a less intensive setting? No 2. Co-Morbidities requiring supervision/potential complications: see above 3. Due to bladder management, bowel management, safety, skin/wound care, disease management, medication administration, pain management and patient education, does the patient require 24 hr/day rehab nursing? No 4. Does the patient require coordinated care of a physician, rehab nurse, PT, OT to address physical and functional deficits in the context of the above medical diagnosis(es)? No Addressing deficits in the following areas: balance, endurance, locomotion and transferring 5. Can the patient actively participate in an intensive therapy program of at least 3 hrs of therapy per day at least 5 days per week? Potentially 6. The potential for patient to make measurable gains while on inpatient rehab is  fair 7. Anticipated functional outcomes upon discharge from inpatient rehab are n/a with PT, n/a with OT, n/a with SLP. 8. Estimated rehab length of stay to reach the above functional goals is: n/a 9. Does the patient have adequate social supports to accommodate these discharge functional goals? Potentially 10. Anticipated D/C setting: Home 11. Anticipated post D/C treatments: [employer] therapy 12. Overall Rehab/Functional Prognosis: good  RECOMMENDATIONS: This patient's condition is appropriate for continued rehabilitative care in the following setting: [facility] Patient has agreed to participate in recommended program. Yes Note that insurance prior authorization may be required for reimbursement for recommended care.  Comment: Home with HH therapies once medically appropriate.  Meredith Staggers, MD, [facility]      03/08/2013

## 2013-03-08 ENCOUNTER — Encounter: Payer: 59 | Admitting: Family Medicine

## 2013-03-08 DIAGNOSIS — C719 Malignant neoplasm of brain, unspecified: Secondary | ICD-10-CM

## 2013-03-08 DIAGNOSIS — R29898 Other symptoms and signs involving the musculoskeletal system: Secondary | ICD-10-CM

## 2013-03-08 DIAGNOSIS — R5381 Other malaise: Secondary | ICD-10-CM

## 2013-03-08 LAB — URINE CULTURE: Colony Count: >=100000

## 2013-03-08 MED ORDER — POLYETHYLENE GLYCOL 3350 17 G PO PACK: 17.0000 g | PACK | Freq: Every day | ORAL | Status: DC | PRN

## 2013-03-08 MED ORDER — SULFAMETHOXAZOLE-TMP DS 800-160 MG PO TABS: 1.0000 | ORAL_TABLET | Freq: Two times a day (BID) | ORAL | Status: DC

## 2013-03-08 NOTE — ED Provider Notes (Signed)
Medical screening examination/treatment/procedure(s) were performed by non-physician practitioner and as supervising physician I was immediately available for consultation/collaboration.     Veryl Speak, MD 03/08/13 [phone]

## 2013-03-08 NOTE — Progress Notes (Signed)
NURSING PROGRESS NOTE  [name] [id number] Discharge Data: 03/08/2013 1:33 PM Attending Provider: Jonetta Osgood, MD [id number] Birdie Riddle, MD     Andree Coss to be D/C'd Home per MD order.  Discussed with the patient the After Visit Summary and all questions fully answered. All IV's discontinued with no bleeding noted. All belongings returned to patient for patient to take home.   Last Vital Signs:  Blood pressure 131/83, pulse 60, temperature 98.4 F (36.9 C), temperature source Oral, resp. rate 18, height 5\' 2"  (1.575 m), weight 97 kg (213 lb 13.5 oz), SpO2 96.00%.  Discharge Medication List   Medication List    STOP taking these medications       verapamil 80 MG tablet  Commonly known as:  CALAN      TAKE these medications       CITRACAL + D PO  Take 1 tablet by mouth daily. CA 1200mg -Vitamin D 1000iu     DULoxetine 60 MG capsule  Commonly known as:  CYMBALTA  Take 60 mg by mouth daily.     fenofibrate 160 MG tablet  Take 160 mg by mouth daily.     fish oil-omega-3 fatty acids 1000 MG capsule  Take 1 g by mouth 2 (two) times daily.     KEPPRA 500 MG tablet  Generic drug:  levETIRAcetam  Take 1,500 mg by mouth 2 (two) times daily.     loperamide 2 MG capsule  Commonly known as:  IMODIUM  Take 2 capsules (4 mg total) by mouth as needed for diarrhea or loose stools. Maximum 16 mg daily     meloxicam 15 MG tablet  Commonly known as:  MOBIC  Take 15 mg by mouth daily.     methocarbamol 500 MG tablet  Commonly known as:  ROBAXIN  Take 500 mg by mouth every 8 (eight) hours as needed for muscle spasms.     omeprazole 20 MG capsule  Commonly known as:  PRILOSEC  Take 20 mg by mouth daily.     polyethylene glycol packet  Commonly known as:  MIRALAX / GLYCOLAX  Take 17 g by mouth daily as needed for mild constipation.     pregabalin 150 MG capsule  Commonly known as:  LYRICA  Take 150 mg by mouth 2 (two) times daily.     promethazine 25 MG tablet   Commonly known as:  PHENERGAN  Take 25 mg by mouth every 6 (six) hours as needed for nausea (for severe headache).     rosuvastatin 10 MG tablet  Commonly known as:  CRESTOR  Take 10 mg by mouth 3 (three) times a week. Monday, Wednesday, and friday     sulfamethoxazole-trimethoprim 800-160 MG per tablet  Commonly known as:  BACTRIM DS  Take 1 tablet by mouth 2 (two) times daily.     traZODone 50 MG tablet  Commonly known as:  DESYREL  Take 50 mg by mouth at bedtime as needed for sleep.     VITAMIN D-3 PO  Take 1 tablet by mouth daily.

## 2013-03-08 NOTE — Discharge Instructions (Signed)
Your Amitriptyline has been discontinued due to hypotension.  Please see Dr. Rock Nephew in the near term(2-3 days) for Headache prevention medication.  Until you see Dr. Rock Nephew take Tylenol 1000 mg or Ibuprofen 800 mg if you have a headache.  Trimethoprim sulfate has been prescribed for your UTI.

## 2013-03-08 NOTE — Progress Notes (Addendum)
Physical Therapy Treatment Patient Details Name: [name] MRN: [id number] DOB: 28-Jan-1955 Today's Date: 03/08/2013 Time: 5701-7793 PT Time Calculation (min): 17 min  PT Assessment / Plan / Recommendation  History of Present Illness Weakness. 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 History of seizures, recurrence of brain tumor in 2005 status post resection, history of migraines, seizures, fibromyalgia who presents to the ED with several months of generalized lower extremity weakness since October/November of 2014, 3 week history of intermittent diarrhea and dragging of the right there greater than the left. Patient endorses falls.   PT Comments   Pt making good progress. CIR recommending HH therapies.  Follow Up Recommendations  Home health PT;Supervision for mobility/OOB     Does the patient have the potential to tolerate intense rehabilitation     Barriers to Discharge        Equipment Recommendations  None recommended by PT    Recommendations for Other Services    Frequency Min 3X/week   Progress towards PT Goals Progress towards PT goals: Progressing toward goals  Plan Discharge plan needs to be updated    Precautions / Restrictions Precautions Precautions: Fall Restrictions Weight Bearing Restrictions: No   Pertinent Vitals/Pain No c/o's    Mobility  Bed Mobility Overal bed mobility: Needs Assistance Bed Mobility: Sit to Supine Sit to supine: Supervision General bed mobility comments: Incr time to perform. Transfers Overall transfer level: Needs assistance Equipment used: Rolling walker (2 wheeled) Transfers: Sit to/from Stand Sit to Stand: Min guard General transfer comment: Verbal cues for hand placement. Ambulation/Gait Ambulation/Gait assistance: Min assist Ambulation Distance (Feet): 170 Feet Assistive device: Rolling walker (2 wheeled) Gait Pattern/deviations:  Step-through pattern;Decreased stride length Gait velocity interpretation: Below normal speed for age/gender General Gait Details: Assist for balance.    Exercises     PT Diagnosis:    PT Problem List:   PT Treatment Interventions:     PT Goals (current goals can now be found in the care plan section)    Visit Information  Last PT Received On: 03/08/13 Assistance Needed: +1 History of Present Illness: Weakness. 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 History of seizures, recurrence of brain tumor in 2005 status post resection, history of migraines, seizures, fibromyalgia who presents to the ED with several months of generalized lower extremity weakness since October/November of 2014, 3 week history of intermittent diarrhea and dragging of the right there greater than the left. Patient endorses falls.    Subjective Data      Cognition  Cognition Arousal/Alertness: Awake/alert Behavior During Therapy: WFL for tasks assessed/performed Area of Impairment: Problem solving Problem Solving: Requires verbal cues;Requires tactile cues General Comments: Cues for hand placement for sit to stand.    Balance  Balance Sitting-balance support: No upper extremity supported Sitting balance-Leahy Scale: Good Standing balance support: Bilateral upper extremity supported Standing balance-Leahy Scale: Poor  End of Session PT - End of Session Equipment Utilized During Treatment: Gait belt Activity Tolerance: Patient tolerated treatment well Patient left: in bed;with call bell/phone within reach;with bed alarm set Nurse Communication: Mobility status   GP     [facility] 03/08/2013, 10:37 AM  Suanne Marker PT [phone]

## 2013-03-08 NOTE — Care Management Note (Signed)
    Page 1 of 1   03/08/2013     11:12:39 AM   CARE MANAGEMENT NOTE 03/08/2013  Patient:  [name], [name]   Account Number:  [id number]  Date Initiated:  03/08/2013  Documentation initiated by:  Tomi Bamberger  Subjective/Objective Assessment:   dx uti  admit- lives with spouse.     Action/Plan:   Anticipated DC Date:  03/08/2013   Anticipated DC Plan:  [place]  CM consult      [facility]  HOME HEALTH   Choice offered to / List presented to:  C-1 Patient        [place] arranged  [place]   Status of service:  Completed, signed off Medicare Important Message given?   (If response is "NO", the following Medicare IM given date fields will be blank) Date Medicare IM given:   Date Additional Medicare IM given:    Discharge Disposition:  [place]  Per UR Regulation:    If discussed at Long Length of Stay Meetings, dates discussed:    Comments:  03/08/13 1107 Tomi Bamberger RN,BSN [phone] patient lives with spouse, patient will need hhpt/ot. Patient chose [place], referral givent Luvenia Starch notified,  for hhpt/ot, soc will begin 24-48 hrs post discharge.   Patient is for dc today.

## 2013-03-08 NOTE — Discharge Summary (Signed)
Physician Discharge Summary  [name] WUX:[id number] DOB: 1955/03/24 DOA: 03/06/2013  PCP: Annye Asa, MD  Admit date: 03/06/2013 Discharge date: 03/08/2013  Time spent: 45 minutes  Recommendations for Outpatient Follow-up:   Amitriptyline has been discontinued due to hypotension.  Patient needs recommendation for headache prophylaxis.  This decision has been deferred to the patient's outpatient neurologist who will continue to follow her.   TMP - to complete antibiotic therapy for UTI.  Diarrhea has resolved.  Patient will receive PT / [place].  Discharge Diagnoses:  Principal Problem:   Weakness of both legs Active Problems:   Malignant neoplasm of frontal lobe of brain   HYPERLIPIDEMIA   DEPRESSIVE DISORDER   Migraine   UTI (urinary tract infection)   Hypotension   UTI (lower urinary tract infection)   Anemia   Weakness   Discharge Condition: stable.  Diet recommendation: heart healthy.  Filed Weights   03/06/13 1015 03/08/13 0506  Weight: 92.987 kg (205 lb) 97 kg (213 lb 13.5 oz)    History of present illness at the time of admission:  [name] is a 58 y.o. 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 History of seizures, recurrence of brain tumor in 2005 status post resection, history of migraines, seizures, fibromyalgia who presents to the ED with several months of generalized lower extremity weakness (R>L) since October/November of 2014, and a 3 week history of intermittent diarrhea. Patient's husband states that couldn't get the patient out of bed this morning secondary to significant weakness. EMS was subsequently called. Per husband when EMS got the patient's pressures were in the 80s and patient was given a fluid bolus. Her blood pressures improved with IVF ED physician. Patient stated that she had similar episodes recently which transiently resolved.  Patient's husband states that the weakness has been progressive and worsened over the past 3 weeks with some associated falls and some incontinence.   Hospital Course:   Weakness of both legs  - Suspect this is multifactorial 2/2 to hypotension/UTI/diarrhea/deconditioning, - MRI of the cervical/thoracic spine negative.  - PT recommended Home Health Physical and Occupational Therapy. - Neurology was consulted.  No further recommendations. - Patient was able to walk in the hallway with assistance on 3/2.  UTI (urinary tract infection)  - no urinary symptoms today  - UA positive for nitrites and moderate leukocytes.  Cultures shows > 100,000 colonies of ENTEROBACTER CLOACAE - Treated with IV rocephin inpatient.  She will finish her antibiotic therapy on TMP x 2.5 more days. (patient allergic to cipro)  Malignant neoplasm of frontal lobe of brain  - stable  - last MRI done in 12/2012 was clear  - follows at [facility]  - stable  - c/w Cymbalta   Migraine  - stable no current symptoms. - discontinue verapamil due to hypotension - Rec Tylenol +/- Ibuprofen until she can follow with Dr. Rock Nephew for migraine prophylaxis.  Hypotension  - likely secondary to intermittent diarrhea, UTI and Verapamil. - BP responded to IV fluids  - D/C'd verapamil.  Normocytic Anemia  - likely secondary to anemia of chronic disease   Seizure Disorder  -c/w Keppra   Consultations: Neurology.  Discharge Exam: Filed Vitals:   03/08/13 0506  BP: 131/83  Pulse: 60  Temp: 98.4 F (36.9 C)  Resp: 18  General: Well developed, well  nourished, NAD, appears stated age.  Calm sitting in chair. HEENT: EOMI, Anicteic Sclera  Neck: Supple, no masses  Cardiovascular: RRR, S1 S2 auscultated, no rubs, murmurs or gallops.  Respiratory: Clear to auscultation bilaterally with equal chest rise, no rales, rhonchi or wheezes  Abdomen: Soft, nontender, nondistended,  + bowel sounds  Extremities: warm dry without cyanosis clubbing or edema.  Neuro: AAOx3, Strength 4/5 in upper and lower extremities  Skin: Without rashes exudates or nodules.  Psych: Normal affect and demeanor, mild confusion   Discharge Instructions      Discharge Orders   Future Orders Complete By Expires   Diet - low sodium heart healthy  As directed    Increase activity slowly  As directed        Medication List    STOP taking these medications       verapamil 80 MG tablet  Commonly known as:  CALAN      TAKE these medications       CITRACAL + D PO  Take 1 tablet by mouth daily. CA 1200mg -Vitamin D 1000iu     DULoxetine 60 MG capsule  Commonly known as:  CYMBALTA  Take 60 mg by mouth daily.     fenofibrate 160 MG tablet  Take 160 mg by mouth daily.     fish oil-omega-3 fatty acids 1000 MG capsule  Take 1 g by mouth 2 (two) times daily.     KEPPRA 500 MG tablet  Generic drug:  levETIRAcetam  Take 1,500 mg by mouth 2 (two) times daily.     loperamide 2 MG capsule  Commonly known as:  IMODIUM  Take 2 capsules (4 mg total) by mouth as needed for diarrhea or loose stools. Maximum 16 mg daily     meloxicam 15 MG tablet  Commonly known as:  MOBIC  Take 15 mg by mouth daily.     methocarbamol 500 MG tablet  Commonly known as:  ROBAXIN  Take 500 mg by mouth every 8 (eight) hours as needed for muscle spasms.     omeprazole 20 MG capsule  Commonly known as:  PRILOSEC  Take 20 mg by mouth daily.     polyethylene glycol packet  Commonly known as:  MIRALAX / GLYCOLAX  Take 17 g by mouth daily as needed for mild constipation.     pregabalin 150 MG capsule  Commonly known as:  LYRICA  Take 150 mg by mouth 2 (two) times daily.     promethazine 25 MG tablet  Commonly known as:  PHENERGAN  Take 25 mg by mouth every 6 (six) hours as needed for nausea (for severe headache).     rosuvastatin 10 MG tablet  Commonly known as:  CRESTOR  Take 10 mg by mouth 3  (three) times a week. Monday, Wednesday, and friday     sulfamethoxazole-trimethoprim 800-160 MG per tablet  Commonly known as:  BACTRIM DS  Take 1 tablet by mouth 2 (two) times daily.     traZODone 50 MG tablet  Commonly known as:  DESYREL  Take 50 mg by mouth at bedtime as needed for sleep.     VITAMIN D-3 PO  Take 1 tablet by mouth daily.       Allergies  Allergen Reactions  . Versed [Midazolam] Other (See Comments)    Severe respiratory depression, had to be masked in preop holding.  . Ciprofloxacin     REACTION: NAUSEA AND VOMITING  . Cyclobenzaprine Hcl   . Methocarbamol  REACTION: itching Patient currently tolerating it as its on her home med list.  . Metronidazole   . Penicillins     REACTION: RASH   Follow-up Information   Follow up with Annye Asa, MD In 1 week.   Specialty:  Family Medicine   Contact information:   [id number] [place] [address] [phone]       Follow up with WALLS, Lucilla Lame, MD In 2 days. (regarding Migraine headache prevention.)    Specialty:  Physician Assistant   Contact information:   [address] [place] [address] [phone]        The results of significant diagnostics from this hospitalization (including imaging, microbiology, ancillary and laboratory) are listed below for reference.    Significant Diagnostic Studies: Mr Cervical Spine Wo Contrast  03/07/2013   CLINICAL DATA:  Weakness.  History of brain tumor.  EXAM: MRI CERVICAL SPINE WITHOUT CONTRAST  TECHNIQUE: Multiplanar, multisequence MR imaging was performed. No intravenous contrast was administered.  COMPARISON:  Cervical spine MRI 09/05/2010.  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 Normal alignment of the cervical vertebral bodies. They demonstrate normal marrow signal. No findings for osseous metastatic disease. The cervical spinal cord is grossly normal. No cord lesions, signal abnormality or  syrinx.  Axial images are limited by motion but no obvious cervical disc protrusions, spinal or foraminal stenosis.  IMPRESSION: Limited examination due to patient motion.  No disc protrusions, spinal or foraminal stenosis.  No findings for osseous metastatic disease or spinal cord lesions.   Electronically Signed   By: Kalman Jewels M.D.   On: 03/07/2013 09:41   Mr Thoracic Spine Wo Contrast  03/07/2013   CLINICAL DATA:  Weakness.  History of brain tumor.  EXAM: MRI THORACIC SPINE WITHOUT CONTRAST  TECHNIQUE: Multiplanar, multisequence MR imaging was performed. No intravenous contrast was administered.  COMPARISON:  None.  FINDINGS: The thoracic vertebral bodies are normally aligned and demonstrate normal marrow signal. No findings for osseous metastatic disease. The thoracic spinal cord is grossly normal despite motion artifact. No cord lesions or syrinx.  No thoracic disc protrusions, obvious spinal or foraminal stenosis despite motion artifact. No foraminal lesions.  IMPRESSION: Unremarkable thoracic spine MRI examination. No obvious cord lesions, spinal or foraminal stenosis. Exam is limited by patient motion.   Electronically Signed   By: Kalman Jewels M.D.   On: 03/07/2013 09:44   Dg Chest Portable 1 View  03/06/2013   CLINICAL DATA:  Fatigue  EXAM: PORTABLE CHEST - 1 VIEW  COMPARISON:  None available  FINDINGS: Lungs are clear. Heart size and mediastinal contours are within normal limits. No effusion. Visualized skeletal structures are unremarkable.  IMPRESSION: No acute cardiopulmonary disease.   Electronically Signed   By: Arne Cleveland M.D.   On: 03/06/2013 11:07    Microbiology: Recent Results (from the past 240 hour(s))  CLOSTRIDIUM DIFFICILE BY PCR     Status: None   Collection Time    03/03/13  2:40 PM      Result Value Ref Range Status   C difficile by pcr NEGATIVE  NEGATIVE Final  STOOL CULTURE     Status: None   Collection Time    03/03/13  2:40 PM      Result Value Ref Range  Status   Specimen Description STOOL   Final   Special Requests NONE   Final   Culture     Final   Value: NO SALMONELLA, SHIGELLA, CAMPYLOBACTER, YERSINIA,  OR E.COLI 0157:H7 ISOLATED     Performed at [facility]   Report Status 03/07/2013 FINAL   Final  URINE CULTURE     Status: None   Collection Time    03/06/13  1:10 PM      Result Value Ref Range Status   Specimen Description URINE, CATHETERIZED   Final   Special Requests NONE   Final   Culture  Setup Time     Final   Value: 03/06/2013 19:17     Performed at [facility] Count     Final   Value: >=100,000 COLONIES/ML     Performed at [facility]   Culture     Final   Value: ENTEROBACTER CLOACAE     Performed at [facility]   Report Status 03/08/2013 FINAL   Final   Organism ID, Bacteria ENTEROBACTER CLOACAE   Final  CULTURE, BLOOD (ROUTINE X 2)     Status: None   Collection Time    03/06/13  4:22 PM      Result Value Ref Range Status   Specimen Description BLOOD LEFT ARM   Final   Special Requests BOTTLES DRAWN AEROBIC AND ANAEROBIC 5CC EACH   Final   Culture  Setup Time     Final   Value: 03/06/2013 20:40     Performed at [facility]   Culture     Final   Value:        BLOOD CULTURE RECEIVED NO GROWTH TO DATE CULTURE WILL BE HELD FOR 5 DAYS BEFORE ISSUING A FINAL NEGATIVE REPORT     Performed at [facility]   Report Status PENDING   Incomplete  CULTURE, BLOOD (ROUTINE X 2)     Status: None   Collection Time    03/06/13  4:26 PM      Result Value Ref Range Status   Specimen Description BLOOD RIGHT HAND   Final   Special Requests BOTTLES DRAWN AEROBIC ONLY 5CC   Final   Culture  Setup Time     Final   Value: 03/06/2013 20:40     Performed at [facility]   Culture     Final   Value:        BLOOD CULTURE RECEIVED NO GROWTH TO DATE CULTURE WILL BE HELD FOR 5 DAYS BEFORE ISSUING A FINAL NEGATIVE REPORT     Performed at [facility]   Report  Status PENDING   Incomplete     Labs: Basic Metabolic Panel:  Recent Labs Lab 03/03/13 1226 03/06/13 1129 03/06/13 1622 03/07/13 0602  NA 142 142  --  142  K 4.4 4.3  --  4.0  CL 104 106  --  110  CO2 24 21  --  23  GLUCOSE 76 87  --  84  BUN 17 12  --  9  CREATININE 1.13* 0.98 0.96 0.85  CALCIUM 9.6 9.1  --  8.5  MG  --   --  1.8  --    Liver Function Tests:  Recent Labs Lab 03/03/13 1226 03/06/13 1129  AST 21 20  ALT 11 10  ALKPHOS 58 44  BILITOT 0.3 0.3  PROT 7.7 6.8  ALBUMIN 3.8 3.2*   CBC:  Recent Labs Lab 03/03/13 1226 03/06/13 1129 03/06/13 1622 03/07/13 0602  WBC 6.6 4.0 2.9* 2.5*  NEUTROABS 4.5 2.4  --   --   HGB 11.3* 10.6* 9.4* 9.2*  HCT 35.2* 33.0* 28.7* 28.3*  MCV 88.4 88.9 88.0 88.7  PLT 263 216 207 199   Cardiac Enzymes:  Recent Labs Lab 03/03/13 1226 03/06/13 1622 03/06/13 2156 03/07/13 0602  TROPONINI <0.30 <0.30 <0.30 <0.30    Signed:  Meiling Kitchens [phone]  [facility] 03/08/2013, 10:49 AM    Attending Patient seen and examined, agree with the above assessment an plan.Much better, ambulated with PT yesterday, much improved. Stable for discharge-home with HHPT. Neuro has no further recommendations at this time.  Nena Alexander MD

## 2013-03-09 ENCOUNTER — Other Ambulatory Visit: Payer: Self-pay | Admitting: Family Medicine

## 2013-03-09 ENCOUNTER — Telehealth: Payer: Self-pay | Admitting: Family Medicine

## 2013-03-09 NOTE — Telephone Encounter (Signed)
Sreekesh from [facility] called to inform us about patient's PT is twice a week for 3 weeks and OT will be there tomorrow to evaluate patient.     Contact [phone]

## 2013-03-09 NOTE — Telephone Encounter (Signed)
Ok for verbal 

## 2013-03-09 NOTE — Telephone Encounter (Signed)
Verbal ok given.

## 2013-03-09 NOTE — Telephone Encounter (Signed)
Ok- I'm guessing this was ordered by hospital at time of D/C

## 2013-03-09 NOTE — Telephone Encounter (Signed)
Med filled.  

## 2013-03-09 NOTE — Telephone Encounter (Signed)
Noted cannot call due to incomplete phone number. Will give verbal ok if called back.

## 2013-03-10 ENCOUNTER — Telehealth: Payer: Self-pay

## 2013-03-10 NOTE — Telephone Encounter (Signed)
Patient states that she went to the [facility] ED on 2/26 with weakness and fatigue.  She was treated and then sent home on the same day.  She then went back on 3/1 with similar symptoms (weakness in both legs, fatigue, and headache).  Patient was admitted and treated for a UTI and bilateral leg weakness.  She was discharged on 3/3 and was encouraged to follow up with her provider.  Patients states that weakness is not a new onset, but has been going on for the past 5-6 years.  Patient stated she was currently asymptomatic for UTI, but continues to experience bilateral leg weakness.  States that husband had to assist her with walking today.    Medication and allergies:  Reviewed and updated Local pharmacy:  [facility], [place] - [address] Immunizations due:  Tetanus/Tdap No changes to personal, family history or past surgical hx  Hospital follow-up appt scheduled for 03/16/13 @ 11:15 am.

## 2013-03-12 LAB — CULTURE, BLOOD (ROUTINE X 2)
CULTURE: NO GROWTH
Culture: NO GROWTH

## 2013-03-14 ENCOUNTER — Telehealth: Payer: Self-pay | Admitting: Family Medicine

## 2013-03-14 NOTE — Telephone Encounter (Signed)
Elenore Rota from [facility] called. He saw [name] last week for OT. He will be seeing her twice this week and after that once a week for 3 weeks. This will include home exercise transfers functional mobility, adaptive equipment training ADL, and balance training.    Eustace Pen [phone]

## 2013-03-14 NOTE — Telephone Encounter (Signed)
Ok.  Is an order needed?

## 2013-03-14 NOTE — Telephone Encounter (Signed)
Noted  

## 2013-03-16 ENCOUNTER — Ambulatory Visit (INDEPENDENT_AMBULATORY_CARE_PROVIDER_SITE_OTHER): Payer: 59 | Admitting: Family Medicine

## 2013-03-16 ENCOUNTER — Encounter: Payer: Self-pay | Admitting: Family Medicine

## 2013-03-16 VITALS — BP 120/80 | HR 80 | Temp 98.1°F | Resp 16 | Wt 202.4 lb

## 2013-03-16 DIAGNOSIS — N390 Urinary tract infection, site not specified: Secondary | ICD-10-CM

## 2013-03-16 DIAGNOSIS — G43909 Migraine, unspecified, not intractable, without status migrainosus: Secondary | ICD-10-CM

## 2013-03-16 DIAGNOSIS — I959 Hypotension, unspecified: Secondary | ICD-10-CM

## 2013-03-16 DIAGNOSIS — R29898 Other symptoms and signs involving the musculoskeletal system: Secondary | ICD-10-CM

## 2013-03-16 NOTE — Assessment & Plan Note (Signed)
Both of pt's migraine prophylaxis meds (amitriptylline and verapamil) were stopped during her hospitalization.  Pt to f/u w/ neuro to discuss ongoing management.

## 2013-03-16 NOTE — Assessment & Plan Note (Signed)
Pt completed UTI.  Will get UA for ToC.

## 2013-03-16 NOTE — Assessment & Plan Note (Signed)
Ongoing issue for pt.  Currently receiving HH PT/OT.  Encouraged her to discuss this w/ Neurology as she may need a nerve conduction test done to determine possible cause.  Will follow.

## 2013-03-16 NOTE — Patient Instructions (Signed)
Follow up in 6-8 weeks to monitor your progress We'll notify you of your urine and make any changes if needed Keep up the good (hard) work on PT Follow up with Neuro as scheduled- please make sure you address your leg weakness Call with any questions or concerns Hang in there!!!

## 2013-03-16 NOTE — Progress Notes (Signed)
   Subjective:    Patient ID: [name], female    DOB: 1955-03-27, 58 y.o.   MRN: [id number]  [facility] f/u- pt was admitted on 3/1 after going to the hospital on 2/26 w/ dehydration and low BP.  BP improved w/ IV fluids and was sent home.  On 3/1 again had same sxs but were worse than previous and was unable to get out of bed due to leg weakness.  Was admitted and found to have UTI.  Completed course of Bactrim after D/C (3/3).  Pt reports feeling better than when hospitalized but not back to normal.  Stopped Verapamil and Amitriptyline in hospital.  Now getting PT/OT via [facility] Alvis Lemmings).  Has appt upcoming w/ neuro Matilde Bash) to discuss new migraine meds.  Pt reports she is dragging her R leg.   Review of Systems For ROS see HPI     Objective:   Physical Exam  Vitals reviewed. Constitutional: She is oriented to person, place, and time. She appears well-developed and well-nourished. No distress.  HENT:  Head: Normocephalic.  Cardiovascular: Normal rate, regular rhythm, normal heart sounds and intact distal pulses.   Abdominal: Soft. Bowel sounds are normal. She exhibits no distension. There is no tenderness. There is no rebound and no guarding.  Musculoskeletal: She exhibits tenderness (over multiple trigger points including R lower leg).  Full ROM of R ankle, no obvious weakness on PE today  Neurological: She is alert and oriented to person, place, and time. No cranial nerve deficit. Coordination normal.  Psychiatric:  Flat affect, withdrawn          Assessment & Plan:

## 2013-03-16 NOTE — Assessment & Plan Note (Signed)
Improved w/ resolution of UTI and d/c of amitriptyline and verapamil.  Currently asymptomatic.  Will follow.

## 2013-03-16 NOTE — Progress Notes (Signed)
Pre visit review using our clinic review tool, if applicable. No additional management support is needed unless otherwise documented below in the visit note. 

## 2013-03-17 ENCOUNTER — Telehealth: Payer: Self-pay | Admitting: General Practice

## 2013-03-17 NOTE — Telephone Encounter (Signed)
Home health certification received for pt. Filled out by [facility] and faxed back. Placed in JG's green folder for further processing.

## 2013-03-20 ENCOUNTER — Other Ambulatory Visit: Payer: Self-pay | Admitting: Family Medicine

## 2013-03-20 ENCOUNTER — Encounter: Payer: Self-pay | Admitting: Family Medicine

## 2013-03-21 MED ORDER — ROSUVASTATIN CALCIUM 10 MG PO TABS: 10.0000 mg | ORAL_TABLET | ORAL | Status: DC

## 2013-03-21 NOTE — Telephone Encounter (Signed)
Med filled.  

## 2013-03-21 NOTE — Telephone Encounter (Signed)
Med already filled today.

## 2013-03-22 NOTE — Telephone Encounter (Signed)
Pt has an active rx with 3 refills at [facility].

## 2013-03-23 DIAGNOSIS — Z85841 Personal history of malignant neoplasm of brain: Secondary | ICD-10-CM

## 2013-03-23 DIAGNOSIS — M6281 Muscle weakness (generalized): Secondary | ICD-10-CM

## 2013-03-23 DIAGNOSIS — R262 Difficulty in walking, not elsewhere classified: Secondary | ICD-10-CM

## 2013-03-23 NOTE — Telephone Encounter (Signed)
Paperwork faxed same day. JG//CMA

## 2013-03-24 LAB — POCT URINALYSIS DIPSTICK
Bilirubin, UA: NEGATIVE
Blood, UA: NEGATIVE
Glucose, UA: NEGATIVE
Ketones, UA: NEGATIVE
Leukocytes, UA: NEGATIVE
Nitrite, UA: NEGATIVE
Protein, UA: NEGATIVE
Spec Grav, UA: 1.01
Urobilinogen, UA: 0.2
pH, UA: 6.5

## 2013-03-28 ENCOUNTER — Encounter: Payer: Self-pay | Admitting: Family Medicine

## 2013-03-28 MED ORDER — ROSUVASTATIN CALCIUM 10 MG PO TABS: ORAL_TABLET | ORAL | Status: DC

## 2013-03-28 NOTE — Telephone Encounter (Signed)
Med filled.  

## 2013-04-06 DIAGNOSIS — I959 Hypotension, unspecified: Secondary | ICD-10-CM

## 2013-04-06 DIAGNOSIS — N390 Urinary tract infection, site not specified: Secondary | ICD-10-CM

## 2013-04-06 DIAGNOSIS — R29898 Other symptoms and signs involving the musculoskeletal system: Secondary | ICD-10-CM

## 2013-04-06 DIAGNOSIS — G43909 Migraine, unspecified, not intractable, without status migrainosus: Secondary | ICD-10-CM

## 2013-05-04 ENCOUNTER — Encounter: Payer: Self-pay | Admitting: Family Medicine

## 2013-05-04 ENCOUNTER — Ambulatory Visit (INDEPENDENT_AMBULATORY_CARE_PROVIDER_SITE_OTHER): Payer: 59 | Admitting: Family Medicine

## 2013-05-04 VITALS — BP 130/88 | HR 87 | Temp 98.2°F | Resp 16 | Wt 205.5 lb

## 2013-05-04 DIAGNOSIS — R29898 Other symptoms and signs involving the musculoskeletal system: Secondary | ICD-10-CM

## 2013-05-04 DIAGNOSIS — G43909 Migraine, unspecified, not intractable, without status migrainosus: Secondary | ICD-10-CM

## 2013-05-04 MED ORDER — PROMETHAZINE HCL 25 MG/ML IJ SOLN
25.0000 mg | Freq: Once | INTRAMUSCULAR | Status: AC
  Administered 2013-05-04: 25 mg via INTRAMUSCULAR

## 2013-05-04 MED ORDER — KETOROLAC TROMETHAMINE 60 MG/2ML IM SOLN
60.0000 mg | Freq: Once | INTRAMUSCULAR | Status: AC
  Administered 2013-05-04: 60 mg via INTRAMUSCULAR

## 2013-05-04 NOTE — Progress Notes (Signed)
Pre visit review using our clinic review tool, if applicable. No additional management support is needed unless otherwise documented below in the visit note. 

## 2013-05-04 NOTE — Progress Notes (Signed)
   Subjective:    Patient ID: [name], female    DOB: 12-08-1955, 58 y.o.   MRN: [id number]  HPI Migraine- pt reports she is now seeing someone [place] [facility]) about Botox injxns.  Has been told she is no longer to take Excedrin Migraine.  Pt reports she is to take phenergan and 'lie down' when she develops a headache.  Pt has HA today but hasn't taken today.  Did take Methocarbamol prior to arrival.  Lower extremity weakness- pt was getting home health PT but is no longer.  Pt found it helpful.  Pt not interested in going to outpt PT, 'I don't know how I'm going to feel'.   Review of Systems For ROS see HPI     Objective:   Physical Exam  Vitals reviewed. Constitutional: She is oriented to person, place, and time. She appears well-developed and well-nourished. No distress.  HENT:  Head: Normocephalic and atraumatic.  TMs WNL No TTP over sinuses Minimal nasal congestion  Eyes: Conjunctivae and EOM are normal. Pupils are equal, round, and reactive to light.  Neck: Normal range of motion. Neck supple.  Cardiovascular: Normal rate, regular rhythm, normal heart sounds and intact distal pulses.   Pulmonary/Chest: Effort normal and breath sounds normal. No respiratory distress. She has no wheezes. She has no rales.  Lymphadenopathy:    She has no cervical adenopathy.  Neurological: She is alert and oriented to person, place, and time. She has normal reflexes. No cranial nerve deficit. Coordination normal.  Psychiatric: Her behavior is normal.  Flat affect          Assessment & Plan:

## 2013-05-04 NOTE — Patient Instructions (Signed)
Schedule your complete physical in 3-4 months Call and let your neurologist know that you had a migraine today and required a phenergan and toradol injection in the office Please re-consider physical therapy as you found this helpful before! Call with any questions or concerns Hang in there!!!

## 2013-05-08 NOTE — Assessment & Plan Note (Signed)
Recurrent issue for pt.  Encouraged her to discuss ongoing migraines w/ neuro.  Pt given phenergan and toradol injxns in office today.

## 2013-05-08 NOTE — Assessment & Plan Note (Signed)
Pt feels she was deriving benefit from [facility] PT but is not interested in continuing.  When asked why, pt unable to elaborate or explain.  Will continue to follow.

## 2013-05-12 ENCOUNTER — Encounter: Payer: Self-pay | Admitting: Family Medicine

## 2013-05-13 MED ORDER — FERROUS SULFATE 325 (65 FE) MG PO TABS: 325.0000 mg | ORAL_TABLET | Freq: Every day | ORAL | Status: DC

## 2013-05-13 NOTE — Telephone Encounter (Signed)
Med filled.  

## 2013-05-14 ENCOUNTER — Encounter: Payer: Self-pay | Admitting: Family Medicine

## 2013-05-16 ENCOUNTER — Encounter: Payer: Self-pay | Admitting: Family Medicine

## 2013-05-16 ENCOUNTER — Other Ambulatory Visit: Payer: Self-pay | Admitting: General Practice

## 2013-05-16 ENCOUNTER — Other Ambulatory Visit: Payer: Self-pay | Admitting: Family Medicine

## 2013-05-16 MED ORDER — FERROUS SULFATE 325 (65 FE) MG PO TABS: 325.0000 mg | ORAL_TABLET | Freq: Every day | ORAL | Status: DC

## 2013-05-16 NOTE — Telephone Encounter (Signed)
Med filled.  

## 2013-06-04 ENCOUNTER — Emergency Department (HOSPITAL_COMMUNITY)
Admission: EM | Admit: 2013-06-04 | Discharge: 2013-06-04 | Disposition: A | Discharge status: Home / Self Care | Payer: 59 | Attending: Emergency Medicine | Admitting: Emergency Medicine

## 2013-06-04 ENCOUNTER — Emergency Department (HOSPITAL_COMMUNITY): Payer: 59

## 2013-06-04 ENCOUNTER — Encounter (HOSPITAL_COMMUNITY): Payer: Self-pay | Admitting: Emergency Medicine

## 2013-06-04 DIAGNOSIS — Z79899 Other long term (current) drug therapy: Secondary | ICD-10-CM | POA: Insufficient documentation

## 2013-06-04 DIAGNOSIS — N200 Calculus of kidney: Secondary | ICD-10-CM

## 2013-06-04 DIAGNOSIS — N390 Urinary tract infection, site not specified: Secondary | ICD-10-CM | POA: Insufficient documentation

## 2013-06-04 DIAGNOSIS — K8000 Calculus of gallbladder with acute cholecystitis without obstruction: Secondary | ICD-10-CM | POA: Insufficient documentation

## 2013-06-04 DIAGNOSIS — G43909 Migraine, unspecified, not intractable, without status migrainosus: Secondary | ICD-10-CM | POA: Insufficient documentation

## 2013-06-04 DIAGNOSIS — D649 Anemia, unspecified: Secondary | ICD-10-CM | POA: Insufficient documentation

## 2013-06-04 DIAGNOSIS — G40909 Epilepsy, unspecified, not intractable, without status epilepticus: Secondary | ICD-10-CM | POA: Insufficient documentation

## 2013-06-04 DIAGNOSIS — Z880 Allergy status to penicillin: Secondary | ICD-10-CM | POA: Insufficient documentation

## 2013-06-04 DIAGNOSIS — Z85841 Personal history of malignant neoplasm of brain: Secondary | ICD-10-CM | POA: Insufficient documentation

## 2013-06-04 DIAGNOSIS — K8020 Calculus of gallbladder without cholecystitis without obstruction: Secondary | ICD-10-CM

## 2013-06-04 LAB — CBC WITH DIFFERENTIAL/PLATELET
Basophils Absolute: 0 10*3/uL (ref 0.0–0.1)
Basophils Relative: 0 % (ref 0–1)
EOS ABS: 0.2 10*3/uL (ref 0.0–0.7)
Eosinophils Relative: 2 % (ref 0–5)
HCT: 33.8 % — ABNORMAL LOW (ref 36.0–46.0)
HEMOGLOBIN: 11.1 g/dL — AB (ref 12.0–15.0)
Lymphocytes Relative: 15 % (ref 12–46)
Lymphs Abs: 1.1 10*3/uL (ref 0.7–4.0)
MCH: 27.9 pg (ref 26.0–34.0)
MCHC: 32.8 g/dL (ref 30.0–36.0)
MCV: 84.9 fL (ref 78.0–100.0)
MONOS PCT: 6 % (ref 3–12)
Monocytes Absolute: 0.4 10*3/uL (ref 0.1–1.0)
NEUTROS ABS: 5.3 10*3/uL (ref 1.7–7.7)
NEUTROS PCT: 77 % (ref 43–77)
PLATELETS: 266 10*3/uL (ref 150–400)
RBC: 3.98 MIL/uL (ref 3.87–5.11)
RDW: 15.8 % — ABNORMAL HIGH (ref 11.5–15.5)
WBC: 6.9 10*3/uL (ref 4.0–10.5)

## 2013-06-04 LAB — URINALYSIS, ROUTINE W REFLEX MICROSCOPIC
BILIRUBIN URINE: NEGATIVE
Glucose, UA: NEGATIVE mg/dL
HGB URINE DIPSTICK: NEGATIVE
Ketones, ur: NEGATIVE mg/dL
NITRITE: NEGATIVE
PH: 7 (ref 5.0–8.0)
Protein, ur: NEGATIVE mg/dL
SPECIFIC GRAVITY, URINE: 1.023 (ref 1.005–1.030)
Urobilinogen, UA: 1 mg/dL (ref 0.0–1.0)

## 2013-06-04 LAB — COMPREHENSIVE METABOLIC PANEL
ALT: 71 U/L — ABNORMAL HIGH (ref 0–35)
AST: 243 U/L — AB (ref 0–37)
Albumin: 3.8 g/dL (ref 3.5–5.2)
Alkaline Phosphatase: 81 U/L (ref 39–117)
BUN: 21 mg/dL (ref 6–23)
CALCIUM: 9.4 mg/dL (ref 8.4–10.5)
CO2: 21 meq/L (ref 19–32)
CREATININE: 1.08 mg/dL (ref 0.50–1.10)
Chloride: 103 mEq/L (ref 96–112)
GFR calc Af Amer: 66 mL/min — ABNORMAL LOW (ref >90)
GFR, EST NON AFRICAN AMERICAN: 57 mL/min — AB (ref >90)
Glucose, Bld: 116 mg/dL — ABNORMAL HIGH (ref 70–99)
Potassium: 4.2 mEq/L (ref 3.7–5.3)
Sodium: 140 mEq/L (ref 137–147)
Total Bilirubin: 0.6 mg/dL (ref 0.3–1.2)
Total Protein: 7.2 g/dL (ref 6.0–8.3)

## 2013-06-04 LAB — URINE MICROSCOPIC-ADD ON

## 2013-06-04 LAB — LIPASE, BLOOD: Lipase: 59 U/L (ref 11–59)

## 2013-06-04 MED ORDER — MORPHINE SULFATE 2 MG/ML IJ SOLN
2.0000 mg | Freq: Once | INTRAMUSCULAR | Status: AC
  Administered 2013-06-04: 2 mg via INTRAVENOUS
  Filled 2013-06-04 (×2): qty 1

## 2013-06-04 MED ORDER — DEXTROSE 5 % IV SOLN
1.0000 g | Freq: Once | INTRAVENOUS | Status: AC
  Administered 2013-06-04: 1 g via INTRAVENOUS
  Filled 2013-06-04: qty 10

## 2013-06-04 MED ORDER — CEPHALEXIN 500 MG PO CAPS: 500.0000 mg | ORAL_CAPSULE | Freq: Two times a day (BID) | ORAL | Status: DC

## 2013-06-04 MED ORDER — IOHEXOL 300 MG/ML  SOLN
100.0000 mL | Freq: Once | INTRAMUSCULAR | Status: AC | PRN
  Administered 2013-06-04: 100 mL via INTRAVENOUS

## 2013-06-04 MED ORDER — IOHEXOL 300 MG/ML  SOLN
25.0000 mL | INTRAMUSCULAR | Status: DC | PRN
Start: 2013-06-04 — End: 2013-06-04
  Administered 2013-06-04: 25 mL via ORAL

## 2013-06-04 MED ORDER — HYDROCODONE-ACETAMINOPHEN 5-325 MG PO TABS: 1.0000 | ORAL_TABLET | ORAL | Status: DC | PRN

## 2013-06-04 NOTE — ED Notes (Signed)
Missed attempt with ultrasound. IV team paged.

## 2013-06-04 NOTE — ED Notes (Signed)
Previous RN attempted IV w/o success.  This RN attempted IV x2 by [place] (unsuccessful). IV team attempted w/o success. Dr. Reather Converse aware, at [facility] at this time.

## 2013-06-04 NOTE — ED Notes (Signed)
IV team unsuccessful, Dr. Reather Converse at the bedside to start IV.

## 2013-06-04 NOTE — ED Notes (Signed)
IV team is at the bedside.  

## 2013-06-04 NOTE — ED Notes (Signed)
Presents with generalized abdominal pain worse in the back than abdomen . Denies nausea, vomiting, denies SOB, denies dizziness. Denies urinary frequency. HX of urinary tract infections that feel similair to this pain.  Pt described the pain a s "pressure-it feels like I have to have a BM but I can't." Last BM yesterday-normal for pt.

## 2013-06-04 NOTE — Discharge Instructions (Signed)
Take antibiotics as directed. Take over-the-counter pain medicines if pain is mild.For severe pain take norco or vicodin however realize they have the potential for addiction and it can make you sleepy and has tylenol in it.  No operating machinery while taking. Return to the ER or see a physician if you develop fevers, uncontrollable pain, persistent vomiting or new concerns.  If you were given medicines take as directed.  If you are on coumadin or contraceptives realize their levels and effectiveness is altered by many different medicines.  If you have any reaction (rash, tongues swelling, other) to the medicines stop taking and see a physician.   Please follow up as directed and return to the ER or see a physician for new or worsening symptoms.  Thank you. Filed Vitals:   06/04/13 0530 06/04/13 0615 06/04/13 0726 06/04/13 0732  BP: 117/57 121/76  102/56  Pulse: 79 88    Temp:      TempSrc:      Resp:    15  SpO2: 98% 98% 100% 100%

## 2013-06-04 NOTE — ED Notes (Signed)
IV team will come to start IV.

## 2013-06-04 NOTE — ED Notes (Signed)
Notified CT that pt is ready for transport

## 2013-06-04 NOTE — ED Provider Notes (Signed)
CSN: [id number]     Arrival date & time 06/04/13  0110 History   First MD Initiated Contact with Patient 06/04/13 0404     Chief Complaint  Patient presents with  . 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 Patient had central and lower, pain and pressure towards the back. Patient had milder similar symptoms with urine infection the past. Patient has been admitted for severe urine infections however this is a little different. Fairly constant. Symptoms have improved with time waiting in ER. No fevers chills or vomiting. Normal stools no bleeding.  Patient is a 58 y.o. female presenting with abdominal pain. The history is provided by the patient.  Abdominal Pain Associated symptoms: no chest pain, no chills, no diarrhea, no dysuria, no fever, no shortness of breath and no vomiting     Past Medical History  Diagnosis Date  . Brain cancer     Frontal lobe, 1993 and 2005  . Migraines   . Morton neuroma   . Seizures   . Migraine   . Fibromyalgia   . Right fibular fracture 07/12/2011  . 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 Anemia 03/06/2013   Past Surgical History  Procedure Laterality Date  . Appendectomy  1976  . Excision morton's neuroma      right foot  . Brain cancer      1993 and 2005  . Orif ankle fracture  07/12/2011    Procedure: OPEN REDUCTION INTERNAL FIXATION (ORIF) ANKLE FRACTURE;  Surgeon: Johnny Bridge, MD;  Location: [facility];  Service: Orthopedics;  Laterality: Right;   Family History  Problem Relation Age of Onset  . Diabetes Mother   . Diabetes Brother   . Hypertension Mother   .  Hypertension Father    History  Substance Use Topics  . Smoking status: Never Smoker   . Smokeless tobacco: Never Used  . Alcohol Use: No   OB History   Grav Para Term Preterm Abortions TAB SAB Ect Mult Living                 Review of Systems  Constitutional: Negative for fever and chills.  HENT: Negative for congestion.   Eyes: Negative for visual disturbance.  Respiratory: Negative for shortness of breath.   Cardiovascular: Negative for chest pain.  Gastrointestinal: Positive for abdominal pain. Negative for vomiting, diarrhea and blood in stool.  Genitourinary: Negative for dysuria and flank pain.  Musculoskeletal: Positive for back pain. Negative for neck pain and neck stiffness.  Skin: Negative for rash.  Neurological: Negative for light-headedness and headaches.      Allergies  Versed; Ciprofloxacin; Cyclobenzaprine; Methocarbamol; Metronidazole; and Penicillins  Home Medications   Prior to Admission medications   Medication Sig Start Date End Date Taking? Authorizing Provider  Calcium Citrate-Vitamin D (CITRACAL + D PO) Take 1 tablet by mouth daily. CA 1200mg -Vitamin D 1000iu   Yes Historical Provider, MD  DULoxetine (CYMBALTA) 60 MG capsule TAKE 1 CAPSULE BY MOUTH ONCE DAILY 03/09/13  Yes Midge Minium, MD  fenofibrate 160 MG tablet Take 160 mg by mouth daily.   Yes Historical Provider, MD  ferrous sulfate 325 (65 FE) MG tablet Take 1 tablet (325  mg total) by mouth daily with breakfast. 05/16/13  Yes Midge Minium, MD  fish oil-omega-3 fatty acids 1000 MG capsule Take 1 g by mouth 2 (two) times daily.     Yes Historical Provider, MD  levETIRAcetam (KEPPRA) 500 MG tablet Take 1,500 mg by mouth 2 (two) times daily.    Yes Historical Provider, MD  loperamide (IMODIUM) 2 MG capsule Take 2 capsules (4 mg total) by mouth as needed for diarrhea or loose stools. Maximum 16 mg daily 03/03/13  Yes Shari A Upstill, PA-C  meloxicam (MOBIC) 15 MG tablet Take 15 mg by mouth  daily.   Yes Historical Provider, MD  methocarbamol (ROBAXIN) 500 MG tablet Take 500 mg by mouth every 8 (eight) hours as needed for muscle spasms.   Yes Historical Provider, MD  omeprazole (PRILOSEC) 20 MG capsule Take 20 mg by mouth daily.   Yes Historical Provider, MD  polyethylene glycol (MIRALAX / GLYCOLAX) packet Take 17 g by mouth daily as needed for mild constipation. 03/08/13  Yes Marianne L York, PA-C  pregabalin (LYRICA) 150 MG capsule Take 150 mg by mouth 2 (two) times daily.  02/17/13  Yes Midge Minium, MD  promethazine (PHENERGAN) 25 MG tablet Take 25 mg by mouth every 6 (six) hours as needed for nausea (for severe headache). 11/19/12  Yes Midge Minium, MD  rosuvastatin (CRESTOR) 10 MG tablet Take 1 tablet by mouth  every Monday, Wednesday,  Friday 03/28/13  Yes Midge Minium, MD  traZODone (DESYREL) 50 MG tablet Take 50 mg by mouth at bedtime as needed for sleep.  02/28/13  Yes Midge Minium, MD   BP 121/76  Pulse 88  Temp(Src) 97.7 F (36.5 C) (Oral)  Resp 18  SpO2 98% Physical Exam  Nursing note and vitals reviewed. Constitutional: She is oriented to person, place, and time. She appears well-developed and well-nourished.  HENT:  Head: Normocephalic and atraumatic.  Eyes: Conjunctivae are normal. Right eye exhibits no discharge. Left eye exhibits no discharge.  Neck: Normal range of motion. Neck supple. No tracheal deviation present.  Cardiovascular: Normal rate and regular rhythm.   Pulmonary/Chest: Effort normal and breath sounds normal.  Abdominal: Soft. She exhibits no distension. There is tenderness (mild suprapubic and right lower and mid abdomen). There is no guarding.  Musculoskeletal: She exhibits no edema.  Neurological: She is alert and oriented to person, place, and time.  Skin: Skin is warm. No rash noted.  Psychiatric: She has a normal mood and affect.    ED Course  Procedures (including critical care time) Emergency Ultrasound Study:    Angiocath insertion Performed by: Mariea Clonts  Consent: Verbal consent obtained. Risks and benefits: risks, benefits and alternatives were discussed Immediately prior to procedure the correct patient, procedure, equipment, support staff and site/side marked as needed.  Indication: difficult IV access Preparation: Patient was prepped and draped in the usual sterile fashion. Vein Location: right basilic vein was visualized during assessment for potential access sites and was found to be patent/ easily compressed with linear ultrasound.  The needle was visualized with real-time ultrasound and guided into the vein. Gauge: 22 g  Image saved and stored.  Normal blood return.  Patient tolerance: Patient tolerated the procedure well with no immediate complications.     Labs Review Labs Reviewed  COMPREHENSIVE METABOLIC PANEL - Abnormal; Notable for the following:    Glucose, Bld 116 (*)    AST 243 (*)    ALT 71 (*)  GFR calc non Af Amer 57 (*)    GFR calc Af Amer 66 (*)    All other components within normal limits  CBC WITH DIFFERENTIAL - Abnormal; Notable for the following:    Hemoglobin 11.1 (*)    HCT 33.8 (*)    RDW 15.8 (*)    All other components within normal limits  URINALYSIS, ROUTINE W REFLEX MICROSCOPIC - Abnormal; Notable for the following:    APPearance CLOUDY (*)    Leukocytes, UA SMALL (*)    All other components within normal limits  URINE MICROSCOPIC-ADD ON - Abnormal; Notable for the following:    Bacteria, UA MANY (*)    All other components within normal limits  URINE CULTURE  LIPASE, BLOOD    Imaging Review No results found.   EKG Interpretation None      MDM   Final diagnoses:  None   Overall well-appearing female with nonspecific abdominal pain and pressure. Differential includes urine infection, constipation, biliary, aneurysm, other. Symptoms improved with no treatment in ER however mild symptoms persist. Urine possible mild  infection. CT abdomen to look for other significant cause. Pain medicines ordered.  Patient was a difficult IV, ultrasound-guided for IV.  CT abdomen pending. Likely UTI.  Plan to followup CT scan and disposition accordingly.  Signed out to ED physician to followup CT scan. If CT scan unremarkable plan for oral antibiotics and followup with primary Dr.  Abdominal pain, UTI   Mariea Clonts, MD 06/04/13 [phone]

## 2013-06-04 NOTE — ED Notes (Signed)
Missed attempt to start IV.

## 2013-06-04 NOTE — ED Provider Notes (Signed)
9:00 AM  Assumed care from Dr. Reather Converse.  Pt is a 58 y.o. F who presents emergency department with lower abdominal pain that feels similar to her prior urinary tract infections. Dr. that was reviewed patient's labs and CT scan and reassess patient. She does have mild elevation of her AST greater than her ALT. Her urine is a small leukocytes and many bacteria. Culture is pending. Her CT scan shows a left 4 mm distal ureteral calculus and gallstones versus sludge. On Dr. Nuala Alpha reassessment, patient is stable with no complaints of pain. Plan was to give the patient IV antibiotics and if she is still pain-free once these are completed and she could followup with her PCP as well as urology and general surgery as an outpatient.  9:30 AM  Pt's pain is still well controlled her abdominal exam is benign. She states she is rate for discharge home. We'll discharge with prescription for Keflex and Vicodin. She's been given surgery and urology outpatient followup information. Given strict return precautions. She verbalized understanding and is comfortable plan.  Los Ybanez, DO 06/04/13 [phone]

## 2013-06-05 ENCOUNTER — Encounter: Payer: Self-pay | Admitting: Family Medicine

## 2013-06-05 DIAGNOSIS — N200 Calculus of kidney: Secondary | ICD-10-CM

## 2013-06-05 DIAGNOSIS — K8020 Calculus of gallbladder without cholecystitis without obstruction: Secondary | ICD-10-CM

## 2013-06-05 LAB — URINE CULTURE

## 2013-06-06 ENCOUNTER — Telehealth: Payer: Self-pay | Admitting: Family Medicine

## 2013-06-06 ENCOUNTER — Encounter: Payer: Self-pay | Admitting: Family Medicine

## 2013-06-06 ENCOUNTER — Other Ambulatory Visit: Payer: Self-pay | Admitting: Family Medicine

## 2013-06-06 NOTE — Telephone Encounter (Signed)
Ok for [employer] 145mg  daily

## 2013-06-06 NOTE — Telephone Encounter (Signed)
Med filled.  

## 2013-06-06 NOTE — Telephone Encounter (Signed)
We already placed the referrals for this pt. This morning at the pt's request. Pt was notified by mychart.

## 2013-06-06 NOTE — Telephone Encounter (Signed)
Caller name: Bailea Relation to pt: self Call back number :[phone]   Reason for call:  Pt was seen at the ER on 5/30  And is needing an ER follow up.  They found gallstones and kidney stones.  Does this need to be a 15 or 30 for ER follow up?

## 2013-06-06 NOTE — Telephone Encounter (Signed)
[place] for linzess?

## 2013-06-06 NOTE — Telephone Encounter (Signed)
Caller name: Alyia  Call back [place]: [place].   Reason for call:  Pt wants to get an [place] for constipation.

## 2013-06-06 NOTE — Telephone Encounter (Signed)
Contacted pt and advised of referrals being placed and that pt did not need the ER follow up.

## 2013-06-07 ENCOUNTER — Encounter: Payer: Self-pay | Admitting: General Practice

## 2013-06-07 MED ORDER — LINACLOTIDE 145 MCG PO CAPS: 145.0000 ug | ORAL_CAPSULE | Freq: Every day | ORAL | Status: DC

## 2013-06-07 NOTE — Telephone Encounter (Signed)
Medication filled. Pt notified.

## 2013-06-17 ENCOUNTER — Ambulatory Visit (INDEPENDENT_AMBULATORY_CARE_PROVIDER_SITE_OTHER): Payer: 59 | Admitting: General Surgery

## 2013-06-22 ENCOUNTER — Ambulatory Visit (INDEPENDENT_AMBULATORY_CARE_PROVIDER_SITE_OTHER): Payer: 59 | Admitting: General Surgery

## 2013-06-22 ENCOUNTER — Encounter (INDEPENDENT_AMBULATORY_CARE_PROVIDER_SITE_OTHER): Payer: Self-pay | Admitting: General Surgery

## 2013-06-22 VITALS — BP 138/84 | HR 66 | Temp 98.6°F | Resp 99 | Ht 62.0 in | Wt 204.4 lb

## 2013-06-22 DIAGNOSIS — R109 Unspecified abdominal pain: Secondary | ICD-10-CM

## 2013-06-22 NOTE — Progress Notes (Signed)
Subjective:     Patient ID: [name], female   DOB: 15-Dec-1955, 58 y.o.   MRN: [id number]  HPI The patient is a 58 year old female who is referred by Dr. Birdie Riddle evaluation of symptomatic cholelithiasis. The patient was seen in the ER proximally week and half ago secondary to abdominal/back pain. The patient underwent CT scan which revealed gallstones as well as kidney stones.  Since that time the patient and her husband states she's had pain in her abdomen. This pain is not related to meals and usually last most of the day. The pain does not change based on the quality and or fat content of the meal. The patient states she has no nausea or vomiting associated with the pain..  Of note the patient has had previous brain surgery for a brain tumor he continues with a brain tumor at this time.she does have some short-term memory loss as per the patient's husband.  Review of Systems  Constitutional: Negative.   HENT: Negative.   Respiratory: Negative.   Cardiovascular: Negative.   Gastrointestinal: Positive for abdominal pain. Negative for nausea and vomiting.  Neurological: Negative.   All other systems reviewed and are negative.      Objective:   Physical Exam  Constitutional: She is oriented to person, place, and time. She appears well-developed and well-nourished.  HENT:  Head: Normocephalic and atraumatic.  Eyes: Conjunctivae and EOM are normal. Pupils are equal, round, and reactive to light.  Neck: Normal range of motion. Neck supple.  Cardiovascular: Normal rate, regular rhythm and normal heart sounds.   Pulmonary/Chest: Effort normal and breath sounds normal.  Abdominal: Soft. Bowel sounds are normal. She exhibits no distension and no mass. There is no tenderness. There is no rebound and no guarding.  Musculoskeletal: Normal range of motion.  Neurological: She is alert and oriented to person, place, and time.  Skin: Skin is warm and dry.  Psychiatric: She has a normal mood and  affect.       Assessment:     58 year old female with cholelithiasis and possible symptomatic cholelithiasis versus nonspecific abdominal pain and the pain associated with kidney stones.     Plan:     1. This time it is difficult to discern whether or not the patient has pain secondary to cholelithiasis. At this time I would proceed with HIDA scan to help evaluate her biliary tract system. 2. Once the results come back we will discuss with the patient the results as well as the possible need for surgery. I also discussed with the patient's husband to be mindful to keep track of the patient's pain after meals and quality to food she eats.

## 2013-06-29 ENCOUNTER — Ambulatory Visit: Payer: 59 | Admitting: Family Medicine

## 2013-06-30 ENCOUNTER — Encounter: Payer: Self-pay | Admitting: Family Medicine

## 2013-06-30 ENCOUNTER — Other Ambulatory Visit: Payer: Self-pay | Admitting: Family Medicine

## 2013-06-30 ENCOUNTER — Ambulatory Visit (INDEPENDENT_AMBULATORY_CARE_PROVIDER_SITE_OTHER): Payer: 59 | Admitting: Family Medicine

## 2013-06-30 VITALS — BP 130/82 | HR 79 | Temp 97.9°F | Resp 16 | Wt 202.1 lb

## 2013-06-30 DIAGNOSIS — D509 Iron deficiency anemia, unspecified: Secondary | ICD-10-CM

## 2013-06-30 LAB — IBC PANEL
IRON: 94 ug/dL (ref 42–145)
Saturation Ratios: 16.2 % — ABNORMAL LOW (ref 20.0–50.0)
Transferrin: 414.5 mg/dL — ABNORMAL HIGH (ref 212.0–360.0)

## 2013-06-30 NOTE — Progress Notes (Signed)
   Subjective:    Patient ID: [name], female    DOB: 1955-07-12, 58 y.o.   MRN: [id number]  HPI Anemia- noted on labs done recently.  Pt was started on Iron on 5/12.  No GI side effects.  No constipation, abd pain, N/V.  Stools are dark in color but no visible blood.  No  CP, SOB.   Review of Systems For ROS see HPI     Objective:   Physical Exam  Vitals reviewed. Constitutional: She is oriented to person, place, and time. She appears well-developed and well-nourished. No distress.  HENT:  Head: Normocephalic and atraumatic.  Eyes: Conjunctivae and EOM are normal. Pupils are equal, round, and reactive to light.  Neck: Normal range of motion. Neck supple. No thyromegaly present.  Cardiovascular: Normal rate, regular rhythm, normal heart sounds and intact distal pulses.   No murmur heard. Pulmonary/Chest: Effort normal and breath sounds normal. No respiratory distress.  Abdominal: Soft. She exhibits no distension. There is no tenderness.  Musculoskeletal: She exhibits no edema.  Lymphadenopathy:    She has no cervical adenopathy.  Neurological: She is alert and oriented to person, place, and time.  Skin: Skin is warm and dry.  Psychiatric: She has a normal mood and affect. Her behavior is normal.          Assessment & Plan:

## 2013-06-30 NOTE — Telephone Encounter (Signed)
Med filled.  

## 2013-06-30 NOTE — Patient Instructions (Signed)
Schedule your complete physical at your convenience We'll notify you of your lab results and make any changes if needed Continue to take the iron daily Call with any questions or concerns Have a great summer!

## 2013-06-30 NOTE — Assessment & Plan Note (Signed)
Noted on recent labs.  Pt is currently asymptomatic.  Pt has been taking iron supplement for nearly 6 weeks.  Repeat CBC and iron panel.  Will continue to follow.

## 2013-06-30 NOTE — Progress Notes (Signed)
Pre visit review using our clinic review tool, if applicable. No additional management support is needed unless otherwise documented below in the visit note. 

## 2013-07-06 ENCOUNTER — Ambulatory Visit (INDEPENDENT_AMBULATORY_CARE_PROVIDER_SITE_OTHER): Payer: 59 | Admitting: General Surgery

## 2013-07-07 ENCOUNTER — Other Ambulatory Visit: Payer: Self-pay | Admitting: Family Medicine

## 2013-07-08 ENCOUNTER — Other Ambulatory Visit: Payer: Self-pay | Admitting: Family Medicine

## 2013-07-11 ENCOUNTER — Encounter: Payer: Self-pay | Admitting: Family Medicine

## 2013-07-11 MED ORDER — TRAZODONE HCL 50 MG PO TABS: 50.0000 mg | ORAL_TABLET | Freq: Every evening | ORAL | Status: DC | PRN

## 2013-07-11 NOTE — Telephone Encounter (Signed)
Rx was denied because it was already filled today.//AB/CMA

## 2013-07-11 NOTE — Telephone Encounter (Signed)
Last seen 06/30/13 and filled 10/18/12 #30 with 3 refills.   Please advise     KP

## 2013-07-12 ENCOUNTER — Other Ambulatory Visit: Payer: Self-pay

## 2013-07-12 ENCOUNTER — Encounter (HOSPITAL_COMMUNITY)
Admission: RE | Admit: 2013-07-12 | Discharge: 2013-07-12 | Disposition: A | Discharge status: Home / Self Care | Payer: 59 | Source: Ambulatory Visit | Attending: General Surgery | Admitting: General Surgery

## 2013-07-12 ENCOUNTER — Other Ambulatory Visit (HOSPITAL_COMMUNITY): Payer: Self-pay | Admitting: Urology

## 2013-07-12 ENCOUNTER — Ambulatory Visit (HOSPITAL_COMMUNITY)
Admission: RE | Admit: 2013-07-12 | Discharge: 2013-07-12 | Disposition: A | Discharge status: Home / Self Care | Payer: 59 | Source: Ambulatory Visit | Attending: Urology | Admitting: Urology

## 2013-07-12 DIAGNOSIS — N201 Calculus of ureter: Secondary | ICD-10-CM

## 2013-07-12 DIAGNOSIS — R1011 Right upper quadrant pain: Secondary | ICD-10-CM | POA: Insufficient documentation

## 2013-07-12 DIAGNOSIS — R109 Unspecified abdominal pain: Secondary | ICD-10-CM

## 2013-07-12 DIAGNOSIS — Z0389 Encounter for observation for other suspected diseases and conditions ruled out: Secondary | ICD-10-CM | POA: Insufficient documentation

## 2013-07-12 MED ORDER — OMEPRAZOLE 20 MG PO CPDR: 20.0000 mg | DELAYED_RELEASE_CAPSULE | Freq: Every day | ORAL | Status: DC

## 2013-07-12 MED ORDER — SINCALIDE 5 MCG IJ SOLR
0.0200 ug/kg | Freq: Once | INTRAMUSCULAR | Status: AC
  Administered 2013-07-12: 1.8 ug via INTRAVENOUS

## 2013-07-12 MED ORDER — TECHNETIUM TC 99M MEBROFENIN IV KIT
5.5000 | PACK | Freq: Once | INTRAVENOUS | Status: AC | PRN
  Administered 2013-07-12: 5.5 via INTRAVENOUS

## 2013-07-14 ENCOUNTER — Telehealth (INDEPENDENT_AMBULATORY_CARE_PROVIDER_SITE_OTHER): Payer: Self-pay

## 2013-07-14 NOTE — Telephone Encounter (Signed)
Called and left message for patient to call our office RE:  HIDA scan results normal.  Patient needs office appointment with Dr. Rosendo Gros to discuss results and surgery

## 2013-07-14 NOTE — Telephone Encounter (Signed)
Message copied by Ivor Costa on Thu Jul 14, 2013 12:24 PM ------      Message from: Ralene Ok      Created: Tue Jul 12, 2013 11:07 PM       Can you inform her of her normal HIDA scan.            Also if we can get her to come back in to discuss that and her sx's if she's still havign them.            AR ------

## 2013-07-15 ENCOUNTER — Encounter: Payer: Self-pay | Admitting: Family Medicine

## 2013-07-21 ENCOUNTER — Encounter (INDEPENDENT_AMBULATORY_CARE_PROVIDER_SITE_OTHER): Payer: 59 | Admitting: General Surgery

## 2013-07-22 ENCOUNTER — Ambulatory Visit: Payer: 59 | Admitting: Family Medicine

## 2013-07-25 ENCOUNTER — Ambulatory Visit: Payer: 59 | Admitting: Physician Assistant

## 2013-07-31 ENCOUNTER — Emergency Department (HOSPITAL_COMMUNITY): Payer: 59

## 2013-07-31 ENCOUNTER — Observation Stay (HOSPITAL_COMMUNITY)
Admission: EM | Admit: 2013-07-31 | Discharge: 2013-08-04 | DRG: 418 | Disposition: A | Discharge status: Skilled Nursing Facility | Payer: 59 | Attending: Internal Medicine | Admitting: Internal Medicine

## 2013-07-31 ENCOUNTER — Encounter (HOSPITAL_COMMUNITY): Payer: Self-pay | Admitting: Emergency Medicine

## 2013-07-31 DIAGNOSIS — R188 Other ascites: Secondary | ICD-10-CM | POA: Insufficient documentation

## 2013-07-31 DIAGNOSIS — N12 Tubulo-interstitial nephritis, not specified as acute or chronic: Secondary | ICD-10-CM | POA: Insufficient documentation

## 2013-07-31 DIAGNOSIS — R300 Dysuria: Secondary | ICD-10-CM

## 2013-07-31 DIAGNOSIS — R29898 Other symptoms and signs involving the musculoskeletal system: Secondary | ICD-10-CM

## 2013-07-31 DIAGNOSIS — K7200 Acute and subacute hepatic failure without coma: Secondary | ICD-10-CM | POA: Diagnosis not present

## 2013-07-31 DIAGNOSIS — M546 Pain in thoracic spine: Secondary | ICD-10-CM

## 2013-07-31 DIAGNOSIS — R569 Unspecified convulsions: Secondary | ICD-10-CM | POA: Diagnosis not present

## 2013-07-31 DIAGNOSIS — K759 Inflammatory liver disease, unspecified: Secondary | ICD-10-CM | POA: Diagnosis present

## 2013-07-31 DIAGNOSIS — Z791 Long term (current) use of non-steroidal anti-inflammatories (NSAID): Secondary | ICD-10-CM | POA: Insufficient documentation

## 2013-07-31 DIAGNOSIS — F3289 Other specified depressive episodes: Secondary | ICD-10-CM | POA: Diagnosis not present

## 2013-07-31 DIAGNOSIS — J309 Allergic rhinitis, unspecified: Secondary | ICD-10-CM

## 2013-07-31 DIAGNOSIS — C711 Malignant neoplasm of frontal lobe: Secondary | ICD-10-CM | POA: Diagnosis not present

## 2013-07-31 DIAGNOSIS — R252 Cramp and spasm: Secondary | ICD-10-CM

## 2013-07-31 DIAGNOSIS — D649 Anemia, unspecified: Secondary | ICD-10-CM | POA: Insufficient documentation

## 2013-07-31 DIAGNOSIS — R7401 Elevation of levels of liver transaminase levels: Secondary | ICD-10-CM

## 2013-07-31 DIAGNOSIS — K810 Acute cholecystitis: Secondary | ICD-10-CM

## 2013-07-31 DIAGNOSIS — M25579 Pain in unspecified ankle and joints of unspecified foot: Secondary | ICD-10-CM | POA: Diagnosis not present

## 2013-07-31 DIAGNOSIS — IMO0001 Reserved for inherently not codable concepts without codable children: Secondary | ICD-10-CM | POA: Diagnosis not present

## 2013-07-31 DIAGNOSIS — N390 Urinary tract infection, site not specified: Secondary | ICD-10-CM

## 2013-07-31 DIAGNOSIS — R740 Nonspecific elevation of levels of transaminase and lactic acid dehydrogenase [LDH]: Secondary | ICD-10-CM

## 2013-07-31 DIAGNOSIS — E785 Hyperlipidemia, unspecified: Secondary | ICD-10-CM | POA: Insufficient documentation

## 2013-07-31 DIAGNOSIS — Z6835 Body mass index (BMI) 35.0-35.9, adult: Secondary | ICD-10-CM | POA: Insufficient documentation

## 2013-07-31 DIAGNOSIS — E669 Obesity, unspecified: Secondary | ICD-10-CM | POA: Diagnosis not present

## 2013-07-31 DIAGNOSIS — Z79899 Other long term (current) drug therapy: Secondary | ICD-10-CM | POA: Diagnosis not present

## 2013-07-31 DIAGNOSIS — S8251XA Displaced fracture of medial malleolus of right tibia, initial encounter for closed fracture: Secondary | ICD-10-CM

## 2013-07-31 DIAGNOSIS — K8000 Calculus of gallbladder with acute cholecystitis without obstruction: Principal | ICD-10-CM | POA: Insufficient documentation

## 2013-07-31 DIAGNOSIS — Z124 Encounter for screening for malignant neoplasm of cervix: Secondary | ICD-10-CM

## 2013-07-31 DIAGNOSIS — F329 Major depressive disorder, single episode, unspecified: Secondary | ICD-10-CM | POA: Insufficient documentation

## 2013-07-31 DIAGNOSIS — G4700 Insomnia, unspecified: Secondary | ICD-10-CM

## 2013-07-31 DIAGNOSIS — R1011 Right upper quadrant pain: Secondary | ICD-10-CM | POA: Diagnosis present

## 2013-07-31 DIAGNOSIS — B179 Acute viral hepatitis, unspecified: Secondary | ICD-10-CM

## 2013-07-31 DIAGNOSIS — M797 Fibromyalgia: Secondary | ICD-10-CM | POA: Diagnosis present

## 2013-07-31 DIAGNOSIS — Z0000 Encounter for general adult medical examination without abnormal findings: Secondary | ICD-10-CM

## 2013-07-31 DIAGNOSIS — D509 Iron deficiency anemia, unspecified: Secondary | ICD-10-CM

## 2013-07-31 DIAGNOSIS — B369 Superficial mycosis, unspecified: Secondary | ICD-10-CM

## 2013-07-31 DIAGNOSIS — R531 Weakness: Secondary | ICD-10-CM

## 2013-07-31 DIAGNOSIS — N898 Other specified noninflammatory disorders of vagina: Secondary | ICD-10-CM

## 2013-07-31 LAB — CBC WITH DIFFERENTIAL/PLATELET
BASOS PCT: 0 % (ref 0–1)
Basophils Absolute: 0 10*3/uL (ref 0.0–0.1)
EOS ABS: 0.1 10*3/uL (ref 0.0–0.7)
EOS PCT: 2 % (ref 0–5)
HEMATOCRIT: 39.9 % (ref 36.0–46.0)
HEMOGLOBIN: 13.2 g/dL (ref 12.0–15.0)
LYMPHS ABS: 1.1 10*3/uL (ref 0.7–4.0)
Lymphocytes Relative: 24 % (ref 12–46)
MCH: 29.1 pg (ref 26.0–34.0)
MCHC: 33.1 g/dL (ref 30.0–36.0)
MCV: 87.9 fL (ref 78.0–100.0)
MONO ABS: 0.5 10*3/uL (ref 0.1–1.0)
MONOS PCT: 11 % (ref 3–12)
Neutro Abs: 3 10*3/uL (ref 1.7–7.7)
Neutrophils Relative %: 63 % (ref 43–77)
Platelets: 184 10*3/uL (ref 150–400)
RBC: 4.54 MIL/uL (ref 3.87–5.11)
RDW: 15.8 % — ABNORMAL HIGH (ref 11.5–15.5)
WBC: 4.7 10*3/uL (ref 4.0–10.5)

## 2013-07-31 LAB — URINALYSIS, ROUTINE W REFLEX MICROSCOPIC
GLUCOSE, UA: NEGATIVE mg/dL
Ketones, ur: NEGATIVE mg/dL
Nitrite: POSITIVE — AB
Protein, ur: NEGATIVE mg/dL
Specific Gravity, Urine: 1.01 (ref 1.005–1.030)
Urobilinogen, UA: 1 mg/dL (ref 0.0–1.0)
pH: 6 (ref 5.0–8.0)

## 2013-07-31 LAB — URINE MICROSCOPIC-ADD ON

## 2013-07-31 LAB — COMPREHENSIVE METABOLIC PANEL
ALBUMIN: 3.4 g/dL — AB (ref 3.5–5.2)
ALK PHOS: 91 U/L (ref 39–117)
ALT: 325 U/L — ABNORMAL HIGH (ref 0–35)
ANION GAP: 17 — AB (ref 5–15)
AST: 730 U/L — ABNORMAL HIGH (ref 0–37)
BUN: 13 mg/dL (ref 6–23)
CO2: 20 mEq/L (ref 19–32)
CREATININE: 0.8 mg/dL (ref 0.50–1.10)
Calcium: 9.8 mg/dL (ref 8.4–10.5)
Chloride: 100 mEq/L (ref 96–112)
GFR calc non Af Amer: 81 mL/min — ABNORMAL LOW (ref >90)
GLUCOSE: 80 mg/dL (ref 70–99)
Potassium: 5.8 mEq/L — ABNORMAL HIGH (ref 3.7–5.3)
Sodium: 137 mEq/L (ref 137–147)
TOTAL PROTEIN: 7.7 g/dL (ref 6.0–8.3)
Total Bilirubin: 2 mg/dL — ABNORMAL HIGH (ref 0.3–1.2)

## 2013-07-31 LAB — HEPATIC FUNCTION PANEL
ALBUMIN: 3.3 g/dL — AB (ref 3.5–5.2)
ALK PHOS: 89 U/L (ref 39–117)
ALT: 291 U/L — ABNORMAL HIGH (ref 0–35)
AST: 548 U/L — ABNORMAL HIGH (ref 0–37)
BILIRUBIN INDIRECT: 0.6 mg/dL (ref 0.3–0.9)
BILIRUBIN TOTAL: 1.2 mg/dL (ref 0.3–1.2)
Bilirubin, Direct: 0.6 mg/dL — ABNORMAL HIGH (ref 0.0–0.3)
TOTAL PROTEIN: 7 g/dL (ref 6.0–8.3)

## 2013-07-31 LAB — ACETAMINOPHEN LEVEL: Acetaminophen (Tylenol), Serum: <15 ug/mL (ref 10–30)

## 2013-07-31 LAB — POTASSIUM: Potassium: 4.2 mEq/L (ref 3.7–5.3)

## 2013-07-31 LAB — ETHANOL: Alcohol, Ethyl (B): <11 mg/dL (ref 0–11)

## 2013-07-31 LAB — LIPASE, BLOOD: Lipase: 18 U/L (ref 11–59)

## 2013-07-31 MED ORDER — SODIUM CHLORIDE 0.9 % IV BOLUS (SEPSIS)
1000.0000 mL | Freq: Once | INTRAVENOUS | Status: AC
  Administered 2013-07-31: 1000 mL via INTRAVENOUS

## 2013-07-31 MED ORDER — DULOXETINE HCL 60 MG PO CPEP
60.0000 mg | ORAL_CAPSULE | Freq: Every day | ORAL | Status: DC
  Administered 2013-08-01 – 2013-08-04 (×4): 60 mg via ORAL
  Filled 2013-07-31 (×4): qty 1

## 2013-07-31 MED ORDER — ONDANSETRON HCL 4 MG PO TABS: 4.0000 mg | ORAL_TABLET | Freq: Four times a day (QID) | ORAL | Status: DC | PRN

## 2013-07-31 MED ORDER — ONDANSETRON HCL 4 MG/2ML IJ SOLN
4.0000 mg | Freq: Once | INTRAMUSCULAR | Status: AC
  Administered 2013-07-31: 4 mg via INTRAVENOUS
  Filled 2013-07-31: qty 2

## 2013-07-31 MED ORDER — MORPHINE SULFATE 4 MG/ML IJ SOLN
4.0000 mg | Freq: Once | INTRAMUSCULAR | Status: AC
  Administered 2013-07-31: 4 mg via INTRAVENOUS
  Filled 2013-07-31: qty 1

## 2013-07-31 MED ORDER — ONDANSETRON HCL 4 MG/2ML IJ SOLN
4.0000 mg | Freq: Four times a day (QID) | INTRAMUSCULAR | Status: DC | PRN
Start: 2013-07-31 — End: 2013-08-04

## 2013-07-31 MED ORDER — PREGABALIN 100 MG PO CAPS
300.0000 mg | ORAL_CAPSULE | Freq: Every day | ORAL | Status: DC
  Administered 2013-08-01 – 2013-08-03 (×3): 300 mg via ORAL
  Filled 2013-07-31 (×3): qty 3

## 2013-07-31 MED ORDER — MORPHINE SULFATE 2 MG/ML IJ SOLN
2.0000 mg | INTRAMUSCULAR | Status: DC | PRN
  Administered 2013-07-31 – 2013-08-02 (×2): 2 mg via INTRAVENOUS
  Filled 2013-07-31 (×2): qty 1

## 2013-07-31 MED ORDER — SODIUM CHLORIDE 0.9 % IV SOLN: INTRAVENOUS | Status: AC

## 2013-07-31 MED ORDER — LINACLOTIDE 145 MCG PO CAPS
145.0000 ug | ORAL_CAPSULE | Freq: Every day | ORAL | Status: DC
  Administered 2013-08-01 – 2013-08-04 (×4): 145 ug via ORAL
  Filled 2013-07-31 (×5): qty 1

## 2013-07-31 MED ORDER — HEPARIN SODIUM (PORCINE) 5000 UNIT/ML IJ SOLN
5000.0000 [IU] | Freq: Three times a day (TID) | INTRAMUSCULAR | Status: DC
  Administered 2013-07-31 – 2013-08-01 (×3): 5000 [IU] via SUBCUTANEOUS
  Filled 2013-07-31 (×6): qty 1

## 2013-07-31 MED ORDER — DEXTROSE 5 % IV SOLN
1.0000 g | INTRAVENOUS | Status: DC
  Administered 2013-08-01 – 2013-08-02 (×2): 1 g via INTRAVENOUS
  Filled 2013-07-31 (×3): qty 10

## 2013-07-31 MED ORDER — LEVETIRACETAM 500 MG PO TABS
1500.0000 mg | ORAL_TABLET | Freq: Two times a day (BID) | ORAL | Status: DC
  Administered 2013-07-31 – 2013-08-04 (×8): 1500 mg via ORAL
  Filled 2013-07-31 (×3): qty 2
  Filled 2013-07-31 (×3): qty 3
  Filled 2013-07-31: qty 2

## 2013-07-31 MED ORDER — MELOXICAM 15 MG PO TABS
15.0000 mg | ORAL_TABLET | Freq: Every day | ORAL | Status: DC
  Administered 2013-08-01 – 2013-08-04 (×4): 15 mg via ORAL
  Filled 2013-07-31 (×5): qty 1

## 2013-07-31 MED ORDER — DEXTROSE 5 % IV SOLN
1.0000 g | Freq: Once | INTRAVENOUS | Status: AC
  Administered 2013-07-31: 1 g via INTRAVENOUS
  Filled 2013-07-31: qty 10

## 2013-07-31 MED ORDER — PANTOPRAZOLE SODIUM 40 MG PO TBEC
40.0000 mg | DELAYED_RELEASE_TABLET | Freq: Every day | ORAL | Status: DC
  Administered 2013-08-01 – 2013-08-04 (×4): 40 mg via ORAL
  Filled 2013-07-31 (×3): qty 1

## 2013-07-31 MED ORDER — FENOFIBRATE 160 MG PO TABS
160.0000 mg | ORAL_TABLET | Freq: Every day | ORAL | Status: DC
  Administered 2013-08-01 – 2013-08-02 (×2): 160 mg via ORAL
  Filled 2013-07-31 (×3): qty 1

## 2013-07-31 NOTE — H&P (Addendum)
[facility] History and Physical  [name] KTG:[id number] DOB: [birth year] DOA: 07/31/2013  Referring physician: Emergency department PCP: Annye Asa, MD  Specialists:   Chief Complaint: Abd pain  HPI: [name] is a 58 y.o. female  With a hx brain tumor s/p tx, seizures, fibromyalgia who presents with 2 week hx of worsening abd pain. Pt reportedly has been seen in the outpatient for similar symptoms and was found to have GB sludge without acute occlusion on serial imaging. Pt was being considered for cholecystectomy.  In the Ed, pt found to have UA suggestive of UTI. Pt was started on rocephin empirically. Given outpt hx of biliary sludge and consideration for cholecystectomy, General Surgery was consulted through the ED. The hospitalist service was consulted for consideration for admission.  Review of Systems:   Per above, the remainder of the 10pt ros reviewed and are neg  Past Medical History  Diagnosis Date  . Brain cancer     Frontal lobe, 1993 and 2005  . Migraines   . Morton neuroma   . Seizures   . Migraine   . Fibromyalgia   . Right fibular fracture 07/12/2011  . 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 Anemia 03/06/2013   Past Surgical History  Procedure Laterality Date  . Appendectomy  1976  . Excision morton's neuroma      right foot  . Brain cancer      1993 and 2005  . Orif ankle fracture  07/12/2011    Procedure: OPEN REDUCTION INTERNAL FIXATION (ORIF) ANKLE FRACTURE;  Surgeon: Johnny Bridge, MD;  Location: [facility];  Service: Orthopedics;  Laterality: Right;   Social History:  reports that she has never smoked. She has never used smokeless tobacco. She reports that she does not drink alcohol or use illicit drugs.  where does patient live--home, ALF, SNF? and with whom if at home?  Can patient participate in ADLs?  Allergies  Allergen Reactions  . Versed [Midazolam] Other  (See Comments) and Anaphylaxis    Severe respiratory depression, had to be masked in preop holding.  . Ciprofloxacin     REACTION: NAUSEA AND VOMITING  . Cyclobenzaprine Itching    Other reaction(s): Itching / Pruritis (ALLERGY/intolerance)  . Methocarbamol     REACTION: itching Patient currently tolerating it as its on her home med list.  . Metronidazole   . Penicillins Rash    REACTION: RASH    Family History  Problem Relation Age of Onset  . Diabetes Mother   . Diabetes Brother   . Hypertension Mother   . Hypertension Father     (be sure to complete)  Prior to Admission medications   Medication Sig Start Date End Date Taking? Authorizing Provider  Calcium Citrate-Vitamin D (CITRACAL + D PO) Take 1 tablet by mouth daily. CA 1218m-Vitamin D 1000iu   Yes Historical Provider, MD  DULoxetine (CYMBALTA) 60 MG capsule Take 60 mg by mouth daily.   Yes Historical Provider, MD  fenofibrate 160 MG tablet Take 160 mg by mouth daily.   Yes Historical Provider, MD  fish oil-omega-3 fatty acids 1000 MG capsule Take 1 g by mouth 2 (two) times daily.     Yes Historical Provider, MD  HYDROcodone-acetaminophen (NORCO/VICODIN) 5-325 MG per tablet Take 1-2 tablets by mouth every 4 (four) hours as needed for moderate pain.   Yes Historical Provider, MD  levETIRAcetam (KEPPRA)  500 MG tablet Take 1,500 mg by mouth 2 (two) times daily.    Yes Historical Provider, MD  Linaclotide Rolan Lipa) 145 MCG CAPS capsule Take 1 capsule (145 mcg total) by mouth daily. 06/07/13  Yes Midge Minium, MD  meloxicam (MOBIC) 15 MG tablet Take 15 mg by mouth daily.   Yes Historical Provider, MD  methocarbamol (ROBAXIN) 500 MG tablet Take 500 mg by mouth every 8 (eight) hours as needed for muscle spasms.   Yes Historical Provider, MD  omeprazole (PRILOSEC) 20 MG capsule Take 1 capsule (20 mg total) by mouth daily. 07/12/13  Yes Midge Minium, MD  pregabalin (LYRICA) 150 MG capsule Take 300 mg by mouth daily.   Yes  Historical Provider, MD  promethazine (PHENERGAN) 25 MG tablet Take 25 mg by mouth every 6 (six) hours as needed for nausea.  05/26/13  Yes Historical Provider, MD  rosuvastatin (CRESTOR) 10 MG tablet Take 10 mg by mouth 3 (three) times a week. Monday Wednesday and Friday   Yes Historical Provider, MD  traZODone (DESYREL) 50 MG tablet Take 1 tablet (50 mg total) by mouth at bedtime as needed for sleep. 07/11/13  Yes Midge Minium, MD   Physical Exam: Filed Vitals:   07/31/13 1515 07/31/13 1555 07/31/13 1600 07/31/13 1615  BP: 103/76 105/71 107/62 93/73  Pulse: 80 79 83 78  Temp:      TempSrc:      Resp:  19    SpO2: 92% 97% 91% 93%     General:  Awake, in nad  Eyes: PERRL B  ENT: membranes moist, dentition fair  Neck: trachea midline, neck supple  Cardiovascular: regular, s1, s2  Respiratory: normal resp effort, no wheezing  Abdomen: soft, nondistended, decreased BS  Skin: normal skin turgor, no abnormal skin lesions seen  Musculoskeletal: perfused, no clubbing  Psychiatric: mood/affect normal//no auditory/visual hallucinations  Neurologic: cn2-12 grossly intact, strength/sensation intact  Labs on Admission:  Basic Metabolic Panel:  Recent Labs Lab 07/31/13 1000 07/31/13 1443  NA 137  --   K 5.8* 4.2  CL 100  --   CO2 20  --   GLUCOSE 80  --   BUN 13  --   CREATININE 0.80  --   CALCIUM 9.8  --    Liver Function Tests:  Recent Labs Lab 07/31/13 1000 07/31/13 1443  AST 730* 548*  ALT 325* 291*  ALKPHOS 91 89  BILITOT 2.0* 1.2  PROT 7.7 7.0  ALBUMIN 3.4* 3.3*    Recent Labs Lab 07/31/13 1000  LIPASE 18   No results found for this basename: AMMONIA,  in the last 168 hours CBC:  Recent Labs Lab 07/31/13 1000  WBC 4.7  NEUTROABS 3.0  HGB 13.2  HCT 39.9  MCV 87.9  PLT 184   Cardiac Enzymes: No results found for this basename: CKTOTAL, CKMB, CKMBINDEX, TROPONINI,  in the last 168 hours  BNP (last 3 results) No results found for this  basename: PROBNP,  in the last 8760 hours CBG: No results found for this basename: GLUCAP,  in the last 168 hours  Radiological Exams on Admission: Ct Abdomen Pelvis Wo Contrast  07/31/2013   CLINICAL DATA:  Abdominal pain for the past 2 weeks more severe today.  EXAM: CT ABDOMEN AND PELVIS WITHOUT CONTRAST  TECHNIQUE: Multidetector CT imaging of the abdomen and pelvis was performed following the standard protocol without IV contrast.  COMPARISON:  CT of the abdomen and pelvis 06/04/2013.  FINDINGS: Lung Bases:  There is either a trace amount of intermediate attenuation fluid or there is pleural thickening in the posterior aspect of the lower right hemithorax. Small amount of subsegmental atelectasis in the lower lobes of the lungs bilaterally.  Abdomen/Pelvis: Some amorphous high attenuation material is noted in the dependent portion of the gallbladder, favored to predominantly reflect some biliary sludge, although some small partially calcified gallstones are also possible. No current findings to suggest an acute cholecystitis at this time. The unenhanced appearance of the liver, pancreas, spleen, bilateral adrenal glands and the right kidney is unremarkable. The left kidney is mildly atrophic and there are some faint amorphous medullary calcifications within the left kidney.  Small volume of ascites. No pneumoperitoneum. No pathologic distention of small bowel. The uterus and ovaries are unremarkable in appearance. No lymphadenopathy identified within the abdomen or pelvis on today's non contrast CT examination. Urinary bladder is normal in appearance.  Musculoskeletal: There are no aggressive appearing lytic or blastic lesions noted in the visualized portions of the skeleton.  IMPRESSION: 1. There appears to be biliary sludge and potentially tiny gallstones in the gallbladder. No current findings to suggest acute cholecystitis at this time. 2. Trace amount of intermediate attenuation pleural fluid and/or  pleural thickening in the posterior aspect of the lower right hemithorax. This is of uncertain etiology and significance. 3. Small volume of ascites. 4. Additional incidental findings, as above.   Electronically Signed   By: Vinnie Langton M.D.   On: 07/31/2013 15:57   Dg Chest 2 View  07/31/2013   CLINICAL DATA:  ANKLE PAIN ABDOMINAL PAIN  EXAM: CHEST - 2 VIEW  COMPARISON:  03/06/2013  FINDINGS: Linear scarring or subsegmental atelectasis in the right mid lung as before. Left lung clear. Heart size normal. No effusion. Regional bones unremarkable.  IMPRESSION: 1. No acute disease   Electronically Signed   By: Arne Cleveland M.D.   On: 07/31/2013 14:17   Dg Tibia/fibula Right  07/31/2013   CLINICAL DATA:  ANKLE PAIN ABDOMINAL PAIN  EXAM: RIGHT TIBIA AND FIBULA - 2 VIEW  COMPARISON:  07/12/2011  FINDINGS: Stable postop changes in the distal fibula. No acute fracture or dislocation. Normal alignment and mineralization. No significant osseous degenerative change.  IMPRESSION: No acute disease   Electronically Signed   By: Arne Cleveland M.D.   On: 07/31/2013 12:26   Dg Ankle Complete Right  07/31/2013   CLINICAL DATA:  ANKLE PAIN ABDOMINAL PAIN  EXAM: RIGHT ANKLE - COMPLETE 3+ VIEW  COMPARISON:  07/12/2011  FINDINGS: Interval Removal of cast material. Stable plate and screw fixation of distal fibula. Small cortical avulsion injury from the medial malleolus, of uncertain chronicity. Ankle mortise intact. Normal mineralization and alignment. Calcaneal spurs at the plantar aponeurosis and Achilles tendon insertion.  IMPRESSION: 1. Medial malleolus cortical avulsion injury, age indeterminate. Correlate with point tenderness 2. Stable postop changes distal fibula. 3. Calcaneal spurs.   Electronically Signed   By: Arne Cleveland M.D.   On: 07/31/2013 12:25   US Abdomen Limited Ruq  07/31/2013   CLINICAL DATA:  Right upper quadrant abdominal pain.  EXAM: US ABDOMEN LIMITED - RIGHT UPPER QUADRANT  COMPARISON:   CT of the abdomen and pelvis 06/04/2013.  FINDINGS: Gallbladder:  Multiple echogenic foci with posterior acoustic shadowing throughout the gallbladder, compatible with calcified gallstones. Gallbladder is moderately distended. Gallbladder wall thickness is normal at 2.8 mm. No abnormal pericholecystic fluid. Per report from the sonographer, the patient did not exhibit a sonographic Murphy's sign  on examination.  Common bile duct:  Diameter: 5.5 mm.  Liver:  No focal lesion identified. Within normal limits in parenchymal echogenicity.  IMPRESSION: 1. Cholelithiasis without evidence to suggest acute cholecystitis.   Electronically Signed   By: Vinnie Langton M.D.   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 Pyelonephritis 1. Urine culture pending 2. Pt is not septic 3. Cont on empiric rocephin for now 2. Hepatitis 1. AST:ALT 2:1 2. ETOH levels pending, Tylenol level neg 3. Acute hepatitis panel pending 4. Elevated LFT's may be related to biliary sludge per below 5. Avoid hepatotoxic agents such as tylenol containing meds 6. Will hold home statin 3. Biliary sludge 1. Followed as an outpatient with consideration for surgery at some point per clinic notes 2. Alk phos not elevated 3. General Surgery consulted through ED 4. No biliary dilatation noted on imaging 4. Hx brain tumor 1. Appears stable 5. Fibromyalgia 1. Appears stable 2. Cont Lyrica (or equivalent) per home regimen 6. DVT prophylaxis 1. Heparin subQ 7. R ankle fracture 1. Pt to follow up w/ Ortho as outpt 2. S/p mechanical fall day before admission 8. Hx seizures 1. Stable 2. Cont keppra per home regimen  Code Status: Full (must indicate code status--if unknown or must be presumed, indicate so) Family Communication: Pt in room (indicate person spoken with, if applicable, with phone number  if by telephone) Disposition Plan: Pending (indicate anticipated LOS)  Time spent: 56mn  [facility], [facility] Pager [phone] If 7PM-7AM, please contact night-coverage [email or web address] Password [email or web address]7/26/2015, 4:52 PM

## 2013-07-31 NOTE — Progress Notes (Signed)
Orthopedic Tech Progress Note Patient Details:  [name] July 15, 1955 [id number]  Ortho Devices Type of Ortho Device: CAM walker Ortho Device/Splint Location: RLE Ortho Device/Splint Interventions: Ordered;Application   Braulio Bosch 07/31/2013, 4:37 PM

## 2013-07-31 NOTE — ED Provider Notes (Signed)
CSN: [id number]     Arrival date & time 07/31/13  1002 History   First MD Initiated Contact with Patient 07/31/13 1014     Chief Complaint  Patient presents with  . Ankle Pain    Right  . Abdominal Pain    LLQ     (Consider location/radiation/quality/duration/timing/severity/associated sxs/prior Treatment) HPI Comments: Patient with history of brain tumor status post resection now and remission, obesity, for myalgia, anemia presenting with right ankle pain since a fall yesterday morning. She was being helped to the bathroom by her husband when she tripped and fell backwards twisting her right ankle underneath her. Denies hitting head or losing consciousness. History of previous surgery on this ankle in the past. Denies any focal weakness, numbness or tingling. She also complains of diffuse abdominal pain has been ongoing for the past 2 weeks. She's been seen by general surgery and had a negative head scan in June. She was also recently treated for UTI and kidney stone. Denies any vomiting, shortness of breath, chest pain, dysuria, hematuria.  The history is provided by the patient and the spouse.    Past Medical History  Diagnosis Date  . Brain cancer     Frontal lobe, 1993 and 2005  . Migraines   . Morton neuroma   . Seizures   . Migraine   . Fibromyalgia   . Right fibular fracture 07/12/2011  . 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 Anemia 03/06/2013   Past Surgical History  Procedure Laterality Date  . Appendectomy  1976  . Excision morton's neuroma      right foot  . Brain cancer      1993 and 2005  . Orif ankle fracture  07/12/2011    Procedure: OPEN REDUCTION INTERNAL FIXATION (ORIF) ANKLE FRACTURE;  Surgeon: Johnny Bridge, MD;  Location: [facility];  Service: Orthopedics;  Laterality: Right;   Family History  Problem Relation Age of Onset  . Diabetes Mother   . Diabetes Brother   . Hypertension Mother    . Hypertension Father    History  Substance Use Topics  . Smoking status: Never Smoker   . Smokeless tobacco: Never Used  . Alcohol Use: No   OB History   Grav Para Term Preterm Abortions TAB SAB Ect Mult Living                 Review of Systems  Constitutional: Positive for activity change and appetite change. Negative for fever.  Eyes: Negative for visual disturbance.  Respiratory: Negative for cough, chest tightness and shortness of breath.   Cardiovascular: Negative for chest pain.  Gastrointestinal: Positive for nausea and abdominal pain. Negative for vomiting and diarrhea.  Genitourinary: Negative for dysuria, hematuria, vaginal bleeding and vaginal discharge.  Musculoskeletal: Positive for arthralgias and myalgias. Negative for back pain, neck pain and neck stiffness.  Skin: Negative for rash.  Neurological: Negative for dizziness, weakness and headaches.  A complete 10 system review of systems was obtained and all systems are negative except as noted in the HPI and PMH.      Allergies  Versed; Ciprofloxacin; Cyclobenzaprine; Methocarbamol; Metronidazole; and Penicillins  Home Medications   Prior to Admission medications   Medication Sig Start Date End Date Taking? Authorizing Provider  Calcium Citrate-Vitamin D (CITRACAL + D PO) Take 1 tablet by mouth daily. CA 1200mg -Vitamin D 1000iu   Yes Historical Provider, MD  DULoxetine (CYMBALTA) 60 MG capsule Take 60 mg by mouth daily.   Yes Historical Provider, MD  fenofibrate 160 MG tablet Take 160 mg by mouth daily.   Yes Historical Provider, MD  fish oil-omega-3 fatty acids 1000 MG capsule Take 1 g by mouth 2 (two) times daily.     Yes Historical Provider, MD  HYDROcodone-acetaminophen (NORCO/VICODIN) 5-325 MG per tablet Take 1-2 tablets by mouth every 4 (four) hours as needed for moderate pain.   Yes Historical Provider, MD  levETIRAcetam (KEPPRA) 500 MG tablet Take 1,500 mg by mouth 2 (two) times daily.    Yes  Historical Provider, MD  Linaclotide Rolan Lipa) 145 MCG CAPS capsule Take 1 capsule (145 mcg total) by mouth daily. 06/07/13  Yes Midge Minium, MD  meloxicam (MOBIC) 15 MG tablet Take 15 mg by mouth daily.   Yes Historical Provider, MD  methocarbamol (ROBAXIN) 500 MG tablet Take 500 mg by mouth every 8 (eight) hours as needed for muscle spasms.   Yes Historical Provider, MD  omeprazole (PRILOSEC) 20 MG capsule Take 1 capsule (20 mg total) by mouth daily. 07/12/13  Yes Midge Minium, MD  pregabalin (LYRICA) 150 MG capsule Take 300 mg by mouth daily.   Yes Historical Provider, MD  promethazine (PHENERGAN) 25 MG tablet Take 25 mg by mouth every 6 (six) hours as needed for nausea.  05/26/13  Yes Historical Provider, MD  rosuvastatin (CRESTOR) 10 MG tablet Take 10 mg by mouth 3 (three) times a week. Monday Wednesday and Friday   Yes Historical Provider, MD  traZODone (DESYREL) 50 MG tablet Take 1 tablet (50 mg total) by mouth at bedtime as needed for sleep. 07/11/13  Yes Midge Minium, MD   BP 111/79  Pulse 85  Temp(Src) 98 F (36.7 C) (Oral)  Resp 18  Ht 5\' 2"  (1.575 m)  Wt 194 lb 14.4 oz (88.406 kg)  BMI 35.64 kg/m2  SpO2 92% Physical Exam  Nursing note and vitals reviewed. Constitutional: She is oriented to person, place, and time. She appears well-developed and well-nourished. No distress.  HENT:  Head: Normocephalic and atraumatic.  Mouth/Throat: Oropharynx is clear and moist. No oropharyngeal exudate.  Eyes: Conjunctivae and EOM are normal. Pupils are equal, round, and reactive to light.  Neck: Normal range of motion. Neck supple.  No meningismus.  Cardiovascular: Normal rate, regular rhythm, normal heart sounds and intact distal pulses.   No murmur heard. Pulmonary/Chest: Effort normal and breath sounds normal. No respiratory distress.  Abdominal: Soft. There is tenderness. There is no rebound and no guarding.  Mild diffuse tenderness, worse on the right side. No CVA  tenderness  Musculoskeletal: Normal range of motion. She exhibits edema and tenderness.  Diffuse tenderness and edema to right ankle. Intact pulses.  Neurological: She is alert and oriented to person, place, and time. No cranial nerve deficit. She exhibits normal muscle tone. Coordination normal.  No ataxia on finger to nose bilaterally. No pronator drift. 5/5 strength throughout. CN 2-12 intact. Negative Romberg. Equal grip strength. Sensation intact. Gait is normal.   Skin: Skin is warm.  Psychiatric: She has a normal mood and affect. Her behavior is normal.    ED Course  Procedures (including critical care time) Labs Review Labs Reviewed  URINALYSIS, ROUTINE W REFLEX MICROSCOPIC - Abnormal; Notable for the following:    APPearance HAZY (*)    Hgb urine dipstick TRACE (*)    Bilirubin Urine SMALL (*)    Nitrite POSITIVE (*)  Leukocytes, UA MODERATE (*)    All other components within normal limits  CBC WITH DIFFERENTIAL - Abnormal; Notable for the following:    RDW 15.8 (*)    All other components within normal limits  COMPREHENSIVE METABOLIC PANEL - Abnormal; Notable for the following:    Potassium 5.8 (*)    Albumin 3.4 (*)    AST 730 (*)    ALT 325 (*)    Total Bilirubin 2.0 (*)    GFR calc non Af Amer 81 (*)    Anion gap 17 (*)    All other components within normal limits  HEPATIC FUNCTION PANEL - Abnormal; Notable for the following:    Albumin 3.3 (*)    AST 548 (*)    ALT 291 (*)    Bilirubin, Direct 0.6 (*)    All other components within normal limits  URINE MICROSCOPIC-ADD ON - Abnormal; Notable for the following:    Squamous Epithelial / LPF FEW (*)    Bacteria, UA MANY (*)    All other components within normal limits  URINE CULTURE  LIPASE, BLOOD  POTASSIUM  ACETAMINOPHEN LEVEL  ETHANOL  HEPATITIS PANEL, ACUTE  COMPREHENSIVE METABOLIC PANEL  CBC  PROTIME-INR  APTT    Imaging Review Ct Abdomen Pelvis Wo Contrast  07/31/2013   CLINICAL DATA:   Abdominal pain for the past 2 weeks more severe today.  EXAM: CT ABDOMEN AND PELVIS WITHOUT CONTRAST  TECHNIQUE: Multidetector CT imaging of the abdomen and pelvis was performed following the standard protocol without IV contrast.  COMPARISON:  CT of the abdomen and pelvis 06/04/2013.  FINDINGS: Lung Bases: There is either a trace amount of intermediate attenuation fluid or there is pleural thickening in the posterior aspect of the lower right hemithorax. Small amount of subsegmental atelectasis in the lower lobes of the lungs bilaterally.  Abdomen/Pelvis: Some amorphous high attenuation material is noted in the dependent portion of the gallbladder, favored to predominantly reflect some biliary sludge, although some small partially calcified gallstones are also possible. No current findings to suggest an acute cholecystitis at this time. The unenhanced appearance of the liver, pancreas, spleen, bilateral adrenal glands and the right kidney is unremarkable. The left kidney is mildly atrophic and there are some faint amorphous medullary calcifications within the left kidney.  Small volume of ascites. No pneumoperitoneum. No pathologic distention of small bowel. The uterus and ovaries are unremarkable in appearance. No lymphadenopathy identified within the abdomen or pelvis on today's non contrast CT examination. Urinary bladder is normal in appearance.  Musculoskeletal: There are no aggressive appearing lytic or blastic lesions noted in the visualized portions of the skeleton.  IMPRESSION: 1. There appears to be biliary sludge and potentially tiny gallstones in the gallbladder. No current findings to suggest acute cholecystitis at this time. 2. Trace amount of intermediate attenuation pleural fluid and/or pleural thickening in the posterior aspect of the lower right hemithorax. This is of uncertain etiology and significance. 3. Small volume of ascites. 4. Additional incidental findings, as above.   Electronically  Signed   By: Vinnie Langton M.D.   On: 07/31/2013 15:57   Dg Chest 2 View  07/31/2013   CLINICAL DATA:  ANKLE PAIN ABDOMINAL PAIN  EXAM: CHEST - 2 VIEW  COMPARISON:  03/06/2013  FINDINGS: Linear scarring or subsegmental atelectasis in the right mid lung as before. Left lung clear. Heart size normal. No effusion. Regional bones unremarkable.  IMPRESSION: 1. No acute disease   Electronically Signed  By: Arne Cleveland M.D.   On: 07/31/2013 14:17   Dg Tibia/fibula Right  07/31/2013   CLINICAL DATA:  ANKLE PAIN ABDOMINAL PAIN  EXAM: RIGHT TIBIA AND FIBULA - 2 VIEW  COMPARISON:  07/12/2011  FINDINGS: Stable postop changes in the distal fibula. No acute fracture or dislocation. Normal alignment and mineralization. No significant osseous degenerative change.  IMPRESSION: No acute disease   Electronically Signed   By: Arne Cleveland M.D.   On: 07/31/2013 12:26   Dg Ankle Complete Right  07/31/2013   CLINICAL DATA:  ANKLE PAIN ABDOMINAL PAIN  EXAM: RIGHT ANKLE - COMPLETE 3+ VIEW  COMPARISON:  07/12/2011  FINDINGS: Interval Removal of cast material. Stable plate and screw fixation of distal fibula. Small cortical avulsion injury from the medial malleolus, of uncertain chronicity. Ankle mortise intact. Normal mineralization and alignment. Calcaneal spurs at the plantar aponeurosis and Achilles tendon insertion.  IMPRESSION: 1. Medial malleolus cortical avulsion injury, age indeterminate. Correlate with point tenderness 2. Stable postop changes distal fibula. 3. Calcaneal spurs.   Electronically Signed   By: Arne Cleveland M.D.   On: 07/31/2013 12:25   US Abdomen Limited Ruq  07/31/2013   CLINICAL DATA:  Right upper quadrant abdominal pain.  EXAM: US ABDOMEN LIMITED - RIGHT UPPER QUADRANT  COMPARISON:  CT of the abdomen and pelvis 06/04/2013.  FINDINGS: Gallbladder:  Multiple echogenic foci with posterior acoustic shadowing throughout the gallbladder, compatible with calcified gallstones. Gallbladder is  moderately distended. Gallbladder wall thickness is normal at 2.8 mm. No abnormal pericholecystic fluid. Per report from the sonographer, the patient did not exhibit a sonographic Murphy's sign on examination.  Common bile duct:  Diameter: 5.5 mm.  Liver:  No focal lesion identified. Within normal limits in parenchymal echogenicity.  IMPRESSION: 1. Cholelithiasis without evidence to suggest acute cholecystitis.   Electronically Signed   By: Vinnie Langton M.D.   On: 07/31/2013 14:07     EKG Interpretation None      MDM   Final diagnoses:  Pyelonephritis  Avulsion fracture of medial malleolus of right tibia, closed, initial encounter  Transaminitis   Right ankle pain after rolling it yesterday. Progressively worsening right upper quadrant abdominal pain with recent negative HIDA scan. One episode of vomiting today. Denies hitting head or losing consciousness.  Ankle is neurovascularly intact. X-ray shows medial malleolus avulsion. Fibular hardware is stable.  Discussed with Dr. Marcelino Scot who agrees with fracture boot and outpatient followup.  Urinalysis is positive. Rocephin started. Urine culture sent. Ultrasound of the gallbladder shows multiple stones but no evidence of cholecystitis. LFTs elevation noted.  AST still remains greater than ALT. Potassium is normal on recheck. Unclear source of patient's of the elevation. She had similar but less pronounced elevation in may. CT at that time showed no significant abnormality. She denies any alcohol or acetaminophen use.  We'll recheck CT today to rule out infected kidney stone. CT today shows no urinary tract obstruciton. Biliary sludge with small amount of ascites.  Patient feels weak, BP soft, no vomiting in the ED.  Will admit for IVF, antibiotics, and further evaluation of transaminitis.   Ezequiel Essex, MD 07/31/13 1840

## 2013-07-31 NOTE — ED Provider Notes (Signed)
After speaking with Dr. Wyline Copas, he would like general surgery involved for consultation. I've spoken with Dr. Georgette Dover from general surgery. He will evaluate the patient and has recommended she be n.p.o. until evaluated by general surgery service.  Veryl Speak, MD 07/31/13 1726

## 2013-07-31 NOTE — ED Notes (Signed)
Pt c/o abd pain for 2 weeks that have gotten more severe today. Pt sts she has been seen by a Dr. Rosendo Gros and that it was her gallbladder and he wanted to do another test prior to having gallbladder taken out. Pt sts she fell yesterday evening and swelling has increased since yesterday. Pt sts pain is 8/10. Pt has been taken hydrocodone for pain yesterday but did not take anything today.

## 2013-07-31 NOTE — Progress Notes (Signed)
ED nurse unavailable to give me report at time.

## 2013-07-31 NOTE — ED Notes (Signed)
Pt unable to urinate for sample at this time. Pt instructed to call when she was able to void

## 2013-07-31 NOTE — Progress Notes (Signed)
Arrived to room 5w10 via stretcher from ED, A/Ox4, denies nausea vomiting,c/o right leg pain, instructed on room surroundings and how to get in touch with nurse/tech.

## 2013-07-31 NOTE — Progress Notes (Signed)
Orthopedic Tech Progress Note Patient Details:  [name] 15-Dec-1955 [id number]  Ortho Devices Type of Ortho Device: Postop shoe/boot Ortho Device/Splint Location: RLE Ortho Device/Splint Interventions: Ordered;Application   Braulio Bosch 07/31/2013, 4:04 PM

## 2013-07-31 NOTE — Progress Notes (Signed)
RECEIVED REPORT FROM [facility]  RN AT THIS TIME

## 2013-07-31 NOTE — ED Provider Notes (Signed)
[facility] Dr. Wyvonnia Dusky at shift change. Patient awaiting results of CT scan. This reveals biliary sludge and small volume ascites. I've spoken with Dr. Wyline Copas from the hospitalist service who agrees to admit the patient for workup of her elevated LFTs and treatment of pyelonephritis.  Veryl Speak, MD 07/31/13 [phone]

## 2013-08-01 ENCOUNTER — Encounter (HOSPITAL_COMMUNITY): Payer: Self-pay | Admitting: General Surgery

## 2013-08-01 DIAGNOSIS — K8020 Calculus of gallbladder without cholecystitis without obstruction: Secondary | ICD-10-CM

## 2013-08-01 DIAGNOSIS — R5383 Other fatigue: Secondary | ICD-10-CM

## 2013-08-01 DIAGNOSIS — R5381 Other malaise: Secondary | ICD-10-CM

## 2013-08-01 LAB — CBC
HEMATOCRIT: 31.3 % — AB (ref 36.0–46.0)
Hemoglobin: 10.2 g/dL — ABNORMAL LOW (ref 12.0–15.0)
MCH: 29.4 pg (ref 26.0–34.0)
MCHC: 32.6 g/dL (ref 30.0–36.0)
MCV: 90.2 fL (ref 78.0–100.0)
Platelets: 182 10*3/uL (ref 150–400)
RBC: 3.47 MIL/uL — ABNORMAL LOW (ref 3.87–5.11)
RDW: 15.9 % — ABNORMAL HIGH (ref 11.5–15.5)
WBC: 3.6 10*3/uL — ABNORMAL LOW (ref 4.0–10.5)

## 2013-08-01 LAB — COMPREHENSIVE METABOLIC PANEL
ALBUMIN: 2.8 g/dL — AB (ref 3.5–5.2)
ALK PHOS: 106 U/L (ref 39–117)
ALT: 322 U/L — AB (ref 0–35)
AST: 680 U/L — AB (ref 0–37)
Anion gap: 13 (ref 5–15)
BUN: 12 mg/dL (ref 6–23)
CALCIUM: 8.6 mg/dL (ref 8.4–10.5)
CO2: 22 mEq/L (ref 19–32)
Chloride: 109 mEq/L (ref 96–112)
Creatinine, Ser: 0.88 mg/dL (ref 0.50–1.10)
GFR calc non Af Amer: 73 mL/min — ABNORMAL LOW (ref >90)
GFR, EST AFRICAN AMERICAN: 84 mL/min — AB (ref >90)
Glucose, Bld: 96 mg/dL (ref 70–99)
POTASSIUM: 3.8 meq/L (ref 3.7–5.3)
SODIUM: 144 meq/L (ref 137–147)
Total Bilirubin: 1 mg/dL (ref 0.3–1.2)
Total Protein: 6 g/dL (ref 6.0–8.3)

## 2013-08-01 LAB — HEPATITIS PANEL, ACUTE
HCV Ab: NEGATIVE
HEP B S AG: NEGATIVE
Hep A IgM: NONREACTIVE
Hep B C IgM: NONREACTIVE

## 2013-08-01 LAB — APTT: aPTT: 46 seconds — ABNORMAL HIGH (ref 24–37)

## 2013-08-01 LAB — AMMONIA: AMMONIA: 47 umol/L (ref 11–60)

## 2013-08-01 LAB — PROTIME-INR
INR: 1.23 (ref 0.00–1.49)
Prothrombin Time: 15.5 seconds — ABNORMAL HIGH (ref 11.6–15.2)

## 2013-08-01 NOTE — Progress Notes (Signed)
PATIENT DETAILS Name: [name] Age: 58 y.o. Sex: female Date of Birth: 1955/05/03 Admit Date: 07/31/2013 Admitting Physician Donne Hazel, MD [id number] Birdie Riddle, MD  Subjective: Doing well, not complaining of pain, N/V/D.  States it burns when she urinates but denies frequency and urgency.  Hx of brain tumor and slow to respond at times, delayed word finding ability.  Daughter at bedside who states patient has been "out of it" for the past month.  Denies hitting her head when she fell Saturday and twisted her ankle.  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 Urine culture pending. Continue on empiric rocephin. 2. Pt is not septic 3. Ammonia levels ordered.   Hepatitis  1. AST:ALT 2:1 2. ETOH levels NEG, Tylenol level NEG 3. Acute hepatitis panel NEG 4. Elevated LFT's may be related to biliary sludge per below 5. Avoid hepatotoxic agents such as tylenol containing meds 6. Home statin held.  Biliary sludge  1. Alk phos not elevated 2. General Surgery consulted through ED 3. No biliary dilatation noted on imaging 4. General surgery believes patient is having symptoms from her gallstones and will plan on doing Lap Chole tomorrow.  Hx brain tumor  1. More confused than baseline as per daughter. Will consider getting head CT.  Fibromyalgia  1. Appears stable 2. Cont Lyrica (or equivalent) per home regimen  DVT prophylaxis  1. Heparin subQ  R ankle fracture  1. Pt to follow up w/ Ortho as outpt 2. S/p mechanical fall day before admission 3. Has on a leg brace  Hx seizures  1. Stable 2. Cont keppra per home regimen   Family Communication Young daughter at bedside.  Helping with hx of events.  Procedures:  None  CONSULTS:  general surgery  Time spent 40 minutes-which includes 50% of the time with  face-to-face with patient/ family and coordinating care related to the above assessment and plan.   MEDICATIONS: Scheduled Meds: . sodium chloride   Intravenous STAT  . cefTRIAXone (ROCEPHIN)  IV  1 g Intravenous Q24H  . DULoxetine  60 mg Oral Daily  . fenofibrate  160 mg Oral Daily  . heparin  5,000 Units Subcutaneous 3 times per day  . levETIRAcetam  1,500 mg Oral BID  . Linaclotide  145 mcg Oral Daily  . meloxicam  15 mg Oral Q breakfast  . pantoprazole  40 mg Oral Daily  . pregabalin  300 mg Oral Daily   Continuous Infusions:  PRN Meds:.morphine injection, ondansetron (ZOFRAN) IV, ondansetron  Antibiotics: Anti-infectives   Start     Dose/Rate Route Frequency Ordered Stop   08/01/13 1330  cefTRIAXone (ROCEPHIN) 1 g in dextrose 5 % 50 mL IVPB     1 g 100 mL/hr over 30 Minutes Intravenous Every 24 hours 07/31/13 1732     07/31/13 1330  cefTRIAXone (ROCEPHIN) 1 g in dextrose 5 % 50 mL IVPB     1 g 100 mL/hr over 30 Minutes Intravenous  Once 07/31/13 1327 07/31/13 1520       PHYSICAL EXAM: Vital signs in last 24 hours: Filed Vitals:   07/31/13 1615 07/31/13 1800 07/31/13 2143 08/01/13 0500  BP: 93/73 111/79 101/72 103/71  Pulse: 78 85 72 80  Temp:  98 F (36.7 C) 97.8 F (36.6 C) 98.7 F (37.1 C)  TempSrc:  Oral Oral Oral  Resp:  _0 Height:  _1  (1.575 m)    Weight:  88.406 kg (194 lb 14.4 oz)    SpO2: 93% 92% 92% 94%    Weight change:  Filed Weights   07/31/13 1800  Weight: 88.406 kg (194 lb 14.4 oz)   Body mass index is 35.64 kg/(m^2).   Gen Exam: Awake and alert with clear speech.  Oriented x 3 but slow to respond at times.  Neck: Supple, No JVD,  Chest: B/L Clear.  No crackles or wheezes noted CVS: S1 S2 Regular, no murmurs, gallops, or rubs noted. Abdomen: soft, BS +, non distended, tender to palpation more so in the lower quadrants than upper. Extremities: no edema, lower extremities warm to touch. Neurologic: CN 2-12 grossly intact,  strength and sensation intact. Skin: No Rash noted Psychiatric: normal mood and affect.   Intake/Output from previous day:  Intake/Output Summary (Last 24 hours) at 08/01/13 0826 Last data filed at 07/31/13 2144  Gross per 24 hour  Intake    120 ml  Output      0 ml  Net    120 ml     LAB RESULTS: CBC  Recent Labs Lab 07/31/13 1000 08/01/13 0620  WBC 4.7 3.6*  HGB 13.2 10.2*  HCT 39.9 31.3*  PLT 184 182  MCV 87.9 90.2  MCH 29.1 29.4  MCHC 33.1 32.6  RDW 15.8* 15.9*  LYMPHSABS 1.1  --   MONOABS 0.5  --   EOSABS 0.1  --   BASOSABS 0.0  --     Chemistries   Recent Labs Lab 07/31/13 1000 07/31/13 1443 08/01/13 0620  NA 137  --  144  K 5.8* 4.2 3.8  CL 100  --  109  CO2 20  --  22  GLUCOSE 80  --  96  BUN 13  --  12  CREATININE 0.80  --  0.88  CALCIUM 9.8  --  8.6    CBG: No results found for this basename: GLUCAP,  in the last 168 hours  GFR Estimated Creatinine Clearance: 74.6 ml/min (by C-G formula based on Cr of 0.88).  Coagulation profile  Recent Labs Lab 08/01/13 0620  INR 1.23     Recent Labs  07/31/13 1000  LIPASE 18    RADIOLOGY STUDIES/RESULTS: Ct Abdomen Pelvis Wo Contrast  07/31/2013   CLINICAL DATA:  Abdominal pain for the past 2 weeks more severe today.  EXAM: CT ABDOMEN AND PELVIS WITHOUT CONTRAST  TECHNIQUE: Multidetector CT imaging of the abdomen and pelvis was performed following the standard protocol without IV contrast.  COMPARISON:  CT of the abdomen and pelvis 06/04/2013.  FINDINGS: Lung Bases: There is either a trace amount of intermediate attenuation fluid or there is pleural thickening in the posterior aspect of the lower right hemithorax. Small amount of subsegmental atelectasis in the lower lobes of the lungs bilaterally.  Abdomen/Pelvis: Some amorphous high attenuation material is noted in the dependent portion of the gallbladder, favored to predominantly reflect some biliary sludge, although some small partially  calcified gallstones are also possible. No current findings to suggest an acute cholecystitis at this time. The unenhanced appearance of the liver, pancreas, spleen, bilateral adrenal glands and the right kidney is unremarkable. The left kidney is mildly atrophic and there are some faint amorphous medullary calcifications within the left kidney.  Small volume of ascites. No pneumoperitoneum. No pathologic distention of small bowel. The uterus and ovaries are unremarkable  in appearance. No lymphadenopathy identified within the abdomen or pelvis on today's non contrast CT examination. Urinary bladder is normal in appearance.  Musculoskeletal: There are no aggressive appearing lytic or blastic lesions noted in the visualized portions of the skeleton.  IMPRESSION: 1. There appears to be biliary sludge and potentially tiny gallstones in the gallbladder. No current findings to suggest acute cholecystitis at this time. 2. Trace amount of intermediate attenuation pleural fluid and/or pleural thickening in the posterior aspect of the lower right hemithorax. This is of uncertain etiology and significance. 3. Small volume of ascites. 4. Additional incidental findings, as above.   Electronically Signed   By: Vinnie Langton M.D.   On: 07/31/2013 15:57   Dg Chest 2 View  07/31/2013   CLINICAL DATA:  ANKLE PAIN ABDOMINAL PAIN  EXAM: CHEST - 2 VIEW  COMPARISON:  03/06/2013  FINDINGS: Linear scarring or subsegmental atelectasis in the right mid lung as before. Left lung clear. Heart size normal. No effusion. Regional bones unremarkable.  IMPRESSION: 1. No acute disease   Electronically Signed   By: Arne Cleveland M.D.   On: 07/31/2013 14:17   Dg Tibia/fibula Right  07/31/2013   CLINICAL DATA:  ANKLE PAIN ABDOMINAL PAIN  EXAM: RIGHT TIBIA AND FIBULA - 2 VIEW  COMPARISON:  07/12/2011  FINDINGS: Stable postop changes in the distal fibula. No acute fracture or dislocation. Normal alignment and mineralization. No significant  osseous degenerative change.  IMPRESSION: No acute disease   Electronically Signed   By: Arne Cleveland M.D.   On: 07/31/2013 12:26   Dg Ankle Complete Right  07/31/2013   CLINICAL DATA:  ANKLE PAIN ABDOMINAL PAIN  EXAM: RIGHT ANKLE - COMPLETE 3+ VIEW  COMPARISON:  07/12/2011  FINDINGS: Interval Removal of cast material. Stable plate and screw fixation of distal fibula. Small cortical avulsion injury from the medial malleolus, of uncertain chronicity. Ankle mortise intact. Normal mineralization and alignment. Calcaneal spurs at the plantar aponeurosis and Achilles tendon insertion.  IMPRESSION: 1. Medial malleolus cortical avulsion injury, age indeterminate. Correlate with point tenderness 2. Stable postop changes distal fibula. 3. Calcaneal spurs.   Electronically Signed   By: Arne Cleveland M.D.   On: 07/31/2013 12:25   Dg Abd 1 View  07/12/2013   CLINICAL DATA:  Ureteral calculus.  EXAM: ABDOMEN - 1 VIEW  COMPARISON:  Radiograph, 06/10/2013 and CT, 06/04/2013.  FINDINGS: No convincing renal or ureteral stone.  Normal bowel gas pattern. Soft tissues are unremarkable. Mild degenerative changes of the lower lumbar spine.  IMPRESSION: 1. No radiographic evidence of a renal or ureteral stone. No acute findings.   Electronically Signed   By: Lajean Manes M.D.   On: 07/12/2013 15:47   Nm Hepato W/eject Fract  07/12/2013   CLINICAL DATA:  Right upper quadrant pain  EXAM: NUCLEAR MEDICINE HEPATOBILIARY IMAGING WITH GALLBLADDER EF  TECHNIQUE: Sequential images of the abdomen were obtained out to 60 minutes following intravenous administration of radiopharmaceutical. After slow intravenous infusion of 1.8 micrograms Cholecystokinin, gallbladder ejection fraction was determined.  RADIOPHARMACEUTICALS:  5.5 Millicurie YT-03T Choletec  COMPARISON:  None.  FINDINGS: There is immediate homogeneous uptake of radiotracer in the liver. Filling of the gallbladder begins at 30 minutes. Radiotracer uptake is present in the  small bowel at 50 minutes.  At the end of 1-hour, there is relatively good clearance of activity from the liver.  When gallbladder filling was complete, the patient was given an IV infusion of CCK. At 30 min the  gallbladder ejection fraction measures 85.6%. At 30 min, normal ejection fraction is greater than 30%.  The patient did not experience symptoms during CCK infusion.  IMPRESSION: 1. Normal hepatobiliary scan. 2. Normal gallbladder ejection fraction.   Electronically Signed   By: Kathreen Devoid   On: 07/12/2013 15:30   US Abdomen Limited Ruq  07/31/2013   CLINICAL DATA:  Right upper quadrant abdominal pain.  EXAM: US ABDOMEN LIMITED - RIGHT UPPER QUADRANT  COMPARISON:  CT of the abdomen and pelvis 06/04/2013.  FINDINGS: Gallbladder:  Multiple echogenic foci with posterior acoustic shadowing throughout the gallbladder, compatible with calcified gallstones. Gallbladder is moderately distended. Gallbladder wall thickness is normal at 2.8 mm. No abnormal pericholecystic fluid. Per report from the sonographer, the patient did not exhibit a sonographic Murphy's sign on examination.  Common bile duct:  Diameter: 5.5 mm.  Liver:  No focal lesion identified. Within normal limits in parenchymal echogenicity.  IMPRESSION: 1. Cholelithiasis without evidence to suggest acute cholecystitis.   Electronically Signed   By: Vinnie Langton M.D.   On: 07/31/2013 14:07    Janifer Adie, PA-S, [facility]  [facility] Pager:[phone]  If 7PM-7AM, please contact night-coverage [email or web address] Password [email or web address] 08/01/2013, 8:26 AM   LOS: 1 day   **Disclaimer: This note may have been dictated with voice recognition software. Similar sounding words can inadvertently be transcribed and this note may contain transcription errors which may not have been corrected upon publication of note.**  Pt seen and examined. See previous progress note. Agree with assessment and plan per above.

## 2013-08-01 NOTE — Progress Notes (Signed)
[facility] PROGRESS NOTE  [name] JTT:[id number] DOB: 07-06-1955 DOA: 07/31/2013 PCP: Annye Asa, MD  Assessment/Plan: 1. Pyelonephritis  1. Urine culture still pending 2. Pt is not septic 3. On empiric rocephin for now 2. Hepatitis  1. AST:ALT 2:1 2. ETOH levels pending, Tylenol level neg 3. Acute hepatitis panel neg 4. Elevated LFT's likely related to biliary sludge per below 5. Avoid hepatotoxic agents such as tylenol containing meds 6. Will hold home statin secondary to elevated LFT's 3. Biliary sludge  1. Had been followed as an outpatient with consideration for surgery at some point per clinic notes 2. Alk phos not elevated 3. General Surgery following 4. Plans for cholecystectomy tomorrow 4. Hx brain tumor  1. Appears stable 5. Fibromyalgia  1. Appears stable 2. Cont Lyrica (or equivalent) per home regimen 6. DVT prophylaxis  1. Heparin subQ 7. R ankle fracture  1. Pt to follow up w/ Ortho as outpt 2. S/p mechanical fall day before admission 8. Hx seizures  1. Stable 2. Cont keppra per home regimen  Code Status: Full Family Communication: Pt and grand daughter in room Disposition Plan: Pending   Consultants:  Surgery  Procedures:    Antibiotics:  Rocephin 7/26>>>  HPI/Subjective: Complains of continued   Objective: Filed Vitals:   07/31/13 1615 07/31/13 1800 07/31/13 2143 08/01/13 0500  BP: 93/73 111/79 101/72 103/71  Pulse: 78 85 72 80  Temp:  98 F (36.7 C) 97.8 F (36.6 C) 98.7 F (37.1 C)  TempSrc:  Oral Oral Oral  Resp:  _0 Height:  _1  (1.575 m)    Weight:  88.406 kg (194 lb 14.4 oz)    SpO2: 93% 92% 92% 94%    Intake/Output Summary (Last 24 hours) at 08/01/13 1201 Last data filed at 07/31/13 2144  Gross per 24 hour  Intake    120 ml  Output      0 ml  Net    120 ml   Filed Weights   07/31/13 1800  Weight: 88.406 kg (194 lb 14.4 oz)    Exam:   General:  Awake, in nad  Cardiovascular:  regular, s1, s2  Respiratory: normal resp effort, no wheezing  Abdomen: soft, RUQ pain  Musculoskeletal: perfused, no clubbing   Data Reviewed: Basic Metabolic Panel:  Recent Labs Lab 07/31/13 1000 07/31/13 1443 08/01/13 0620  NA 137  --  144  K 5.8* 4.2 3.8  CL 100  --  109  CO2 20  --  22  GLUCOSE 80  --  96  BUN 13  --  12  CREATININE 0.80  --  0.88  CALCIUM 9.8  --  8.6   Liver Function Tests:  Recent Labs Lab 07/31/13 1000 07/31/13 1443 08/01/13 0620  AST 730* 548* 680*  ALT 325* 291* 322*  ALKPHOS 91 89 106  BILITOT 2.0* 1.2 1.0  PROT 7.7 7.0 6.0  ALBUMIN 3.4* 3.3* 2.8*    Recent Labs Lab 07/31/13 1000  LIPASE 18    Recent Labs Lab 08/01/13 0915  AMMONIA 47   CBC:  Recent Labs Lab 07/31/13 1000 08/01/13 0620  WBC 4.7 3.6*  NEUTROABS 3.0  --   HGB 13.2 10.2*  HCT 39.9 31.3*  MCV 87.9 90.2  PLT 184 182   Cardiac Enzymes: No results found for this basename: CKTOTAL, CKMB, CKMBINDEX, TROPONINI,  in the last 168 hours BNP (last 3 results) No results found for this basename: PROBNP,  in the last  8760 hours CBG: No results found for this basename: GLUCAP,  in the last 168 hours  No results found for this or any previous visit (from the past 240 hour(s)).   Studies: Ct Abdomen Pelvis Wo Contrast  07/31/2013   CLINICAL DATA:  Abdominal pain for the past 2 weeks more severe today.  EXAM: CT ABDOMEN AND PELVIS WITHOUT CONTRAST  TECHNIQUE: Multidetector CT imaging of the abdomen and pelvis was performed following the standard protocol without IV contrast.  COMPARISON:  CT of the abdomen and pelvis 06/04/2013.  FINDINGS: Lung Bases: There is either a trace amount of intermediate attenuation fluid or there is pleural thickening in the posterior aspect of the lower right hemithorax. Small amount of subsegmental atelectasis in the lower lobes of the lungs bilaterally.  Abdomen/Pelvis: Some amorphous high attenuation material is noted in the dependent  portion of the gallbladder, favored to predominantly reflect some biliary sludge, although some small partially calcified gallstones are also possible. No current findings to suggest an acute cholecystitis at this time. The unenhanced appearance of the liver, pancreas, spleen, bilateral adrenal glands and the right kidney is unremarkable. The left kidney is mildly atrophic and there are some faint amorphous medullary calcifications within the left kidney.  Small volume of ascites. No pneumoperitoneum. No pathologic distention of small bowel. The uterus and ovaries are unremarkable in appearance. No lymphadenopathy identified within the abdomen or pelvis on today's non contrast CT examination. Urinary bladder is normal in appearance.  Musculoskeletal: There are no aggressive appearing lytic or blastic lesions noted in the visualized portions of the skeleton.  IMPRESSION: 1. There appears to be biliary sludge and potentially tiny gallstones in the gallbladder. No current findings to suggest acute cholecystitis at this time. 2. Trace amount of intermediate attenuation pleural fluid and/or pleural thickening in the posterior aspect of the lower right hemithorax. This is of uncertain etiology and significance. 3. Small volume of ascites. 4. Additional incidental findings, as above.   Electronically Signed   By: Vinnie Langton M.D.   On: 07/31/2013 15:57   Dg Chest 2 View  07/31/2013   CLINICAL DATA:  ANKLE PAIN ABDOMINAL PAIN  EXAM: CHEST - 2 VIEW  COMPARISON:  03/06/2013  FINDINGS: Linear scarring or subsegmental atelectasis in the right mid lung as before. Left lung clear. Heart size normal. No effusion. Regional bones unremarkable.  IMPRESSION: 1. No acute disease   Electronically Signed   By: Arne Cleveland M.D.   On: 07/31/2013 14:17   Dg Tibia/fibula Right  07/31/2013   CLINICAL DATA:  ANKLE PAIN ABDOMINAL PAIN  EXAM: RIGHT TIBIA AND FIBULA - 2 VIEW  COMPARISON:  07/12/2011  FINDINGS: Stable postop changes  in the distal fibula. No acute fracture or dislocation. Normal alignment and mineralization. No significant osseous degenerative change.  IMPRESSION: No acute disease   Electronically Signed   By: Arne Cleveland M.D.   On: 07/31/2013 12:26   Dg Ankle Complete Right  07/31/2013   CLINICAL DATA:  ANKLE PAIN ABDOMINAL PAIN  EXAM: RIGHT ANKLE - COMPLETE 3+ VIEW  COMPARISON:  07/12/2011  FINDINGS: Interval Removal of cast material. Stable plate and screw fixation of distal fibula. Small cortical avulsion injury from the medial malleolus, of uncertain chronicity. Ankle mortise intact. Normal mineralization and alignment. Calcaneal spurs at the plantar aponeurosis and Achilles tendon insertion.  IMPRESSION: 1. Medial malleolus cortical avulsion injury, age indeterminate. Correlate with point tenderness 2. Stable postop changes distal fibula. 3. Calcaneal spurs.   Electronically Signed  By: Arne Cleveland M.D.   On: 07/31/2013 12:25   US Abdomen Limited Ruq  07/31/2013   CLINICAL DATA:  Right upper quadrant abdominal pain.  EXAM: US ABDOMEN LIMITED - RIGHT UPPER QUADRANT  COMPARISON:  CT of the abdomen and pelvis 06/04/2013.  FINDINGS: Gallbladder:  Multiple echogenic foci with posterior acoustic shadowing throughout the gallbladder, compatible with calcified gallstones. Gallbladder is moderately distended. Gallbladder wall thickness is normal at 2.8 mm. No abnormal pericholecystic fluid. Per report from the sonographer, the patient did not exhibit a sonographic Murphy's sign on examination.  Common bile duct:  Diameter: 5.5 mm.  Liver:  No focal lesion identified. Within normal limits in parenchymal echogenicity.  IMPRESSION: 1. Cholelithiasis without evidence to suggest acute cholecystitis.   Electronically Signed   By: Vinnie Langton M.D.   On: 07/31/2013 14:07    Scheduled Meds: . sodium chloride   Intravenous STAT  . cefTRIAXone (ROCEPHIN)  IV  1 g Intravenous Q24H  . DULoxetine  60 mg Oral Daily  .  fenofibrate  160 mg Oral Daily  . heparin  5,000 Units Subcutaneous 3 times per day  . levETIRAcetam  1,500 mg Oral BID  . Linaclotide  145 mcg Oral Daily  . meloxicam  15 mg Oral Q breakfast  . pantoprazole  40 mg Oral Daily  . 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

## 2013-08-01 NOTE — Consult Note (Signed)
KIZZEY ARMBRUST [birth year]  [id number].   Primary Care MD: Dr. Neena Rhymes Requesting MD: Dr. Rickey Barbara Chief Complaint/Reason for Consult: Gallstones HPI: This is a 58 year old female who has a history of malignant brain tumor. She was recently seen in our office by Dr. Axel Filler for evaluation of vague abdominal pain and findings of gallstones. She was sent for a HIDA scan. This revealed no evidence of cholecystitis and a normal ejection fraction. Apparently the patient continues to have recurring falls. The patient is unable to provide a history for me this morning. She stated initially that she has no abdominal pain and the reason she is in the hospital is for her foot that she possibly broke when she fell yesterday. Upon arrival, her labs revealed an elevated AST and ALT in the 700s and 400s. Her total bilirubin was found to be 2. These are trending down today. She has a negative hepatitis panel. She had an ultrasound and a CT scan that did not reveal any evidence of cholecystitis suggest sludge and gallstones. We have been asked evaluate her for further recommendations.  ROS please see history of present illness otherwise all other systems are currently negative. The patient seems a little confused today and is difficult to really obtain a good history from.  Family History  Problem Relation Age of Onset  . Diabetes Mother   . Diabetes Brother   . Hypertension Mother   . Hypertension Father     Past Medical History  Diagnosis Date  . Brain cancer     Frontal lobe, 1993 and 2005  . Migraines   . Morton neuroma   . Seizures   . Migraine   . Fibromyalgia   . Right fibular fracture 07/12/2011  . 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 Anemia 03/06/2013    Past Surgical History  Procedure Laterality Date  . Appendectomy  1976  . Excision morton's neuroma      right foot  . Brain cancer      1993 and  2005  . Orif ankle fracture  07/12/2011    Procedure: OPEN REDUCTION INTERNAL FIXATION (ORIF) ANKLE FRACTURE;  Surgeon: Eulas Post, MD;  Location: [facility] OR;  Service: Orthopedics;  Laterality: Right;    Social History:  reports that she has never smoked. She has never used smokeless tobacco. She reports that she does not drink alcohol or use illicit drugs.  Allergies:  Allergies  Allergen Reactions  . Versed [Midazolam] Other (See Comments) and Anaphylaxis    Severe respiratory depression, had to be masked in preop holding.  . Ciprofloxacin     REACTION: NAUSEA AND VOMITING  . Cyclobenzaprine Itching    Other reaction(s): Itching / Pruritis (ALLERGY/intolerance)  . Methocarbamol     REACTION: itching Patient currently tolerating it as its on her home med list.  . Metronidazole   . Penicillins Rash    REACTION: RASH    Medications Prior to Admission  Medication Sig Dispense Refill  . Calcium Citrate-Vitamin D (CITRACAL + D PO) Take 1 tablet by mouth daily. CA 1200mg -Vitamin D 1000iu      . DULoxetine (CYMBALTA) 60 MG capsule Take 60 mg by mouth daily.      . fenofibrate 160 MG tablet Take 160 mg by mouth daily.      . fish oil-omega-3 fatty acids 1000 MG capsule Take 1 g by mouth 2 (two)  times daily.        Marland Kitchen HYDROcodone-acetaminophen (NORCO/VICODIN) 5-325 MG per tablet Take 1-2 tablets by mouth every 4 (four) hours as needed for moderate pain.      Marland Kitchen levETIRAcetam (KEPPRA) 500 MG tablet Take 1,500 mg by mouth 2 (two) times daily.       . Linaclotide (LINZESS) 145 MCG CAPS capsule Take 1 capsule (145 mcg total) by mouth daily.  30 capsule  3  . meloxicam (MOBIC) 15 MG tablet Take 15 mg by mouth daily.      . methocarbamol (ROBAXIN) 500 MG tablet Take 500 mg by mouth every 8 (eight) hours as needed for muscle spasms.      Marland Kitchen omeprazole (PRILOSEC) 20 MG capsule Take 1 capsule (20 mg total) by mouth daily.  30 capsule  2  . pregabalin (LYRICA) 150 MG capsule Take 300 mg by mouth daily.       . promethazine (PHENERGAN) 25 MG tablet Take 25 mg by mouth every 6 (six) hours as needed for nausea.       . rosuvastatin (CRESTOR) 10 MG tablet Take 10 mg by mouth 3 (three) times a week. Monday Wednesday and Friday      . traZODone (DESYREL) 50 MG tablet Take 1 tablet (50 mg total) by mouth at bedtime as needed for sleep.  30 tablet  3    Blood pressure 103/71, pulse 80, temperature 98.7 F (37.1 C), temperature source Oral, resp. rate 20, height 5\' 2"  (1.575 m), weight 194 lb 14.4 oz (88.406 kg), SpO2 94.00%. Physical Exam: General: pleasant, WD, WN white female who is laying in bed in NAD HEENT: head is normocephalic, atraumatic.  Sclera are noninjected.  PERRL.  Ears and nose without any masses or lesions.  Mouth is pink and moist Heart: regular, rate, and rhythm.  Normal s1,s2. No obvious murmurs, gallops, or rubs noted.  Palpable radial and pedal pulses bilaterally Lungs: CTAB, no wheezes, rhonchi, or rales noted.  Respiratory effort nonlabored Abd: soft, NT, ND, +BS, no masses, hernias, or organomegaly MS: all 4 extremities are symmetrical with no cyanosis, clubbing, or edema. Skin: warm and dry with no masses, lesions, or rashes Psych: Patient is very slow to respond. She seems a bit confused and initially she tells me she doesn't have any abdominal pain and then later in the conversation she admits to having some abdominal pain and then gets upset because she thinks I'm trying to confuse her by clarifying whether she actually has abdominal pain or not.    Results for orders placed during the hospital encounter of 07/31/13 (from the past 48 hour(s))  CBC WITH DIFFERENTIAL     Status: Abnormal   Collection Time    07/31/13 10:00 AM      Result Value Ref Range   WBC 4.7  4.0 - 10.5 K/uL   RBC 4.54  3.87 - 5.11 MIL/uL   Hemoglobin 13.2  12.0 - 15.0 g/dL   HCT 72.5  36.6 - 44.0 %   MCV 87.9  78.0 - 100.0 fL   MCH 29.1  26.0 - 34.0 pg   MCHC 33.1  30.0 - 36.0 g/dL   RDW 34.7  (*) 42.5 - 15.5 %   Platelets 184  150 - 400 K/uL   Neutrophils Relative % 63  43 - 77 %   Neutro Abs 3.0  1.7 - 7.7 K/uL   Lymphocytes Relative 24  12 - 46 %   Lymphs Abs 1.1  0.7 -  4.0 K/uL   Monocytes Relative 11  3 - 12 %   Monocytes Absolute 0.5  0.1 - 1.0 K/uL   Eosinophils Relative 2  0 - 5 %   Eosinophils Absolute 0.1  0.0 - 0.7 K/uL   Basophils Relative 0  0 - 1 %   Basophils Absolute 0.0  0.0 - 0.1 K/uL  COMPREHENSIVE METABOLIC PANEL     Status: Abnormal   Collection Time    07/31/13 10:00 AM      Result Value Ref Range   Sodium 137  137 - 147 mEq/L   Potassium 5.8 (*) 3.7 - 5.3 mEq/L   Comment: HEMOLYSIS AT THIS LEVEL MAY AFFECT RESULT   Chloride 100  96 - 112 mEq/L   CO2 20  19 - 32 mEq/L   Glucose, Bld 80  70 - 99 mg/dL   BUN 13  6 - 23 mg/dL   Creatinine, Ser 9.14  0.50 - 1.10 mg/dL   Calcium 9.8  8.4 - 78.2 mg/dL   Total Protein 7.7  6.0 - 8.3 g/dL   Albumin 3.4 (*) 3.5 - 5.2 g/dL   AST 956 (*) 0 - 37 U/L   ALT 325 (*) 0 - 35 U/L   Comment: HEMOLYSIS AT THIS LEVEL MAY AFFECT RESULT   Alkaline Phosphatase 91  39 - 117 U/L   Comment: HEMOLYSIS AT THIS LEVEL MAY AFFECT RESULT   Total Bilirubin 2.0 (*) 0.3 - 1.2 mg/dL   GFR calc non Af Amer 81 (*) >90 mL/min   GFR calc Af Amer >90  >90 mL/min   Comment: (NOTE)     The eGFR has been calculated using the CKD EPI equation.     This calculation has not been validated in all clinical situations.     eGFR's persistently <90 mL/min signify possible Chronic Kidney     Disease.   Anion gap 17 (*) 5 - 15  LIPASE, BLOOD     Status: None   Collection Time    07/31/13 10:00 AM      Result Value Ref Range   Lipase 18  11 - 59 U/L  URINALYSIS, ROUTINE W REFLEX MICROSCOPIC     Status: Abnormal   Collection Time    07/31/13 12:57 PM      Result Value Ref Range   Color, Urine YELLOW  YELLOW   APPearance HAZY (*) CLEAR   Specific Gravity, Urine 1.010  1.005 - 1.030   pH 6.0  5.0 - 8.0   Glucose, UA NEGATIVE  NEGATIVE  mg/dL   Hgb urine dipstick TRACE (*) NEGATIVE   Bilirubin Urine SMALL (*) NEGATIVE   Ketones, ur NEGATIVE  NEGATIVE mg/dL   Protein, ur NEGATIVE  NEGATIVE mg/dL   Urobilinogen, UA 1.0  0.0 - 1.0 mg/dL   Nitrite POSITIVE (*) NEGATIVE   Leukocytes, UA MODERATE (*) NEGATIVE  URINE MICROSCOPIC-ADD ON     Status: Abnormal   Collection Time    07/31/13 12:57 PM      Result Value Ref Range   Squamous Epithelial / LPF FEW (*) RARE   WBC, UA TOO NUMEROUS TO COUNT  <3 WBC/hpf   RBC / HPF 0-2  <3 RBC/hpf   Bacteria, UA MANY (*) RARE  POTASSIUM     Status: None   Collection Time    07/31/13  2:43 PM      Result Value Ref Range   Potassium 4.2  3.7 - 5.3 mEq/L  HEPATIC FUNCTION PANEL  Status: Abnormal   Collection Time    07/31/13  2:43 PM      Result Value Ref Range   Total Protein 7.0  6.0 - 8.3 g/dL   Albumin 3.3 (*) 3.5 - 5.2 g/dL   AST 782 (*) 0 - 37 U/L   ALT 291 (*) 0 - 35 U/L   Alkaline Phosphatase 89  39 - 117 U/L   Total Bilirubin 1.2  0.3 - 1.2 mg/dL   Bilirubin, Direct 0.6 (*) 0.0 - 0.3 mg/dL   Indirect Bilirubin 0.6  0.3 - 0.9 mg/dL  ACETAMINOPHEN LEVEL     Status: None   Collection Time    07/31/13  2:43 PM      Result Value Ref Range   Acetaminophen (Tylenol), Serum <15.0  10 - 30 ug/mL   Comment:            THERAPEUTIC CONCENTRATIONS VARY     SIGNIFICANTLY. A RANGE OF 10-30     ug/mL MAY BE AN EFFECTIVE     CONCENTRATION FOR MANY PATIENTS.     HOWEVER, SOME ARE BEST TREATED     AT CONCENTRATIONS OUTSIDE THIS     RANGE.     ACETAMINOPHEN CONCENTRATIONS     >150 ug/mL AT 4 HOURS AFTER     INGESTION AND >50 ug/mL AT 12     HOURS AFTER INGESTION ARE     OFTEN ASSOCIATED WITH TOXIC     REACTIONS.  HEPATITIS PANEL, ACUTE     Status: None   Collection Time    07/31/13  5:06 PM      Result Value Ref Range   Hepatitis B Surface Ag NEGATIVE  NEGATIVE   HCV Ab NEGATIVE  NEGATIVE   Hep A IgM NON REACTIVE  NON REACTIVE   Hep B C IgM NON REACTIVE  NON REACTIVE    Comment: (NOTE)     High levels of Hepatitis B Core IgM antibody are detectable     during the acute stage of Hepatitis B. This antibody is used     to differentiate current from past HBV infection.     Performed at [facility]  ETHANOL     Status: None   Collection Time    07/31/13  5:06 PM      Result Value Ref Range   Alcohol, Ethyl (B) <11  0 - 11 mg/dL   Comment:            LOWEST DETECTABLE LIMIT FOR     SERUM ALCOHOL IS 11 mg/dL     FOR MEDICAL PURPOSES ONLY  COMPREHENSIVE METABOLIC PANEL     Status: Abnormal   Collection Time    08/01/13  6:20 AM      Result Value Ref Range   Sodium 144  137 - 147 mEq/L   Potassium 3.8  3.7 - 5.3 mEq/L   Chloride 109  96 - 112 mEq/L   CO2 22  19 - 32 mEq/L   Glucose, Bld 96  70 - 99 mg/dL   BUN 12  6 - 23 mg/dL   Creatinine, Ser 9.56  0.50 - 1.10 mg/dL   Calcium 8.6  8.4 - 21.3 mg/dL   Total Protein 6.0  6.0 - 8.3 g/dL   Albumin 2.8 (*) 3.5 - 5.2 g/dL   AST 086 (*) 0 - 37 U/L   ALT 322 (*) 0 - 35 U/L   Alkaline Phosphatase 106  39 - 117 U/L   Total  Bilirubin 1.0  0.3 - 1.2 mg/dL   GFR calc non Af Amer 73 (*) >90 mL/min   GFR calc Af Amer 84 (*) >90 mL/min   Comment: (NOTE)     The eGFR has been calculated using the CKD EPI equation.     This calculation has not been validated in all clinical situations.     eGFR's persistently <90 mL/min signify possible Chronic Kidney     Disease.   Anion gap 13  5 - 15  CBC     Status: Abnormal   Collection Time    08/01/13  6:20 AM      Result Value Ref Range   WBC 3.6 (*) 4.0 - 10.5 K/uL   RBC 3.47 (*) 3.87 - 5.11 MIL/uL   Hemoglobin 10.2 (*) 12.0 - 15.0 g/dL   Comment: DELTA CHECK NOTED     SPECIMEN CHECKED FOR CLOTS     REPEATED TO VERIFY   HCT 31.3 (*) 36.0 - 46.0 %   MCV 90.2  78.0 - 100.0 fL   MCH 29.4  26.0 - 34.0 pg   MCHC 32.6  30.0 - 36.0 g/dL   RDW 16.1 (*) 09.6 - 04.5 %   Platelets 182  150 - 400 K/uL  PROTIME-INR     Status: Abnormal   Collection Time     08/01/13  6:20 AM      Result Value Ref Range   Prothrombin Time 15.5 (*) 11.6 - 15.2 seconds   INR 1.23  0.00 - 1.49  APTT     Status: Abnormal   Collection Time    08/01/13  6:20 AM      Result Value Ref Range   aPTT 46 (*) 24 - 37 seconds   Comment:            IF BASELINE aPTT IS ELEVATED,     SUGGEST PATIENT RISK ASSESSMENT     BE USED TO DETERMINE APPROPRIATE     ANTICOAGULANT THERAPY.   Ct Abdomen Pelvis Wo Contrast  07/31/2013   CLINICAL DATA:  Abdominal pain for the past 2 weeks more severe today.  EXAM: CT ABDOMEN AND PELVIS WITHOUT CONTRAST  TECHNIQUE: Multidetector CT imaging of the abdomen and pelvis was performed following the standard protocol without IV contrast.  COMPARISON:  CT of the abdomen and pelvis 06/04/2013.  FINDINGS: Lung Bases: There is either a trace amount of intermediate attenuation fluid or there is pleural thickening in the posterior aspect of the lower right hemithorax. Small amount of subsegmental atelectasis in the lower lobes of the lungs bilaterally.  Abdomen/Pelvis: Some amorphous high attenuation material is noted in the dependent portion of the gallbladder, favored to predominantly reflect some biliary sludge, although some small partially calcified gallstones are also possible. No current findings to suggest an acute cholecystitis at this time. The unenhanced appearance of the liver, pancreas, spleen, bilateral adrenal glands and the right kidney is unremarkable. The left kidney is mildly atrophic and there are some faint amorphous medullary calcifications within the left kidney.  Small volume of ascites. No pneumoperitoneum. No pathologic distention of small bowel. The uterus and ovaries are unremarkable in appearance. No lymphadenopathy identified within the abdomen or pelvis on today's non contrast CT examination. Urinary bladder is normal in appearance.  Musculoskeletal: There are no aggressive appearing lytic or blastic lesions noted in the visualized  portions of the skeleton.  IMPRESSION: 1. There appears to be biliary sludge and potentially tiny gallstones in the gallbladder. No  current findings to suggest acute cholecystitis at this time. 2. Trace amount of intermediate attenuation pleural fluid and/or pleural thickening in the posterior aspect of the lower right hemithorax. This is of uncertain etiology and significance. 3. Small volume of ascites. 4. Additional incidental findings, as above.   Electronically Signed   By: Trudie Reed M.D.   On: 07/31/2013 15:57   Dg Chest 2 View  07/31/2013   CLINICAL DATA:  ANKLE PAIN ABDOMINAL PAIN  EXAM: CHEST - 2 VIEW  COMPARISON:  03/06/2013  FINDINGS: Linear scarring or subsegmental atelectasis in the right mid lung as before. Left lung clear. Heart size normal. No effusion. Regional bones unremarkable.  IMPRESSION: 1. No acute disease   Electronically Signed   By: Oley Balm M.D.   On: 07/31/2013 14:17   Dg Tibia/fibula Right  07/31/2013   CLINICAL DATA:  ANKLE PAIN ABDOMINAL PAIN  EXAM: RIGHT TIBIA AND FIBULA - 2 VIEW  COMPARISON:  07/12/2011  FINDINGS: Stable postop changes in the distal fibula. No acute fracture or dislocation. Normal alignment and mineralization. No significant osseous degenerative change.  IMPRESSION: No acute disease   Electronically Signed   By: Oley Balm M.D.   On: 07/31/2013 12:26   Dg Ankle Complete Right  07/31/2013   CLINICAL DATA:  ANKLE PAIN ABDOMINAL PAIN  EXAM: RIGHT ANKLE - COMPLETE 3+ VIEW  COMPARISON:  07/12/2011  FINDINGS: Interval Removal of cast material. Stable plate and screw fixation of distal fibula. Small cortical avulsion injury from the medial malleolus, of uncertain chronicity. Ankle mortise intact. Normal mineralization and alignment. Calcaneal spurs at the plantar aponeurosis and Achilles tendon insertion.  IMPRESSION: 1. Medial malleolus cortical avulsion injury, age indeterminate. Correlate with point tenderness 2. Stable postop changes distal  fibula. 3. Calcaneal spurs.   Electronically Signed   By: Oley Balm M.D.   On: 07/31/2013 12:25   US Abdomen Limited Ruq  07/31/2013   CLINICAL DATA:  Right upper quadrant abdominal pain.  EXAM: US ABDOMEN LIMITED - RIGHT UPPER QUADRANT  COMPARISON:  CT of the abdomen and pelvis 06/04/2013.  FINDINGS: Gallbladder:  Multiple echogenic foci with posterior acoustic shadowing throughout the gallbladder, compatible with calcified gallstones. Gallbladder is moderately distended. Gallbladder wall thickness is normal at 2.8 mm. No abnormal pericholecystic fluid. Per report from the sonographer, the patient did not exhibit a sonographic Murphy's sign on examination.  Common bile duct:  Diameter: 5.5 mm.  Liver:  No focal lesion identified. Within normal limits in parenchymal echogenicity.  IMPRESSION: 1. Cholelithiasis without evidence to suggest acute cholecystitis.   Electronically Signed   By: Trudie Reed M.D.   On: 07/31/2013 14:07       Assessment/Plan 1. Cholelithiasis 2. Confusion likely secondary to malignant brain tumor status post multiple surgeries 3. Recurrent UTIs 4. Nephrolithiasis  Plan: 1. The patient recently had a HIDA scan. There is likely no current needs to repeat this. She does have gallstones. Since her LFTs are beginning to trend down it is likely she may have passed the stone. I will discuss her case with Dr. Lindie Spruce and make further recommendations after discussion with him.  Marrie Chandra E 08/01/2013, 8:56 AM Pager: [phone]

## 2013-08-01 NOTE — Consult Note (Signed)
I believe that he patient is having symptoms from her gallstones.  Will plan on Lap Chole tomorrow.  [name]. [name], MD, [place] [phone] [facility]

## 2013-08-01 NOTE — Progress Notes (Signed)
Late entry form 2200. Patient stated she was not allergic to the generic form of keppra, however it did not work as well. I called pharmacy regarding pt's request for brand name keppra since she did not bring her bottle from home. Pharmacist stated there brand name keppra is not available. I instructed patient and he r family to bring in from home

## 2013-08-02 ENCOUNTER — Inpatient Hospital Stay (HOSPITAL_COMMUNITY): Payer: 59 | Admitting: Certified Registered Nurse Anesthetist

## 2013-08-02 ENCOUNTER — Encounter (HOSPITAL_COMMUNITY): Payer: 59 | Admitting: Certified Registered Nurse Anesthetist

## 2013-08-02 ENCOUNTER — Encounter (HOSPITAL_COMMUNITY)
Admission: EM | Disposition: A | Discharge status: Skilled Nursing Facility | Payer: Self-pay | Source: Home / Self Care | Attending: Internal Medicine

## 2013-08-02 ENCOUNTER — Encounter (HOSPITAL_COMMUNITY): Payer: Self-pay | Admitting: Anesthesiology

## 2013-08-02 DIAGNOSIS — S8253XA Displaced fracture of medial malleolus of unspecified tibia, initial encounter for closed fracture: Secondary | ICD-10-CM

## 2013-08-02 DIAGNOSIS — E669 Obesity, unspecified: Secondary | ICD-10-CM

## 2013-08-02 DIAGNOSIS — K8010 Calculus of gallbladder with chronic cholecystitis without obstruction: Secondary | ICD-10-CM

## 2013-08-02 HISTORY — PX: CHOLECYSTECTOMY: SHX55

## 2013-08-02 LAB — COMPREHENSIVE METABOLIC PANEL
ALBUMIN: 2.8 g/dL — AB (ref 3.5–5.2)
ALK PHOS: 122 U/L — AB (ref 39–117)
ALT: 345 U/L — ABNORMAL HIGH (ref 0–35)
AST: 530 U/L — ABNORMAL HIGH (ref 0–37)
Anion gap: 15 (ref 5–15)
BUN: 9 mg/dL (ref 6–23)
CALCIUM: 8.7 mg/dL (ref 8.4–10.5)
CO2: 21 mEq/L (ref 19–32)
Chloride: 107 mEq/L (ref 96–112)
Creatinine, Ser: 0.61 mg/dL (ref 0.50–1.10)
GFR calc non Af Amer: >90 mL/min (ref >90)
GLUCOSE: 80 mg/dL (ref 70–99)
POTASSIUM: 4.4 meq/L (ref 3.7–5.3)
Sodium: 143 mEq/L (ref 137–147)
Total Bilirubin: 1.1 mg/dL (ref 0.3–1.2)
Total Protein: 6.2 g/dL (ref 6.0–8.3)

## 2013-08-02 LAB — URINE CULTURE: Colony Count: >=100000

## 2013-08-02 LAB — SURGICAL PCR SCREEN
MRSA, PCR: NEGATIVE
Staphylococcus aureus: NEGATIVE

## 2013-08-02 SURGERY — LAPAROSCOPIC CHOLECYSTECTOMY WITH INTRAOPERATIVE CHOLANGIOGRAM
Anesthesia: General

## 2013-08-02 SURGERY — LAPAROSCOPIC CHOLECYSTECTOMY
Anesthesia: General | Site: Abdomen

## 2013-08-02 MED ORDER — ONDANSETRON HCL 4 MG/2ML IJ SOLN
INTRAMUSCULAR | Status: DC | PRN
  Administered 2013-08-02: 4 mg via INTRAVENOUS

## 2013-08-02 MED ORDER — DEXAMETHASONE SODIUM PHOSPHATE 4 MG/ML IJ SOLN
INTRAMUSCULAR | Status: AC
  Filled 2013-08-02: qty 1

## 2013-08-02 MED ORDER — HYDROMORPHONE HCL PF 1 MG/ML IJ SOLN
0.5000 mg | Freq: Once | INTRAMUSCULAR | Status: AC
  Administered 2013-08-02: 0.5 mg via INTRAVENOUS
  Filled 2013-08-02: qty 1

## 2013-08-02 MED ORDER — SODIUM CHLORIDE 0.9 % IR SOLN
Status: DC | PRN
  Administered 2013-08-02 (×4): 1000 mL

## 2013-08-02 MED ORDER — MIDAZOLAM HCL 2 MG/2ML IJ SOLN
INTRAMUSCULAR | Status: AC
  Filled 2013-08-02: qty 2

## 2013-08-02 MED ORDER — PROPOFOL 10 MG/ML IV BOLUS
INTRAVENOUS | Status: DC | PRN
  Administered 2013-08-02: 130 mg via INTRAVENOUS

## 2013-08-02 MED ORDER — OXYCODONE-ACETAMINOPHEN 5-325 MG PO TABS: 1.0000 | ORAL_TABLET | ORAL | Status: DC | PRN

## 2013-08-02 MED ORDER — GLYCOPYRROLATE 0.2 MG/ML IJ SOLN
INTRAMUSCULAR | Status: DC | PRN
  Administered 2013-08-02: .8 mg via INTRAVENOUS

## 2013-08-02 MED ORDER — HYDROMORPHONE HCL PF 1 MG/ML IJ SOLN
INTRAMUSCULAR | Status: AC
  Administered 2013-08-02: 0.25 mg via INTRAVENOUS
  Filled 2013-08-02: qty 1

## 2013-08-02 MED ORDER — SUCCINYLCHOLINE CHLORIDE 20 MG/ML IJ SOLN
INTRAMUSCULAR | Status: AC
Start: 2013-08-02 — End: 2013-08-02
  Filled 2013-08-02: qty 1

## 2013-08-02 MED ORDER — PROPOFOL 10 MG/ML IV BOLUS
INTRAVENOUS | Status: AC
  Filled 2013-08-02: qty 20

## 2013-08-02 MED ORDER — DEXAMETHASONE SODIUM PHOSPHATE 4 MG/ML IJ SOLN
INTRAMUSCULAR | Status: DC | PRN
Start: 2013-08-02 — End: 2013-08-02
  Administered 2013-08-02: 4 mg via INTRAVENOUS

## 2013-08-02 MED ORDER — 0.9 % SODIUM CHLORIDE (POUR BTL) OPTIME
TOPICAL | Status: DC | PRN
Start: 2013-08-02 — End: 2013-08-02
  Administered 2013-08-02: 1000 mL

## 2013-08-02 MED ORDER — FENTANYL CITRATE 0.05 MG/ML IJ SOLN
INTRAMUSCULAR | Status: DC | PRN
Start: 2013-08-02 — End: 2013-08-02
  Administered 2013-08-02 (×5): 50 ug via INTRAVENOUS

## 2013-08-02 MED ORDER — LACTATED RINGERS IV SOLN
INTRAVENOUS | Status: DC | PRN
  Administered 2013-08-02 (×2): via INTRAVENOUS

## 2013-08-02 MED ORDER — LIDOCAINE HCL (CARDIAC) 20 MG/ML IV SOLN
INTRAVENOUS | Status: DC | PRN
  Administered 2013-08-02: 80 mg via INTRAVENOUS

## 2013-08-02 MED ORDER — BUPIVACAINE-EPINEPHRINE (PF) 0.25% -1:200000 IJ SOLN
INTRAMUSCULAR | Status: AC
  Filled 2013-08-02: qty 30

## 2013-08-02 MED ORDER — NEOSTIGMINE METHYLSULFATE 10 MG/10ML IV SOLN
INTRAVENOUS | Status: DC | PRN
  Administered 2013-08-02: 5 mg via INTRAVENOUS

## 2013-08-02 MED ORDER — ONDANSETRON HCL 4 MG/2ML IJ SOLN
INTRAMUSCULAR | Status: AC
  Filled 2013-08-02: qty 2

## 2013-08-02 MED ORDER — SODIUM CHLORIDE 0.9 % IV SOLN
INTRAVENOUS | Status: DC | PRN
  Administered 2013-08-02: 17:00:00

## 2013-08-02 MED ORDER — ONDANSETRON HCL 4 MG/2ML IJ SOLN: 4.0000 mg | Freq: Once | INTRAMUSCULAR | Status: DC | PRN

## 2013-08-02 MED ORDER — ROCURONIUM BROMIDE 100 MG/10ML IV SOLN
INTRAVENOUS | Status: DC | PRN
  Administered 2013-08-02: 40 mg via INTRAVENOUS

## 2013-08-02 MED ORDER — FENTANYL CITRATE 0.05 MG/ML IJ SOLN
INTRAMUSCULAR | Status: AC
  Filled 2013-08-02: qty 5

## 2013-08-02 MED ORDER — NEOSTIGMINE METHYLSULFATE 10 MG/10ML IV SOLN
INTRAVENOUS | Status: AC
  Filled 2013-08-02: qty 2

## 2013-08-02 MED ORDER — GLYCOPYRROLATE 0.2 MG/ML IJ SOLN
INTRAMUSCULAR | Status: AC
  Filled 2013-08-02: qty 3

## 2013-08-02 MED ORDER — TRAZODONE HCL 50 MG PO TABS
50.0000 mg | ORAL_TABLET | Freq: Once | ORAL | Status: AC
  Administered 2013-08-02: 50 mg via ORAL
  Filled 2013-08-02: qty 1

## 2013-08-02 MED ORDER — HYDROMORPHONE HCL PF 1 MG/ML IJ SOLN
0.2500 mg | INTRAMUSCULAR | Status: DC | PRN
  Administered 2013-08-02 (×4): 0.25 mg via INTRAVENOUS

## 2013-08-02 MED ORDER — HEMOSTATIC AGENTS (NO CHARGE) OPTIME
TOPICAL | Status: DC | PRN
  Administered 2013-08-02 (×3): 1 via TOPICAL

## 2013-08-02 MED ORDER — LACTATED RINGERS IV SOLN
INTRAVENOUS | Status: DC
  Administered 2013-08-02 – 2013-08-03 (×2): via INTRAVENOUS

## 2013-08-02 MED ORDER — BUPIVACAINE-EPINEPHRINE 0.25% -1:200000 IJ SOLN
INTRAMUSCULAR | Status: DC | PRN
  Administered 2013-08-02: 20 mL

## 2013-08-02 SURGICAL SUPPLY — 44 items
APPLIER CLIP 5 13 M/L LIGAMAX5 (MISCELLANEOUS) ×4
APPLIER CLIP ROT 10 11.4 M/L (STAPLE)
BLADE SURG ROTATE 9660 (MISCELLANEOUS) IMPLANT
CANISTER SUCTION 2500CC (MISCELLANEOUS) ×8 IMPLANT
CHLORAPREP W/TINT 26ML (MISCELLANEOUS) ×4 IMPLANT
CLIP APPLIE 5 13 M/L LIGAMAX5 (MISCELLANEOUS) ×2 IMPLANT
CLIP APPLIE ROT 10 11.4 M/L (STAPLE) IMPLANT
COVER MAYO STAND STRL (DRAPES) ×4 IMPLANT
COVER SURGICAL LIGHT HANDLE (MISCELLANEOUS) ×4 IMPLANT
DERMABOND ADVANCED (GAUZE/BANDAGES/DRESSINGS) ×2
DERMABOND ADVANCED .7 DNX12 (GAUZE/BANDAGES/DRESSINGS) ×2 IMPLANT
DRAPE C-ARM 42X72 X-RAY (DRAPES) ×4 IMPLANT
DRAPE UTILITY 15X26 W/TAPE STR (DRAPE) ×8 IMPLANT
DRSG TEGADERM 2-3/8X2-3/4 SM (GAUZE/BANDAGES/DRESSINGS) ×20 IMPLANT
ELECT REM PT RETURN 9FT ADLT (ELECTROSURGICAL) ×4
ELECTRODE REM PT RTRN 9FT ADLT (ELECTROSURGICAL) ×2 IMPLANT
GLOVE BIO SURGEON STRL SZ7.5 (GLOVE) ×4 IMPLANT
GLOVE BIOGEL PI IND STRL 7.0 (GLOVE) ×4 IMPLANT
GLOVE BIOGEL PI IND STRL 8 (GLOVE) ×2 IMPLANT
GLOVE BIOGEL PI INDICATOR 7.0 (GLOVE) ×4
GLOVE BIOGEL PI INDICATOR 8 (GLOVE) ×2
GLOVE ECLIPSE 7.5 STRL STRAW (GLOVE) ×4 IMPLANT
GLOVE SS N UNI LF 7.0 STRL (GLOVE) ×4 IMPLANT
GLOVE SURG SS PI 6.5 STRL IVOR (GLOVE) ×4 IMPLANT
GOWN STRL REUS W/ TWL LRG LVL3 (GOWN DISPOSABLE) ×6 IMPLANT
GOWN STRL REUS W/TWL LRG LVL3 (GOWN DISPOSABLE) ×6
HEMOSTAT SNOW SURGICEL 2X4 (HEMOSTASIS) ×12 IMPLANT
KIT BASIN OR (CUSTOM PROCEDURE TRAY) ×4 IMPLANT
KIT ROOM TURNOVER OR (KITS) ×4 IMPLANT
NS IRRIG 1000ML POUR BTL (IV SOLUTION) ×4 IMPLANT
PAD ARMBOARD 7.5X6 YLW CONV (MISCELLANEOUS) ×4 IMPLANT
POUCH SPECIMEN RETRIEVAL 10MM (ENDOMECHANICALS) IMPLANT
SCISSORS LAP 5X35 DISP (ENDOMECHANICALS) ×4 IMPLANT
SET CHOLANGIOGRAPH 5 50 .035 (SET/KITS/TRAYS/PACK) ×4 IMPLANT
SET IRRIG TUBING LAPAROSCOPIC (IRRIGATION / IRRIGATOR) ×4 IMPLANT
SLEEVE ENDOPATH XCEL 5M (ENDOMECHANICALS) ×4 IMPLANT
SPECIMEN JAR SMALL (MISCELLANEOUS) ×4 IMPLANT
SUT MNCRL AB 4-0 PS2 18 (SUTURE) ×4 IMPLANT
TOWEL OR 17X24 6PK STRL BLUE (TOWEL DISPOSABLE) ×4 IMPLANT
TOWEL OR 17X26 10 PK STRL BLUE (TOWEL DISPOSABLE) ×4 IMPLANT
TRAY LAPAROSCOPIC (CUSTOM PROCEDURE TRAY) ×4 IMPLANT
TROCAR XCEL BLUNT TIP 100MML (ENDOMECHANICALS) ×4 IMPLANT
TROCAR XCEL NON-BLD 11X100MML (ENDOMECHANICALS) IMPLANT
TROCAR XCEL NON-BLD 5MMX100MML (ENDOMECHANICALS) ×4 IMPLANT

## 2013-08-02 NOTE — Anesthesia Preprocedure Evaluation (Signed)
Anesthesia Evaluation  Patient identified by MRN, date of birth, ID band Patient awake    Reviewed: Allergy & Precautions, H&P , NPO status , Patient's Chart, lab work & pertinent test results  Airway       Dental   Pulmonary          Cardiovascular     Neuro/Psych  Headaches, Seizures -,  Depression  Neuromuscular disease    GI/Hepatic (+) Hepatitis -  Endo/Other  Morbid obesity  Renal/GU Renal disease     Musculoskeletal  (+) Fibromyalgia -  Abdominal   Peds  Hematology  (+) anemia ,   Anesthesia Other Findings   Reproductive/Obstetrics                           Anesthesia Physical Anesthesia Plan  ASA: III  Anesthesia Plan: General   Post-op Pain Management:    Induction: Intravenous  Airway Management Planned: Oral ETT  Additional Equipment:   Intra-op Plan:   Post-operative Plan: Extubation in OR  Informed Consent: I have reviewed the patients History and Physical, chart, labs and discussed the procedure including the risks, benefits and alternatives for the proposed anesthesia with the patient or authorized representative who has indicated his/her understanding and acceptance.     Plan Discussed with: CRNA, Anesthesiologist and Surgeon  Anesthesia Plan Comments:         Anesthesia Quick Evaluation

## 2013-08-02 NOTE — Anesthesia Postprocedure Evaluation (Signed)
  Anesthesia Post-op Note  Patient: [name]  Procedure(s) Performed: Procedure(s): LAPAROSCOPIC CHOLECYSTECTOMY  Patient Location: PACU  Anesthesia Type:General  Level of Consciousness: awake, alert  and oriented  Airway and Oxygen Therapy: Patient Spontanous Breathing and Patient connected to nasal cannula oxygen  Post-op Pain: mild  Post-op Assessment: Post-op Vital signs reviewed, Patient's Cardiovascular Status Stable, Respiratory Function Stable and Pain level controlled  Post-op Vital Signs: stable  Last Vitals:  Filed Vitals:   08/02/13 2123  BP: 109/78  Pulse: 102  Temp: 36.6 C  Resp: 16    Complications: No apparent anesthesia complications

## 2013-08-02 NOTE — Op Note (Addendum)
OPERATIVE REPORT  DATE OF OPERATION: 08/02/2013  PATIENT:  [name]  58 y.o. female  PRE-OPERATIVE DIAGNOSIS:  Gallstones  POST-OPERATIVE DIAGNOSIS:  Gallstones, acute cholecystitis and hydrops of the gallbladder  PROCEDURE:  Procedure(s): LAPAROSCOPIC CHOLECYSTECTOMY  SURGEON:  Surgeon(s): Gwenyth Ober, MD  ASSISTANT: None  ANESTHESIA:   general  EBL: 100 ml  BLOOD ADMINISTERED: none  DRAINS: none   SPECIMEN:  Source of Specimen:  Gallbladder and stones  COUNTS CORRECT:  YES  PROCEDURE DETAILS: The patient was taken to the operating room and placed on the table in the supine position.  After an adequate endotracheal anesthetic was administered, the patient was prepped with ChloroPrep, and then draped in the usual manner exposing the entire abdomen laterally, inferiorly and up  to the costal margins.  After a proper timeout was performed including identifying the patient and the procedure to be performed, a uspraumbilical 9.3YB midline incision was made using a #15 blade.  This was taken down to the fascia which was then incised with a #15 blade.  The edges of the fascia were tented up with Kocher clamps as the preperitoneal space was penetrated with a Kelly clamp into the peritoneum.  Once this was done, a pursestring suture of 0 Vicryl was passed around the fascial opening.  This was subsequently used to secure the [facility] cannula which was passed into the peritoneal cavity.  Once the [facility] cannula was in place, carbon dioxide gas was insufflated into the peritoneal cavity up to a maximal intra-abdominal pressure of 37mm Hg.The laparoscope, with attached camera and light source, was passed into the peritoneal cavity to visualize the direct insertion of two right upper quadrant 68mm cannulas, and a sup-xiphoid 91mm cannula.  This was subsequently changed out to an 11 mm cannula and during the case.  Once all cannulas were in place, the dissection was begun.  Two ratcheted  graspers were attached to the dome and infundibulum of the gallbladder and retracted towards the anterior abdominal wall and the right upper quadrant.  Using cautery attached to a dissecting forceps, the peritoneum overlaying the triangle of Chalot and the hepatoduodenal triangle was dissected away exposing the cystic duct and the cystic artery.  The cystic artery was clipped proximally and distally then transected.  A clip was placed on the gallbladder side of the cystic duct, then the distal cystic duct was clipped multiple times then transected.  The gallbladder was then dissected out of the hepatic bed without event.  It was retrieved from the abdomen (using an EndoCatch bag) without event.  During the dissection of the gallbladder from the hepatic bed because of the intense inflammation the rostral from the liver, leaving cross-referenced which bled more than usual. Electrocautery and Surgicel snow was used to control the bleeding. At the termination of the case the bleeding was controlled. No drains were left in place. Once the gallbladder was removed, the bed was inspected for hemostasis.  Once excellent hemostasis was obtained all gas and fluids were aspirated from above the liver, then the cannulas were removed.  The supraumbilical incision was closed using the pursestring suture which was in place.  0.25% bupivicaine with epinephrine was injected at all sites.  All 54mm or greater cannula sites were close using a running subcuticular stitch of 4-0 Monocryl.  5.83mm cannula sites were closed with Dermabond only.Steri-Strips and Tagaderm were used to complete the dressings at all sites.  At this point all needle, sponge, and instrument counts were correct.The patient  was awakened from anesthesia and taken to the PACU in stable condition.  PATIENT DISPOSITION:  PACU - guarded condition.   Gwenyth Ober 7/28/20155:43 PM

## 2013-08-02 NOTE — Transfer of Care (Signed)
Immediate Anesthesia Transfer of Care Note  Patient: [name]  Procedure(s) Performed: Procedure(s): LAPAROSCOPIC CHOLECYSTECTOMY  Patient Location: PACU  Anesthesia Type:General  Level of Consciousness: awake and alert   Airway & Oxygen Therapy: Patient Spontanous Breathing and Patient connected to nasal cannula oxygen  Post-op Assessment: Report given to PACU RN and Post -op Vital signs reviewed and stable  Post vital signs: Reviewed and stable  Complications: No apparent anesthesia complications

## 2013-08-02 NOTE — Progress Notes (Signed)
For surgery today.  [name]. [name], MD, [place] [phone] [facility] Surgery

## 2013-08-02 NOTE — Progress Notes (Signed)
[facility] PROGRESS NOTE  [name] ZOX:[id number] DOB: February 22, 1955 DOA: 07/31/2013 PCP: Neena Rhymes, MD  Assessment/Plan: 1. Pyelonephritis  1. Urine culture with >100,000 gmr 2. Pt is not septic 3. On empiric rocephin for now 2. Hepatitis  1. AST:ALT 2:1 2. ETOH levels pending, Tylenol level neg 3. Acute hepatitis panel neg 4. Elevated LFT's likely related to biliary sludge per below 5. Avoid hepatotoxic agents such as tylenol containing meds 6. Will hold home statin secondary to elevated LFT's 3. Biliary sludge  1. Alk phos not elevated 2. General Surgery following 3. Plans for cholecystectomy today 4. Hx brain tumor  1. Appears stable 5. Fibromyalgia  1. Appears stable 2. Cont Lyrica (or equivalent) per home regimen 6. DVT prophylaxis  1. Heparin subQ 7. R ankle fracture  1. Pt to follow up w/ Ortho as outpt 2. S/p mechanical fall day before admission 8. Hx seizures  1. Stable 2. Cont keppra per home regimen  Code Status: Full Family Communication: Pt in room Disposition Plan: Pending   Consultants:  Surgery  Procedures:    Antibiotics:  Rocephin 7/26>>>  HPI/Subjective: No acute events noted overnight.  Objective: Filed Vitals:   08/01/13 0500 08/01/13 1347 08/01/13 2014 08/02/13 0505  BP: 103/71 98/67 112/76 131/85  Pulse: 80 72 75 69  Temp: 98.7 F (37.1 C) 97.9 F (36.6 C) 98.4 F (36.9 C) 97.5 F (36.4 C)  TempSrc: Oral Oral Oral Oral  Resp: 20 18 18 18   Height:      Weight:      SpO2: 94% 93% 94% 95%    Intake/Output Summary (Last 24 hours) at 08/02/13 8119 Last data filed at 08/01/13 1400  Gross per 24 hour  Intake     50 ml  Output      0 ml  Net     50 ml   Filed Weights   07/31/13 1800  Weight: 88.406 kg (194 lb 14.4 oz)    Exam:   General:  Awake, in nad  Cardiovascular: regular, s1, s2  Respiratory: normal resp effort, no wheezing  Abdomen: soft, RUQ pain  Musculoskeletal: perfused, no clubbing    Data Reviewed: Basic Metabolic Panel:  Recent Labs Lab 07/31/13 1000 07/31/13 1443 08/01/13 0620 08/02/13 0554  NA 137  --  144 143  K 5.8* 4.2 3.8 4.4  CL 100  --  109 107  CO2 20  --  22 21  GLUCOSE 80  --  96 80  BUN 13  --  12 9  CREATININE 0.80  --  0.88 0.61  CALCIUM 9.8  --  8.6 8.7   Liver Function Tests:  Recent Labs Lab 07/31/13 1000 07/31/13 1443 08/01/13 0620 08/02/13 0554  AST 730* 548* 680* 530*  ALT 325* 291* 322* 345*  ALKPHOS 91 89 106 122*  BILITOT 2.0* 1.2 1.0 1.1  PROT 7.7 7.0 6.0 6.2  ALBUMIN 3.4* 3.3* 2.8* 2.8*    Recent Labs Lab 07/31/13 1000  LIPASE 18    Recent Labs Lab 08/01/13 0915  AMMONIA 47   CBC:  Recent Labs Lab 07/31/13 1000 08/01/13 0620  WBC 4.7 3.6*  NEUTROABS 3.0  --   HGB 13.2 10.2*  HCT 39.9 31.3*  MCV 87.9 90.2  PLT 184 182   Cardiac Enzymes: No results found for this basename: CKTOTAL, CKMB, CKMBINDEX, TROPONINI,  in the last 168 hours BNP (last 3 results) No results found for this basename: PROBNP,  in the last 8760 hours CBG:  No results found for this basename: GLUCAP,  in the last 168 hours  Recent Results (from the past 240 hour(s))  URINE CULTURE     Status: None   Collection Time    07/31/13 10:50 AM      Result Value Ref Range Status   Specimen Description URINE, CATHETERIZED   Final   Special Requests NONE   Final   Culture  Setup Time     Final   Value: 07/31/2013 23:03     Performed at [facility] Count     Final   Value: >=100,000 COLONIES/ML     Performed at [facility]   Culture     Final   Value: GRAM NEGATIVE RODS     Performed at [facility]   Report Status PENDING   Incomplete  SURGICAL PCR SCREEN     Status: None   Collection Time    08/01/13 10:38 PM      Result Value Ref Range Status   MRSA, PCR NEGATIVE  NEGATIVE Final   Staphylococcus aureus NEGATIVE  NEGATIVE Final   Comment:            The Xpert SA Assay (FDA     approved  for NASAL specimens     in patients over 60 years of age),     is one component of     a comprehensive surveillance     program.  Test performance has     been validated by [employer] for patients greater     than or equal to 70 year old.     It is not intended     to diagnose infection nor to     guide or monitor treatment.     Studies: Ct Abdomen Pelvis Wo Contrast  07/31/2013   CLINICAL DATA:  Abdominal pain for the past 2 weeks more severe today.  EXAM: CT ABDOMEN AND PELVIS WITHOUT CONTRAST  TECHNIQUE: Multidetector CT imaging of the abdomen and pelvis was performed following the standard protocol without IV contrast.  COMPARISON:  CT of the abdomen and pelvis 06/04/2013.  FINDINGS: Lung Bases: There is either a trace amount of intermediate attenuation fluid or there is pleural thickening in the posterior aspect of the lower right hemithorax. Small amount of subsegmental atelectasis in the lower lobes of the lungs bilaterally.  Abdomen/Pelvis: Some amorphous high attenuation material is noted in the dependent portion of the gallbladder, favored to predominantly reflect some biliary sludge, although some small partially calcified gallstones are also possible. No current findings to suggest an acute cholecystitis at this time. The unenhanced appearance of the liver, pancreas, spleen, bilateral adrenal glands and the right kidney is unremarkable. The left kidney is mildly atrophic and there are some faint amorphous medullary calcifications within the left kidney.  Small volume of ascites. No pneumoperitoneum. No pathologic distention of small bowel. The uterus and ovaries are unremarkable in appearance. No lymphadenopathy identified within the abdomen or pelvis on today's non contrast CT examination. Urinary bladder is normal in appearance.  Musculoskeletal: There are no aggressive appearing lytic or blastic lesions noted in the visualized portions of the skeleton.  IMPRESSION: 1. There appears  to be biliary sludge and potentially tiny gallstones in the gallbladder. No current findings to suggest acute cholecystitis at this time. 2. Trace amount of intermediate attenuation pleural fluid and/or pleural thickening in the posterior aspect of the lower right hemithorax. This is of  uncertain etiology and significance. 3. Small volume of ascites. 4. Additional incidental findings, as above.   Electronically Signed   By: Trudie Reed M.D.   On: 07/31/2013 15:57   Dg Chest 2 View  07/31/2013   CLINICAL DATA:  ANKLE PAIN ABDOMINAL PAIN  EXAM: CHEST - 2 VIEW  COMPARISON:  03/06/2013  FINDINGS: Linear scarring or subsegmental atelectasis in the right mid lung as before. Left lung clear. Heart size normal. No effusion. Regional bones unremarkable.  IMPRESSION: 1. No acute disease   Electronically Signed   By: Oley Balm M.D.   On: 07/31/2013 14:17   Dg Tibia/fibula Right  07/31/2013   CLINICAL DATA:  ANKLE PAIN ABDOMINAL PAIN  EXAM: RIGHT TIBIA AND FIBULA - 2 VIEW  COMPARISON:  07/12/2011  FINDINGS: Stable postop changes in the distal fibula. No acute fracture or dislocation. Normal alignment and mineralization. No significant osseous degenerative change.  IMPRESSION: No acute disease   Electronically Signed   By: Oley Balm M.D.   On: 07/31/2013 12:26   Dg Ankle Complete Right  07/31/2013   CLINICAL DATA:  ANKLE PAIN ABDOMINAL PAIN  EXAM: RIGHT ANKLE - COMPLETE 3+ VIEW  COMPARISON:  07/12/2011  FINDINGS: Interval Removal of cast material. Stable plate and screw fixation of distal fibula. Small cortical avulsion injury from the medial malleolus, of uncertain chronicity. Ankle mortise intact. Normal mineralization and alignment. Calcaneal spurs at the plantar aponeurosis and Achilles tendon insertion.  IMPRESSION: 1. Medial malleolus cortical avulsion injury, age indeterminate. Correlate with point tenderness 2. Stable postop changes distal fibula. 3. Calcaneal spurs.   Electronically Signed    By: Oley Balm M.D.   On: 07/31/2013 12:25   US Abdomen Limited Ruq  07/31/2013   CLINICAL DATA:  Right upper quadrant abdominal pain.  EXAM: US ABDOMEN LIMITED - RIGHT UPPER QUADRANT  COMPARISON:  CT of the abdomen and pelvis 06/04/2013.  FINDINGS: Gallbladder:  Multiple echogenic foci with posterior acoustic shadowing throughout the gallbladder, compatible with calcified gallstones. Gallbladder is moderately distended. Gallbladder wall thickness is normal at 2.8 mm. No abnormal pericholecystic fluid. Per report from the sonographer, the patient did not exhibit a sonographic Murphy's sign on examination.  Common bile duct:  Diameter: 5.5 mm.  Liver:  No focal lesion identified. Within normal limits in parenchymal echogenicity.  IMPRESSION: 1. Cholelithiasis without evidence to suggest acute cholecystitis.   Electronically Signed   By: Trudie Reed M.D.   On: 07/31/2013 14:07    Scheduled Meds: . cefTRIAXone (ROCEPHIN)  IV  1 g Intravenous Q24H  . DULoxetine  60 mg Oral Daily  . fenofibrate  160 mg Oral Daily  . heparin  5,000 Units Subcutaneous 3 times per day  . levETIRAcetam  1,500 mg Oral BID  . Linaclotide  145 mcg Oral Daily  . meloxicam  15 mg Oral Q breakfast  . pantoprazole  40 mg Oral Daily  . 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 If 7PM-7AM, please contact night-coverage at [email or web address], password [facility] 08/02/2013, 8:21 AM  LOS: 2 days

## 2013-08-03 ENCOUNTER — Encounter (HOSPITAL_COMMUNITY): Payer: Self-pay | Admitting: General Surgery

## 2013-08-03 DIAGNOSIS — K7200 Acute and subacute hepatic failure without coma: Secondary | ICD-10-CM

## 2013-08-03 DIAGNOSIS — K810 Acute cholecystitis: Secondary | ICD-10-CM

## 2013-08-03 LAB — CBC WITH DIFFERENTIAL/PLATELET
BASOS PCT: 0 % (ref 0–1)
Basophils Absolute: 0 10*3/uL (ref 0.0–0.1)
EOS ABS: 0 10*3/uL (ref 0.0–0.7)
Eosinophils Relative: 0 % (ref 0–5)
HEMATOCRIT: 27.9 % — AB (ref 36.0–46.0)
HEMOGLOBIN: 9.2 g/dL — AB (ref 12.0–15.0)
Lymphocytes Relative: 7 % — ABNORMAL LOW (ref 12–46)
Lymphs Abs: 0.5 10*3/uL — ABNORMAL LOW (ref 0.7–4.0)
MCH: 29.8 pg (ref 26.0–34.0)
MCHC: 33 g/dL (ref 30.0–36.0)
MCV: 90.3 fL (ref 78.0–100.0)
MONO ABS: 0.5 10*3/uL (ref 0.1–1.0)
Monocytes Relative: 8 % (ref 3–12)
Neutro Abs: 6 10*3/uL (ref 1.7–7.7)
Neutrophils Relative %: 85 % — ABNORMAL HIGH (ref 43–77)
Platelets: 211 10*3/uL (ref 150–400)
RBC: 3.09 MIL/uL — ABNORMAL LOW (ref 3.87–5.11)
RDW: 15.5 % (ref 11.5–15.5)
WBC: 7 10*3/uL (ref 4.0–10.5)

## 2013-08-03 MED ORDER — HYDROCODONE-ACETAMINOPHEN 5-325 MG PO TABS
1.0000 | ORAL_TABLET | Freq: Four times a day (QID) | ORAL | Status: DC | PRN
Start: 2013-08-03 — End: 2013-08-04
  Administered 2013-08-03 – 2013-08-04 (×4): 1 via ORAL
  Filled 2013-08-03 (×5): qty 1

## 2013-08-03 MED ORDER — CEPHALEXIN 500 MG PO CAPS
500.0000 mg | ORAL_CAPSULE | Freq: Three times a day (TID) | ORAL | Status: DC
  Administered 2013-08-03 – 2013-08-04 (×4): 500 mg via ORAL
  Filled 2013-08-03 (×6): qty 1

## 2013-08-03 NOTE — Progress Notes (Signed)
[facility] Progress Note 1 Day Post-Op  Subjective: Patient is doing okay.  Some abdominal pain.  Objective: Vital signs in last 24 hours: Temp:  [97.8 F (36.6 C)-98.6 F (37 C)] 98.6 F (37 C) (07/29 0448) Pulse Rate:  [81-118] 118 (07/29 0448) Resp:  [6-18] 18 (07/29 0448) BP: (87-135)/(65-97) 124/83 mmHg (07/29 0448) SpO2:  [91 %-98 %] 94 % (07/29 0448) Last BM Date: 08/01/13  Intake/Output from previous day: 07/28 0701 - 07/29 0700 In: 1150 [I.V.:1100; IV Piggyback:50] Out: 100 [Blood:100] Intake/Output this shift:    General: No acute distress.   Lungs: Clear  Abd: Good bowel sounds.  No peritonitis.  Extremities: No changes  Neuro: Slow to respond.  Still seems to be a bit confused.  Lab Results:  @LABLAST2 (wbc:2,hgb:2,hct:2,plt:2) BMET  Recent Labs  08/01/13 0620 08/02/13 0554  NA 144 143  K 3.8 4.4  CL 109 107  CO2 22 21  GLUCOSE 96 80  BUN 12 9  CREATININE 0.88 0.61  CALCIUM 8.6 8.7   PT/INR  Recent Labs  08/01/13 0620  LABPROT 15.5*  INR 1.23   ABG No results found for this basename: PHART, PCO2, PO2, HCO3,  in the last 72 hours  Studies/Results: No results found.  Anti-infectives: Anti-infectives   Start     Dose/Rate Route Frequency Ordered Stop   08/01/13 1330  cefTRIAXone (ROCEPHIN) 1 g in dextrose 5 % 50 mL IVPB     1 g 100 mL/hr over 30 Minutes Intravenous Every 24 hours 07/31/13 1732     07/31/13 1330  cefTRIAXone (ROCEPHIN) 1 g in dextrose 5 % 50 mL IVPB     1 g 100 mL/hr over 30 Minutes Intravenous  Once 07/31/13 1327 07/31/13 1520      Assessment/Plan: s/p Procedure(s): LAPAROSCOPIC CHOLECYSTECTOMY Will advance diet. Can go home later today.  LOS: 3 days   [name]. [name], MD, FACS [phone] [facility] Surgery 08/03/2013

## 2013-08-03 NOTE — Progress Notes (Signed)
PATIENT DETAILS Name: [name] Age: 58 y.o. Sex: female Date of Birth: 06-Mar-1955 Admit Date: 07/31/2013 Admitting Physician Donne Hazel, MD [id number] Birdie Riddle, MD  Subjective: No major complaints overnight  Assessment/Plan: Principal Problem:   Pyelonephritis - Admitted and started on Rocephin, urine culture positive for Klebsiella oxytoca- pan sensitive. Stop Rocephin, switch to Keflex, plan for a total of at least 7-10 days of therapy. Afebrile, without leukocytosis.  Active Problems: Acute calculus cholecystitis - Seen by general surgery, underwent laparoscopic cholecystectomy on 7/28 - No major events postop, minimally confused, advance diet.  Elevated liver enzymes - Likely secondary to cholecystitis. - Downtrending, will need further followup of LFTs as outpatient to make sure they normalize.  Fibromyalgia - Stable, continue with lyrica  History of seizure disorder - Continue with Keppra  Recent right ankle fracture - Apparently a few days prior to admission following a mechanical fall. ED M.D. spoke with Dr. Marcelino Scot over the phone, who advised fracture boot and outpatient followup.    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  Disposition: Remain inpatient  DVT Prophylaxis: SCD's  Code Status: Full code  Family Communication Grand-daughter at bedside  Procedures:  None  CONSULTS:  general surgery  Time spent 40 minutes-which includes 50% of the time with face-to-face with patient/ family and coordinating care related to the above assessment and plan.    MEDICATIONS: Scheduled Meds: . cephALEXin  500 mg Oral 3 times per day  . DULoxetine  60 mg Oral Daily  . fenofibrate  160 mg Oral Daily  . levETIRAcetam  1,500 mg Oral BID  . Linaclotide  145 mcg Oral Daily  .  meloxicam  15 mg Oral Q breakfast  . pantoprazole  40 mg Oral Daily  . pregabalin  300 mg Oral Daily   Continuous Infusions:  PRN Meds:.HYDROcodone-acetaminophen, ondansetron (ZOFRAN) IV, ondansetron  Antibiotics: Anti-infectives   Start     Dose/Rate Route Frequency Ordered Stop   08/03/13 1400  cephALEXin (KEFLEX) capsule 500 mg     500 mg Oral 3 times per day 08/03/13 1021     08/01/13 1330  cefTRIAXone (ROCEPHIN) 1 g in dextrose 5 % 50 mL IVPB  Status:  Discontinued     1 g 100 mL/hr over 30 Minutes Intravenous Every 24 hours 07/31/13 1732 08/03/13 1021   07/31/13 1330  cefTRIAXone (ROCEPHIN) 1 g in dextrose 5 % 50 mL IVPB     1 g 100 mL/hr over 30 Minutes Intravenous  Once 07/31/13 1327 07/31/13 1520       PHYSICAL EXAM: Vital signs in last 24 hours: Filed Vitals:   08/02/13 1940 08/02/13 1953 08/02/13 2123 08/03/13 0448  BP:  135/82 109/78 124/83  Pulse:  101 102 118  Temp: 97.8 F (36.6 C) 97.9 F (36.6 C) 97.8 F (36.6 C) 98.6 F (37 C)  TempSrc:   Oral Oral  Resp:  16 16 18   Height:      Weight:      SpO2:  93% 93% 94%    Weight change:  Filed Weights   07/31/13 1800  Weight: 88.406 kg (194 lb 14.4 oz)   Body mass index is 35.64 kg/(m^2).   Gen Exam: Awake, slow but mostly.   Neck: Supple, No JVD.   Chest: B/L  Clear.   CVS: S1 S2 Regular, no murmurs.  Abdomen: soft, BS +, appropriately tender, non distended.  Extremities: no edema, lower extremities warm to touch. Neurologic: Non Focal.   Skin: No Rash.   Wounds: N/A.    Intake/Output from previous day:  Intake/Output Summary (Last 24 hours) at 08/03/13 1022 Last data filed at 08/02/13 1808  Gross per 24 hour  Intake   1150 ml  Output    100 ml  Net   1050 ml     LAB RESULTS: CBC  Recent Labs Lab 07/31/13 1000 08/01/13 0620 08/03/13 0620  WBC 4.7 3.6* 7.0  HGB 13.2 10.2* 9.2*  HCT 39.9 31.3* 27.9*  PLT 184 182 211  MCV 87.9 90.2 90.3  MCH 29.1 29.4 29.8  MCHC 33.1 32.6 33.0    RDW 15.8* 15.9* 15.5  LYMPHSABS 1.1  --  0.5*  MONOABS 0.5  --  0.5  EOSABS 0.1  --  0.0  BASOSABS 0.0  --  0.0    Chemistries   Recent Labs Lab 07/31/13 1000 07/31/13 1443 08/01/13 0620 08/02/13 0554  NA 137  --  144 143  K 5.8* 4.2 3.8 4.4  CL 100  --  109 107  CO2 20  --  22 21  GLUCOSE 80  --  96 80  BUN 13  --  12 9  CREATININE 0.80  --  0.88 0.61  CALCIUM 9.8  --  8.6 8.7    CBG: No results found for this basename: GLUCAP,  in the last 168 hours  GFR Estimated Creatinine Clearance: 82 ml/min (by C-G formula based on Cr of 0.61).  Coagulation profile  Recent Labs Lab 08/01/13 0620  INR 1.23    Cardiac Enzymes No results found for this basename: CK, CKMB, TROPONINI, MYOGLOBIN,  in the last 168 hours  No components found with this basename: POCBNP,  No results found for this basename: DDIMER,  in the last 72 hours No results found for this basename: HGBA1C,  in the last 72 hours No results found for this basename: CHOL, HDL, LDLCALC, TRIG, CHOLHDL, LDLDIRECT,  in the last 72 hours No results found for this basename: TSH, T4TOTAL, FREET3, T3FREE, THYROIDAB,  in the last 72 hours No results found for this basename: VITAMINB12, FOLATE, FERRITIN, TIBC, IRON, RETICCTPCT,  in the last 72 hours No results found for this basename: LIPASE, AMYLASE,  in the last 72 hours  Urine Studies No results found for this basename: UACOL, UAPR, USPG, UPH, UTP, UGL, UKET, UBIL, UHGB, UNIT, UROB, ULEU, UEPI, UWBC, URBC, UBAC, CAST, CRYS, UCOM, BILUA,  in the last 72 hours  MICROBIOLOGY: Recent Results (from the past 240 hour(s))  URINE CULTURE     Status: None   Collection Time    07/31/13 10:50 AM      Result Value Ref Range Status   Specimen Description URINE, CATHETERIZED   Final   Special Requests NONE   Final   Culture  Setup Time     Final   Value: 07/31/2013 23:03     Performed at [facility]     Final   Value: >=100,000 COLONIES/ML      Performed at [facility]   Culture     Final   Value: KLEBSIELLA OXYTOCA     Performed at [facility]   Report Status 08/02/2013 FINAL   Final   Organism ID, Bacteria KLEBSIELLA OXYTOCA   Final  SURGICAL  PCR SCREEN     Status: None   Collection Time    08/01/13 10:38 PM      Result Value Ref Range Status   MRSA, PCR NEGATIVE  NEGATIVE Final   Staphylococcus aureus NEGATIVE  NEGATIVE Final   Comment:            The Xpert SA Assay (FDA     approved for NASAL specimens     in patients over 29 years of age),     is one component of     a comprehensive surveillance     program.  Test performance has     been validated by [employer] for patients greater     than or equal to 43 year old.     It is not intended     to diagnose infection nor to     guide or monitor treatment.    RADIOLOGY STUDIES/RESULTS: Ct Abdomen Pelvis Wo Contrast  07/31/2013   CLINICAL DATA:  Abdominal pain for the past 2 weeks more severe today.  EXAM: CT ABDOMEN AND PELVIS WITHOUT CONTRAST  TECHNIQUE: Multidetector CT imaging of the abdomen and pelvis was performed following the standard protocol without IV contrast.  COMPARISON:  CT of the abdomen and pelvis 06/04/2013.  FINDINGS: Lung Bases: There is either a trace amount of intermediate attenuation fluid or there is pleural thickening in the posterior aspect of the lower right hemithorax. Small amount of subsegmental atelectasis in the lower lobes of the lungs bilaterally.  Abdomen/Pelvis: Some amorphous high attenuation material is noted in the dependent portion of the gallbladder, favored to predominantly reflect some biliary sludge, although some small partially calcified gallstones are also possible. No current findings to suggest an acute cholecystitis at this time. The unenhanced appearance of the liver, pancreas, spleen, bilateral adrenal glands and the right kidney is unremarkable. The left kidney is mildly atrophic and there are some  faint amorphous medullary calcifications within the left kidney.  Small volume of ascites. No pneumoperitoneum. No pathologic distention of small bowel. The uterus and ovaries are unremarkable in appearance. No lymphadenopathy identified within the abdomen or pelvis on today's non contrast CT examination. Urinary bladder is normal in appearance.  Musculoskeletal: There are no aggressive appearing lytic or blastic lesions noted in the visualized portions of the skeleton.  IMPRESSION: 1. There appears to be biliary sludge and potentially tiny gallstones in the gallbladder. No current findings to suggest acute cholecystitis at this time. 2. Trace amount of intermediate attenuation pleural fluid and/or pleural thickening in the posterior aspect of the lower right hemithorax. This is of uncertain etiology and significance. 3. Small volume of ascites. 4. Additional incidental findings, as above.   Electronically Signed   By: Vinnie Langton M.D.   On: 07/31/2013 15:57   Dg Chest 2 View  07/31/2013   CLINICAL DATA:  ANKLE PAIN ABDOMINAL PAIN  EXAM: CHEST - 2 VIEW  COMPARISON:  03/06/2013  FINDINGS: Linear scarring or subsegmental atelectasis in the right mid lung as before. Left lung clear. Heart size normal. No effusion. Regional bones unremarkable.  IMPRESSION: 1. No acute disease   Electronically Signed   By: Arne Cleveland M.D.   On: 07/31/2013 14:17   Dg Tibia/fibula Right  07/31/2013   CLINICAL DATA:  ANKLE PAIN ABDOMINAL PAIN  EXAM: RIGHT TIBIA AND FIBULA - 2 VIEW  COMPARISON:  07/12/2011  FINDINGS: Stable postop changes in the distal fibula. No acute fracture or dislocation. Normal  alignment and mineralization. No significant osseous degenerative change.  IMPRESSION: No acute disease   Electronically Signed   By: Arne Cleveland M.D.   On: 07/31/2013 12:26   Dg Ankle Complete Right  07/31/2013   CLINICAL DATA:  ANKLE PAIN ABDOMINAL PAIN  EXAM: RIGHT ANKLE - COMPLETE 3+ VIEW  COMPARISON:  07/12/2011   FINDINGS: Interval Removal of cast material. Stable plate and screw fixation of distal fibula. Small cortical avulsion injury from the medial malleolus, of uncertain chronicity. Ankle mortise intact. Normal mineralization and alignment. Calcaneal spurs at the plantar aponeurosis and Achilles tendon insertion.  IMPRESSION: 1. Medial malleolus cortical avulsion injury, age indeterminate. Correlate with point tenderness 2. Stable postop changes distal fibula. 3. Calcaneal spurs.   Electronically Signed   By: Arne Cleveland M.D.   On: 07/31/2013 12:25   Dg Abd 1 View  07/12/2013   CLINICAL DATA:  Ureteral calculus.  EXAM: ABDOMEN - 1 VIEW  COMPARISON:  Radiograph, 06/10/2013 and CT, 06/04/2013.  FINDINGS: No convincing renal or ureteral stone.  Normal bowel gas pattern. Soft tissues are unremarkable. Mild degenerative changes of the lower lumbar spine.  IMPRESSION: 1. No radiographic evidence of a renal or ureteral stone. No acute findings.   Electronically Signed   By: Lajean Manes M.D.   On: 07/12/2013 15:47   Nm Hepato W/eject Fract  07/12/2013   CLINICAL DATA:  Right upper quadrant pain  EXAM: NUCLEAR MEDICINE HEPATOBILIARY IMAGING WITH GALLBLADDER EF  TECHNIQUE: Sequential images of the abdomen were obtained out to 60 minutes following intravenous administration of radiopharmaceutical. After slow intravenous infusion of 1.8 micrograms Cholecystokinin, gallbladder ejection fraction was determined.  RADIOPHARMACEUTICALS:  5.5 [place] [id number] Choletec  COMPARISON:  None.  FINDINGS: There is immediate homogeneous uptake of radiotracer in the liver. Filling of the gallbladder begins at 30 minutes. Radiotracer uptake is present in the small bowel at 50 minutes.  At the end of 1-hour, there is relatively good clearance of activity from the liver.  When gallbladder filling was complete, the patient was given an IV infusion of CCK. At 30 min the gallbladder ejection fraction measures 85.6%. At 30 min, normal  ejection fraction is greater than 30%.  The patient did not experience symptoms during CCK infusion.  IMPRESSION: 1. Normal hepatobiliary scan. 2. Normal gallbladder ejection fraction.   Electronically Signed   By: Kathreen Devoid   On: 07/12/2013 15:30   US Abdomen Limited Ruq  07/31/2013   CLINICAL DATA:  Right upper quadrant abdominal pain.  EXAM: US ABDOMEN LIMITED - RIGHT UPPER QUADRANT  COMPARISON:  CT of the abdomen and pelvis 06/04/2013.  FINDINGS: Gallbladder:  Multiple echogenic foci with posterior acoustic shadowing throughout the gallbladder, compatible with calcified gallstones. Gallbladder is moderately distended. Gallbladder wall thickness is normal at 2.8 mm. No abnormal pericholecystic fluid. Per report from the sonographer, the patient did not exhibit a sonographic Murphy's sign on examination.  Common bile duct:  Diameter: 5.5 mm.  Liver:  No focal lesion identified. Within normal limits in parenchymal echogenicity.  IMPRESSION: 1. Cholelithiasis without evidence to suggest acute cholecystitis.   Electronically Signed   By: Vinnie Langton M.D.   On: 07/31/2013 14:07    Oren Binet, MD  [facility] Pager:[phone]  If 7PM-7AM, please contact night-coverage [email or web address] Password [email or web address] 08/03/2013, 10:22 AM   LOS: 3 days   **Disclaimer: This note may have been dictated with voice recognition software. Similar sounding words can inadvertently be transcribed and this note may contain transcription errors  which may not have been corrected upon publication of note.**

## 2013-08-03 NOTE — Care Management Note (Signed)
    Page 1 of 1   08/04/2013     4:18:21 PM CARE MANAGEMENT NOTE 08/04/2013  Patient:  PORSCHE, NOGUCHI   Account Number:  [id number]  Date Initiated:  08/03/2013  Documentation initiated by:  Tomi Bamberger  Subjective/Objective Assessment:   dx pyelonephritis  admit- lives with spouse.     Action/Plan:   pt eval-rec snf   Anticipated DC Date:  08/04/2013   Anticipated DC Plan:  SKILLED NURSING FACILITY  In-house referral  Clinical Social Worker      DC Planning Services  CM consult      Choice offered to / List presented to:             Status of service:  Completed, signed off Medicare Important Message given?  NO (If response is "NO", the following Medicare IM given date fields will be blank) Date Medicare IM given:   Medicare IM given by:   Date Additional Medicare IM given:   Additional Medicare IM given by:    Discharge Disposition:  [place]  Per UR Regulation:  Reviewed for med. necessity/level of care/duration of stay  If discussed at [place] of Stay Meetings, dates discussed:    Comments:  08/04/13 [place], BSN [phone] patient most likely will need snf, await pt eval.  CSW aware.  08/03/13 [place], BSN [phone] patient is for possible dc today, await pt eval.

## 2013-08-04 LAB — CBC
HEMATOCRIT: 32.2 % — AB (ref 36.0–46.0)
HEMOGLOBIN: 10.6 g/dL — AB (ref 12.0–15.0)
MCH: 29.5 pg (ref 26.0–34.0)
MCHC: 32.9 g/dL (ref 30.0–36.0)
MCV: 89.7 fL (ref 78.0–100.0)
Platelets: 270 10*3/uL (ref 150–400)
RBC: 3.59 MIL/uL — AB (ref 3.87–5.11)
RDW: 15.4 % (ref 11.5–15.5)
WBC: 9 10*3/uL (ref 4.0–10.5)

## 2013-08-04 LAB — COMPREHENSIVE METABOLIC PANEL
ALBUMIN: 2.5 g/dL — AB (ref 3.5–5.2)
ALT: 225 U/L — ABNORMAL HIGH (ref 0–35)
ANION GAP: 12 (ref 5–15)
AST: 158 U/L — ABNORMAL HIGH (ref 0–37)
Alkaline Phosphatase: 117 U/L (ref 39–117)
BILIRUBIN TOTAL: 0.8 mg/dL (ref 0.3–1.2)
BUN: 9 mg/dL (ref 6–23)
CO2: 24 mEq/L (ref 19–32)
CREATININE: 0.61 mg/dL (ref 0.50–1.10)
Calcium: 8.6 mg/dL (ref 8.4–10.5)
Chloride: 104 mEq/L (ref 96–112)
GFR calc Af Amer: >90 mL/min (ref >90)
GFR calc non Af Amer: >90 mL/min (ref >90)
Glucose, Bld: 80 mg/dL (ref 70–99)
Potassium: 3.7 mEq/L (ref 3.7–5.3)
Sodium: 140 mEq/L (ref 137–147)
TOTAL PROTEIN: 6 g/dL (ref 6.0–8.3)

## 2013-08-04 MED ORDER — CEPHALEXIN 500 MG PO CAPS: 500.0000 mg | ORAL_CAPSULE | Freq: Three times a day (TID) | ORAL | Status: DC

## 2013-08-04 MED ORDER — PANTOPRAZOLE SODIUM 40 MG PO TBEC: 40.0000 mg | DELAYED_RELEASE_TABLET | Freq: Every day | ORAL | Status: DC

## 2013-08-04 MED ORDER — PREGABALIN 150 MG PO CAPS: 150.0000 mg | ORAL_CAPSULE | Freq: Every day | ORAL | Status: DC

## 2013-08-04 MED ORDER — HYDROCODONE-ACETAMINOPHEN 5-325 MG PO TABS: 1.0000 | ORAL_TABLET | Freq: Four times a day (QID) | ORAL | Status: DC | PRN

## 2013-08-04 MED ORDER — PREGABALIN 75 MG PO CAPS
150.0000 mg | ORAL_CAPSULE | Freq: Every day | ORAL | Status: DC
  Administered 2013-08-04: 150 mg via ORAL
  Filled 2013-08-04: qty 2

## 2013-08-04 NOTE — Progress Notes (Addendum)
Patient was discharged to nursing home ([facility]) by MD order; discharged instructions  review and sent to facility with care notes and prescriptions; IV DIC; skin intact; ace wrap on right ankle (old fracture); oxygen on 3L/min via [place]; facility was called and report was given to the nurse; patient will be transported to facility via EMS.

## 2013-08-04 NOTE — Clinical Social Work Placement (Signed)
Clinical Social Work Department CLINICAL SOCIAL WORK PLACEMENT NOTE 08/04/2013  Patient:  [name], [name]  Account Number:  [id number] [place] date:  07/31/2013  Clinical Social Worker:  Lovey Newcomer  Date/time:  08/04/2013 02:56 PM  Clinical Social Work is seeking post-discharge placement for this patient at the following level of care:   SKILLED NURSING   (*CSW will update this form in Epic as items are completed)   08/04/2013  Patient/family provided with [place] Department of Clinical Social Work's list of facilities offering this level of care within the geographic area requested by the patient (or if unable, by the patient's family).  08/04/2013  Patient/family informed of their freedom to choose among providers that offer the needed level of care, that participate in Medicare, Medicaid or managed care program needed by the patient, have an available bed and are willing to accept the patient.  08/04/2013  Patient/family informed of [facility]' ownership interest in [facility] as well as of the fact that they are under no obligation to receive care at this facility.  PASARR submitted to EDS on 08/04/2013 PASARR number received on 08/04/2013  FL2 transmitted to all facilities in geographic area requested by pt/family on  08/04/2013 FL2 transmitted to all facilities within larger geographic area on 08/04/2013  Patient informed that his/her managed care company has contracts with or will negotiate with  certain facilities, including the following:     Patient/family informed of bed offers received:  08/04/2013 Patient chooses bed at [facility] [place] Physician recommends and patient chooses bed at    Patient to be transferred to [facility] on  08/04/2013 Patient to be transferred to facility by Ambulance Patient and family notified of transfer on 08/04/2013 Name of family member notified:   Shanon Brow  The following physician request were entered in Epic:   Additional Comments: Per Md patient ready for DC. RN, patient, patient's husband Shanon Brow, and facility notified of DC. RN given number for report. DC packet on chart. Ambulance transport requested. CSW signing off.    Liz Beach MSW, [place], [place], [phone]

## 2013-08-04 NOTE — Progress Notes (Signed)
[facility]/[name] Progress Note 2 Days Post-Op  Subjective: Patient feeling overall better.  No distress  Objective: Vital signs in last 24 hours: Temp:  [98.3 F (36.8 C)-99.8 F (37.7 C)] 99.8 F (37.7 C) (07/30 0511) Pulse Rate:  [80-87] 87 (07/30 0511) Resp:  [16-18] 16 (07/30 0511) BP: (113-125)/(76-85) 113/76 mmHg (07/30 0511) SpO2:  [92 %-96 %] 92 % (07/30 0511) Last BM Date: 08/01/13  Intake/Output from previous day: 07/29 0701 - 07/30 0700 In: 666 [P.O.:666] Out: -  Intake/Output this shift:    General: No acute distress.   Getting oral pain meds now.  Incisional pain.  Lungs: Clear  Abd: Soft, good bowel sounds.  Not much appetite.  No rebound or guarding.  Extremities: No changes  Neuro: Slow to respond still  Lab Results:  @LABLAST2 (wbc:2,hgb:2,hct:2,plt:2) BMET  Recent Labs  08/02/13 0554 08/04/13 0605  NA 143 140  K 4.4 3.7  CL 107 104  CO2 21 24  GLUCOSE 80 80  BUN 9 9  CREATININE 0.61 0.61  CALCIUM 8.7 8.6   PT/INR No results found for this basename: LABPROT, INR,  in the last 72 hours ABG No results found for this basename: PHART, PCO2, PO2, HCO3,  in the last 72 hours  Studies/Results: No results found.  Anti-infectives: Anti-infectives   Start     Dose/Rate Route Frequency Ordered Stop   08/03/13 1400  cephALEXin (KEFLEX) capsule 500 mg     500 mg Oral 3 times per day 08/03/13 1021     08/01/13 1330  cefTRIAXone (ROCEPHIN) 1 g in dextrose 5 % 50 mL IVPB  Status:  Discontinued     1 g 100 mL/hr over 30 Minutes Intravenous Every 24 hours 07/31/13 1732 08/03/13 1021   07/31/13 1330  cefTRIAXone (ROCEPHIN) 1 g in dextrose 5 % 50 mL IVPB     1 g 100 mL/hr over 30 Minutes Intravenous  Once 07/31/13 1327 07/31/13 1520      Assessment/Plan: s/p Procedure(s): LAPAROSCOPIC CHOLECYSTECTOMY Advance diet Doing well from surgical standpoint Advance diet and able to DC at anytime from our standpoint. Checking CBC  LOS: 4 days   [name].  [name], MD, FACS [phone] [facility] 08/04/2013

## 2013-08-04 NOTE — Evaluation (Signed)
Physical Therapy Evaluation Patient Details Name: [name] MRN: [id number] DOB: [birth year] Today's Date: 08/04/2013   History of Present Illness  Patient is a 58 y/o female admitted with abdominal pain, increased LFTs and pyelonphritis s/p Lap cholecystectomy 7/28. UA suggestive of UTI. 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   Clinical Impression  Patient presents with functional limitations due to deficits listed in PT problem list below. Pt presents with baseline cognitive deficits and generalized weakness limiting bed mobility, transfers and gait. Pt poor historian and difficult to obtain accurate PLOF/history. Pt with delayed processing, initiation and requires constant verbal cues to perform task. Pt with poor sitting balance/trunk control today and unable to stand. Dizziness upon sitting. Pt not safe to be home due to above deficits. Pt would benefit from skilled PT in [facility] SNF prior to discharge home to maximize independence, improve safe mobility and ease burden of care.    Follow Up Recommendations SNF;Supervision/Assistance - 24 hour    Equipment Recommendations  None recommended by PT    Recommendations for Other Services OT consult     Precautions / Restrictions Precautions Precautions: Fall Required Braces or Orthoses: Other Brace/Splint Other Brace/Splint: CAM boot RLE. pt not able to state how long she has had CAM boot - "a long time but I don't wear it." Questionable historian. Restrictions Weight Bearing Restrictions: No      Mobility  Bed Mobility Overal bed mobility: Needs Assistance Bed Mobility: Rolling;Supine to Sit;Sit to Supine Rolling: Mod assist   Supine to sit: Max assist;HOB elevated Sit to supine: HOB elevated;Total assist   General bed mobility comments: Took ~7 minutes to  get to EOB secondary to pain. Multiple attempts of rolling to right and back to supine. VC for sequencing, hand placement and increased repetition/time to perform transfer. Assist with BLEs, trunk and to get bottom to EOB. Total A to return to supine.  Transfers Overall transfer level: Needs assistance Equipment used: Rolling walker (2 wheeled) Transfers: Sit to/from Stand Sit to Stand: Max assist         General transfer comment: Attempted to stand x2 from EOB, however questionable effort given. Complaints of dizziness and not wanting to place weight through RLE even with CAM boot donned. Unable to clear bottom.  Ambulation/Gait             General Gait Details: TBA.  Stairs            Wheelchair Mobility    Modified Rankin (Stroke Patients Only)       Balance Overall balance assessment: Needs assistance Sitting-balance support: Bilateral upper extremity supported Sitting balance-Leahy Scale: Poor Sitting balance - Comments: Sitting EOB ~20 minutes with intermittent moments (<10 sec) of SBA however consistently losing balance posteriorly and unable to self correct requiring Mod A to prevent fall backwards. VC to lean anteriorly. Use of RW for UE support helped with balance.  Total A to donn socks and CAM boott and gown. Postural control: Posterior lean     Standing balance comment: Unable to stand with Max A. Able to relieve some pressure on bottom to remove soiled pad.                             Pertinent Vitals/Pain Reports 7/10 pain at surgical incision. RN gave  pain medication prior to PT arrival. SOB present during session and Sa02 stayed around 90-91% during activity. Dizziness noted upon sitting EOB. Pt repositioned in supine, per request for comfort.     Home Living Family/patient expects to be discharged to:: Private residence Living Arrangements: Spouse/significant other Available Help at Discharge: Family;Available PRN/intermittently Type  of Home: House Home Access: Stairs to enter Entrance Stairs-Rails: Doctor, general practice of Steps: 2 Home Layout: One level Home Equipment: Walker - 2 wheels;Bedside commode;Wheelchair - manual;Shower seat      Prior Function Level of Independence: Independent   Gait / Transfers Assistance Needed: Pt reports being (I) with ADLs, however then reports she has not "stood up and walked in awhile." Difficult to ascertain PLOF/history.   ADL's / Homemaking Assistance Needed: Per PT note in March, pt was (I) up until Oct 2014 and since then has needed intermittent A with ADLs.         Hand Dominance   Dominant Hand: Right    Extremity/Trunk Assessment   Upper Extremity Assessment: Generalized weakness           Lower Extremity Assessment: Generalized weakness;RLE deficits/detail RLE Deficits / Details: Difficult to assess due to pain and pt not wanting to mobilize RLE.       Communication      Cognition Arousal/Alertness: Awake/alert Behavior During Therapy: Flat affect Overall Cognitive Status: No family/caregiver present to determine baseline cognitive functioning (Pt with history of cognitive baseline secondary to brain tumor in frontal lobe. ) Area of Impairment: Safety/judgement;Problem solving;Memory       Following Commands: Follows multi-step commands with increased time Safety/Judgement: Decreased awareness of deficits   Problem Solving: Slow processing;Decreased initiation;Requires verbal cues;Requires tactile cues;Difficulty sequencing General Comments: Takes 1-2 minutes to respond to questions. Inconsistent answers/responses to questions throughout session. Questionable historian. Tangential speech at times unrelated to topic/question asked.     General Comments General comments (skin integrity, edema, etc.): Pt became incontinent of urine during supine to sit transfer.    Exercises General Exercises - Lower Extremity Ankle Circles/Pumps:  Both;10 reps;Seated Long Arc Quad: Both;10 reps;Seated      Assessment/Plan    PT Assessment Patient needs continued PT services  PT Diagnosis Acute pain;Generalized weakness   PT Problem List Decreased strength;Pain;Cardiopulmonary status limiting activity;Decreased cognition;Decreased activity tolerance;Decreased knowledge of use of DME;Decreased balance;Decreased safety awareness;Decreased mobility;Decreased knowledge of precautions;Decreased skin integrity  PT Treatment Interventions DME instruction;Balance training;Gait training;Neuromuscular re-education;Cognitive remediation;Functional mobility training;Therapeutic activities;Patient/family education;Therapeutic exercise   PT Goals (Current goals can be found in the Care Plan section) Acute Rehab PT Goals Patient Stated Goal: to make this pain go away. PT Goal Formulation: With patient Time For Goal Achievement: 08/18/13 Potential to Achieve Goals: Fair    Frequency Min 2X/week   Barriers to discharge   Not sure of caregiver support at home.    Co-evaluation               End of Session Equipment Utilized During Treatment: Gait belt;Oxygen Activity Tolerance: Patient limited by pain;Other (comment) (Dizziness and poor sitting tolerance. ) Patient left: in bed;with call bell/phone within reach;with bed alarm set Nurse Communication: Mobility status;Precautions         Time: 8295-6213 PT Time Calculation (min): 38 min   Charges:   PT Evaluation $Initial PT Evaluation Tier I: 1 Procedure PT Treatments $Therapeutic Activity: 8-22 mins $Neuromuscular Re-education: 8-22 mins   PT G Codes:          Folan, Daiki Dicostanzo A 08/04/2013,  9:30 AM Alvie Heidelberg, PT, DPT [phone]

## 2013-08-04 NOTE — Discharge Summary (Signed)
PATIENT DETAILS Name: [name] Age: 58 y.o. Sex: female Date of Birth: [birth year] MRN: [id number]. Admit Date: 07/31/2013 Admitting Physician: Donne Hazel, MD [id number] Leslie Riddle, MD  Recommendations for Outpatient Follow-up:  1. Please repeat CBC and LFT's in 1 week to ensure LFT's have normalized. 2. 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 Brain cancer     Frontal lobe, 1993 and 2005  . Migraines   . Morton neuroma   . Seizures   . Migraine   . Fibromyalgia   . Right fibular fracture 07/12/2011  . 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 Anemia 03/06/2013    DISCHARGE MEDICATIONS:   Medication List    STOP taking these medications       fenofibrate 160 MG tablet     methocarbamol 500 MG tablet  Commonly known as:  ROBAXIN     omeprazole 20 MG capsule  Commonly known as:  PRILOSEC  Replaced by:  pantoprazole 40 MG tablet     promethazine 25 MG tablet  Commonly known as:  PHENERGAN     rosuvastatin 10 MG tablet  Commonly known as:  CRESTOR     traZODone 50 MG tablet  Commonly known as:  DESYREL      TAKE these medications       cephALEXin 500 MG capsule  Commonly known as:  KEFLEX  Take 1 capsule (500 mg total) by mouth every 8 (eight) hours. 5 more days from 7/30     CITRACAL + D PO  Take 1 tablet by mouth daily. CA 1200mg -Vitamin D 1000iu     DULoxetine 60 MG capsule  Commonly known as:  CYMBALTA  Take 60 mg by mouth daily.     fish oil-omega-3 fatty acids 1000 MG capsule  Take 1 g by mouth 2 (two) times daily.     HYDROcodone-acetaminophen 5-325 MG per tablet  Commonly known as:  NORCO/VICODIN  Take 1-2 tablets by mouth  every 6 (six) hours as needed for moderate pain.     KEPPRA 500 MG tablet  Generic drug:  levETIRAcetam  Take 1,500 mg by mouth 2 (two) times daily.     Linaclotide 145 MCG Caps capsule  Commonly known as:  LINZESS  Take 1 capsule (145 mcg total) by mouth daily.     meloxicam 15 MG tablet  Commonly known as:  MOBIC  Take 15 mg by mouth daily.     pantoprazole 40 MG tablet  Commonly known as:  PROTONIX  Take 1 tablet (40 mg total) by mouth daily.     pregabalin 150 MG capsule  Commonly known as:  LYRICA  Take 1 capsule (150 mg total) by mouth daily.        ALLERGIES:   Allergies  Allergen Reactions  . Versed [Midazolam] Other (See Comments) and Anaphylaxis    Severe respiratory depression, had to be masked in preop holding.  . Ciprofloxacin     REACTION: NAUSEA AND VOMITING  . Cyclobenzaprine Itching    Other reaction(s): Itching / Pruritis (ALLERGY/intolerance)  . Methocarbamol     REACTION: itching Patient currently tolerating it as its on her  home med list.  . Metronidazole   . Penicillins Rash    REACTION: RASH    BRIEF HPI:  See H&P, Labs, Consult and Test reports for all details in brief, patient is a 58 year old female with multiple medical problems including seizures status post craniotomy, fibromyalgia, who was admitted with worsening abdominal pain. She was admitted for further evaluation and treatment.   CONSULTATIONS:   general surgery  PERTINENT RADIOLOGIC STUDIES: Ct Abdomen Pelvis Wo Contrast  07/31/2013   CLINICAL DATA:  Abdominal pain for the past 2 weeks more severe today.  EXAM: CT ABDOMEN AND PELVIS WITHOUT CONTRAST  TECHNIQUE: Multidetector CT imaging of the abdomen and pelvis was performed following the standard protocol without IV contrast.  COMPARISON:  CT of the abdomen and pelvis 06/04/2013.  FINDINGS: Lung Bases: There is either a trace amount of intermediate attenuation fluid or there is pleural thickening in the posterior aspect of the  lower right hemithorax. Small amount of subsegmental atelectasis in the lower lobes of the lungs bilaterally.  Abdomen/Pelvis: Some amorphous high attenuation material is noted in the dependent portion of the gallbladder, favored to predominantly reflect some biliary sludge, although some small partially calcified gallstones are also possible. No current findings to suggest an acute cholecystitis at this time. The unenhanced appearance of the liver, pancreas, spleen, bilateral adrenal glands and the right kidney is unremarkable. The left kidney is mildly atrophic and there are some faint amorphous medullary calcifications within the left kidney.  Small volume of ascites. No pneumoperitoneum. No pathologic distention of small bowel. The uterus and ovaries are unremarkable in appearance. No lymphadenopathy identified within the abdomen or pelvis on today's non contrast CT examination. Urinary bladder is normal in appearance.  Musculoskeletal: There are no aggressive appearing lytic or blastic lesions noted in the visualized portions of the skeleton.  IMPRESSION: 1. There appears to be biliary sludge and potentially tiny gallstones in the gallbladder. No current findings to suggest acute cholecystitis at this time. 2. Trace amount of intermediate attenuation pleural fluid and/or pleural thickening in the posterior aspect of the lower right hemithorax. This is of uncertain etiology and significance. 3. Small volume of ascites. 4. Additional incidental findings, as above.   Electronically Signed   By: Vinnie Langton M.D.   On: 07/31/2013 15:57   Dg Chest 2 View  07/31/2013   CLINICAL DATA:  ANKLE PAIN ABDOMINAL PAIN  EXAM: CHEST - 2 VIEW  COMPARISON:  03/06/2013  FINDINGS: Linear scarring or subsegmental atelectasis in the right mid lung as before. Left lung clear. Heart size normal. No effusion. Regional bones unremarkable.  IMPRESSION: 1. No acute disease   Electronically Signed   By: Arne Cleveland M.D.   On:  07/31/2013 14:17   Dg Tibia/fibula Right  07/31/2013   CLINICAL DATA:  ANKLE PAIN ABDOMINAL PAIN  EXAM: RIGHT TIBIA AND FIBULA - 2 VIEW  COMPARISON:  07/12/2011  FINDINGS: Stable postop changes in the distal fibula. No acute fracture or dislocation. Normal alignment and mineralization. No significant osseous degenerative change.  IMPRESSION: No acute disease   Electronically Signed   By: Arne Cleveland M.D.   On: 07/31/2013 12:26   Dg Ankle Complete Right  07/31/2013   CLINICAL DATA:  ANKLE PAIN ABDOMINAL PAIN  EXAM: RIGHT ANKLE - COMPLETE 3+ VIEW  COMPARISON:  07/12/2011  FINDINGS: Interval Removal of cast material. Stable plate and screw fixation of distal fibula. Small cortical avulsion injury from the medial malleolus, of uncertain chronicity. Ankle mortise intact.  Normal mineralization and alignment. Calcaneal spurs at the plantar aponeurosis and Achilles tendon insertion.  IMPRESSION: 1. Medial malleolus cortical avulsion injury, age indeterminate. Correlate with point tenderness 2. Stable postop changes distal fibula. 3. Calcaneal spurs.   Electronically Signed   By: Arne Cleveland M.D.   On: 07/31/2013 12:25   Dg Abd 1 View  07/12/2013   CLINICAL DATA:  Ureteral calculus.  EXAM: ABDOMEN - 1 VIEW  COMPARISON:  Radiograph, 06/10/2013 and CT, 06/04/2013.  FINDINGS: No convincing renal or ureteral stone.  Normal bowel gas pattern. Soft tissues are unremarkable. Mild degenerative changes of the lower lumbar spine.  IMPRESSION: 1. No radiographic evidence of a renal or ureteral stone. No acute findings.   Electronically Signed   By: Lajean Manes M.D.   On: 07/12/2013 15:47   Nm Hepato W/eject Fract  07/12/2013   CLINICAL DATA:  Right upper quadrant pain  EXAM: NUCLEAR MEDICINE HEPATOBILIARY IMAGING WITH GALLBLADDER EF  TECHNIQUE: Sequential images of the abdomen were obtained out to 60 minutes following intravenous administration of radiopharmaceutical. After slow intravenous infusion of 1.8  micrograms Cholecystokinin, gallbladder ejection fraction was determined.  RADIOPHARMACEUTICALS:  5.5 Millicurie EQ-68T Choletec  COMPARISON:  None.  FINDINGS: There is immediate homogeneous uptake of radiotracer in the liver. Filling of the gallbladder begins at 30 minutes. Radiotracer uptake is present in the small bowel at 50 minutes.  At the end of 1-hour, there is relatively good clearance of activity from the liver.  When gallbladder filling was complete, the patient was given an IV infusion of CCK. At 30 min the gallbladder ejection fraction measures 85.6%. At 30 min, normal ejection fraction is greater than 30%.  The patient did not experience symptoms during CCK infusion.  IMPRESSION: 1. Normal hepatobiliary scan. 2. Normal gallbladder ejection fraction.   Electronically Signed   By: Kathreen Devoid   On: 07/12/2013 15:30   US Abdomen Limited Ruq  07/31/2013   CLINICAL DATA:  Right upper quadrant abdominal pain.  EXAM: US ABDOMEN LIMITED - RIGHT UPPER QUADRANT  COMPARISON:  CT of the abdomen and pelvis 06/04/2013.  FINDINGS: Gallbladder:  Multiple echogenic foci with posterior acoustic shadowing throughout the gallbladder, compatible with calcified gallstones. Gallbladder is moderately distended. Gallbladder wall thickness is normal at 2.8 mm. No abnormal pericholecystic fluid. Per report from the sonographer, the patient did not exhibit a sonographic Murphy's sign on examination.  Common bile duct:  Diameter: 5.5 mm.  Liver:  No focal lesion identified. Within normal limits in parenchymal echogenicity.  IMPRESSION: 1. Cholelithiasis without evidence to suggest acute cholecystitis.   Electronically Signed   By: Vinnie Langton M.D.   On: 07/31/2013 14:07     PERTINENT LAB RESULTS: CBC:  Recent Labs  08/03/13 0620 08/04/13 0855  WBC 7.0 9.0  HGB 9.2* 10.6*  HCT 27.9* 32.2*  PLT 211 270   CMET CMP     Component Value Date/Time   NA 140 08/04/2013 0605   K 3.7 08/04/2013 0605   CL 104  08/04/2013 0605   CO2 24 08/04/2013 0605   GLUCOSE 80 08/04/2013 0605   GLUCOSE 93 04/17/2009 1510   BUN 9 08/04/2013 0605   CREATININE 0.61 08/04/2013 0605   CALCIUM 8.6 08/04/2013 0605   PROT 6.0 08/04/2013 0605   ALBUMIN 2.5* 08/04/2013 0605   AST 158* 08/04/2013 0605   ALT 225* 08/04/2013 0605   ALKPHOS 117 08/04/2013 0605   BILITOT 0.8 08/04/2013 0605   GFRNONAA >90 08/04/2013 4196  GFRAA >90 08/04/2013 0605    GFR Estimated Creatinine Clearance: 82 ml/min (by C-G formula based on Cr of 0.61). No results found for this basename: LIPASE, AMYLASE,  in the last 72 hours No results found for this basename: CKTOTAL, CKMB, CKMBINDEX, TROPONINI,  in the last 72 hours No components found with this basename: POCBNP,  No results found for this basename: DDIMER,  in the last 72 hours No results found for this basename: HGBA1C,  in the last 72 hours No results found for this basename: CHOL, HDL, LDLCALC, TRIG, CHOLHDL, LDLDIRECT,  in the last 72 hours No results found for this basename: TSH, T4TOTAL, FREET3, T3FREE, THYROIDAB,  in the last 72 hours No results found for this basename: VITAMINB12, FOLATE, FERRITIN, TIBC, IRON, RETICCTPCT,  in the last 72 hours Coags: No results found for this basename: PT, INR,  in the last 72 hours Microbiology: Recent Results (from the past 240 hour(s))  URINE CULTURE     Status: None   Collection Time    07/31/13 10:50 AM      Result Value Ref Range Status   Specimen Description URINE, CATHETERIZED   Final   Special Requests NONE   Final   Culture  Setup Time     Final   Value: 07/31/2013 23:03     Performed at [facility]     Final   Value: >=100,000 COLONIES/ML     Performed at [facility]   Culture     Final   Value: KLEBSIELLA OXYTOCA     Performed at [facility]   Report Status 08/02/2013 FINAL   Final   Organism ID, Bacteria KLEBSIELLA OXYTOCA   Final  SURGICAL PCR SCREEN     Status: None   Collection Time      08/01/13 10:38 PM      Result Value Ref Range Status   MRSA, PCR NEGATIVE  NEGATIVE Final   Staphylococcus aureus NEGATIVE  NEGATIVE Final   Comment:            The Xpert SA Assay (FDA     approved for NASAL specimens     in patients over 57 years of age),     is one component of     a comprehensive surveillance     program.  Test performance has     been validated by [employer] for patients greater     than or equal to 35 year old.     It is not intended     to diagnose infection nor to     guide or monitor treatment.     BRIEF HOSPITAL COURSE:  Pyelonephritis  - Admitted and started on Rocephin, urine culture positive for Klebsiella oxytoca- pan sensitive. Stopped Rocephin, switched to Keflex, plan for a total of at least 7-10 days of therapy. Afebrile, without leukocytosis.   Active Problems:  Acute calculus cholecystitis  - Seen by general surgery, underwent laparoscopic cholecystectomy on 7/28  - No major events postop, minimally confused, advance diet- which she seems to have tolerated well without any major issues. - Will need to followup with Gen. surgery post discharge.  Elevated liver enzymes  - Likely secondary to cholecystitis.  - Downtrending, will need further followup of LFTs as outpatient to make sure they normalize.   Fibromyalgia  - Stable, continue with lyrica- but given excessive sedation, have decreased dose to 150 mg.  History of seizure  disorder  - Continue with Keppra   Recent right ankle fracture  - Apparently a few days prior to admission following a mechanical fall. ED M.D. spoke with Dr. Marcelino Scot over the phone, who advised fracture boot and outpatient followup. I have since spoken with Dr. Dani Gobble primary orthopedic surgeon, who has reviewed the x-ray films, advised that patient just needs to continue on the fracture boot, and followup with him in the office in a few weeks. He thinks that the fracture may be old. 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   Polypharmacy - Family, patient is excessively sedated at times, we have minimize her narcotics during her stay here. Have decreased Lyrica 250 mg. Please continue to optimize/minimize medications in the outpatient setting  TODAY-DAY OF DISCHARGE:  Subjective:   Alieyah Spader today has no headache,no chest abdominal pain,no new weakness tingling or numbness, feels much better wants to go home today.   Objective:   Blood pressure 113/76, pulse 87, temperature 99.8 F (37.7 C), temperature source Oral, resp. rate 16, height 5\' 2"  (1.575 m), weight 88.406 kg (194 lb 14.4 oz), SpO2 92.00%.  Intake/Output Summary (Last 24 hours) at 08/04/13 1010 Last data filed at 08/03/13 1830  Gross per 24 hour  Intake    444 ml  Output      0 ml  Net    444 ml   Filed Weights   07/31/13 1800  Weight: 88.406 kg (194 lb 14.4 oz)    Exam Awake Alert, Oriented *3, No new F.N deficits, Normal affect [place].AT,PERRAL Supple Neck,No JVD, No cervical lymphadenopathy appriciated.  Symmetrical Chest wall movement, Good air movement bilaterally, CTAB RRR,No Gallops,Rubs or new Murmurs, No Parasternal Heave +ve B.Sounds, Abd Soft, Non tender, No organomegaly appriciated, No rebound -guarding or rigidity. No Cyanosis, Clubbing or edema, No new Rash or bruise  DISCHARGE CONDITION: Stable  DISPOSITION: SNF  DISCHARGE INSTRUCTIONS:    Activity:  As tolerated with Full fall precautions use walker/cane & assistance as needed  Diet recommendation: Heart Healthy diet  Discharge Instructions   Call MD for:  redness, tenderness, or signs of infection (pain, swelling, redness, odor or green/yellow discharge around incision site)    Complete by:  As directed      Diet - low  sodium heart healthy    Complete by:  As directed      Increase activity slowly    Complete by:  As directed            Follow-up Information   Follow up with Annye Asa, MD. Schedule an appointment as soon as possible for a visit in 1 week.   Specialty:  Family Medicine   Contact information:   [phone] [address] [phone]       Follow up with Gwenyth Ober, MD. Schedule an appointment as soon as possible for a visit in 2 weeks.   Specialty:  General Surgery   Contact information:   [address], [place] [address] [phone]       Follow up with Johnny Bridge, MD. Schedule an appointment as soon as possible for a visit in 1 week.   Specialty:  Orthopedic Surgery   Contact information:  [address] [place] [address] [phone]       Total Time spent on discharge equals 45 minutes.  SignedOren Binet 08/04/2013 10:10 AM  **Disclaimer: This note may have been dictated with voice recognition software. Similar sounding words can inadvertently be transcribed and this note may contain transcription errors which may not have been corrected upon publication of note.**

## 2013-08-05 NOTE — Clinical Social Work Psychosocial (Signed)
Clinical Social Work Department BRIEF PSYCHOSOCIAL ASSESSMENT 08/05/2013  Patient:  [name], [name]     Account Number:  [id number]     [place] date:  07/31/2013  Clinical Social Worker:  Lovey Newcomer  Date/Time:  08/04/2013 10:09 AM  Referred by:  Physician  Date Referred:  08/04/2013 Referred for  SNF Placement   Other Referral:   Interview type:  Family Other interview type:   Patient and husband interviewed at bedside to complete assessment.    PSYCHOSOCIAL DATA Living Status:  HUSBAND Admitted from facility:   Level of care:   Primary support name:  [name] Primary support relationship to patient:  SPOUSE Degree of support available:   Support is good.    CURRENT CONCERNS Current Concerns  Post-Acute Placement   Other Concerns:    SOCIAL WORK ASSESSMENT / PLAN CSW met with patient and husband at bedside to complete assessment. Patient appears somewhat confused. Patient's husband states that he agrees that the patient needs to DC to a SNF. CSW explained that patient is medically stable for DC today and that he may need to make a quick decision about which facility patient goes to. Patient's husband [name] states that the patient has been to [facility] before and states that this is the preferred facility. CSW explained that this facility cannot be guaranteed for patient but referrals will be made. Patient's husband verbalized understanding of SNF search/placement process. CSW inquired about husband's long term care plan for patient as patient appears to be in need of long term placement. [name] expressed how stressed and overwhelemed he has been with caring for his wife. CSW explained that [name] needs to plan for making arrangements for long term care for patient. CSW encouraged [name] to apply for Medicaid or start a plan to pay privately for patient's long term care. CSW will assist with DC.   Assessment/plan status:  Psychosocial Support/Ongoing  Assessment of Needs Other assessment/ plan:   Complete Fl2, Fax, PASRR   Information/referral to community resources:   CSW contact information and SNF list given.    PATIENT'S/FAMILY'S RESPONSE TO PLAN OF CARE: Patient and patient's husband [name] are in agreement that SNF placement is needed for patient at discharge. CSW will follow up with bed offers.       Liz Beach MSW, [place], [place], [phone]

## 2013-08-08 ENCOUNTER — Encounter (INDEPENDENT_AMBULATORY_CARE_PROVIDER_SITE_OTHER): Payer: 59 | Admitting: General Surgery

## 2013-08-12 ENCOUNTER — Encounter (INDEPENDENT_AMBULATORY_CARE_PROVIDER_SITE_OTHER): Payer: Self-pay | Admitting: General Surgery

## 2013-08-12 ENCOUNTER — Ambulatory Visit (INDEPENDENT_AMBULATORY_CARE_PROVIDER_SITE_OTHER): Payer: 59 | Admitting: General Surgery

## 2013-08-12 VITALS — BP 120/70 | HR 72 | Temp 97.8°F | Ht 62.0 in | Wt 202.0 lb

## 2013-08-12 DIAGNOSIS — Z09 Encounter for follow-up examination after completed treatment for conditions other than malignant neoplasm: Secondary | ICD-10-CM

## 2013-08-12 NOTE — Progress Notes (Signed)
Subjective:     Patient ID: [name], female   DOB: 1955-06-19, 58 y.o.   MRN: [id number]  HPI Patient is status post laparoscopic cholecystectomy and doing well. She is not eating normally but close to it. She's had no fevers or chills.   Review of Systems Nothing to report.    Objective:   Physical Exam On examination her wounds have healed well. The dressings were removed and incisions look fine. There is no evidence of infection. She has no dominant tenderness. She has excellent bowel sounds.    Assessment:     Doing well status post laparoscopic cholecystectomy.     Plan:     Return to see me as needed.  Patient can shower as necessary.

## 2013-08-15 ENCOUNTER — Ambulatory Visit: Payer: 59 | Admitting: Family Medicine

## 2013-08-17 ENCOUNTER — Encounter (INDEPENDENT_AMBULATORY_CARE_PROVIDER_SITE_OTHER): Payer: 59 | Admitting: General Surgery

## 2013-08-31 ENCOUNTER — Encounter: Payer: Self-pay | Admitting: Family Medicine

## 2013-08-31 ENCOUNTER — Ambulatory Visit (INDEPENDENT_AMBULATORY_CARE_PROVIDER_SITE_OTHER): Payer: 59 | Admitting: Family Medicine

## 2013-08-31 VITALS — BP 132/84 | HR 115 | Temp 98.1°F | Resp 16 | Wt 204.1 lb

## 2013-08-31 DIAGNOSIS — K759 Inflammatory liver disease, unspecified: Secondary | ICD-10-CM

## 2013-08-31 DIAGNOSIS — I10 Essential (primary) hypertension: Secondary | ICD-10-CM

## 2013-08-31 LAB — CBC WITH DIFFERENTIAL/PLATELET
Basophils Absolute: 0 10*3/uL (ref 0.0–0.1)
Basophils Relative: 0.7 % (ref 0.0–3.0)
Eosinophils Absolute: 0.2 10*3/uL (ref 0.0–0.7)
Eosinophils Relative: 4.1 % (ref 0.0–5.0)
HEMATOCRIT: 33.8 % — AB (ref 36.0–46.0)
HEMOGLOBIN: 11 g/dL — AB (ref 12.0–15.0)
LYMPHS ABS: 1.5 10*3/uL (ref 0.7–4.0)
Lymphocytes Relative: 30 % (ref 12.0–46.0)
MCHC: 32.5 g/dL (ref 30.0–36.0)
MCV: 88.4 fl (ref 78.0–100.0)
MONO ABS: 0.4 10*3/uL (ref 0.1–1.0)
Monocytes Relative: 7.5 % (ref 3.0–12.0)
NEUTROS ABS: 3 10*3/uL (ref 1.4–7.7)
Neutrophils Relative %: 57.7 % (ref 43.0–77.0)
Platelets: 277 10*3/uL (ref 150.0–400.0)
RBC: 3.82 Mil/uL — AB (ref 3.87–5.11)
RDW: 15.3 % (ref 11.5–15.5)
WBC: 5.1 10*3/uL (ref 4.0–10.5)

## 2013-08-31 LAB — HEPATIC FUNCTION PANEL
ALT: 15 U/L (ref 0–35)
AST: 21 U/L (ref 0–37)
Albumin: 3.7 g/dL (ref 3.5–5.2)
Alkaline Phosphatase: 74 U/L (ref 39–117)
BILIRUBIN TOTAL: 0.5 mg/dL (ref 0.2–1.2)
Bilirubin, Direct: 0 mg/dL (ref 0.0–0.3)
Total Protein: 7.7 g/dL (ref 6.0–8.3)

## 2013-08-31 LAB — BASIC METABOLIC PANEL
BUN: 23 mg/dL (ref 6–23)
CALCIUM: 9.5 mg/dL (ref 8.4–10.5)
CO2: 24 mEq/L (ref 19–32)
Chloride: 108 mEq/L (ref 96–112)
Creatinine, Ser: 1 mg/dL (ref 0.4–1.2)
GFR: 61.68 mL/min (ref >60.00)
GLUCOSE: 83 mg/dL (ref 70–99)
Potassium: 4.4 mEq/L (ref 3.5–5.1)
Sodium: 140 mEq/L (ref 135–145)

## 2013-08-31 MED ORDER — LOSARTAN POTASSIUM-HCTZ 50-12.5 MG PO TABS: 1.0000 | ORAL_TABLET | Freq: Every day | ORAL | Status: DC

## 2013-08-31 NOTE — Progress Notes (Signed)
   Subjective:    Patient ID: [name], female    DOB: 1955-07-29, 58 y.o.   MRN: [id number]  HPI Labile BP- pt reports that during her hospitalization BP 'went from real high to low'.  Pt not checking home BPs- just got home from rehab facility on Sunday.  No dizziness.  No CP, SOB.  Pt was started on Losartan HCTZ 100/12.5mg .  Elevated LFTs- noted during pt's hospitalization for cholecystectomy.  No abd pain, N/V.   Review of Systems For ROS see HPI     Objective:   Physical Exam  Vitals reviewed. Constitutional: She is oriented to person, place, and time. She appears well-developed and well-nourished. No distress.  HENT:  Head: Normocephalic and atraumatic.  Eyes: Conjunctivae and EOM are normal. Pupils are equal, round, and reactive to light.  Neck: Normal range of motion. Neck supple. No thyromegaly present.  Cardiovascular: Normal rate, regular rhythm, normal heart sounds and intact distal pulses.   No murmur heard. Pulmonary/Chest: Effort normal and breath sounds normal. No respiratory distress.  Abdominal: Soft. She exhibits no distension. There is no tenderness.  Musculoskeletal: She exhibits no edema.  Lymphadenopathy:    She has no cervical adenopathy.  Neurological: She is alert and oriented to person, place, and time.  Skin: Skin is warm and dry.  Psychiatric: She has a normal mood and affect. Her behavior is normal.          Assessment & Plan:

## 2013-08-31 NOTE — Progress Notes (Signed)
Pre visit review using our clinic review tool, if applicable. No additional management support is needed unless otherwise documented below in the visit note. 

## 2013-08-31 NOTE — Patient Instructions (Signed)
Schedule your complete physical in 3-4 months We'll notify you of your lab results and make any changes if needed Continue the Losartan-HCTZ daily for blood pressure Call with any questions or concerns Happy Early Rudene Anda!

## 2013-09-01 ENCOUNTER — Telehealth: Payer: Self-pay

## 2013-09-01 NOTE — Telephone Encounter (Signed)
Verbal ok given.

## 2013-09-01 NOTE — Telephone Encounter (Signed)
Ok for verbal order  °

## 2013-09-01 NOTE — Telephone Encounter (Signed)
Caprice Red OT from [facility] called with Plan of care OT 1wk for 1wk        2wk for 1wk        1 time wk for 2 wks Exercise,transfers, mobility [employer] and Balance If disagree call him at [phone]

## 2013-09-04 NOTE — Assessment & Plan Note (Signed)
New to provider, noted during pt's hospitalization for cholecystectomy.  Due for repeat labs to ensure normalization.

## 2013-09-04 NOTE — Assessment & Plan Note (Signed)
New.  Pt was recently started on Losartan HCTZ for elevated BP.  Pt is asymptomatic.  Due for BMP to assess Cr and K+.  Pt to continue meds at this time.  Pt expressed understanding and is in agreement w/ plan.

## 2013-09-07 ENCOUNTER — Telehealth: Payer: Self-pay | Admitting: Family Medicine

## 2013-09-07 ENCOUNTER — Ambulatory Visit: Payer: 59 | Admitting: Family Medicine

## 2013-09-07 NOTE — Telephone Encounter (Signed)
Caller name: Shanon Brow  Relation to pt: spouse  Call back number: [phone]   Reason for call: pt requesting a rx for a hospital bed within home.

## 2013-09-07 NOTE — Telephone Encounter (Signed)
We can write script for DME but unclear if pt's insurance will pay for this if they go to [facility].  We can also check w/ a home care agency as pt has multiple qualifying dx (leg weakness, fibromyalgia) but she will likely need a face-to-face to document medical need

## 2013-09-08 NOTE — Telephone Encounter (Signed)
[facility] will bill insurance as long as provider includes a letter with the Rx that indicates why pt needs bed.

## 2013-09-09 ENCOUNTER — Encounter: Payer: Self-pay | Admitting: Family Medicine

## 2013-09-09 NOTE — Telephone Encounter (Signed)
Spoke with pt husband who advised he is going to speak with their insurance to find out who is covered 100% instead. Waiting on the call back or mychart message.

## 2013-09-09 NOTE — Telephone Encounter (Signed)
Letter provided

## 2013-09-09 NOTE — Telephone Encounter (Signed)
Faxed over letter, pt demographic page, and insurance information to [facility] on 09/09/13 (fax # [phone])

## 2013-09-12 ENCOUNTER — Encounter: Payer: Self-pay | Admitting: Family Medicine

## 2013-09-13 NOTE — Progress Notes (Unsigned)
Caller name:  Mr. Rayfield  Relation to pt: spouse  Call back number: [phone]    Reason for call:   pt f/u on rx for home hospital bed

## 2013-09-14 NOTE — Telephone Encounter (Signed)
rx completed and pt husband notified.

## 2013-09-15 DIAGNOSIS — S8290XD Unspecified fracture of unspecified lower leg, subsequent encounter for closed fracture with routine healing: Secondary | ICD-10-CM

## 2013-09-15 DIAGNOSIS — Z5189 Encounter for other specified aftercare: Secondary | ICD-10-CM

## 2013-09-15 DIAGNOSIS — F3289 Other specified depressive episodes: Secondary | ICD-10-CM

## 2013-09-15 DIAGNOSIS — F329 Major depressive disorder, single episode, unspecified: Secondary | ICD-10-CM

## 2013-09-15 DIAGNOSIS — C711 Malignant neoplasm of frontal lobe: Secondary | ICD-10-CM

## 2013-09-23 ENCOUNTER — Telehealth: Payer: Self-pay | Admitting: Family Medicine

## 2013-09-23 NOTE — Telephone Encounter (Signed)
Called and left a detailed message giving the verbal ok.

## 2013-09-23 NOTE — Telephone Encounter (Signed)
Caller name: Timmothy Sours Relation to pt: other / OT  Call back number: [phone]   Reason for call:   Discharge goals retrieving clothing from closet dressing and shower transfer goals are unrealistic pt is at max level with these task will focus soly on toilet transfer pt continues to lend back heavly during sit to stand and stand to sit. OT requesting 1 week of therapy needs approval

## 2013-09-23 NOTE — Telephone Encounter (Signed)
[place] for OT order

## 2013-09-29 ENCOUNTER — Other Ambulatory Visit: Payer: Self-pay | Admitting: Internal Medicine

## 2013-10-05 ENCOUNTER — Other Ambulatory Visit: Payer: Self-pay | Admitting: Internal Medicine

## 2013-10-06 ENCOUNTER — Encounter: Payer: Self-pay | Admitting: Family Medicine

## 2013-10-10 MED ORDER — HYDROCODONE-ACETAMINOPHEN 5-325 MG PO TABS: 1.0000 | ORAL_TABLET | Freq: Four times a day (QID) | ORAL | Status: DC | PRN

## 2013-10-10 NOTE — Telephone Encounter (Signed)
Please advise on this rx request for Hydrocodone. You were not original prescriber.   Pt last seen 08/31/13.  Hydrocodone was prescribed for Headaches while in hospital.

## 2013-10-10 NOTE — Telephone Encounter (Signed)
Medication printed and signed by PA Mackie Pai since Donnelly not in office. Pt notified.

## 2013-11-14 ENCOUNTER — Other Ambulatory Visit: Payer: Self-pay | Admitting: Family Medicine

## 2013-11-14 NOTE — Telephone Encounter (Signed)
Last OV 08-31-13 lyrica last filled 06-06-13 #60 with 3

## 2013-11-14 NOTE — Telephone Encounter (Signed)
Med filled and faxed.  

## 2013-12-05 ENCOUNTER — Other Ambulatory Visit: Payer: Self-pay | Admitting: Family Medicine

## 2013-12-05 NOTE — Telephone Encounter (Signed)
Med filled.  

## 2013-12-08 ENCOUNTER — Ambulatory Visit (INDEPENDENT_AMBULATORY_CARE_PROVIDER_SITE_OTHER): Payer: 59 | Admitting: General Practice

## 2013-12-08 ENCOUNTER — Encounter: Payer: Self-pay | Admitting: Family Medicine

## 2013-12-08 ENCOUNTER — Ambulatory Visit (INDEPENDENT_AMBULATORY_CARE_PROVIDER_SITE_OTHER): Payer: 59 | Admitting: Family Medicine

## 2013-12-08 VITALS — BP 101/68 | HR 109 | Temp 97.9°F | Resp 16

## 2013-12-08 DIAGNOSIS — G219 Secondary parkinsonism, unspecified: Secondary | ICD-10-CM | POA: Insufficient documentation

## 2013-12-08 DIAGNOSIS — L8992 Pressure ulcer of unspecified site, stage 2: Secondary | ICD-10-CM

## 2013-12-08 DIAGNOSIS — Z23 Encounter for immunization: Secondary | ICD-10-CM

## 2013-12-08 DIAGNOSIS — Z0000 Encounter for general adult medical examination without abnormal findings: Secondary | ICD-10-CM

## 2013-12-08 MED ORDER — HYDROCORTISONE ACE-PRAMOXINE 2.5-1 % RE CREA: 1.0000 "application " | TOPICAL_CREAM | Freq: Three times a day (TID) | RECTAL | Status: DC

## 2013-12-08 NOTE — Patient Instructions (Signed)
Follow up in 6 months to recheck BP and cholesterol We'll notify you of your lab results and make any changes if needed Try and shift your weight every 2 hrs to avoid direct pressure on any one area for extended periods of time Continue to clean daily with peroxide Apply Neosporin daily Get a butt donut at a medical supply store to take the pressure off the wound If the wound becomes deeper or develops pus, spreading redness, or other concerns- please call Call and schedule your mammogram at your [phone] Call with any questions or concerns Happy Holidays!!!

## 2013-12-08 NOTE — Assessment & Plan Note (Signed)
Pt's PE is concerning for marked physical decline since last visit.  Skin is sallow in color- check labs to r/o underlying liver abnormality.  Pt w/ marked LE weakness and inability to stand- has neuro f/u.  Due for mammo.  Pt to schedule at her convenience.  Check labs.  Anticipatory guidance provided.

## 2013-12-08 NOTE — Progress Notes (Signed)
   Subjective:    Patient ID: [name], female    DOB: 08-01-1955, 58 y.o.   MRN: [id number]  HPI CPE- UTD on colonoscopy, DEXA, pap.  Due for mammo.   Review of Systems Patient reports no vision/ hearing changes, adenopathy,fever, weight change,  persistant/recurrent hoarseness , swallowing issues, chest pain, palpitations, edema, persistant/recurrent cough, hemoptysis, dyspnea (rest/exertional/paroxysmal nocturnal), gastrointestinal bleeding (melena, rectal bleeding), abdominal pain, significant heartburn, bowel changes, Gyn symptoms (abnormal  bleeding, pain),  syncope, memory loss, numbness & tingling, hair/nail changes, abnormal bruising or bleeding, anxiety, or depression.   + sore on buttock + hemorrhoids + bladder incontinence + weakness- in wheelchair    Objective:   Physical Exam  Constitutional:  Very pale, sallow in color sitting in wheel chair.  Appears much older than stated age  HENT:  Head: Normocephalic and atraumatic.  Nose: Nose normal.  Mouth/Throat: Oropharynx is clear and moist. No oropharyngeal exudate.  TMs WNL bilaterally  Eyes: Conjunctivae and EOM are normal. Pupils are equal, round, and reactive to light.  Neck: Normal range of motion. Neck supple. No thyromegaly present.  Cardiovascular: Normal rate, regular rhythm, normal heart sounds and intact distal pulses.   Pulmonary/Chest: Effort normal and breath sounds normal. No respiratory distress. She has no wheezes. She has no rales.  Abdominal: Soft. Bowel sounds are normal. She exhibits no distension. There is no tenderness. There is no rebound.  Genitourinary:  deferred  Musculoskeletal: She exhibits no edema.  Lymphadenopathy:    She has no cervical adenopathy.  Neurological: She has normal reflexes.  Pt unable to stand for more than 3 minutes w/o exceptional difficulty/weakness Slowed reaction time/delayed responses  Skin: Skin is warm and dry.  Pt w/ 1 cm stage 2 pressure ulcer on R buttock- no  evidence of superimposed infection Sallow, yellow-greenish hue  Psychiatric:  Flat affect withdrawn  Vitals reviewed.         Assessment & Plan:

## 2013-12-08 NOTE — Progress Notes (Signed)
Pre visit review using our clinic review tool, if applicable. No additional management support is needed unless otherwise documented below in the visit note. 

## 2013-12-08 NOTE — Assessment & Plan Note (Signed)
New.  No evidence of infxn.  Reviewed wound care w/ husband and red flags that should prompt return.

## 2013-12-09 LAB — PREALBUMIN: Prealbumin: 17.7 mg/dL (ref 17.0–34.0)

## 2013-12-10 LAB — TSH: TSH: 1.15 u[IU]/mL (ref 0.35–4.50)

## 2013-12-10 LAB — VITAMIN D 25 HYDROXY (VIT D DEFICIENCY, FRACTURES): VITD: 46.47 ng/mL (ref 30.00–100.00)

## 2013-12-11 LAB — HEPATIC FUNCTION PANEL
ALBUMIN: 4.2 g/dL (ref 3.5–5.2)
ALT: 10 U/L (ref 0–35)
AST: 21 U/L (ref 0–37)
Alkaline Phosphatase: 33 U/L — ABNORMAL LOW (ref 39–117)
Bilirubin, Direct: 0.2 mg/dL (ref 0.0–0.3)
Total Bilirubin: 0.5 mg/dL (ref 0.2–1.2)
Total Protein: 7.4 g/dL (ref 6.0–8.3)

## 2013-12-11 LAB — BASIC METABOLIC PANEL
BUN: 42 mg/dL — AB (ref 6–23)
CO2: 15 mEq/L — ABNORMAL LOW (ref 19–32)
CREATININE: 1.6 mg/dL — AB (ref 0.4–1.2)
Calcium: 9.6 mg/dL (ref 8.4–10.5)
Chloride: 111 mEq/L (ref 96–112)
GFR: 35.92 mL/min — AB (ref >60.00)
Glucose, Bld: 89 mg/dL (ref 70–99)
POTASSIUM: 4.4 meq/L (ref 3.5–5.1)
Sodium: 144 mEq/L (ref 135–145)

## 2013-12-11 LAB — LIPID PANEL
CHOL/HDL RATIO: 3
Cholesterol: 133 mg/dL (ref 0–200)
HDL: 39.7 mg/dL (ref >39.00)
LDL CALC: 69 mg/dL (ref 0–99)
NonHDL: 93.3
Triglycerides: 121 mg/dL (ref 0.0–149.0)
VLDL: 24.2 mg/dL (ref 0.0–40.0)

## 2013-12-12 ENCOUNTER — Other Ambulatory Visit: Payer: Self-pay | Admitting: Family Medicine

## 2013-12-12 ENCOUNTER — Encounter: Payer: Self-pay | Admitting: Family Medicine

## 2013-12-12 DIAGNOSIS — R7989 Other specified abnormal findings of blood chemistry: Secondary | ICD-10-CM

## 2013-12-13 MED ORDER — TRAZODONE HCL 50 MG PO TABS: 50.0000 mg | ORAL_TABLET | Freq: Every day | ORAL | Status: DC

## 2013-12-13 NOTE — Telephone Encounter (Signed)
Med filled.  

## 2013-12-26 ENCOUNTER — Encounter: Payer: Self-pay | Admitting: Family Medicine

## 2013-12-27 ENCOUNTER — Inpatient Hospital Stay (HOSPITAL_COMMUNITY)
Admission: EM | Admit: 2013-12-27 | Discharge: 2013-12-29 | DRG: 377 | Disposition: A | Discharge status: Home / Self Care | Payer: 59 | Attending: Internal Medicine | Admitting: Internal Medicine

## 2013-12-27 ENCOUNTER — Emergency Department (HOSPITAL_COMMUNITY): Payer: 59

## 2013-12-27 ENCOUNTER — Inpatient Hospital Stay (HOSPITAL_COMMUNITY): Payer: 59

## 2013-12-27 ENCOUNTER — Ambulatory Visit: Payer: 59 | Admitting: Family Medicine

## 2013-12-27 ENCOUNTER — Encounter (HOSPITAL_COMMUNITY): Payer: Self-pay | Admitting: Emergency Medicine

## 2013-12-27 ENCOUNTER — Telehealth: Payer: Self-pay | Admitting: Family Medicine

## 2013-12-27 ENCOUNTER — Telehealth: Payer: Self-pay

## 2013-12-27 DIAGNOSIS — G5760 Lesion of plantar nerve, unspecified lower limb: Secondary | ICD-10-CM | POA: Diagnosis present

## 2013-12-27 DIAGNOSIS — D500 Iron deficiency anemia secondary to blood loss (chronic): Secondary | ICD-10-CM | POA: Diagnosis present

## 2013-12-27 DIAGNOSIS — R1310 Dysphagia, unspecified: Secondary | ICD-10-CM

## 2013-12-27 DIAGNOSIS — K810 Acute cholecystitis: Secondary | ICD-10-CM | POA: Diagnosis present

## 2013-12-27 DIAGNOSIS — Z792 Long term (current) use of antibiotics: Secondary | ICD-10-CM

## 2013-12-27 DIAGNOSIS — K224 Dyskinesia of esophagus: Secondary | ICD-10-CM | POA: Diagnosis present

## 2013-12-27 DIAGNOSIS — Z888 Allergy status to other drugs, medicaments and biological substances status: Secondary | ICD-10-CM | POA: Diagnosis not present

## 2013-12-27 DIAGNOSIS — Z87440 Personal history of urinary (tract) infections: Secondary | ICD-10-CM | POA: Diagnosis not present

## 2013-12-27 DIAGNOSIS — G219 Secondary parkinsonism, unspecified: Secondary | ICD-10-CM | POA: Diagnosis present

## 2013-12-27 DIAGNOSIS — I639 Cerebral infarction, unspecified: Secondary | ICD-10-CM | POA: Diagnosis present

## 2013-12-27 DIAGNOSIS — G20 Parkinson's disease: Secondary | ICD-10-CM | POA: Diagnosis present

## 2013-12-27 DIAGNOSIS — I6529 Occlusion and stenosis of unspecified carotid artery: Secondary | ICD-10-CM | POA: Diagnosis present

## 2013-12-27 DIAGNOSIS — E669 Obesity, unspecified: Secondary | ICD-10-CM | POA: Diagnosis present

## 2013-12-27 DIAGNOSIS — Z6838 Body mass index (BMI) 38.0-38.9, adult: Secondary | ICD-10-CM | POA: Diagnosis not present

## 2013-12-27 DIAGNOSIS — G40909 Epilepsy, unspecified, not intractable, without status epilepticus: Secondary | ICD-10-CM | POA: Diagnosis present

## 2013-12-27 DIAGNOSIS — Z85841 Personal history of malignant neoplasm of brain: Secondary | ICD-10-CM

## 2013-12-27 DIAGNOSIS — Z881 Allergy status to other antibiotic agents status: Secondary | ICD-10-CM

## 2013-12-27 DIAGNOSIS — K922 Gastrointestinal hemorrhage, unspecified: Secondary | ICD-10-CM | POA: Diagnosis present

## 2013-12-27 DIAGNOSIS — Z886 Allergy status to analgesic agent status: Secondary | ICD-10-CM

## 2013-12-27 DIAGNOSIS — R1314 Dysphagia, pharyngoesophageal phase: Secondary | ICD-10-CM

## 2013-12-27 DIAGNOSIS — K921 Melena: Secondary | ICD-10-CM | POA: Diagnosis present

## 2013-12-27 DIAGNOSIS — R130 Aphagia: Secondary | ICD-10-CM | POA: Diagnosis present

## 2013-12-27 DIAGNOSIS — Z993 Dependence on wheelchair: Secondary | ICD-10-CM

## 2013-12-27 DIAGNOSIS — M797 Fibromyalgia: Secondary | ICD-10-CM | POA: Diagnosis present

## 2013-12-27 DIAGNOSIS — L299 Pruritus, unspecified: Secondary | ICD-10-CM | POA: Diagnosis present

## 2013-12-27 DIAGNOSIS — D649 Anemia, unspecified: Secondary | ICD-10-CM

## 2013-12-27 DIAGNOSIS — R059 Cough, unspecified: Secondary | ICD-10-CM

## 2013-12-27 DIAGNOSIS — K222 Esophageal obstruction: Secondary | ICD-10-CM

## 2013-12-27 DIAGNOSIS — I672 Cerebral atherosclerosis: Secondary | ICD-10-CM | POA: Diagnosis present

## 2013-12-27 DIAGNOSIS — E785 Hyperlipidemia, unspecified: Secondary | ICD-10-CM | POA: Diagnosis present

## 2013-12-27 DIAGNOSIS — D62 Acute posthemorrhagic anemia: Secondary | ICD-10-CM | POA: Diagnosis present

## 2013-12-27 DIAGNOSIS — G43909 Migraine, unspecified, not intractable, without status migrainosus: Secondary | ICD-10-CM | POA: Diagnosis present

## 2013-12-27 DIAGNOSIS — R05 Cough: Secondary | ICD-10-CM

## 2013-12-27 DIAGNOSIS — R531 Weakness: Secondary | ICD-10-CM

## 2013-12-27 LAB — CBC WITH DIFFERENTIAL/PLATELET
Basophils Absolute: 0 10*3/uL (ref 0.0–0.1)
Basophils Relative: 1 % (ref 0–1)
Eosinophils Absolute: 0.2 10*3/uL (ref 0.0–0.7)
Eosinophils Relative: 5 % (ref 0–5)
HEMATOCRIT: 19.2 % — AB (ref 36.0–46.0)
HEMOGLOBIN: 5.8 g/dL — AB (ref 12.0–15.0)
LYMPHS ABS: 1.4 10*3/uL (ref 0.7–4.0)
LYMPHS PCT: 32 % (ref 12–46)
MCH: 25.3 pg — ABNORMAL LOW (ref 26.0–34.0)
MCHC: 30.2 g/dL (ref 30.0–36.0)
MCV: 83.8 fL (ref 78.0–100.0)
MONO ABS: 0.4 10*3/uL (ref 0.1–1.0)
Monocytes Relative: 9 % (ref 3–12)
NEUTROS ABS: 2.3 10*3/uL (ref 1.7–7.7)
NEUTROS PCT: 53 % (ref 43–77)
Platelets: 230 10*3/uL (ref 150–400)
RBC: 2.29 MIL/uL — AB (ref 3.87–5.11)
RDW: 16.5 % — ABNORMAL HIGH (ref 11.5–15.5)
WBC: 4.3 10*3/uL (ref 4.0–10.5)

## 2013-12-27 LAB — BASIC METABOLIC PANEL
Anion gap: 12 (ref 5–15)
BUN: 19 mg/dL (ref 6–23)
CO2: 23 mmol/L (ref 19–32)
CREATININE: 1.57 mg/dL — AB (ref 0.50–1.10)
Calcium: 9.9 mg/dL (ref 8.4–10.5)
Chloride: 105 mEq/L (ref 96–112)
GFR calc non Af Amer: 36 mL/min — ABNORMAL LOW (ref >90)
GFR, EST AFRICAN AMERICAN: 41 mL/min — AB (ref >90)
GLUCOSE: 96 mg/dL (ref 70–99)
POTASSIUM: 3.7 mmol/L (ref 3.5–5.1)
Sodium: 140 mmol/L (ref 135–145)

## 2013-12-27 LAB — ABO/RH: ABO/RH(D): O POS

## 2013-12-27 LAB — PREPARE RBC (CROSSMATCH)

## 2013-12-27 MED ORDER — SENNOSIDES-DOCUSATE SODIUM 8.6-50 MG PO TABS: 1.0000 | ORAL_TABLET | Freq: Every evening | ORAL | Status: DC | PRN

## 2013-12-27 MED ORDER — SODIUM CHLORIDE 0.9 % IV BOLUS (SEPSIS)
500.0000 mL | Freq: Once | INTRAVENOUS | Status: AC
  Administered 2013-12-27: 500 mL via INTRAVENOUS

## 2013-12-27 MED ORDER — DULOXETINE HCL 60 MG PO CPEP
60.0000 mg | ORAL_CAPSULE | Freq: Every day | ORAL | Status: DC
  Administered 2013-12-28 – 2013-12-29 (×2): 60 mg via ORAL
  Filled 2013-12-27 (×3): qty 1

## 2013-12-27 MED ORDER — HYDROCODONE-ACETAMINOPHEN 5-325 MG PO TABS: 1.0000 | ORAL_TABLET | Freq: Four times a day (QID) | ORAL | Status: DC | PRN

## 2013-12-27 MED ORDER — PREGABALIN 75 MG PO CAPS
150.0000 mg | ORAL_CAPSULE | Freq: Two times a day (BID) | ORAL | Status: DC
Start: 2013-12-27 — End: 2013-12-29
  Administered 2013-12-28 (×2): 150 mg via ORAL
  Filled 2013-12-27 (×3): qty 2

## 2013-12-27 MED ORDER — SODIUM CHLORIDE 0.9 % IV SOLN
INTRAVENOUS | Status: DC
  Administered 2013-12-27: 20:00:00 via INTRAVENOUS
  Administered 2013-12-28: 500 mL via INTRAVENOUS

## 2013-12-27 MED ORDER — LEVETIRACETAM IN NACL 1500 MG/100ML IV SOLN
1500.0000 mg | Freq: Once | INTRAVENOUS | Status: AC
  Administered 2013-12-28: 1500 mg via INTRAVENOUS
  Filled 2013-12-27: qty 100

## 2013-12-27 MED ORDER — HYDROCORTISONE ACE-PRAMOXINE 1-1 % RE FOAM
1.0000 | Freq: Three times a day (TID) | RECTAL | Status: DC
  Administered 2013-12-27 – 2013-12-29 (×4): 1 via RECTAL
  Filled 2013-12-27 (×2): qty 10

## 2013-12-27 MED ORDER — NITROFURANTOIN MACROCRYSTAL 100 MG PO CAPS
100.0000 mg | ORAL_CAPSULE | Freq: Every day | ORAL | Status: DC
  Administered 2013-12-28 – 2013-12-29 (×2): 100 mg via ORAL
  Filled 2013-12-27 (×2): qty 1

## 2013-12-27 MED ORDER — TRAZODONE HCL 50 MG PO TABS
50.0000 mg | ORAL_TABLET | Freq: Every day | ORAL | Status: DC
  Administered 2013-12-28: 50 mg via ORAL
  Filled 2013-12-27: qty 1

## 2013-12-27 MED ORDER — SODIUM CHLORIDE 0.9 % IV SOLN: Freq: Once | INTRAVENOUS | Status: DC

## 2013-12-27 MED ORDER — ROSUVASTATIN CALCIUM 10 MG PO TABS
10.0000 mg | ORAL_TABLET | ORAL | Status: DC
  Administered 2013-12-28: 10 mg via ORAL
  Filled 2013-12-27: qty 1

## 2013-12-27 MED ORDER — FESOTERODINE FUMARATE ER 8 MG PO TB24
8.0000 mg | ORAL_TABLET | Freq: Every day | ORAL | Status: DC
  Administered 2013-12-28 – 2013-12-29 (×2): 8 mg via ORAL
  Filled 2013-12-27 (×2): qty 1

## 2013-12-27 MED ORDER — LEVETIRACETAM 750 MG PO TABS
1500.0000 mg | ORAL_TABLET | Freq: Two times a day (BID) | ORAL | Status: DC
  Administered 2013-12-28 – 2013-12-29 (×3): 1500 mg via ORAL
  Filled 2013-12-27 (×3): qty 2

## 2013-12-27 MED ORDER — PROMETHAZINE HCL 25 MG PO TABS: 25.0000 mg | ORAL_TABLET | Freq: Four times a day (QID) | ORAL | Status: DC | PRN

## 2013-12-27 MED ORDER — HYDROCORTISONE ACE-PRAMOXINE 2.5-1 % RE CREA: 1.0000 "application " | TOPICAL_CREAM | Freq: Three times a day (TID) | RECTAL | Status: DC

## 2013-12-27 MED ORDER — FENOFIBRATE 160 MG PO TABS
160.0000 mg | ORAL_TABLET | Freq: Every day | ORAL | Status: DC
  Administered 2013-12-28 – 2013-12-29 (×2): 160 mg via ORAL
  Filled 2013-12-27 (×3): qty 1

## 2013-12-27 MED ORDER — PANTOPRAZOLE SODIUM 40 MG IV SOLR
40.0000 mg | Freq: Two times a day (BID) | INTRAVENOUS | Status: DC
  Administered 2013-12-28 (×3): 40 mg via INTRAVENOUS
  Filled 2013-12-27 (×3): qty 40

## 2013-12-27 MED ORDER — SODIUM CHLORIDE 0.9 % IV SOLN
INTRAVENOUS | Status: AC
  Administered 2013-12-27: 16:00:00 via INTRAVENOUS

## 2013-12-27 MED ORDER — METHOCARBAMOL 500 MG PO TABS: 500.0000 mg | ORAL_TABLET | Freq: Every day | ORAL | Status: DC | PRN

## 2013-12-27 MED ORDER — LINACLOTIDE 145 MCG PO CAPS
145.0000 ug | ORAL_CAPSULE | Freq: Every day | ORAL | Status: DC
  Administered 2013-12-28 – 2013-12-29 (×2): 145 ug via ORAL
  Filled 2013-12-27 (×2): qty 1

## 2013-12-27 MED ORDER — PREGABALIN 75 MG PO CAPS: 150.0000 mg | ORAL_CAPSULE | Freq: Two times a day (BID) | ORAL | Status: DC

## 2013-12-27 MED ORDER — STROKE: EARLY STAGES OF RECOVERY BOOK
Freq: Once | Status: AC
  Administered 2013-12-27: 15:00:00
  Filled 2013-12-27: qty 1

## 2013-12-27 NOTE — Telephone Encounter (Signed)
Husband sent message via [employer] stating that wife has been having trouble with swallowing x 6 days. Dr. Virgil Benedict advice via [facility] was for patient to call Neuro ASAP and schedule appt, if unable to schedule appt to go ER.  Husband had scheduled an appointment with Dr. Birdie Riddle instead.  Husband was called and Dr. Virgil Benedict advice was reiterated.  Husband stated understanding and agreed to follow advice.   Appointment with Birdie Riddle today was cancelled.

## 2013-12-27 NOTE — Telephone Encounter (Signed)
Caller name:Strnad,David C Relation to pt: self  Call back number: [phone]   Reason for call:  Pt was admitted to [facility] due to her having a stroke. Husband wanted to thank you for everything you have done and wanted to inform you of wife condition.

## 2013-12-27 NOTE — ED Notes (Signed)
Pt transported to xray 

## 2013-12-27 NOTE — Progress Notes (Signed)
Pt received from ED at this time 15:42 pm with family at bedside. Pt alert, verbal with no noted distress. Pt educated and oriented to room. Safety measures in place. HOB elevated. Call bell within reach. Pt denies pain or discomfort. Will continue to monitor. Received report from nurse, Jinny Blossom.

## 2013-12-27 NOTE — ED Notes (Signed)
Pt here with family c/o dysphagia x 6 days; pt with hx of frontal lobe tumor with multiple sx; per family sent here for eval of possible stroke

## 2013-12-27 NOTE — Consult Note (Signed)
[facility] Consult: 2:35 PM 12/27/2013  LOS: 0 days    Referring Provider: Dr Cruzita Lederer  Primary Care Physician:  Annye Asa, MD Primary Gastroenterologist:  Dr. Olevia Perches     Reason for Consultation:  Anemia, dark stool.    HPI: [name] is a 58 y.o. female. S/p multiple brain surgeries as well as radiation for cancer, seizures.   Secondary Parkinson's.  She is wheelchair-bound. She takes daily antibiotics for history of recurrent UTIs.   Abnormal LFTs in 07/2013.  AST/ALT 530/345.  Alk phos 122.  t bili 1.2 at time of acute cholecystitis and GB hydrops.  S/p 08/02/13 lap chole. LFTs subsequently normalized.   Black, tarry stools for 3 weeks.  She has chronic constipation and usually has a bowel movement twice a week. This is despite using daily MiraLAX and Linzess.  The stools are generally sticky, tarry and occur no more than once on the day when she has a bowel movement. She has no nausea, queasiness, anorexia. However for 6 days, she has had dysphagia. Mostly this is to solids and she is able to swallow mashed and liquid foods. However, even with the soft foods she may gag but they eventually will go down. She has never had problems with anemia nor with swallowing. Hgb baseline is 9-11.  Today it is 5.8.  PRBCs times 2 have been ordered. On December 3 her BUN was 42, today it is 19 Her pro time/INR are normal. She is using omeprazole 20 mg daily. Mobic on her med list is used at most 2-3 times monthly and she has not used any in at least 3 weeks.   Her husband is well informed as to her healthcare, he is her primary caregiver. He denies her having had nose bleeds, large hematomas or any other possible sources of blood loss.  01/2010 screening colonoscopy: normal. 10 year follow up schedule.   No previous  EGDs.     Past Medical History  Diagnosis Date  . Brain cancer     Frontal lobe, 1993 and 2005  . Migraines   . Morton neuroma   . Seizures   . Migraine   . Fibromyalgia   . Right fibular fracture 07/12/2011  . 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 Anemia 03/06/2013  . Secondary Parkinson disease     Past Surgical History  Procedure Laterality Date  . Appendectomy  1976  . Excision morton's neuroma      right foot  . Brain cancer      1993 and 2005  . Orif ankle fracture  07/12/2011    Procedure: OPEN REDUCTION INTERNAL FIXATION (ORIF) ANKLE FRACTURE;  Surgeon: Johnny Bridge, MD;  Location: [place];  Service: Orthopedics;  Laterality: Right;  . Cholecystectomy  08/02/2013    Procedure: LAPAROSCOPIC CHOLECYSTECTOMY;  Surgeon: Gwenyth Ober, MD;  Location: [place];  Service: General;;    Prior to Admission medications   Medication Sig Start Date End  Date Taking? Authorizing Provider  Calcium Citrate-Vitamin D (CITRACAL + D PO) Take 1 tablet by mouth daily. CA $RemoveB'1200mg'CDxsRzHD$ -Vitamin D 1000iu   Yes Historical Provider, MD  DULoxetine (CYMBALTA) 60 MG capsule Take 60 mg by mouth daily.   Yes Historical Provider, MD  fenofibrate 160 MG tablet Take 160 mg by mouth daily.   Yes Historical Provider, MD  fesoterodine (TOVIAZ) 8 MG TB24 tablet Take 8 mg by mouth daily.   Yes Historical Provider, MD  fish oil-omega-3 fatty acids 1000 MG capsule Take 1 g by mouth 2 (two) times daily.     Yes Historical Provider, MD  hydrocortisone-pramoxine (ANALPRAM HC) 2.5-1 % rectal cream Place 1 application rectally 3 (three) times daily. 12/08/13  Yes Midge Minium, MD  levETIRAcetam (KEPPRA) 500 MG tablet Take 1,500 mg by mouth 2 (two) times daily.    Yes Historical Provider, MD  Linaclotide Rolan Lipa) 145 MCG CAPS capsule Take 1 capsule (145 mcg total) by mouth daily. 06/07/13  Yes Midge Minium, MD  losartan-hydrochlorothiazide  (HYZAAR) 50-12.5 MG per tablet Take 1 tablet by mouth daily. 08/31/13  Yes Midge Minium, MD  LYRICA 150 MG capsule TAKE ONE CAPSULE BY MOUTH TWICE A DAY 11/14/13  Yes Midge Minium, MD  meloxicam (MOBIC) 15 MG tablet Take 15 mg by mouth daily.   Yes Historical Provider, MD  methocarbamol (ROBAXIN) 500 MG tablet Take 500 mg by mouth daily as needed for muscle spasms.   Yes Historical Provider, MD  nitrofurantoin (MACRODANTIN) 50 MG capsule Take 100 mg by mouth daily.   Yes Historical Provider, MD  omeprazole (PRILOSEC) 20 MG capsule TAKE 1 CAPSULE BY MOUTH DAILY 12/05/13  Yes Midge Minium, MD  promethazine (PHENERGAN) 25 MG tablet Take 25 mg by mouth every 6 (six) hours as needed for nausea or vomiting.   Yes Historical Provider, MD  rosuvastatin (CRESTOR) 10 MG tablet Take 10 mg by mouth every Monday, Wednesday, and Friday. Mon, Wed, Friday   Yes Historical Provider, MD  traZODone (DESYREL) 50 MG tablet Take 1 tablet (50 mg total) by mouth at bedtime. 12/13/13  Yes Midge Minium, MD  triamterene (DYRENIUM) 100 MG capsule Take 100 mg by mouth daily.   Yes Historical Provider, MD  HYDROcodone-acetaminophen (NORCO/VICODIN) 5-325 MG per tablet Take 1-2 tablets by mouth every 6 (six) hours as needed for moderate pain. Patient not taking: Reported on 12/27/2013 10/10/13   Meriam Sprague Saguier, PA-C    Scheduled Meds: .  stroke: mapping our early stages of recovery book   Does not apply Once  . pantoprazole (PROTONIX) IV  40 mg Intravenous Q12H   Infusions: . sodium chloride     PRN Meds: senna-docusate   Allergies as of 12/27/2013 - Review Complete 12/27/2013  Allergen Reaction Noted  . Versed [midazolam] Other (See Comments) and Anaphylaxis 07/13/2011  . Ciprofloxacin  09/18/2008  . Cyclobenzaprine Itching 03/10/2013  . Methocarbamol    . Metronidazole Other (See Comments) 01/11/2010  . Penicillins Rash 09/18/2008    Family History  Problem Relation Age of Onset  .  Diabetes Mother   . Diabetes Brother   . Hypertension Mother   . Hypertension Father     History   Social History  . Marital Status: Married    Spouse Name: N/A    Number of Children: N/A  . Years of Education: N/A   Occupational History  . Not on file.   Social History Main Topics  . Smoking status: Never  Smoker   . Smokeless tobacco: Never Used  . Alcohol Use: No  . Drug Use: No  . Sexual Activity: Not Currently   Other Topics Concern  . Not on file   Social History Narrative    REVIEW OF SYSTEMS: Constitutional:  Weight is stable. Strength does not seem to have been affected by her anemia. ENT:  No nose bleeds Pulm:  No cough, no dyspnea. CV:  No palpitations, no LE edema. No chest pain. GU:  No hematuria, no frequency.  Plus incontinence GI:  Per history of present illness Heme:  No problems with anemia in the past.   Transfusions:  Never transfused before. Neuro:  No headaches, no peripheral tingling or numbness Derm:  No itching, no rash or sores.  Endocrine:  No sweats or chills.  No polyuria or dysuria Immunization:  Vaccinations reviewed. Her zoster, influenza vaccinations are up-to-date. Travel:  None beyond local counties in last few months.    PHYSICAL EXAM: Vital signs in last 24 hours: Filed Vitals:   12/27/13 1427  BP: 105/69  Pulse: 81  Temp:   Resp: 20   Wt Readings from Last 3 Encounters:  08/31/13 204 lb 2 oz (92.59 kg)  08/12/13 202 lb (91.627 kg)  07/31/13 194 lb 14.4 oz (88.406 kg)    General: Pleasant, pale, non-ill appearing WF. Appears her stated age. Head:  No swelling, no facial asymmetry.  Eyes:  Conjunctiva pale, no scleral icterus. Ears:  No obvious hearing deficits.  Nose:  No congestion or nasal discharge Mouth:  Upper dentures in place. Mucosa is dry but clear and without exudate. Neck:  No JVD, no TMG, no masses. Lungs:  Lungs are clear to auscultation and percussion bilaterally. No cough. No labored  breathing Heart: RRR. No MRG. S1/S2 audible Abdomen:  Soft, active bowel sounds. NT, ND.  No HSM. No bruits. No hernias.   Rectal: Deferred.   Musc/Skeltl: No joint erythema, contractures or gross deformities. Extremities:  No pedal edema.  Neurologic:  Fluid though limited speech. No aphasia. Follows 3 step commands without difficulty.  Appropriately oriented 3. Alert and engaged. However many of the detailed questions were answered by her husband Skin:  No telangiectasia, rashes, sores Tattoos:  None Nodes:  No cervical adenopathy   Psych:  Pleasant, relaxed.  Intake/Output from previous day:   Intake/Output this shift:    LAB RESULTS:  Recent Labs  12/27/13 1113  WBC 4.3  HGB 5.8*  HCT 19.2*  PLT 230   MCV    83 BMET Lab Results  Component Value Date   NA 140 12/27/2013   NA 144 12/08/2013   NA 140 08/31/2013   K 3.7 12/27/2013   K 4.4 12/08/2013   K 4.4 08/31/2013   CL 105 12/27/2013   CL 111 12/08/2013   CL 108 08/31/2013   CO2 23 12/27/2013   CO2 15* 12/08/2013   CO2 24 08/31/2013   GLUCOSE 96 12/27/2013   GLUCOSE 89 12/08/2013   GLUCOSE 83 08/31/2013   BUN 19 12/27/2013   BUN 42* 12/08/2013   BUN 23 08/31/2013   CREATININE 1.57* 12/27/2013   CREATININE 1.6* 12/08/2013   CREATININE 1.0 08/31/2013   CALCIUM 9.9 12/27/2013   CALCIUM 9.6 12/08/2013   CALCIUM 9.5 08/31/2013   LFT No results for input(s): PROT, ALBUMIN, AST, ALT, ALKPHOS, BILITOT, BILIDIR, IBILI in the last 72 hours. PT/INR Lab Results  Component Value Date   INR 1.23 08/01/2013   INR 1.16 03/07/2013  INR 1.15 03/06/2013   Hepatitis Panel No results for input(s): HEPBSAG, HCVAB, HEPAIGM, HEPBIGM in the last 72 hours. C-Diff No components found for: CDIFF Lipase     Component Value Date/Time   LIPASE 18 07/31/2013 1000    Drugs of Abuse  No results found for: LABOPIA, COCAINSCRNUR, LABBENZ, AMPHETMU, THCU, LABBARB   RADIOLOGY STUDIES: Dg Chest 2 View  12/27/2013    CLINICAL DATA:  Cough.  Dysphasia and weakness.  EXAM: CHEST  2 VIEW  COMPARISON:  07/31/2013  FINDINGS: Lung volumes are low. Minimal left greater than right basilar atelectasis. The cardiomediastinal contours are normal. Pulmonary vasculature is normal. No consolidation, pleural effusion, or pneumothorax. No acute osseous abnormalities are seen.  IMPRESSION: Hypoventilatory chest with mild bibasilar atelectasis.   Electronically Signed   By: Jeb Levering M.D.   On: 12/27/2013 13:05   Mr Brain Wo Contrast  12/27/2013   CLINICAL DATA:  58 year old female with history of left frontal lobe tumor post resection. Migraine headaches. Presenting with dysphagia weakness for the past 6 days. Obesity. Hyperlipidemia. Initial encounter.  EXAM: MRI HEAD WITHOUT CONTRAST  TECHNIQUE: Multiplanar, multiecho pulse sequences of the brain and surrounding structures were obtained without intravenous contrast.  COMPARISON:  08/23/2005.  FINDINGS: Suggestion of tiny acute nonhemorrhagic infarct left periatrial region.  Post left frontal lobe craniotomy and resection of portion of the anterior left frontal lobe. When compared to the most recent exam available from 2007, there has been a change in appearance of the resection cavity with increase in lobulated septated cystic component, further dilation of the left lateral ventricle and increase in white matter changes throughout the hemispheres more notable on left. It is possible this represents post therapy changes however, correlation with more recent MR recommended to exclude the possibility of recurrent tumor.  Progressive atrophy.  Tiny regions of blood breakdown products probably postoperative/post therapy in origin.  Major intracranial vascular structures are patent.  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  Cervical medullary junction unremarkable.  Partially empty sella once again noted and felt to be an incidental finding.  Orbital structures unremarkable.  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  Post left frontal lobe craniotomy and resection of portion of the anterior left frontal lobe. When compared to the most recent exam available from 2007, there has been a change in appearance of the resection cavity with increase in lobulated septated cystic component, further dilation of the left lateral ventricle and increase in white matter changes throughout the hemispheres more notable on left. It is possible this represents post therapy changes however, correlation with more recent MR recommended to exclude the possibility of recurrent tumor.  Progressive atrophy.   Electronically Signed   By: Chauncey Cruel M.D.   On: 12/27/2013 13:18    ENDOSCOPIC STUDIES: Colonoscopy 2012 as per history of present illness.  IMPRESSION:   *  Acute anemia, MCV normal.  Black, tarry stools.  Rule out ulcer disease. Rule out AVM.  *  Recent onset, acute dysphagia. The sudden acuity of this problem weighs against esophageal dysmotility as well as stricture but these are part of the differential.  She is not diabetic and her glucoses are within normal range so the likelihood of Candida esophagitis is not high.  However she is on chronic suppressive antibiotics which can lead to Candida infection.  *  Brain cancer and seizures. Current MRI showing tiny left, periatrial, nonhemorrhagic infarct.  Per neuro this is an incidental finding and is not an  explanation for her new onset dysphagia.     PLAN:     *  Patient is to be transfused with 2 units packed red cells today.  We will arrange EGD tomorrow afternoon. Details of this, and the possibility of esophageal dilatation should we encounter a stricture, were discussed with the patient and her husband. They are hopeful that her anemia and dysphagia can be sorted out so that she might get to go home for Christmas in 2 days.   *  I went ahead and ordered a dysphagia 1 diet with thin liquids. The admitting  physician has already ordered speech language study.   Azucena Freed  12/27/2013, 2:35 PM Pager: [phone]  GI Attending Note   Chart was reviewed and patient was examined. X-rays and lab were reviewed.    I agree with management and plans. Sudden onset of dysphagia.  Dysphagia from strictures and dysmotility are usually more gradual in onset though should be r/oed, as well as candida esophagitis.  Pt has had a GI bleed.  She apparantly takes excedrin regularly for migraines.  Suspect PUD despite PPI use.  Plans as above.  Sandy Salaam. Deatra Ina, M.D., [facility] Cell [phone]

## 2013-12-27 NOTE — ED Notes (Signed)
Lab called critical HGB 5.8 reported to EDP.

## 2013-12-27 NOTE — ED Notes (Signed)
EDP at bedside  

## 2013-12-27 NOTE — Telephone Encounter (Signed)
I appreciate his call and please let him know if we can be of any additional help, please just let us know

## 2013-12-27 NOTE — Consult Note (Signed)
Referring Physician: Alvino Chapel    Chief Complaint: stroke  HPI:                                                                                                                                         [name] is an 58 y.o. female PMH is significant for a left frontal low grade glioma resection in 1993. She developed seizures in 2000 and subsequently had a left temporal lobe resection in 2004. In 2006, she had full radiation treatment. Patient has been diagnosed with secondary parkinson's and is currently scheduled for DAT scan LP for possible hydrocephalus.  Per husband over the past three week she has been having black tarry stools and over the past 6 days he has noted she has been having difficulty swallowing solid foods.  She is able to take liquids, mashed food and mashed potatoes with no difficulty. Patient denies any pain or sensation as if food is stuck.  She just gags on food. She was recently seen by her neurologist Georgette Shell of [facility] on 12/22/2013 and at that time had no complaints of swallowing difficulty but husband states she had not mentioned it to him. Patient has no other complaints of weakness, numbness, HA, visual difficulty.   Neurology was asked to evaluate patient due to tiny acute nonhemorrhagic infarct left periatrial region  Date last known well: Date: 12/21/2013 Time last known well: Unable to determine tPA Given: No: out of window  Past Medical History  Diagnosis Date  . Brain cancer     Frontal lobe, 1993 and 2005  . Migraines   . Morton neuroma   . Seizures   . Migraine   . Fibromyalgia   . Right fibular fracture 07/12/2011  . 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 Anemia 03/06/2013  . Secondary Parkinson disease     Past Surgical History  Procedure Laterality Date  . Appendectomy  1976  . Excision morton's neuroma      right foot  . Brain  cancer      1993 and 2005  . Orif ankle fracture  07/12/2011    Procedure: OPEN REDUCTION INTERNAL FIXATION (ORIF) ANKLE FRACTURE;  Surgeon: Johnny Bridge, MD;  Location: [place] [facility];  Service: Orthopedics;  Laterality: Right;  . Cholecystectomy  08/02/2013    Procedure: LAPAROSCOPIC CHOLECYSTECTOMY;  Surgeon: Gwenyth Ober, MD;  Location: [facility] OR;  Service: General;;    Family History  Problem Relation Age of Onset  . Diabetes Mother   . Diabetes Brother   . Hypertension Mother   . Hypertension Father    Social History:  reports that she has never smoked. She has never used smokeless tobacco. She reports that she does not drink alcohol or use illicit drugs.  Allergies:  Allergies  Allergen Reactions  . Versed [Midazolam] Other (See Comments) and Anaphylaxis    Severe respiratory depression, had to be masked in preop holding.  . Ciprofloxacin     REACTION: NAUSEA AND VOMITING  . Cyclobenzaprine Itching    Other reaction(s): Itching / Pruritis (ALLERGY/intolerance)  . Methocarbamol     REACTION: itching Patient currently tolerating it as its on her home med list.  . Metronidazole Other (See Comments)    Unknown   . Penicillins Rash    REACTION: RASH    Medications:                                                                                                                           Current Facility-Administered Medications  Medication Dose Route Frequency Provider Last Rate Last Dose  .  stroke: mapping our early stages of recovery book   Does not apply Once Costin Karlyne Greenspan, MD      . senna-docusate (Senokot-S) tablet 1 tablet  1 tablet Oral QHS PRN Costin Karlyne Greenspan, MD       Current Outpatient Prescriptions  Medication Sig Dispense Refill  . Calcium Citrate-Vitamin D (CITRACAL + D PO) Take 1 tablet by mouth daily. CA 1200mg -Vitamin D 1000iu    . DULoxetine (CYMBALTA) 60 MG capsule Take 60 mg by mouth daily.    . fenofibrate 160 MG tablet Take 160 mg by mouth daily.    .  fesoterodine (TOVIAZ) 8 MG TB24 tablet Take 8 mg by mouth daily.    . fish oil-omega-3 fatty acids 1000 MG capsule Take 1 g by mouth 2 (two) times daily.      . hydrocortisone-pramoxine (ANALPRAM HC) 2.5-1 % rectal cream Place 1 application rectally 3 (three) times daily. 30 g 0  . levETIRAcetam (KEPPRA) 500 MG tablet Take 1,500 mg by mouth 2 (two) times daily.     . Linaclotide (LINZESS) 145 MCG CAPS capsule Take 1 capsule (145 mcg total) by mouth daily. 30 capsule 3  . losartan-hydrochlorothiazide (HYZAAR) 50-12.5 MG per tablet Take 1 tablet by mouth daily. 30 tablet 6  . LYRICA 150 MG capsule TAKE ONE CAPSULE BY MOUTH TWICE A DAY 60 capsule 3  . meloxicam (MOBIC) 15 MG tablet Take 15 mg by mouth daily.    . methocarbamol (ROBAXIN) 500 MG tablet Take 500 mg by mouth daily as needed for muscle spasms.    . nitrofurantoin (MACRODANTIN) 50 MG capsule Take 100 mg by mouth daily.    Marland Kitchen omeprazole (PRILOSEC) 20 MG capsule TAKE 1 CAPSULE BY MOUTH DAILY 30 capsule 3  . promethazine (PHENERGAN) 25 MG tablet Take 25 mg by mouth every 6 (six) hours as needed for nausea or vomiting.    . rosuvastatin (CRESTOR) 10 MG tablet Take 10 mg by mouth every Monday, Wednesday, and Friday. Mon, Wed, Friday    . traZODone (DESYREL) 50 MG tablet Take 1 tablet (50 mg total) by mouth at bedtime. 30 tablet 3  . triamterene (  DYRENIUM) 100 MG capsule Take 100 mg by mouth daily.    Marland Kitchen HYDROcodone-acetaminophen (NORCO/VICODIN) 5-325 MG per tablet Take 1-2 tablets by mouth every 6 (six) hours as needed for moderate pain. (Patient not taking: Reported on 12/27/2013) 30 tablet 0  . [DISCONTINUED] gabapentin (NEURONTIN) 100 MG capsule        ROS:                                                                                                                                       History obtained from the patient and husband  General ROS: negative for - chills, fatigue, fever, night sweats, weight gain or weight  loss Psychological ROS: negative for - behavioral disorder, hallucinations, memory difficulties, mood swings or suicidal ideation Ophthalmic ROS: negative for - blurry vision, double vision, eye pain or loss of vision ENT ROS: negative for - epistaxis, nasal discharge, oral lesions, sore throat, tinnitus or vertigo Allergy and Immunology ROS: negative for - hives or itchy/watery eyes Hematological and Lymphatic ROS: negative for - bleeding problems, bruising or swollen lymph nodes Endocrine ROS: negative for - galactorrhea, hair pattern changes, polydipsia/polyuria or temperature intolerance Respiratory ROS: negative for - cough, hemoptysis, shortness of breath or wheezing Cardiovascular ROS: negative for - chest pain, dyspnea on exertion, edema or irregular heartbeat Gastrointestinal ROS: negative for - abdominal pain, diarrhea, hematemesis, nausea/vomiting or stool incontinence Genito-Urinary ROS: negative for - dysuria, hematuria, incontinence or urinary frequency/urgency Musculoskeletal ROS: negative for - joint swelling or muscular weakness Neurological ROS: as noted in HPI Dermatological ROS: negative for rash and skin lesion changes  Neurologic Examination:                                                                                                      Blood pressure 104/48, pulse 84, temperature 97.7 F (36.5 C), temperature source Oral, resp. rate 18, SpO2 97 %.  HEENT-  Normocephalic, no lesions, without obvious abnormality.  Normal external eye and conjunctiva.  Normal TM's bilaterally.  Normal auditory canals and external ears. Normal external nose, mucus membranes and septum.  Normal pharynx. Cardiovascular- S1, S2 normal, pulses palpable throughout   Lungs- chest clear, no wheezing, rales, normal symmetric air entry Abdomen- normal findings: bowel sounds normal Extremities- no edema Lymph-no adenopathy palpable Musculoskeletal-no muscular tenderness  noted Skin-W/D/I  Neurological Examination Mental Status: Alert, oriented, thought content appropriate.  Speech fluent without evidence of aphasia.  Able to follow 3 step commands without difficulty. Cranial Nerves: II:  Discs flat bilaterally; Visual fields grossly normal, pupils equal, round, reactive to light and accommodation III,IV, VI: ptosis not present, extra-ocular motions intact bilaterally V,VII: smile symmetric, facial light touch sensation normal bilaterally VIII: hearing normal bilaterally IX,X: gag reflex present XI: bilateral shoulder shrug XII: midline tongue extension Motor: Right : Upper extremity   5/5    Left:     Upper extremity   5/5  Lower extremity   4+/5    Lower extremity   4+/5 Tone and bulk:normal tone throughout; no atrophy noted Sensory: Pinprick and light touch intact throughout, bilaterally Deep Tendon Reflexes:  3+ throughout with sustained clonus on the right ankle and 4 beats clonus on the left ankle.  Plantars: Right: downgoing   Left: downgoing Cerebellar: normal finger-to-nose,and normal heel-to-shin test Gait: not tested due to fall risk.        Lab Results: Basic Metabolic Panel:  Recent Labs Lab 12/27/13 1113  NA 140  K 3.7  CL 105  CO2 23  GLUCOSE 96  BUN 19  CREATININE 1.57*  CALCIUM 9.9    Liver Function Tests: No results for input(s): AST, ALT, ALKPHOS, BILITOT, PROT, ALBUMIN in the last 168 hours. No results for input(s): LIPASE, AMYLASE in the last 168 hours. No results for input(s): AMMONIA in the last 168 hours.  CBC:  Recent Labs Lab 12/27/13 1113  WBC 4.3  NEUTROABS 2.3  HGB 5.8*  HCT 19.2*  MCV 83.8  PLT 230    Cardiac Enzymes: No results for input(s): CKTOTAL, CKMB, CKMBINDEX, TROPONINI in the last 168 hours.  Lipid Panel: No results for input(s): CHOL, TRIG, HDL, CHOLHDL, VLDL, LDLCALC in the last 168 hours.  CBG: No results for input(s): GLUCAP in the last 168 hours.  Microbiology: Results  for orders placed or performed during the hospital encounter of 07/31/13  Urine culture     Status: None   Collection Time: 07/31/13 10:50 AM  Result Value Ref Range Status   Specimen Description URINE, CATHETERIZED  Final   Special Requests NONE  Final   Culture  Setup Time   Final    07/31/2013 23:03 Performed at [facility]   Final    >=100,000 COLONIES/ML Performed at [place] OXYTOCA Performed at [facility]   Report Status 08/02/2013 FINAL  Final   Organism ID, Bacteria KLEBSIELLA OXYTOCA  Final      Susceptibility   Klebsiella oxytoca - MIC*    AMPICILLIN >=32 RESISTANT Resistant     CEFAZOLIN <=4 SENSITIVE Sensitive     CEFTRIAXONE <=1 SENSITIVE Sensitive     CIPROFLOXACIN <=0.25 SENSITIVE Sensitive     GENTAMICIN <=1 SENSITIVE Sensitive     LEVOFLOXACIN <=0.12 SENSITIVE Sensitive     NITROFURANTOIN <=16 SENSITIVE Sensitive     TOBRAMYCIN <=1 SENSITIVE Sensitive     TRIMETH/SULFA <=20 SENSITIVE Sensitive     PIP/TAZO <=4 SENSITIVE Sensitive     * KLEBSIELLA OXYTOCA  Surgical pcr screen     Status: None   Collection Time: 08/01/13 10:38 PM  Result Value Ref Range Status   MRSA, PCR NEGATIVE NEGATIVE Final   Staphylococcus aureus NEGATIVE NEGATIVE Final    Comment:        The Xpert SA Assay (FDA approved for NASAL specimens in patients over 92 years of age), is one component of a comprehensive surveillance program.  Test performance has been validated by [employer]  for patients greater than or equal to 79 year old. It is not intended to diagnose infection nor to guide or monitor treatment.    Coagulation Studies: No results for input(s): LABPROT, INR in the last 72 hours.  Imaging: Dg Chest 2 View  12/27/2013   CLINICAL DATA:  Cough.  Dysphasia and weakness.  EXAM: CHEST  2 VIEW  COMPARISON:  07/31/2013  FINDINGS: Lung volumes are low. Minimal left greater than right basilar  atelectasis. The cardiomediastinal contours are normal. Pulmonary vasculature is normal. No consolidation, pleural effusion, or pneumothorax. No acute osseous abnormalities are seen.  IMPRESSION: Hypoventilatory chest with mild bibasilar atelectasis.   Electronically Signed   By: Jeb Levering M.D.   On: 12/27/2013 13:05   Mr Brain Wo Contrast  12/27/2013   CLINICAL DATA:  58 year old female with history of left frontal lobe tumor post resection. Migraine headaches. Presenting with dysphagia weakness for the past 6 days. Obesity. Hyperlipidemia. Initial encounter.  EXAM: MRI HEAD WITHOUT CONTRAST  TECHNIQUE: Multiplanar, multiecho pulse sequences of the brain and surrounding structures were obtained without intravenous contrast.  COMPARISON:  08/23/2005.  FINDINGS: Suggestion of tiny acute nonhemorrhagic infarct left periatrial region.  Post left frontal lobe craniotomy and resection of portion of the anterior left frontal lobe. When compared to the most recent exam available from 2007, there has been a change in appearance of the resection cavity with increase in lobulated septated cystic component, further dilation of the left lateral ventricle and increase in white matter changes throughout the hemispheres more notable on left. It is possible this represents post therapy changes however, correlation with more recent MR recommended to exclude the possibility of recurrent tumor.  Progressive atrophy.  Tiny regions of blood breakdown products probably postoperative/post therapy in origin.  Major intracranial vascular structures are patent.  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  Cervical medullary junction unremarkable.  Partially empty sella once again noted and felt to be an incidental finding.  Orbital structures unremarkable.  IMPRESSION: Suggestion of tiny acute nonhemorrhagic infarct left periatrial region.  Post left frontal lobe craniotomy and resection of portion of the anterior left frontal lobe. When compared to  the most recent exam available from 2007, there has been a change in appearance of the resection cavity with increase in lobulated septated cystic component, further dilation of the left lateral ventricle and increase in white matter changes throughout the hemispheres more notable on left. It is possible this represents post therapy changes however, correlation with more recent MR recommended to exclude the possibility of recurrent tumor.  Progressive atrophy.   Electronically Signed   By: Chauncey Cruel M.D.   On: 12/27/2013 13:18       Assessment and plan discussed with with attending physician and they are in agreement.    Etta Quill PA-C [facility] [phone]  12/27/2013, 2:17 PM   Assessment: 58 y.o. 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 I feel that it be unusual for this to cause dysphagia and agree with GI workup. I'm not certain if the stroke is symptomatic at all, but would favor further workup for risk factor modification.  Stroke Risk Factors - none  1. HgbA1c, fasting lipid panel 2. MRA  of the brain without contrast 3. Frequent neuro checks 4. Echocardiogram 5. Carotid dopplers 6. Prophylactic therapy-Antiplatelet med: Once cleared by GI. 7. Risk factor modification 8. Telemetry monitoring 9. PT consult, OT consult, Speech consult 10. Continue Keppra 1500 mg twice a day for seizure disorder  Roland Rack, MD [facility] [phone]  If 7pm- 7am, please page neurology on call as listed in [place].

## 2013-12-27 NOTE — Progress Notes (Signed)
Pt receiving blood transfusion at this time. No noted distress or reactions noted. Pt denies discomfort. Tolerating well.   Vital signs stable. Will continue to monitor.

## 2013-12-27 NOTE — ED Provider Notes (Signed)
CSN: [id number]     Arrival date & time 12/27/13  1033 History   First MD Initiated Contact with Patient 12/27/13 1051     Chief Complaint  Patient presents with  . Dysphagia     (Consider location/radiation/quality/duration/timing/severity/associated sxs/prior Treatment) The history is provided by the patient.   patient was sent in by her primary care doctor since she's been having 6 days of difficulty swallowing. She's been seen for generalized weakness by her neurologist at [facility]. There was talk of parkinsonian disease. Reportedly will have a lumbar puncture. She's been eating less over the last 6 days and worried that she may choke on food. His been feeling somewhat weak all over. Upon further questioning states that she has had black stools also for the last 3 weeks. It is foul-smelling and is like tar. No chest pain. No cough. She's had previous brain tumor.  Past Medical History  Diagnosis Date  . Brain cancer     Frontal lobe, 1993 and 2005  . Migraines   . Morton neuroma   . Seizures   . Migraine   . Fibromyalgia   . Right fibular fracture 07/12/2011  . 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 Anemia 03/06/2013  . Secondary Parkinson disease    Past Surgical History  Procedure Laterality Date  . Appendectomy  1976  . Excision morton's neuroma      right foot  . Brain cancer      1993 and 2005  . Orif ankle fracture  07/12/2011    Procedure: OPEN REDUCTION INTERNAL FIXATION (ORIF) ANKLE FRACTURE;  Surgeon: Johnny Bridge, MD;  Location: [facility];  Service: Orthopedics;  Laterality: Right;  . Cholecystectomy  08/02/2013    Procedure: LAPAROSCOPIC CHOLECYSTECTOMY;  Surgeon: Gwenyth Ober, MD;  Location: [facility] OR;  Service: General;;   Family History  Problem Relation Age of Onset  . Diabetes Mother   . Diabetes Brother   . Hypertension Mother   . Hypertension Father    History  Substance Use  Topics  . Smoking status: Never Smoker   . Smokeless tobacco: Never Used  . Alcohol Use: No   OB History    No data available     Review of Systems  Constitutional: Positive for fatigue. Negative for activity change and appetite change.  HENT: Positive for trouble swallowing.   Eyes: Negative for pain.  Respiratory: Negative for chest tightness and shortness of breath.   Cardiovascular: Negative for chest pain and leg swelling.  Gastrointestinal: Positive for blood in stool. Negative for nausea, vomiting, abdominal pain and diarrhea.  Genitourinary: Negative for flank pain.  Musculoskeletal: Negative for back pain and neck stiffness.  Skin: Positive for pallor. Negative for rash.  Neurological: Negative for weakness, numbness and headaches.  Psychiatric/Behavioral: Negative for behavioral problems.      Allergies  Versed; Ciprofloxacin; Cyclobenzaprine; Methocarbamol; Metronidazole; and Penicillins  Home Medications   Prior to Admission medications   Medication Sig Start Date End Date Taking? Authorizing Provider  Calcium Citrate-Vitamin D (CITRACAL + D PO) Take 1 tablet by mouth daily. CA 1200mg -Vitamin D 1000iu   Yes Historical Provider, MD  DULoxetine (CYMBALTA) 60 MG capsule Take 60 mg by mouth daily.   Yes Historical Provider, MD  fenofibrate 160 MG tablet Take 160 mg by mouth daily.   Yes Historical Provider, MD  fesoterodine (TOVIAZ) 8 MG TB24 tablet Take 8  mg by mouth daily.   Yes Historical Provider, MD  fish oil-omega-3 fatty acids 1000 MG capsule Take 1 g by mouth 2 (two) times daily.     Yes Historical Provider, MD  hydrocortisone-pramoxine (ANALPRAM HC) 2.5-1 % rectal cream Place 1 application rectally 3 (three) times daily. 12/08/13  Yes Midge Minium, MD  levETIRAcetam (KEPPRA) 500 MG tablet Take 1,500 mg by mouth 2 (two) times daily.    Yes Historical Provider, MD  Linaclotide Rolan Lipa) 145 MCG CAPS capsule Take 1 capsule (145 mcg total) by mouth daily.  06/07/13  Yes Midge Minium, MD  losartan-hydrochlorothiazide (HYZAAR) 50-12.5 MG per tablet Take 1 tablet by mouth daily. 08/31/13  Yes Midge Minium, MD  LYRICA 150 MG capsule TAKE ONE CAPSULE BY MOUTH TWICE A DAY 11/14/13  Yes Midge Minium, MD  meloxicam (MOBIC) 15 MG tablet Take 15 mg by mouth daily.   Yes Historical Provider, MD  methocarbamol (ROBAXIN) 500 MG tablet Take 500 mg by mouth daily as needed for muscle spasms.   Yes Historical Provider, MD  nitrofurantoin (MACRODANTIN) 50 MG capsule Take 100 mg by mouth daily.   Yes Historical Provider, MD  omeprazole (PRILOSEC) 20 MG capsule TAKE 1 CAPSULE BY MOUTH DAILY 12/05/13  Yes Midge Minium, MD  promethazine (PHENERGAN) 25 MG tablet Take 25 mg by mouth every 6 (six) hours as needed for nausea or vomiting.   Yes Historical Provider, MD  rosuvastatin (CRESTOR) 10 MG tablet Take 10 mg by mouth every Monday, Wednesday, and Friday. Mon, Wed, Friday   Yes Historical Provider, MD  traZODone (DESYREL) 50 MG tablet Take 1 tablet (50 mg total) by mouth at bedtime. 12/13/13  Yes Midge Minium, MD  triamterene (DYRENIUM) 100 MG capsule Take 100 mg by mouth daily.   Yes Historical Provider, MD  HYDROcodone-acetaminophen (NORCO/VICODIN) 5-325 MG per tablet Take 1-2 tablets by mouth every 6 (six) hours as needed for moderate pain. Patient not taking: Reported on 12/27/2013 10/10/13   Meriam Sprague Saguier, PA-C   BP 110/46 mmHg  Pulse 86  Temp(Src) 97.7 F (36.5 C) (Oral)  Resp 20  SpO2 99% Physical Exam  Constitutional: She is oriented to person, place, and time. She appears well-developed and well-nourished.  HENT:  Head: Normocephalic and atraumatic.  Eyes: EOM are normal. Pupils are equal, round, and reactive to light.  Neck: Normal range of motion. Neck supple.  Cardiovascular: Normal rate, regular rhythm and normal heart sounds.   No murmur heard. Pulmonary/Chest: Effort normal and breath sounds normal. No respiratory  distress. She has no wheezes. She has no rales.  Abdominal: Soft. Bowel sounds are normal. She exhibits no distension. There is no tenderness. There is no rebound and no guarding.  Musculoskeletal: Normal range of motion.  Neurological: She is alert and oriented to person, place, and time. No cranial nerve deficit.  Skin: Skin is warm and dry. Rash noted.  Patient is very pale  Psychiatric: She has a normal mood and affect. Her speech is normal.  Nursing note and vitals reviewed.   ED Course  Procedures (including critical care time) Labs Review Labs Reviewed  CBC WITH DIFFERENTIAL - Abnormal; Notable for the following:    RBC 2.29 (*)    Hemoglobin 5.8 (*)    HCT 19.2 (*)    MCH 25.3 (*)    RDW 16.5 (*)    All other components within normal limits  BASIC METABOLIC PANEL - Abnormal; Notable for the following:  Creatinine, Ser 1.57 (*)    GFR calc non Af Amer 36 (*)    GFR calc Af Amer 41 (*)    All other components within normal limits  OCCULT BLOOD X 1 CARD TO LAB, STOOL  PREPARE RBC (CROSSMATCH)  TYPE AND SCREEN    Imaging Review Dg Chest 2 View  12/27/2013   CLINICAL DATA:  Cough.  Dysphasia and weakness.  EXAM: CHEST  2 VIEW  COMPARISON:  07/31/2013  FINDINGS: Lung volumes are low. Minimal left greater than right basilar atelectasis. The cardiomediastinal contours are normal. Pulmonary vasculature is normal. No consolidation, pleural effusion, or pneumothorax. No acute osseous abnormalities are seen.  IMPRESSION: Hypoventilatory chest with mild bibasilar atelectasis.   Electronically Signed   By: Jeb Levering M.D.   On: 12/27/2013 13:05   Mr Brain Wo Contrast  12/27/2013   CLINICAL DATA:  58 year old female with history of left frontal lobe tumor post resection. Migraine headaches. Presenting with dysphagia weakness for the past 6 days. Obesity. Hyperlipidemia. Initial encounter.  EXAM: MRI HEAD WITHOUT CONTRAST  TECHNIQUE: Multiplanar, multiecho pulse sequences of the  brain and surrounding structures were obtained without intravenous contrast.  COMPARISON:  08/23/2005.  FINDINGS: Suggestion of tiny acute nonhemorrhagic infarct left periatrial region.  Post left frontal lobe craniotomy and resection of portion of the anterior left frontal lobe. When compared to the most recent exam available from 2007, there has been a change in appearance of the resection cavity with increase in lobulated septated cystic component, further dilation of the left lateral ventricle and increase in white matter changes throughout the hemispheres more notable on left. It is possible this represents post therapy changes however, correlation with more recent MR recommended to exclude the possibility of recurrent tumor.  Progressive atrophy.  Tiny regions of blood breakdown products probably postoperative/post therapy in origin.  Major intracranial vascular structures are patent.  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  Cervical medullary junction unremarkable.  Partially empty sella once again noted and felt to be an incidental finding.  Orbital structures unremarkable.  IMPRESSION: Suggestion of tiny acute nonhemorrhagic infarct left periatrial region.  Post left frontal lobe craniotomy and resection of portion of the anterior left frontal lobe. When compared to the most recent exam available from 2007, there has been a change in appearance of the resection cavity with increase in lobulated septated cystic component, further dilation of the left lateral ventricle and increase in white matter changes throughout the hemispheres more notable on left. It is possible this represents post therapy changes however, correlation with more recent MR recommended to exclude the possibility of recurrent tumor.  Progressive atrophy.   Electronically Signed   By: Chauncey Cruel M.D.   On: 12/27/2013 13:18     EKG Interpretation None      MDM   Final diagnoses:  Cough  Dysphagia  Upper GI bleed  Anemia, unspecified anemia  type  Stroke    Patient with difficulty swallowing. Discussed with neurology since she has had extensive recent workup for parkinsonian symptoms. MRI done and showed possible tiny acute nonhemorrhagic infarct. Also found to be severely anemic. With further questioning reportedly has had black stool for a while. Not hypotensive. Will admit to internal medicine.    Jasper Riling. Alvino Chapel, MD 12/27/13 1547

## 2013-12-27 NOTE — H&P (Signed)
History and Physical    [name]:[id number] DOB: [birth year] DOA: 12/27/2013  Referring physician: Dr. Rubin Payor PCP: Neena Rhymes, MD  Specialists: Neurology, GI  Chief Complaint: Inability to swallow  HPI: [name] is a 58 y.o. female has a past medical history significant for brain cancer status post frontal lobe resection in 1993 2005, seizure history, , migraines, fibromyalgia, secondary Parkinson's disease, followed by neurology at [facility], presents to the emergency room with a chief complaint of inability to swallow for the past week. She complains of inability to start swallowing and difficulties with solids. She is doing OK with liquids and has eben eating more soup in the past week. She also endorses dark tarry stools for the past few weeks and increasing weakness. She also endorses cough with eating and a choking episode a few days ago. She denies any chest pain or shortness of breath. She denies any abdominal pain nausea vomiting or diarrhea. She denies any lightheadedness or dizziness. In the emergency room, there was concern for CVA and she underwent a brain MRI which the fact show a tiny acute nonhemorrhagic infarct in the left periatrial region. Neurology was consulted by the ED physician. Blood work was also obtained upon presentation in the emergency room, and she was found to be profoundly anemic with a hemoglobin of 5.8, which is a drop from a previous hemoglobin of 11 in August of this year. She is not on any aspirin, she endorses using Excedrin migraine for her headaches, and also intermittent ibuprofen for her headaches. She quantifies her Excedrin as using it every other day and ibuprofen maybe once a week. She is on a PPI. [facility] asked for admission for CVA and GI bleed.   Review of Systems: As per history of present illness, otherwise negative  Past Medical History  Diagnosis Date  . Brain cancer     Frontal lobe, 1993 and 2005  . Migraines   . Morton  neuroma   . Seizures   . Migraine   . Fibromyalgia   . Right fibular fracture 07/12/2011  . 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 Anemia 03/06/2013  . Secondary Parkinson disease    Past Surgical History  Procedure Laterality Date  . Appendectomy  1976  . Excision morton's neuroma      right foot  . Brain cancer      1993 and 2005  . Orif ankle fracture  07/12/2011    Procedure: OPEN REDUCTION INTERNAL FIXATION (ORIF) ANKLE FRACTURE;  Surgeon: Eulas Post, MD;  Location: [facility] OR;  Service: Orthopedics;  Laterality: Right;  . Cholecystectomy  08/02/2013    Procedure: LAPAROSCOPIC CHOLECYSTECTOMY;  Surgeon: Cherylynn Ridges, MD;  Location: [facility] OR;  Service: General;;   Social History:  reports that she has never smoked. She has never used smokeless tobacco. She reports that she does not drink alcohol or use illicit drugs.  Allergies  Allergen Reactions  . Versed [Midazolam] Other (See Comments) and Anaphylaxis    Severe respiratory depression, had to be masked in preop holding.  . Ciprofloxacin     REACTION: NAUSEA AND VOMITING  . Cyclobenzaprine Itching    Other reaction(s): Itching / Pruritis (ALLERGY/intolerance)  . Methocarbamol     REACTION: itching Patient currently tolerating it as its on her home med list.  . Metronidazole Other (See Comments)    Unknown   .  Penicillins Rash    REACTION: RASH    Family History  Problem Relation Age of Onset  . Diabetes Mother   . Diabetes Brother   . Hypertension Mother   . Hypertension Father     Prior to Admission medications   Medication Sig Start Date End Date Taking? Authorizing Provider  Calcium Citrate-Vitamin D (CITRACAL + D PO) Take 1 tablet by mouth daily. CA 1200mg -Vitamin D 1000iu   Yes Historical Provider, MD  DULoxetine (CYMBALTA) 60 MG capsule Take 60 mg by mouth daily.   Yes Historical Provider, MD  fenofibrate 160 MG tablet Take 160 mg by mouth  daily.   Yes Historical Provider, MD  fesoterodine (TOVIAZ) 8 MG TB24 tablet Take 8 mg by mouth daily.   Yes Historical Provider, MD  fish oil-omega-3 fatty acids 1000 MG capsule Take 1 g by mouth 2 (two) times daily.     Yes Historical Provider, MD  hydrocortisone-pramoxine (ANALPRAM HC) 2.5-1 % rectal cream Place 1 application rectally 3 (three) times daily. 12/08/13  Yes Sheliah Hatch, MD  levETIRAcetam (KEPPRA) 500 MG tablet Take 1,500 mg by mouth 2 (two) times daily.    Yes Historical Provider, MD  Linaclotide Karlene Einstein) 145 MCG CAPS capsule Take 1 capsule (145 mcg total) by mouth daily. 06/07/13  Yes Sheliah Hatch, MD  losartan-hydrochlorothiazide (HYZAAR) 50-12.5 MG per tablet Take 1 tablet by mouth daily. 08/31/13  Yes Sheliah Hatch, MD  LYRICA 150 MG capsule TAKE ONE CAPSULE BY MOUTH TWICE A DAY 11/14/13  Yes Sheliah Hatch, MD  meloxicam (MOBIC) 15 MG tablet Take 15 mg by mouth daily.   Yes Historical Provider, MD  methocarbamol (ROBAXIN) 500 MG tablet Take 500 mg by mouth daily as needed for muscle spasms.   Yes Historical Provider, MD  nitrofurantoin (MACRODANTIN) 50 MG capsule Take 100 mg by mouth daily.   Yes Historical Provider, MD  omeprazole (PRILOSEC) 20 MG capsule TAKE 1 CAPSULE BY MOUTH DAILY 12/05/13  Yes Sheliah Hatch, MD  promethazine (PHENERGAN) 25 MG tablet Take 25 mg by mouth every 6 (six) hours as needed for nausea or vomiting.   Yes Historical Provider, MD  rosuvastatin (CRESTOR) 10 MG tablet Take 10 mg by mouth every Monday, Wednesday, and Friday. Mon, Wed, Friday   Yes Historical Provider, MD  traZODone (DESYREL) 50 MG tablet Take 1 tablet (50 mg total) by mouth at bedtime. 12/13/13  Yes Sheliah Hatch, MD  triamterene (DYRENIUM) 100 MG capsule Take 100 mg by mouth daily.   Yes Historical Provider, MD  HYDROcodone-acetaminophen (NORCO/VICODIN) 5-325 MG per tablet Take 1-2 tablets by mouth every 6 (six) hours as needed for moderate pain. Patient not  taking: Reported on 12/27/2013 10/10/13   Bayard Beaver Saguier, PA-C   Physical Exam: Filed Vitals:   12/27/13 1330 12/27/13 1340 12/27/13 1345 12/27/13 1427  BP: 111/74 111/74 104/48 105/69  Pulse: 83 82 84 81  Temp:      TempSrc:      Resp: 20 20 18 20   SpO2: 97% 100% 97% 100%     General:  No apparent distress, pale appearing Caucasian female   Eyes: PERRL, EOMI, no scleral icterus  ENT: moist oropharynx  Neck: supple, no JVD  Cardiovascular: regular rate without MRG; 2+ peripheral pulses  Respiratory: CTA biL, good air movement without wheezing, rhonchi or crackled  Abdomen: soft, non tender to palpation, positive bowel sounds, no guarding, no rebound  Skin: no rashes  Musculoskeletal: no peripheral edema  Psychiatric: normal mood and affect  Neurologic: Cranial nerves grossly intact, strength 5 out of 5 in all 4 extremities,  Labs on Admission:  Basic Metabolic Panel:  Recent Labs Lab 12/27/13 1113  NA 140  K 3.7  CL 105  CO2 23  GLUCOSE 96  BUN 19  CREATININE 1.57*  CALCIUM 9.9   Liver Function Tests: No results for input(s): AST, ALT, ALKPHOS, BILITOT, PROT, ALBUMIN in the last 168 hours. No results for input(s): LIPASE, AMYLASE in the last 168 hours. No results for input(s): AMMONIA in the last 168 hours. CBC:  Recent Labs Lab 12/27/13 1113  WBC 4.3  NEUTROABS 2.3  HGB 5.8*  HCT 19.2*  MCV 83.8  PLT 230   Cardiac Enzymes: No results for input(s): CKTOTAL, CKMB, CKMBINDEX, TROPONINI in the last 168 hours.  BNP (last 3 results) No results for input(s): PROBNP in the last 8760 hours. CBG: No results for input(s): GLUCAP in the last 168 hours.  Radiological Exams on Admission: Dg Chest 2 View  12/27/2013   CLINICAL DATA:  Cough.  Dysphasia and weakness.  EXAM: CHEST  2 VIEW  COMPARISON:  07/31/2013  FINDINGS: Lung volumes are low. Minimal left greater than right basilar atelectasis. The cardiomediastinal contours are normal. Pulmonary  vasculature is normal. No consolidation, pleural effusion, or pneumothorax. No acute osseous abnormalities are seen.  IMPRESSION: Hypoventilatory chest with mild bibasilar atelectasis.   Electronically Signed   By: Rubye Oaks M.D.   On: 12/27/2013 13:05   Mr Brain Wo Contrast  12/27/2013   CLINICAL DATA:  58 year old female with history of left frontal lobe tumor post resection. Migraine headaches. Presenting with dysphagia weakness for the past 6 days. Obesity. Hyperlipidemia. Initial encounter.  EXAM: MRI HEAD WITHOUT CONTRAST  TECHNIQUE: Multiplanar, multiecho pulse sequences of the brain and surrounding structures were obtained without intravenous contrast.  COMPARISON:  08/23/2005.  FINDINGS: Suggestion of tiny acute nonhemorrhagic infarct left periatrial region.  Post left frontal lobe craniotomy and resection of portion of the anterior left frontal lobe. When compared to the most recent exam available from 2007, there has been a change in appearance of the resection cavity with increase in lobulated septated cystic component, further dilation of the left lateral ventricle and increase in white matter changes throughout the hemispheres more notable on left. It is possible this represents post therapy changes however, correlation with more recent MR recommended to exclude the possibility of recurrent tumor.  Progressive atrophy.  Tiny regions of blood breakdown products probably postoperative/post therapy in origin.  Major intracranial vascular structures are patent.  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  Cervical medullary junction unremarkable.  Partially empty sella once again noted and felt to be an incidental finding.  Orbital structures unremarkable.  IMPRESSION: Suggestion of tiny acute nonhemorrhagic infarct left periatrial region.  Post left frontal lobe craniotomy and resection of portion of the anterior left frontal lobe. When compared to the most recent exam available from 2007, there has been a change  in appearance of the resection cavity with increase in lobulated septated cystic component, further dilation of the left lateral ventricle and increase in white matter changes throughout the hemispheres more notable on left. It is possible this represents post therapy changes however, correlation with more recent MR recommended to exclude the possibility of recurrent tumor.  Progressive atrophy.   Electronically Signed   By: Bridgett Larsson M.D.   On: 12/27/2013 13:18    EKG: Independently reviewed.  Assessment/Plan Principal Problem:   GI bleed  Active Problems:   Anemia   Weakness   Secondary Parkinson disease   CVA (cerebral infarction)    Acute CVA  - patient will be admitted to telemetry floor, obtain 2-D echo as well as carotid Dopplers, MRI done in the emergency room. Neurology has been consulted, appreciate input. We'll hold on administering patient aspirin at this point given concern for GI bleed. Obtain lipid panel and hemoglobin A1c.  Acute blood loss anemia - likely due to a GI bleed. Patient with intermittent melena for the past 3-4 weeks, she was found to be profoundly anemic with a hemoglobin of 5.8, she will be transfused 2 units of packed red blood cells. I've consult to gastroenterology, appreciate their help and expertise in terms of her dysphagia as well as possible GI bleed. Patient will likely need to be on aspirin and it would be important to risk stratify her from a GI standpoint. - Start IV PPI twice daily - Avoid NSAIDs  Dysphagia - patient with signs and symptoms of aspiration at home and she reports coughing while eating. We'll make patient nothing by mouth, obtain speech pathology evaluation.  Secondary Parkinson's disease - defer to neurology for the treatment  Seizure disorder - she has not had a seizure in more than a year. Continue her home medications. Monitor on telemetry.  Fibromyalgia - continue her home medications  Hyperlipidemia - continue  statin    Diet: NPO Fluids: NS DVT Prophylaxis: SCD  Code Status: Full  Family Communication: d/w husband  Disposition Plan: inpatient  Time spent: 52  Jori Frerichs M. Elvera Lennox, MD [facility] Pager [phone]  If 7PM-7AM, please contact night-coverage [email or web address] Password [email or web address] 12/27/2013, 2:40 PM

## 2013-12-27 NOTE — ED Notes (Signed)
Neurology at bedside.

## 2013-12-28 ENCOUNTER — Encounter (HOSPITAL_COMMUNITY): Payer: Self-pay | Admitting: *Deleted

## 2013-12-28 ENCOUNTER — Encounter (HOSPITAL_COMMUNITY)
Admission: EM | Disposition: A | Discharge status: Home / Self Care | Payer: Self-pay | Source: Home / Self Care | Attending: Internal Medicine

## 2013-12-28 DIAGNOSIS — I519 Heart disease, unspecified: Secondary | ICD-10-CM

## 2013-12-28 DIAGNOSIS — K222 Esophageal obstruction: Secondary | ICD-10-CM

## 2013-12-28 DIAGNOSIS — K921 Melena: Secondary | ICD-10-CM

## 2013-12-28 DIAGNOSIS — G219 Secondary parkinsonism, unspecified: Secondary | ICD-10-CM

## 2013-12-28 DIAGNOSIS — I63312 Cerebral infarction due to thrombosis of left middle cerebral artery: Secondary | ICD-10-CM

## 2013-12-28 DIAGNOSIS — R1314 Dysphagia, pharyngoesophageal phase: Secondary | ICD-10-CM

## 2013-12-28 DIAGNOSIS — I639 Cerebral infarction, unspecified: Secondary | ICD-10-CM

## 2013-12-28 HISTORY — PX: SAVORY DILATION: SHX5439

## 2013-12-28 HISTORY — PX: ESOPHAGOGASTRODUODENOSCOPY: SHX5428

## 2013-12-28 LAB — COMPREHENSIVE METABOLIC PANEL
ALT: 10 U/L (ref 0–35)
AST: 20 U/L (ref 0–37)
Albumin: 3.1 g/dL — ABNORMAL LOW (ref 3.5–5.2)
Alkaline Phosphatase: 30 U/L — ABNORMAL LOW (ref 39–117)
Anion gap: 8 (ref 5–15)
BUN: 17 mg/dL (ref 6–23)
CO2: 22 mmol/L (ref 19–32)
Calcium: 8.8 mg/dL (ref 8.4–10.5)
Chloride: 110 mEq/L (ref 96–112)
Creatinine, Ser: 1.36 mg/dL — ABNORMAL HIGH (ref 0.50–1.10)
GFR calc Af Amer: 49 mL/min — ABNORMAL LOW (ref >90)
GFR calc non Af Amer: 43 mL/min — ABNORMAL LOW (ref >90)
GLUCOSE: 76 mg/dL (ref 70–99)
Potassium: 3.9 mmol/L (ref 3.5–5.1)
SODIUM: 140 mmol/L (ref 135–145)
TOTAL PROTEIN: 6 g/dL (ref 6.0–8.3)
Total Bilirubin: 1.6 mg/dL — ABNORMAL HIGH (ref 0.3–1.2)

## 2013-12-28 LAB — HEMOGLOBIN A1C
Hgb A1c MFr Bld: 5.5 % (ref <5.7)
MEAN PLASMA GLUCOSE: 111 mg/dL (ref <117)

## 2013-12-28 LAB — CBC
HCT: 27.9 % — ABNORMAL LOW (ref 36.0–46.0)
HCT: 30 % — ABNORMAL LOW (ref 36.0–46.0)
Hemoglobin: 9 g/dL — ABNORMAL LOW (ref 12.0–15.0)
Hemoglobin: 9.5 g/dL — ABNORMAL LOW (ref 12.0–15.0)
MCH: 27.1 pg (ref 26.0–34.0)
MCH: 26.5 pg (ref 26.0–34.0)
MCHC: 32.3 g/dL (ref 30.0–36.0)
MCHC: 31.7 g/dL (ref 30.0–36.0)
MCV: 84 fL (ref 78.0–100.0)
MCV: 83.6 fL (ref 78.0–100.0)
PLATELETS: 187 10*3/uL (ref 150–400)
Platelets: 182 10*3/uL (ref 150–400)
RBC: 3.32 MIL/uL — AB (ref 3.87–5.11)
RBC: 3.59 MIL/uL — ABNORMAL LOW (ref 3.87–5.11)
RDW: 15.6 % — ABNORMAL HIGH (ref 11.5–15.5)
RDW: 16.1 % — AB (ref 11.5–15.5)
WBC: 4.1 10*3/uL (ref 4.0–10.5)
WBC: 4.8 10*3/uL (ref 4.0–10.5)

## 2013-12-28 LAB — LIPID PANEL
CHOL/HDL RATIO: 3.4 ratio
Cholesterol: 110 mg/dL (ref 0–200)
HDL: 32 mg/dL — AB (ref >39)
LDL CALC: 60 mg/dL (ref 0–99)
TRIGLYCERIDES: 92 mg/dL (ref <150)
VLDL: 18 mg/dL (ref 0–40)

## 2013-12-28 SURGERY — EGD (ESOPHAGOGASTRODUODENOSCOPY)
Anesthesia: Moderate Sedation

## 2013-12-28 MED ORDER — DIPHENHYDRAMINE HCL 50 MG/ML IJ SOLN
INTRAMUSCULAR | Status: DC | PRN
  Administered 2013-12-28 (×2): 12.5 mg via INTRAVENOUS

## 2013-12-28 MED ORDER — DIPHENHYDRAMINE HCL 50 MG/ML IJ SOLN
INTRAMUSCULAR | Status: AC
  Filled 2013-12-28: qty 1

## 2013-12-28 MED ORDER — BUTAMBEN-TETRACAINE-BENZOCAINE 2-2-14 % EX AERO
INHALATION_SPRAY | CUTANEOUS | Status: DC | PRN
  Administered 2013-12-28: 2 via TOPICAL

## 2013-12-28 MED ORDER — DIAZEPAM 5 MG/ML IJ SOLN
INTRAMUSCULAR | Status: AC
  Filled 2013-12-28: qty 2

## 2013-12-28 MED ORDER — LORAZEPAM 2 MG/ML IJ SOLN
1.0000 mg | Freq: Once | INTRAMUSCULAR | Status: AC
  Administered 2013-12-28: 1 mg via INTRAVENOUS
  Filled 2013-12-28: qty 1

## 2013-12-28 MED ORDER — FENTANYL CITRATE 0.05 MG/ML IJ SOLN
INTRAMUSCULAR | Status: DC | PRN
  Administered 2013-12-28 (×2): 25 ug via INTRAVENOUS

## 2013-12-28 MED ORDER — FENTANYL CITRATE 0.05 MG/ML IJ SOLN
INTRAMUSCULAR | Status: AC
  Filled 2013-12-28: qty 2

## 2013-12-28 NOTE — Evaluation (Signed)
Clinical/Bedside Swallow Evaluation Patient Details  Name: [name] MRN: [id number] Date of Birth: 23-Mar-1955  Today's Date: 12/28/2013 Time: 4403-4742 SLP Time Calculation (min) (ACUTE ONLY): 18 min  Past Medical History:  Past Medical History  Diagnosis Date  . Brain cancer     Frontal lobe, 1993 and 2005  . Migraines   . Morton neuroma   . Seizures   . Migraine   . Fibromyalgia   . Right fibular fracture 07/12/2011  . 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 Anemia 03/06/2013  . Secondary Parkinson disease    Past Surgical History:  Past Surgical History  Procedure Laterality Date  . Appendectomy  1976  . Excision morton's neuroma      right foot  . Brain cancer      1993 and 2005  . Orif ankle fracture  07/12/2011    Procedure: OPEN REDUCTION INTERNAL FIXATION (ORIF) ANKLE FRACTURE;  Surgeon: Johnny Bridge, MD;  Location: [facility] [place];  Service: Orthopedics;  Laterality: Right;  . Cholecystectomy  08/02/2013    Procedure: LAPAROSCOPIC CHOLECYSTECTOMY;  Surgeon: Gwenyth Ober, MD;  Location: [place];  Service: General;;   HPI:  [name] is a 58 y.o. female with PMH significant for a left frontal low grade glioma resection in 1993. She developed seizures in 2000 and subsequently had a left temporal lobe resection in 2004. In 2006, she had full radiation treatment. Patient has been diagnosed with secondary parkinson's and is currently scheduled for DAT scan LP for possible hydrocephalus. Pt presents with black tarry stooks over the past three weeks, and difficulty swallowing solid foods over the past week. MRI revealed acute infarct left periatrial region. Neurology and GI are following.   Assessment / Plan / Recommendation Clinical Impression  Pt presents with a mild cpgnitively-based oral dysphagia marked by slow mastication and oral transit, as well as brief oral holding of liquids. Despite the above, no  overt signs of aspiration were observed. Based upon pt and husband's reports of symptoms, suspect a primary esophageal dysphagia. Note that GI is already following. Will f/u for tolerance pending their work up. Will return for cognitive-linguistic evaluation as well as pt was being transported from room for testing. For now, recommend Dys 2 diet and thin liquids as this appears to be what she has been tolerating at home.     Aspiration Risk  Mild    Diet Recommendation Dysphagia 2 (Fine chop);Thin liquid   Liquid Administration via: Cup;Straw Medication Administration: Crushed with puree Supervision: Patient able to self feed;Full supervision/cueing for compensatory strategies Compensations: Slow rate;Small sips/bites;Follow solids with liquid Postural Changes and/or Swallow Maneuvers: Seated upright 90 degrees;Upright 30-60 min after meal    Other  Recommendations Oral Care Recommendations: Oral care BID   Follow Up Recommendations  None    Frequency and Duration min 2x/week  1 week   Pertinent Vitals/Pain n/a    SLP Swallow Goals     Swallow Study Prior Functional Status       General Date of Onset:  (~1 week ago) HPI: [name] is a 58 y.o. female with PMH significant for a left frontal low grade glioma resection in 1993. She developed seizures in 2000 and subsequently had a left temporal lobe resection in 2004. In 2006, she had full radiation treatment. Patient has been diagnosed with secondary parkinson's and is currently scheduled for DAT scan  LP for possible hydrocephalus. Pt presents with black tarry stooks over the past three weeks, and difficulty swallowing solid foods over the past week. MRI revealed acute infarct left periatrial region. Neurology and GI are following. Type of Study: Bedside swallow evaluation Previous Swallow Assessment: none in chart Diet Prior to this Study: NPO Temperature Spikes Noted: No Respiratory Status: Room air History of Recent  Intubation: No Behavior/Cognition: Alert;Cooperative;Requires cueing Oral Cavity - Dentition: Adequate natural dentition Self-Feeding Abilities: Able to feed self Patient Positioning: Upright in bed Baseline Vocal Quality: Clear Volitional Cough: Strong (delayed) Volitional Swallow: Able to elicit    Oral/Motor/Sensory Function Overall Oral Motor/Sensory Function: Appears within functional limits for tasks assessed   Ice Chips Ice chips: Not tested   Thin Liquid Thin Liquid: Impaired Presentation: Self Fed;Straw Oral Phase Functional Implications: Oral holding    Nectar Thick Nectar Thick Liquid: Not tested   Honey Thick Honey Thick Liquid: Not tested   Puree Puree: Impaired Presentation: Self Fed;Spoon Oral Phase Functional Implications: Prolonged oral transit   Solid   GO    Solid: Impaired Presentation: Self Fed Oral Phase Functional Implications: Other (comment) (prolonged mastication/transit)        [name], M.A. CCC-SLP [phone]  [name] 12/28/2013,10:28 AM

## 2013-12-28 NOTE — Progress Notes (Signed)
Daily Rounding Note  12/28/2013, 10:12 AM  LOS: 1 day   SUBJECTIVE:       No problems tolerating blood transfusions yesterday.  Still feels weak and tired. No abdominal pain, no nausea.   SLP did bedside eval,  Notes oral holding, prolonged oral transit and rec chopped diet with thin liquids.   No BMs.   OBJECTIVE:         Vital signs in last 24 hours:    Temp:  [97.4 F (36.3 C)-98.6 F (37 C)] 97.9 F (36.6 C) (12/23 0716) Pulse Rate:  [80-100] 82 (12/23 0716) Resp:  [16-20] 16 (12/23 0716) BP: (90-130)/(39-80) 110/63 mmHg (12/23 0716) SpO2:  [93 %-100 %] 97 % (12/23 0716) Weight:  [210 lb (95.255 kg)] 210 lb (95.255 kg) (12/22 1556) Last BM Date: 12/26/13 Filed Weights   12/27/13 1556  Weight: 210 lb (95.255 kg)   General: alert, comfortable.  Relaxed.    Intake/Output from previous day: 12/22 0701 - 12/23 0700 In: 670 [Blood:670] Out: 300 [Urine:300]  Intake/Output this shift:    Lab Results:  Recent Labs  12/27/13 1113 12/28/13 0928  WBC 4.3 4.1  HGB 5.8* 9.0*  HCT 19.2* 27.9*  PLT 230 182   BMET  Recent Labs  12/27/13 1113  NA 140  K 3.7  CL 105  CO2 23  GLUCOSE 96  BUN 19  CREATININE 1.57*  CALCIUM 9.9    Studies/Results: Dg Chest 2 View  12/27/2013   CLINICAL DATA:  Cough.  Dysphasia and weakness.  EXAM: CHEST  2 VIEW  COMPARISON:  07/31/2013  FINDINGS: Lung volumes are low. Minimal left greater than right basilar atelectasis. The cardiomediastinal contours are normal. Pulmonary vasculature is normal. No consolidation, pleural effusion, or pneumothorax. No acute osseous abnormalities are seen.  IMPRESSION: Hypoventilatory chest with mild bibasilar atelectasis.   Electronically Signed   By: Jeb Levering M.D.   On: 12/27/2013 13:05   Mr Jodene Nam Head Wo Contrast  12/27/2013   CLINICAL DATA:  Initial evaluation for stroke.  EXAM: MRA HEAD WITHOUT CONTRAST  TECHNIQUE: Angiographic  images of the Circle of Willis were obtained using MRA technique without intravenous contrast.  COMPARISON:  Prior MRI performed earlier on the same day.  FINDINGS: ANTERIOR CIRCULATION:  Visualized portions of the distal cervical segments of the internal carotid arteries are widely patent bilaterally. The petrous, cavernous, and supra clinoid segments are well opacified bilaterally without hemodynamically significant stenosis.  No proximal branch occlusion or hemodynamically significant stenosis identified within the M1 segments bilaterally. The distal left MCA branches are attenuated as compared to the right. This may in part be related to postoperative changes.  A1 segments, anterior communicating artery, and anterior cerebral arteries well opacified bilaterally. A2 segments within normal limits.  POSTERIOR CIRCULATION:  Vertebral arteries are codominant. Vertebrobasilar junction and basilar artery are widely patent and normal in appearance. The right anterior inferior cerebral artery is dominant. Superior cerebral arteries not well evaluated on this exam, with suggestion of multi focal atherosclerotic irregularity. P1 and P2 segments well opacified bilaterally. There is atherosclerotic irregularity within the distal right PCA.  No aneurysm or vascular malformation.  IMPRESSION: 1. No proximal branch occlusion or hemodynamically significant stenosis identified within the intracranial circulation. 2. Irregularity with attenuation of the distal left MCA branches as compared to the right. While this may in part be related to underlying mild atheromatous disease, finding suspected to largely be related to postoperative changes.  3. Mild multi focal atherosclerotic irregularity within the distal right PCA without focal high-grade stenosis.   Electronically Signed   By: Jeannine Boga M.D.   On: 12/27/2013 22:39   Mr Brain Wo Contrast  12/27/2013   CLINICAL DATA:  58 year old female with history of left  frontal lobe tumor post resection. Migraine headaches. Presenting with dysphagia weakness for the past 6 days. Obesity. Hyperlipidemia. Initial encounter.  EXAM: MRI HEAD WITHOUT CONTRAST  TECHNIQUE: Multiplanar, multiecho pulse sequences of the brain and surrounding structures were obtained without intravenous contrast.  COMPARISON:  08/23/2005.  FINDINGS: Suggestion of tiny acute nonhemorrhagic infarct left periatrial region.  Post left frontal lobe craniotomy and resection of portion of the anterior left frontal lobe. When compared to the most recent exam available from 2007, there has been a change in appearance of the resection cavity with increase in lobulated septated cystic component, further dilation of the left lateral ventricle and increase in white matter changes throughout the hemispheres more notable on left. It is possible this represents post therapy changes however, correlation with more recent MR recommended to exclude the possibility of recurrent tumor.  Progressive atrophy.  Tiny regions of blood breakdown products probably postoperative/post therapy in origin.  Major intracranial vascular structures are patent.  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  Cervical medullary junction unremarkable.  Partially empty sella once again noted and felt to be an incidental finding.  Orbital structures unremarkable.  IMPRESSION: Suggestion of tiny acute nonhemorrhagic infarct left periatrial region.  Post left frontal lobe craniotomy and resection of portion of the anterior left frontal lobe. When compared to the most recent exam available from 2007, there has been a change in appearance of the resection cavity with increase in lobulated septated cystic component, further dilation of the left lateral ventricle and increase in white matter changes throughout the hemispheres more notable on left. It is possible this represents post therapy changes however, correlation with more recent MR recommended to exclude the possibility  of recurrent tumor.  Progressive atrophy.   Electronically Signed   By: Chauncey Cruel M.D.   On: 12/27/2013 13:18   Scheduled Meds: . sodium chloride   Intravenous Once  . DULoxetine  60 mg Oral Daily  . fenofibrate  160 mg Oral Daily  . fesoterodine  8 mg Oral Daily  . hydrocortisone-pramoxine  1 applicator Rectal TID  . levETIRAcetam  1,500 mg Oral BID  . Linaclotide  145 mcg Oral Daily  . nitrofurantoin  100 mg Oral Daily  . pantoprazole (PROTONIX) IV  40 mg Intravenous Q12H  . pregabalin  150 mg Oral BID  . rosuvastatin  10 mg Oral Q M,W,F  . traZODone  50 mg Oral QHS   Continuous Infusions: . sodium chloride 20 mL/hr at 12/27/13 2012   PRN Meds:.HYDROcodone-acetaminophen, methocarbamol, promethazine, senna-docusate  ASSESMENT:   * Acute anemia, MCV normal. Black, tarry stools. Rule out ulcer disease. Rule out AVM. Hemoglobin much improved following transfusion with PRBCs x 2.    * Recent onset, acute dysphagia. The sudden acuity of this problem weighs against esophageal dysmotility as well as stricture but these are part of the differential. She is not diabetic and her glucoses are within normal range so the likelihood of Candida esophagitis is not high. However she is on chronic suppressive antibiotics which can lead to Candida infection.  * Brain cancer and seizures. Current MRI showing tiny left, periatrial, nonhemorrhagic infarct. Per neuro this is an incidental finding and is not an explanation for her  new onset dysphagia.     PLAN   *  EGD this afternoon.  Leave on BID IV Protonix for now.  *  May need MBSS vs barium esophagram to sort out dysphagia.     [name]  12/28/2013, 10:12 AM Pager: [phone] GI Attending Note  I have personally taken an interval history, reviewed the chart, and examined the patient.  I agree with the extender's note, impression and recommendations.  Sandy Salaam. Deatra Ina, MD, [facility] [phone]

## 2013-12-28 NOTE — Progress Notes (Signed)
OT cancellation    12/28/13 1530  OT Visit Information  Last OT Received On 12/28/13  Reason Eval/Treat Not Completed Patient at procedure or test/ unavailable   Roseanne Reno, OTR/L [phone]

## 2013-12-28 NOTE — Progress Notes (Signed)
[facility] PROGRESS NOTE  DARETH ANDREW POE:[id number] DOB: 30-May-1955 DOA: 12/27/2013  PCP: Annye Asa, MD  Brief HPI: 58 year old Caucasian female with a past medical history of brain cancer, status post frontal lobe resection in 1993, history of secondary Parkinson's disease who presented with difficulty swallowing for the past 1-2 weeks. She was also noted to have black tarry stools. She was profoundly anemic. She was subsequently admitted to the hospital. MRI was done because of concern for stroke, which showed a tiny acute infarct in the left periatrial region. She was subsequently admitted to the hospital for further workup.  Past medical history:  Past Medical History  Diagnosis Date  . Brain cancer     Frontal lobe, 1993 and 2005  . Migraines   . Morton neuroma   . Seizures   . Migraine   . Fibromyalgia   . Right fibular fracture 07/12/2011  . 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 Anemia 03/06/2013  . Secondary Parkinson disease     Consultants: Neurology and gastroenterology  Procedures:  EGD scheduled for 12/23  Carotid Doppler 1-39% ICA stenosis. Vertebral artery flow is antegrade.  2-D echocardiogram. Study Conclusions - Left ventricle: The cavity size was normal. Systolic function wasnormal. The estimated ejection fraction was in the range of 55%to 60%. Wall motion was normal; there were no regional wallmotion abnormalities. Doppler parameters are consistent withabnormal left ventricular relaxation (grade 1 diastolicdysfunction). - Pulmonary arteries: PA peak pressure: 31 mm Hg (S). Impressions: - Normal LV function; grade 1 diastolic dysfunction; trace TR.  Antibiotics: None  Subjective: Patient is a poor historian. She denies any pain currently. Accompanied by her husband who was at bedside.  Objective: Vital Signs  Filed Vitals:   12/28/13 0409 12/28/13 0440  12/28/13 0658 12/28/13 0716  BP: 93/61 98/60 94/62  110/63  Pulse: 90 92 80 82  Temp: 97.4 F (36.3 C) 97.6 F (36.4 C) 97.8 F (36.6 C) 97.9 F (36.6 C)  TempSrc: Oral Oral Oral Oral  Resp: 20 18 18 16   Height:      Weight:      SpO2: 97%  99% 97%    Intake/Output Summary (Last 24 hours) at 12/28/13 1455 Last data filed at 12/28/13 0425  Gross per 24 hour  Intake    670 ml  Output    300 ml  Net    370 ml   Filed Weights   12/27/13 1556  Weight: 95.255 kg (210 lb)    General appearance: alert, cooperative, appears stated age, no distress and moderately obese Head: Normocephalic, without obvious abnormality, atraumatic Resp: clear to auscultation bilaterally Cardio: regular rate and rhythm, S1, S2 normal, no murmur, click, rub or gallop GI: soft, non-tender; bowel sounds normal; no masses,  no organomegaly Extremities: extremities normal, atraumatic, no cyanosis or edema  Lab Results:  Basic Metabolic Panel:  Recent Labs Lab 12/27/13 1113 12/28/13 0928  NA 140 140  K 3.7 3.9  CL 105 110  CO2 23 22  GLUCOSE 96 76  BUN 19 17  CREATININE 1.57* 1.36*  CALCIUM 9.9 8.8   Liver Function Tests:  Recent Labs Lab 12/28/13 0928  AST 20  ALT 10  ALKPHOS 30*  BILITOT 1.6*  PROT 6.0  ALBUMIN 3.1*   CBC:  Recent Labs Lab 12/27/13 1113 12/28/13 0928  WBC 4.3 4.1  NEUTROABS 2.3  --   HGB 5.8* 9.0*  HCT 19.2* 27.9*  MCV 83.8 84.0  PLT 230 182    Studies/Results: Dg Chest 2 View  12/27/2013   CLINICAL DATA:  Cough.  Dysphasia and weakness.  EXAM: CHEST  2 VIEW  COMPARISON:  07/31/2013  FINDINGS: Lung volumes are low. Minimal left greater than right basilar atelectasis. The cardiomediastinal contours are normal. Pulmonary vasculature is normal. No consolidation, pleural effusion, or pneumothorax. No acute osseous abnormalities are seen.  IMPRESSION: Hypoventilatory chest with mild bibasilar atelectasis.   Electronically Signed   By: Jeb Levering M.D.    On: 12/27/2013 13:05   Mr Jodene Nam Head Wo Contrast  12/27/2013   CLINICAL DATA:  Initial evaluation for stroke.  EXAM: MRA HEAD WITHOUT CONTRAST  TECHNIQUE: Angiographic images of the Circle of Willis were obtained using MRA technique without intravenous contrast.  COMPARISON:  Prior MRI performed earlier on the same day.  FINDINGS: ANTERIOR CIRCULATION:  Visualized portions of the distal cervical segments of the internal carotid arteries are widely patent bilaterally. The petrous, cavernous, and supra clinoid segments are well opacified bilaterally without hemodynamically significant stenosis.  No proximal branch occlusion or hemodynamically significant stenosis identified within the M1 segments bilaterally. The distal left MCA branches are attenuated as compared to the right. This may in part be related to postoperative changes.  A1 segments, anterior communicating artery, and anterior cerebral arteries well opacified bilaterally. A2 segments within normal limits.  POSTERIOR CIRCULATION:  Vertebral arteries are codominant. Vertebrobasilar junction and basilar artery are widely patent and normal in appearance. The right anterior inferior cerebral artery is dominant. Superior cerebral arteries not well evaluated on this exam, with suggestion of multi focal atherosclerotic irregularity. P1 and P2 segments well opacified bilaterally. There is atherosclerotic irregularity within the distal right PCA.  No aneurysm or vascular malformation.  IMPRESSION: 1. No proximal branch occlusion or hemodynamically significant stenosis identified within the intracranial circulation. 2. Irregularity with attenuation of the distal left MCA branches as compared to the right. While this may in part be related to underlying mild atheromatous disease, finding suspected to largely be related to postoperative changes. 3. Mild multi focal atherosclerotic irregularity within the distal right PCA without focal high-grade stenosis.    Electronically Signed   By: Jeannine Boga M.D.   On: 12/27/2013 22:39   Mr Brain Wo Contrast  12/27/2013   CLINICAL DATA:  58 year old female with history of left frontal lobe tumor post resection. Migraine headaches. Presenting with dysphagia weakness for the past 6 days. Obesity. Hyperlipidemia. Initial encounter.  EXAM: MRI HEAD WITHOUT CONTRAST  TECHNIQUE: Multiplanar, multiecho pulse sequences of the brain and surrounding structures were obtained without intravenous contrast.  COMPARISON:  08/23/2005.  FINDINGS: Suggestion of tiny acute nonhemorrhagic infarct left periatrial region.  Post left frontal lobe craniotomy and resection of portion of the anterior left frontal lobe. When compared to the most recent exam available from 2007, there has been a change in appearance of the resection cavity with increase in lobulated septated cystic component, further dilation of the left lateral ventricle and increase in white matter changes throughout the hemispheres more notable on left. It is possible this represents post therapy changes however, correlation with more recent MR recommended to exclude the possibility of recurrent tumor.  Progressive atrophy.  Tiny regions of blood breakdown products probably postoperative/post therapy in origin.  Major intracranial vascular structures are patent.  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  Cervical medullary junction unremarkable.  Partially empty sella once again noted and felt to be an incidental finding.  Orbital structures unremarkable.  IMPRESSION: Suggestion of tiny acute nonhemorrhagic infarct left periatrial region.  Post left frontal lobe craniotomy and resection of portion of the anterior left frontal lobe. When compared to the most recent exam available from 2007, there has been a change in appearance of the resection cavity with increase in lobulated septated cystic component, further dilation of the left lateral ventricle and increase in white matter changes  throughout the hemispheres more notable on left. It is possible this represents post therapy changes however, correlation with more recent MR recommended to exclude the possibility of recurrent tumor.  Progressive atrophy.   Electronically Signed   By: Chauncey Cruel M.D.   On: 12/27/2013 13:18    Medications:  Scheduled: . sodium chloride   Intravenous Once  . DULoxetine  60 mg Oral Daily  . fenofibrate  160 mg Oral Daily  . fesoterodine  8 mg Oral Daily  . hydrocortisone-pramoxine  1 applicator Rectal TID  . levETIRAcetam  1,500 mg Oral BID  . Linaclotide  145 mcg Oral Daily  . nitrofurantoin  100 mg Oral Daily  . pantoprazole (PROTONIX) IV  40 mg Intravenous Q12H  . pregabalin  150 mg Oral BID  . rosuvastatin  10 mg Oral Q M,W,F  . traZODone  50 mg Oral QHS   Continuous: . sodium chloride 20 mL/hr at 12/27/13 2012   JGO:[id number], methocarbamol, promethazine, senna-docusate  Assessment/Plan:  Principal Problem:   GI bleed Active Problems:   Anemia   Weakness   Secondary Parkinson disease   CVA (cerebral infarction)   Dysphagia, pharyngoesophageal phase   Dysphagia    Acute CVA Could be an incidental finding per neurology. Stroke workup was in progress. No antiplatelet agents for now due to concerns for GI bleeding. We will need to determine when this can be started once GI workup is completed.LDL is 69. Neurology is following. PT and OT to evaluate.  Acute blood loss anemia Likely due to a GI bleed. Patient with intermittent melena for the past 3-4 weeks. She was found to be profoundly anemic with a hemoglobin of 5.8. She was transfused 2 units of blood. Hemoglobin has responded appropriately. Monitor CBCs closely.  Possible [facility] is following. Plan is for EGD this afternoon. She is currently nothing by mouth. Continue PPI.  Dysphagia Patient with signs and symptoms of aspiration at home and she reports coughing while eating. She has  been seen by speech pathology. They're recommending a dysphagia 2 diet. Unclear what is causing her symptomatology. We will see what EGD reveals. May require further workup. Unclear if her history of Parkinson's is playing a role.   Secondary Parkinson's disease She is being followed by neurology at [facility].   Seizure disorder She has not had a seizure in more than a year. Continue her home medications.   Fibromyalgia Continue her home medications  Hyperlipidemia Continue statin  DVT Prophylaxis: SCDs    Code Status: Full code  Family Communication: Discussed with the patient and her husband  Disposition Plan: Likely home when medically improved.    LOS: 1 day   [facility] Pager [phone] 12/28/2013, 2:55 PM  If 8PM-8AM, please contact night-coverage at [email or web address], password [facility]

## 2013-12-28 NOTE — H&P (View-Only) (Signed)
[facility] Consult: 2:35 PM 12/27/2013  LOS: 0 days    Referring Provider: Dr Cruzita Lederer  Primary Care Physician:  Leslie Asa, MD Primary Gastroenterologist:  Dr. Olevia Perches     Reason for Consultation:  Anemia, dark stool.    HPI: [name] is a 59 y.o. female. S/p multiple brain surgeries as well as radiation for cancer, seizures.   Secondary Parkinson's.  She is wheelchair-bound. She takes daily antibiotics for history of recurrent UTIs.   Abnormal LFTs in 07/2013.  AST/ALT 530/345.  Alk phos 122.  t bili 1.2 at time of acute cholecystitis and GB hydrops.  S/p 08/02/13 lap chole. LFTs subsequently normalized.   Black, tarry stools for 3 weeks.  She has chronic constipation and usually has a bowel movement twice a week. This is despite using daily MiraLAX and Linzess.  The stools are generally sticky, tarry and occur no more than once on the day when she has a bowel movement. She has no nausea, queasiness, anorexia. However for 6 days, she has had dysphagia. Mostly this is to solids and she is able to swallow mashed and liquid foods. However, even with the soft foods she may gag but they eventually will go down. She has never had problems with anemia nor with swallowing. Hgb baseline is 9-11.  Today it is 5.8.  PRBCs times 2 have been ordered. On December 3 her BUN was 42, today it is 19 Her pro time/INR are normal. She is using omeprazole 20 mg daily. Mobic on her med list is used at most 2-3 times monthly and she has not used any in at least 3 weeks.   Her husband is well informed as to her healthcare, he is her primary caregiver. He denies her having had nose bleeds, large hematomas or any other possible sources of blood loss.  01/2010 screening colonoscopy: normal. 10 year follow up schedule.   No previous  EGDs.     Past Medical History  Diagnosis Date  . Brain cancer     Frontal lobe, 1993 and 2005  . Migraines   . Morton neuroma   . Seizures   . Migraine   . Fibromyalgia   . Right fibular fracture 07/12/2011  . 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 Anemia 03/06/2013  . Secondary Parkinson disease     Past Surgical History  Procedure Laterality Date  . Appendectomy  1976  . Excision morton's neuroma      right foot  . Brain cancer      1993 and 2005  . Orif ankle fracture  07/12/2011    Procedure: OPEN REDUCTION INTERNAL FIXATION (ORIF) ANKLE FRACTURE;  Surgeon: Johnny Bridge, MD;  Location: [place];  Service: Orthopedics;  Laterality: Right;  . Cholecystectomy  08/02/2013    Procedure: LAPAROSCOPIC CHOLECYSTECTOMY;  Surgeon: Gwenyth Ober, MD;  Location: [place];  Service: General;;    Prior to Admission medications   Medication Sig Start Date End  Date Taking? Authorizing Provider  Calcium Citrate-Vitamin D (CITRACAL + D PO) Take 1 tablet by mouth daily. CA $RemoveB'1200mg'[id number]$ -Vitamin D 1000iu   Yes Historical Provider, MD  DULoxetine (CYMBALTA) 60 MG capsule Take 60 mg by mouth daily.   Yes Historical Provider, MD  fenofibrate 160 MG tablet Take 160 mg by mouth daily.   Yes Historical Provider, MD  fesoterodine (TOVIAZ) 8 MG TB24 tablet Take 8 mg by mouth daily.   Yes Historical Provider, MD  fish oil-omega-3 fatty acids 1000 MG capsule Take 1 g by mouth 2 (two) times daily.     Yes Historical Provider, MD  hydrocortisone-pramoxine (ANALPRAM HC) 2.5-1 % rectal cream Place 1 application rectally 3 (three) times daily. 12/08/13  Yes Midge Minium, MD  levETIRAcetam (KEPPRA) 500 MG tablet Take 1,500 mg by mouth 2 (two) times daily.    Yes Historical Provider, MD  Linaclotide Rolan Lipa) 145 MCG CAPS capsule Take 1 capsule (145 mcg total) by mouth daily. 06/07/13  Yes Midge Minium, MD  losartan-hydrochlorothiazide  (HYZAAR) 50-12.5 MG per tablet Take 1 tablet by mouth daily. 08/31/13  Yes Midge Minium, MD  LYRICA 150 MG capsule TAKE ONE CAPSULE BY MOUTH TWICE A DAY 11/14/13  Yes Midge Minium, MD  meloxicam (MOBIC) 15 MG tablet Take 15 mg by mouth daily.   Yes Historical Provider, MD  methocarbamol (ROBAXIN) 500 MG tablet Take 500 mg by mouth daily as needed for muscle spasms.   Yes Historical Provider, MD  nitrofurantoin (MACRODANTIN) 50 MG capsule Take 100 mg by mouth daily.   Yes Historical Provider, MD  omeprazole (PRILOSEC) 20 MG capsule TAKE 1 CAPSULE BY MOUTH DAILY 12/05/13  Yes Midge Minium, MD  promethazine (PHENERGAN) 25 MG tablet Take 25 mg by mouth every 6 (six) hours as needed for nausea or vomiting.   Yes Historical Provider, MD  rosuvastatin (CRESTOR) 10 MG tablet Take 10 mg by mouth every Monday, Wednesday, and Friday. Mon, Wed, Friday   Yes Historical Provider, MD  traZODone (DESYREL) 50 MG tablet Take 1 tablet (50 mg total) by mouth at bedtime. 12/13/13  Yes Midge Minium, MD  triamterene (DYRENIUM) 100 MG capsule Take 100 mg by mouth daily.   Yes Historical Provider, MD  HYDROcodone-acetaminophen (NORCO/VICODIN) 5-325 MG per tablet Take 1-2 tablets by mouth every 6 (six) hours as needed for moderate pain. Patient not taking: Reported on 12/27/2013 10/10/13   Meriam Sprague Saguier, PA-C    Scheduled Meds: .  stroke: mapping our early stages of recovery book   Does not apply Once  . pantoprazole (PROTONIX) IV  40 mg Intravenous Q12H   Infusions: . sodium chloride     PRN Meds: senna-docusate   Allergies as of 12/27/2013 - Review Complete 12/27/2013  Allergen Reaction Noted  . Versed [midazolam] Other (See Comments) and Anaphylaxis 07/13/2011  . Ciprofloxacin  09/18/2008  . Cyclobenzaprine Itching 03/10/2013  . Methocarbamol    . Metronidazole Other (See Comments) 01/11/2010  . Penicillins Rash 09/18/2008    Family History  Problem Relation Age of Onset  .  Diabetes Mother   . Diabetes Brother   . Hypertension Mother   . Hypertension Father     History   Social History  . Marital Status: Married    Spouse Name: N/A    Number of Children: N/A  . Years of Education: N/A   Occupational History  . Not on file.   Social History Main Topics  . Smoking status: Never  Smoker   . Smokeless tobacco: Never Used  . Alcohol Use: No  . Drug Use: No  . Sexual Activity: Not Currently   Other Topics Concern  . Not on file   Social History Narrative    REVIEW OF SYSTEMS: Constitutional:  Weight is stable. Strength does not seem to have been affected by her anemia. ENT:  No nose bleeds Pulm:  No cough, no dyspnea. CV:  No palpitations, no LE edema. No chest pain. GU:  No hematuria, no frequency.  Plus incontinence GI:  Per history of present illness Heme:  No problems with anemia in the past.   Transfusions:  Never transfused before. Neuro:  No headaches, no peripheral tingling or numbness Derm:  No itching, no rash or sores.  Endocrine:  No sweats or chills.  No polyuria or dysuria Immunization:  Vaccinations reviewed. Her zoster, influenza vaccinations are up-to-date. Travel:  None beyond local counties in last few months.    PHYSICAL EXAM: Vital signs in last 24 hours: Filed Vitals:   12/27/13 1427  BP: 105/69  Pulse: 81  Temp:   Resp: 20   Wt Readings from Last 3 Encounters:  08/31/13 204 lb 2 oz (92.59 kg)  08/12/13 202 lb (91.627 kg)  07/31/13 194 lb 14.4 oz (88.406 kg)    General: Pleasant, pale, non-ill appearing WF. Appears her stated age. Head:  No swelling, no facial asymmetry.  Eyes:  Conjunctiva pale, no scleral icterus. Ears:  No obvious hearing deficits.  Nose:  No congestion or nasal discharge Mouth:  Upper dentures in place. Mucosa is dry but clear and without exudate. Neck:  No JVD, no TMG, no masses. Lungs:  Lungs are clear to auscultation and percussion bilaterally. No cough. No labored  breathing Heart: RRR. No MRG. S1/S2 audible Abdomen:  Soft, active bowel sounds. NT, ND.  No HSM. No bruits. No hernias.   Rectal: Deferred.   Musc/Skeltl: No joint erythema, contractures or gross deformities. Extremities:  No pedal edema.  Neurologic:  Fluid though limited speech. No aphasia. Follows 3 step commands without difficulty.  Appropriately oriented 3. Alert and engaged. However many of the detailed questions were answered by her husband Skin:  No telangiectasia, rashes, sores Tattoos:  None Nodes:  No cervical adenopathy   Psych:  Pleasant, relaxed.  Intake/Output from previous day:   Intake/Output this shift:    LAB RESULTS:  Recent Labs  12/27/13 1113  WBC 4.3  HGB 5.8*  HCT 19.2*  PLT 230   MCV    83 BMET Lab Results  Component Value Date   NA 140 12/27/2013   NA 144 12/08/2013   NA 140 08/31/2013   K 3.7 12/27/2013   K 4.4 12/08/2013   K 4.4 08/31/2013   CL 105 12/27/2013   CL 111 12/08/2013   CL 108 08/31/2013   CO2 23 12/27/2013   CO2 15* 12/08/2013   CO2 24 08/31/2013   GLUCOSE 96 12/27/2013   GLUCOSE 89 12/08/2013   GLUCOSE 83 08/31/2013   BUN 19 12/27/2013   BUN 42* 12/08/2013   BUN 23 08/31/2013   CREATININE 1.57* 12/27/2013   CREATININE 1.6* 12/08/2013   CREATININE 1.0 08/31/2013   CALCIUM 9.9 12/27/2013   CALCIUM 9.6 12/08/2013   CALCIUM 9.5 08/31/2013   LFT No results for input(s): PROT, ALBUMIN, AST, ALT, ALKPHOS, BILITOT, BILIDIR, IBILI in the last 72 hours. PT/INR Lab Results  Component Value Date   INR 1.23 08/01/2013   INR 1.16 03/07/2013  INR 1.15 03/06/2013   Hepatitis Panel No results for input(s): HEPBSAG, HCVAB, HEPAIGM, HEPBIGM in the last 72 hours. C-Diff No components found for: CDIFF Lipase     Component Value Date/Time   LIPASE 18 07/31/2013 1000    Drugs of Abuse  No results found for: LABOPIA, COCAINSCRNUR, LABBENZ, AMPHETMU, THCU, LABBARB   RADIOLOGY STUDIES: Dg Chest 2 View  12/27/2013    CLINICAL DATA:  Cough.  Dysphasia and weakness.  EXAM: CHEST  2 VIEW  COMPARISON:  07/31/2013  FINDINGS: Lung volumes are low. Minimal left greater than right basilar atelectasis. The cardiomediastinal contours are normal. Pulmonary vasculature is normal. No consolidation, pleural effusion, or pneumothorax. No acute osseous abnormalities are seen.  IMPRESSION: Hypoventilatory chest with mild bibasilar atelectasis.   Electronically Signed   By: Jeb Levering M.D.   On: 12/27/2013 13:05   Mr Brain Wo Contrast  12/27/2013   CLINICAL DATA:  58 year old female with history of left frontal lobe tumor post resection. Migraine headaches. Presenting with dysphagia weakness for the past 6 days. Obesity. Hyperlipidemia. Initial encounter.  EXAM: MRI HEAD WITHOUT CONTRAST  TECHNIQUE: Multiplanar, multiecho pulse sequences of the brain and surrounding structures were obtained without intravenous contrast.  COMPARISON:  08/23/2005.  FINDINGS: Suggestion of tiny acute nonhemorrhagic infarct left periatrial region.  Post left frontal lobe craniotomy and resection of portion of the anterior left frontal lobe. When compared to the most recent exam available from 2007, there has been a change in appearance of the resection cavity with increase in lobulated septated cystic component, further dilation of the left lateral ventricle and increase in white matter changes throughout the hemispheres more notable on left. It is possible this represents post therapy changes however, correlation with more recent MR recommended to exclude the possibility of recurrent tumor.  Progressive atrophy.  Tiny regions of blood breakdown products probably postoperative/post therapy in origin.  Major intracranial vascular structures are patent.  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  Cervical medullary junction unremarkable.  Partially empty sella once again noted and felt to be an incidental finding.  Orbital structures unremarkable.  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  Post left frontal lobe craniotomy and resection of portion of the anterior left frontal lobe. When compared to the most recent exam available from 2007, there has been a change in appearance of the resection cavity with increase in lobulated septated cystic component, further dilation of the left lateral ventricle and increase in white matter changes throughout the hemispheres more notable on left. It is possible this represents post therapy changes however, correlation with more recent MR recommended to exclude the possibility of recurrent tumor.  Progressive atrophy.   Electronically Signed   By: Chauncey Cruel M.D.   On: 12/27/2013 13:18    ENDOSCOPIC STUDIES: Colonoscopy 2012 as per history of present illness.  IMPRESSION:   *  Acute anemia, MCV normal.  Black, tarry stools.  Rule out ulcer disease. Rule out AVM.  *  Recent onset, acute dysphagia. The sudden acuity of this problem weighs against esophageal dysmotility as well as stricture but these are part of the differential.  She is not diabetic and her glucoses are within normal range so the likelihood of Candida esophagitis is not high.  However she is on chronic suppressive antibiotics which can lead to Candida infection.  *  Brain cancer and seizures. Current MRI showing tiny left, periatrial, nonhemorrhagic infarct.  Per neuro this is an incidental finding and is not an  explanation for her new onset dysphagia.     PLAN:     *  Patient is to be transfused with 2 units packed red cells today.  We will arrange EGD tomorrow afternoon. Details of this, and the possibility of esophageal dilatation should we encounter a stricture, were discussed with the patient and her husband. They are hopeful that her anemia and dysphagia can be sorted out so that she might get to go home for Christmas in 2 days.   *  I went ahead and ordered a dysphagia 1 diet with thin liquids. The admitting  physician has already ordered speech language study.   Azucena Freed  12/27/2013, 2:35 PM Pager: [phone]  GI Attending Note   Chart was reviewed and patient was examined. X-rays and lab were reviewed.    I agree with management and plans. Sudden onset of dysphagia.  Dysphagia from strictures and dysmotility are usually more gradual in onset though should be r/oed, as well as candida esophagitis.  Pt has had a GI bleed.  She apparantly takes excedrin regularly for migraines.  Suspect PUD despite PPI use.  Plans as above.  Sandy Salaam. Deatra Ina, M.D., [facility] Cell [phone]

## 2013-12-28 NOTE — Progress Notes (Signed)
See EGD note.  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  No source for GI bleeding was seen.  Recommendations #1 hold further GI workup unless bleeding recurs #2 if dysphagia persists, proceed with barium swallow

## 2013-12-28 NOTE — Evaluation (Signed)
Physical Therapy Evaluation Patient Details Name: [name] MRN: [id number] DOB: December 25, 1955 Today's Date: 12/28/2013   History of Present Illness  Patient is a 58 y/o female with PMH of brain cancer status post frontal lobe resection in 1993 2005, seizure history,  migraines, fibromyalgia, secondary Parkinson's disease, followed by neurology at [facility], presents to the emergency room with a chief complaint of inability to swallow for the past week.  Pt endorses dark tarry stools for the past few weeks and increasing weakness. She also endorses cough with eating and a choking episode a few days ago. 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  Admitted with UTI, anemia s/p blood transfusion and CVA.    Clinical Impression  Patient presents with functional limitations due to deficits listed in PT problem list (see below). Pt with generalized weakness, balance deficits and impaired safety awareness impacting safe mobility. Pt seems to be functioning close to baseline based on reports from family. Pt is assist with all mobility and transfers and non ambulatory at baseline. Pt would benefit from skilled PT to improve bed mobility, transfers and safety so pt can ease burden of care and ensure safe return home.    Follow Up Recommendations Home health PT;Supervision/Assistance - 24 hour    Equipment Recommendations  None recommended by PT    Recommendations for Other Services OT consult     Precautions / Restrictions Precautions Precautions: Fall Restrictions Weight Bearing Restrictions: No      Mobility  Bed Mobility Overal bed mobility: Needs Assistance Bed Mobility: Supine to Sit;Sit to Supine     Supine to sit: Mod assist;HOB elevated Sit to supine: Total assist;+2 for physical assistance   General bed mobility comments: VC"s for sequencing to get to EOB- assist with BLEs, bottom and elevating trunk to get to EOB. Right lateral lean. Total A of 3  to return to supine due to pt sitting too early on EOB only landing half way.  Transfers Overall transfer level: Needs assistance Equipment used: None Transfers: Stand Pivot Transfers   Stand pivot transfers: Max assist;+2 physical assistance       General transfer comment: SPT bed<-> BSC Max A with pt having difficulty clearing BLEs. Requires constant cues and assist with weightshifting. Upon return to bed from [facility], pt sitting too early only half landing on EOB requiring assist of 3 to return to supine due to weakness and body habitus.  Ambulation/Gait                Stairs            Wheelchair Mobility    Modified Rankin (Stroke Patients Only) Modified Rankin (Stroke Patients Only) Pre-Morbid Rankin Score: Severe disability Modified Rankin: Severe disability     Balance Overall balance assessment: Needs assistance Sitting-balance support: Feet supported;Single extremity supported Sitting balance-Leahy Scale: Poor Sitting balance - Comments: Right lateral lean - not able to sit upright without Max A for support.  Postural control: Right lateral lean Standing balance support: During functional activity Standing balance-Leahy Scale: Zero Standing balance comment: Mod-Max A to stand for balance with BLEs locked out into knee extension.                             Pertinent Vitals/Pain Pain Assessment: No/denies pain    Home Living Family/patient expects to be discharged to:: Private residence Living Arrangements: Spouse/significant other Available Help at Discharge: Family;Available PRN/intermittently Type of  Home: House Home Access: Independence: One level Home Equipment: [place] - 2 wheels;Bedside commode;Wheelchair - manual;Shower seat      Prior Function Level of Independence: Needs assistance   Gait / Transfers Assistance Needed: Pt non ambulatory. Uses w/c for mobility. Assist with bed mobility and transfers at  baseline.  ADL's / Homemaking Assistance Needed: Per grand daughter pt is total A for ADLs. Spouse performs IADLs.  Comments: Per grand daughter, pt with STM deficits and difficulty with word finding over the last month.     Hand Dominance   Dominant Hand: Right    Extremity/Trunk Assessment   Upper Extremity Assessment: Defer to OT evaluation;Generalized weakness           Lower Extremity Assessment: Generalized weakness (Able to stand with BLEs locked out into knee extension for stability. )         Communication   Communication: Expressive difficulties  Cognition Arousal/Alertness: Awake/alert Behavior During Therapy: WFL for tasks assessed/performed Overall Cognitive Status: History of cognitive impairments - at baseline (Slow processing, decreased response time to questions. )       Memory: Decreased short-term memory              General Comments      Exercises        Assessment/Plan    PT Assessment Patient needs continued PT services  PT Diagnosis Generalized weakness;Difficulty walking   PT Problem List Decreased strength;Decreased cognition;Decreased activity tolerance;Decreased balance;Decreased mobility;Decreased safety awareness  PT Treatment Interventions Balance training;Neuromuscular re-education;Patient/family education;Functional mobility training;Therapeutic activities;Therapeutic exercise   PT Goals (Current goals can be found in the Care Plan section) Acute Rehab PT Goals Patient Stated Goal: none stated PT Goal Formulation: With patient Time For Goal Achievement: 01/11/14 Potential to Achieve Goals: Fair    Frequency Min 2X/week   Barriers to discharge        Co-evaluation               End of Session Equipment Utilized During Treatment: Gait belt Activity Tolerance: Patient tolerated treatment well Patient left: in bed;with call bell/phone within reach;with nursing/sitter in room (tech in room giving pt a bed upon PT  departure.) Nurse Communication: Mobility status (recommend bed pan for toileting.)         Time: 1055-1120 PT Time Calculation (min) (ACUTE ONLY): 25 min   Charges:   PT Evaluation $Initial PT Evaluation Tier I: 1 Procedure PT Treatments $Therapeutic Activity: 8-22 mins   PT G CodesCandy Sledge A 12/28/2013, 11:42 AM  Candy Sledge, PT, DPT [phone]

## 2013-12-28 NOTE — Interval H&P Note (Signed)
History and Physical Interval Note:  12/28/2013 3:24 PM  [name]  has presented today for surgery, with the diagnosis of Anemia, melena, solid food dysphagia  The various methods of treatment have been discussed with the patient and family. After consideration of risks, benefits and other options for treatment, the patient has consented to  Procedure(s): ESOPHAGOGASTRODUODENOSCOPY (EGD) (N/A) SAVORY DILATION (N/A) as a surgical intervention .  The patient's history has been reviewed, patient examined, no change in status, stable for surgery.  I have reviewed the patient's chart and labs.  Questions were answered to the patient's satisfaction.    The recent H&P (dated *12/27/13**) was reviewed, the patient was examined and there is no change in the patients condition since that H&P was completed.   Erskine Emery  12/28/2013, 3:24 PM    Erskine Emery

## 2013-12-28 NOTE — Progress Notes (Signed)
STROKE TEAM PROGRESS NOTE   HISTORY [name] is an 58 y.o. female PMH is significant for a left frontal low grade glioma resection in 1993. She developed seizures in 2000 and subsequently had a left temporal lobe resection in 2004. In 2006, she had full radiation treatment. Patient has been diagnosed with secondary parkinson's and is currently scheduled for DAT scan LP for possible hydrocephalus.  Per husband over the past three weeks she has been having black tarry stools and over the past 6 days he has noted she has been having difficulty swallowing solid foods. She is able to take liquids, mashed food and mashed potatoes with no difficulty. Patient denies any pain or sensation as if food is stuck. She just gags on food. She was recently seen by her neurologist Georgette Shell of [facility] on 12/22/2013 and at that time had no complaints of swallowing difficulty but husband states she had not mentioned it to him. Patient has no other complaints of weakness, numbness, HA, visual difficulty.   Neurology was asked to evaluate patient due to tiny acute nonhemorrhagic infarct left periatrial region  Date last known well: Date: 12/21/2013 Time last known well: Unable to determine tPA Given: No: out of window   SUBJECTIVE (INTERVAL HISTORY) Family or friends were at the bedside. Patient recounted the events that led to her hospitalization. She verified onset dysphagia was gradually over two weeks periods. She stated that she can not take solid food due to dysphagia, but able with liquid and soft diet. She stated to have dark stool 6 months ago. Following with [facility] for possible secondary parkinsonism.   OBJECTIVE Temp:  [97.8 F (36.6 C)-98.8 F (37.1 C)] 98 F (36.7 C) (12/24 0144) Pulse Rate:  [80-100] 89 (12/24 0144) Cardiac Rhythm:  [-]  Resp:  [10-20] 18 (12/24 0144) BP: (94-182)/(54-100) 100/57 mmHg (12/24 0144) SpO2:  [94 %-100 %] 99 % (12/24  0144)  No results for input(s): GLUCAP in the last 168 hours.  Recent Labs Lab 12/27/13 1113 12/28/13 0928  NA 140 140  K 3.7 3.9  CL 105 110  CO2 23 22  GLUCOSE 96 76  BUN 19 17  CREATININE 1.57* 1.36*  CALCIUM 9.9 8.8    Recent Labs Lab 12/28/13 0928  AST 20  ALT 10  ALKPHOS 30*  BILITOT 1.6*  PROT 6.0  ALBUMIN 3.1*    Recent Labs Lab 12/27/13 1113 12/28/13 0928 12/28/13 1825 12/29/13 0505  WBC 4.3 4.1 4.8 4.6  NEUTROABS 2.3  --   --   --   HGB 5.8* 9.0* 9.5* 8.7*  HCT 19.2* 27.9* 30.0* 27.3*  MCV 83.8 84.0 83.6 84.0  PLT 230 182 187 187   No results for input(s): CKTOTAL, CKMB, CKMBINDEX, TROPONINI in the last 168 hours. No results for input(s): LABPROT, INR in the last 72 hours. No results for input(s): COLORURINE, LABSPEC, [place], GLUCOSEU, HGBUR, BILIRUBINUR, KETONESUR, PROTEINUR, UROBILINOGEN, NITRITE, LEUKOCYTESUR in the last 72 hours.  Invalid input(s): APPERANCEUR     Component Value Date/Time   CHOL 110 12/28/2013 0928   TRIG 92 12/28/2013 0928   HDL 32* 12/28/2013 0928   CHOLHDL 3.4 12/28/2013 0928   VLDL 18 12/28/2013 0928   LDLCALC 60 12/28/2013 0928   Lab Results  Component Value Date   HGBA1C 5.5 12/28/2013   No results found for: LABOPIA, COCAINSCRNUR, LABBENZ, AMPHETMU, THCU, LABBARB  No results for input(s): ETH in the last 168 hours.  Dg Chest 2  View  12/27/2013   CLINICAL DATA:  Cough.  Dysphasia and weakness.  EXAM: CHEST  2 VIEW  COMPARISON:  07/31/2013  FINDINGS: Lung volumes are low. Minimal left greater than right basilar atelectasis. The cardiomediastinal contours are normal. Pulmonary vasculature is normal. No consolidation, pleural effusion, or pneumothorax. No acute osseous abnormalities are seen.  IMPRESSION: Hypoventilatory chest with mild bibasilar atelectasis.   Electronically Signed   By: Jeb Levering M.D.   On: 12/27/2013 13:05   Mr Jodene Nam Head Wo Contrast  12/27/2013   CLINICAL DATA:  Initial evaluation for  stroke.  EXAM: MRA HEAD WITHOUT CONTRAST  TECHNIQUE: Angiographic images of the Circle of Willis were obtained using MRA technique without intravenous contrast.  COMPARISON:  Prior MRI performed earlier on the same day.  FINDINGS: ANTERIOR CIRCULATION:  Visualized portions of the distal cervical segments of the internal carotid arteries are widely patent bilaterally. The petrous, cavernous, and supra clinoid segments are well opacified bilaterally without hemodynamically significant stenosis.  No proximal branch occlusion or hemodynamically significant stenosis identified within the M1 segments bilaterally. The distal left MCA branches are attenuated as compared to the right. This may in part be related to postoperative changes.  A1 segments, anterior communicating artery, and anterior cerebral arteries well opacified bilaterally. A2 segments within normal limits.  POSTERIOR CIRCULATION:  Vertebral arteries are codominant. Vertebrobasilar junction and basilar artery are widely patent and normal in appearance. The right anterior inferior cerebral artery is dominant. Superior cerebral arteries not well evaluated on this exam, with suggestion of multi focal atherosclerotic irregularity. P1 and P2 segments well opacified bilaterally. There is atherosclerotic irregularity within the distal right PCA.  No aneurysm or vascular malformation.  IMPRESSION: 1. No proximal branch occlusion or hemodynamically significant stenosis identified within the intracranial circulation. 2. Irregularity with attenuation of the distal left MCA branches as compared to the right. While this may in part be related to underlying mild atheromatous disease, finding suspected to largely be related to postoperative changes. 3. Mild multi focal atherosclerotic irregularity within the distal right PCA without focal high-grade stenosis.   Electronically Signed   By: Jeannine Boga M.D.   On: 12/27/2013 22:39   Mr Brain Wo  Contrast  12/27/2013   CLINICAL DATA:  58 year old female with history of left frontal lobe tumor post resection. Migraine headaches. Presenting with dysphagia weakness for the past 6 days. Obesity. Hyperlipidemia. Initial encounter.  EXAM: MRI HEAD WITHOUT CONTRAST  TECHNIQUE: Multiplanar, multiecho pulse sequences of the brain and surrounding structures were obtained without intravenous contrast.  COMPARISON:  08/23/2005.  FINDINGS: Suggestion of tiny acute nonhemorrhagic infarct left periatrial region.  Post left frontal lobe craniotomy and resection of portion of the anterior left frontal lobe. When compared to the most recent exam available from 2007, there has been a change in appearance of the resection cavity with increase in lobulated septated cystic component, further dilation of the left lateral ventricle and increase in white matter changes throughout the hemispheres more notable on left. It is possible this represents post therapy changes however, correlation with more recent MR recommended to exclude the possibility of recurrent tumor.  Progressive atrophy.  Tiny regions of blood breakdown products probably postoperative/post therapy in origin.  Major intracranial vascular structures are patent.  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  Cervical medullary junction unremarkable.  Partially empty sella once again noted and felt to be an incidental finding.  Orbital structures unremarkable.  IMPRESSION: Suggestion of tiny acute nonhemorrhagic infarct left periatrial region.  Post left  frontal lobe craniotomy and resection of portion of the anterior left frontal lobe. When compared to the most recent exam available from 2007, there has been a change in appearance of the resection cavity with increase in lobulated septated cystic component, further dilation of the left lateral ventricle and increase in white matter changes throughout the hemispheres more notable on left. It is possible this represents post therapy  changes however, correlation with more recent MR recommended to exclude the possibility of recurrent tumor.  Progressive atrophy.   Electronically Signed   By: Chauncey Cruel M.D.   On: 12/27/2013 13:18   Carotid Doppler  There is 1-39% bilateral ICA stenosis. Vertebral artery flow is antegrade.    2D Echocardiogram   - Left ventricle: The cavity size was normal. Systolic function wasnormal. The estimated ejection fraction was in the range of 55%to 60%. Wall motion was normal; there were no regional wallmotion abnormalities. Doppler parameters are consistent withabnormal left ventricular relaxation (grade 1 diastolicdysfunction). - Pulmonary arteries: PA peak pressure: 31 mm Hg (S). Impressions: - Normal LV function; grade 1 diastolic dysfunction; trace TR.  PHYSICAL EXAM  Temp:  [97.8 F (36.6 C)-98.8 F (37.1 C)] 98 F (36.7 C) (12/24 0144) Pulse Rate:  [80-100] 89 (12/24 0144) Resp:  [10-20] 18 (12/24 0144) BP: (94-182)/(54-100) 100/57 mmHg (12/24 0144) SpO2:  [94 %-100 %] 99 % (12/24 0144)  General - Well nourished, well developed, in no apparent distress.  Ophthalmologic - Sharp disc margins OU.  Cardiovascular - Regular rate and rhythm with no murmur.  Mental Status -  Level of arousal and orientation to time and person were intact, but not to place. Language including expression, naming, repetition, comprehension was assessed and found intact, however, significant psychomotor slowing and paucity of speech.  Cranial Nerves II - XII - II - Visual field intact OU. III, IV, VI - Extraocular movements intact. V - Facial sensation intact bilaterally. VII - Facial movement intact bilaterally. VIII - Hearing & vestibular intact bilaterally. X - Palate elevates symmetrically. XI - Chin turning & shoulder shrug intact bilaterally. XII - Tongue protrusion intact.  Motor Strength - The patient's strength was normal at all extremities except 4/5 RUE and pronator drift was  present on the right.  Bulk was normal and fasciculations were absent.   Motor Tone - Muscle tone was assessed at the neck and appendages and was normal.  Reflexes - The patient's reflexes were normal in all extremities and she had no pathological reflexes.  Sensory - Light touch, temperature/pinprick were assessed and were normal.    Coordination - The patient had normal movements in the hands and feet with no ataxia or dysmetria.  Tremor was absent.  Gait and Station - not tested due to fatigue.   ASSESSMENT/PLAN [name] is a 58 y.o. female with history of brain cancer status post frontal lobe resection in 1993 2005, seizure history, migraines, fibromyalgia, secondary Parkinson's disease, followed by neurology at [facility], presents with acute onset inability to swallow. She did not receive IV t-PA due to delay in arrival.   Stroke:  Dominant left small periatrial infarct, location not congruent with dysphagia. Infarct etiology unclear, likely related to her brain radiation which makes blood vessels fragile. Infarct is more consistent with incidental finding.  MRI  small left periatrial infarct  MRA  No large vessel significant stenosis. There is cerebral atherosclerosis.  Carotid Doppler  No significant stenosis   2D Echo  No source of embolus  HgbA1c 5.5  SCDs for VTE prophylaxis  DIET SOFT thin liquids  no antithrombotic prior to admission, now on no antithrombotic secondary to acute anemia status post transfusion. Once anemia stable, no active bleeding, please consider ASA 81mg  for stroke prevention.   Ongoing aggressive stroke risk factor management  Therapy recommendations:  HHPT,   Disposition:  Anticipated return home with therapy  Seizures, chronic  Secondary to brain tumor/Surgeries   Continue Keppra 1500 mg twice a day   Not likely the cause of sudden onset dysphagia  Hyperlipidemia  Home meds:  Omega-3 and Crestor and fenofibrate, resumed in  hospital  LDL 30, at goal < 70  Continue statin at discharge  Other Stroke Risk Factors  Obesity, Body mass index is 38.4 kg/(m^2).   Migraines  Other Active Problems  Acute anemia, s/p  Transfusion. Workup underway   Brain cancer with resultant seizures and Parkinsonism, s/p surgery 1993 and 2005. On Keppra 1500 mg twice a day, followed up in [facility] day # 2  Neurology will sign off. Please call with questions. Pt will follow up with Dr. Deboraha Sprang at [facility]. Thanks for the consult.  Rosalin Hawking, MD PhD Stroke Neurology 12/29/2013 6:03 AM   To contact Stroke Continuity provider, please refer to [email or web address] After hours, contact General Neurology

## 2013-12-28 NOTE — Op Note (Addendum)
[facility] [place], [address]   1ENDOSCOPY WITH DILATION PROCEDURE REPORT  PATIENT: [name], [name]  MR#: [id number] BIRTHDATE: 1955-01-27 , 47  yrs. old GENDER: female ENDOSCOPIST: Inda Castle, MD ASSISTANT:   Levora Angel, Romilda Garret REFERRED YO:MAYOKHTXH Birdie Riddle, M.D. PROCEDURE DATE:  Dec 30, 2013 PROCEDURE:   EGD w/ balloon dilation ASA CLASS:   Class III INDICATIONS:dysphagia and melena. MEDICATIONS: Fentanyl 75 mcg IV and Benadryl 25 mg IV TOPICAL ANESTHETIC:   Cetacaine Spray  DESCRIPTION OF PROCEDURE:   After the risks benefits and alternatives of the procedure were thoroughly explained, informed consent was obtained.  The [id number] ([id number])  endoscope was introduced through the mouth  and advanced to the second portion of the duodenum , limited by Without limitations.   The instrument was slowly withdrawn as the mucosa was carefully examined.    ESOPHAGUS: There was a peptic stricture at the gastroesophageal junction.  The stricture was easily traversable.     Otherwise normal EGD  Dilation was then performed at the gastroesophageal junction  Dilator:Balloon  #15?"16.5-18millimeter balloon dilators were inflated for 30 seconds each.Reststance:moderate Heme:none Appearance:adequate  There was moderate resistance to the 18 mm dilator only  COMPLICATIONS: There were no immediate complications.  ENDOSCOPIC IMPRESSION: 1.  early esophageal stricture?"status post balloon dilation 2.   Otherwise normal EGD  Symptoms of dysphagia are out of proportion to the severity of the peptic stricture.  No source for GI bleeding was seen.  RECOMMENDATIONS:   eSigned:  Inda Castle, MD December 30, 2013 4:17 PM  CC:  CPT CODES: ICD CODES:  The ICD and CPT codes recommended by this software are interpretations from the data that the clinical staff has captured with the software.  The verification of the  translation of this report to the ICD and CPT codes and modifiers is the sole responsibility of the health care institution and practicing physician where this report was generated.  [employer] will not be held responsible for the validity of the ICD and CPT codes included on this report.  AMA assumes no liability for data contained or not contained herein. CPT is a Designer, television/film set of the [employer].  DOCUMENT ADDENDUM eSigned:  Inda Castle, MD 30-Dec-2013 4:17 PM  Reason for addendum: [ ]  Correction of inaccurate information [ ]  Recently acquired lab/pathology results [x]  Additional information  Comments: Hold further GI workup for bleeding unless patient has recurrent overt bleeding. If dysphagia persists proceed with barium esophagram    PATIENT NAME:  [name], [name] MR#: [id number]

## 2013-12-28 NOTE — Progress Notes (Signed)
Bilateral carotid artery duplex completed:  1-39% ICA stenosis.  Vertebral artery flow is antegrade.     

## 2013-12-28 NOTE — Progress Notes (Signed)
PT Cancellation Note  Patient Details Name: [name] MRN: [id number] DOB: Feb 20, 1955   Cancelled Treatment:    Reason Eval/Treat Not Completed: Patient at procedure or test/unavailable pt off floor at procedure. Will follow up next available time.   Candy Sledge A 12/28/2013, 10:40 AM  Candy Sledge, PT, DPT [phone]

## 2013-12-28 NOTE — Progress Notes (Signed)
Utilization review completed. Yecenia Dalgleish, RN, BSN. 

## 2013-12-28 NOTE — Progress Notes (Signed)
  Echocardiogram 2D Echocardiogram has been performed.  Brix Brearley FRANCES 12/28/2013, 10:53 AM

## 2013-12-29 ENCOUNTER — Encounter (HOSPITAL_COMMUNITY): Payer: Self-pay | Admitting: Gastroenterology

## 2013-12-29 LAB — CBC
HCT: 27.3 % — ABNORMAL LOW (ref 36.0–46.0)
HEMOGLOBIN: 8.7 g/dL — AB (ref 12.0–15.0)
MCH: 26.8 pg (ref 26.0–34.0)
MCHC: 31.9 g/dL (ref 30.0–36.0)
MCV: 84 fL (ref 78.0–100.0)
PLATELETS: 187 10*3/uL (ref 150–400)
RBC: 3.25 MIL/uL — AB (ref 3.87–5.11)
RDW: 16.3 % — ABNORMAL HIGH (ref 11.5–15.5)
WBC: 4.6 10*3/uL (ref 4.0–10.5)

## 2013-12-29 LAB — BASIC METABOLIC PANEL
ANION GAP: 7 (ref 5–15)
BUN: 12 mg/dL (ref 6–23)
CALCIUM: 8.9 mg/dL (ref 8.4–10.5)
CO2: 23 mmol/L (ref 19–32)
Chloride: 109 mEq/L (ref 96–112)
Creatinine, Ser: 1.18 mg/dL — ABNORMAL HIGH (ref 0.50–1.10)
GFR calc Af Amer: 59 mL/min — ABNORMAL LOW (ref >90)
GFR calc non Af Amer: 51 mL/min — ABNORMAL LOW (ref >90)
GLUCOSE: 81 mg/dL (ref 70–99)
POTASSIUM: 4 mmol/L (ref 3.5–5.1)
SODIUM: 139 mmol/L (ref 135–145)

## 2013-12-29 LAB — TYPE AND SCREEN
ABO/RH(D): O POS
Antibody Screen: NEGATIVE
Unit division: 0
Unit division: 0

## 2013-12-29 MED ORDER — ASPIRIN 81 MG PO TBEC: 81.0000 mg | DELAYED_RELEASE_TABLET | Freq: Every day | ORAL | Status: DC

## 2013-12-29 MED ORDER — ASPIRIN EC 81 MG PO TBEC: 81.0000 mg | DELAYED_RELEASE_TABLET | Freq: Every day | ORAL | Status: DC

## 2013-12-29 NOTE — Discharge Summary (Addendum)
[facility]  Physician Discharge Summary   Patient ID: [name] MRN: [id number] DOB/AGE: 12-Apr-1955 58 y.o.  Admit date: 12/27/2013 Discharge date: 12/29/2013  PCP: Annye Asa, MD  DISCHARGE DIAGNOSES:  Principal Problem:   GI bleed Active Problems:   Anemia   Weakness   Secondary Parkinson disease   CVA (cerebral infarction)   Dysphagia, pharyngoesophageal phase   Dysphagia   Esophageal stricture   RECOMMENDATIONS FOR OUTPATIENT FOLLOW UP: 1. Started on aspirin. Instructed to watch closely for bleeding.  2. Refused home health PT 3. Will follow up with [facility]  DISCHARGE CONDITION: fair  Diet recommendation: Dys 3 diet  Filed Weights   12/27/13 1556  Weight: 95.255 kg (210 lb)    INITIAL HISTORY: 58 year old Caucasian female with a past medical history of brain cancer, status post frontal lobe resection in 1993, history of secondary Parkinson's disease who presented with difficulty swallowing for the past 1-2 weeks. She was also noted to have black tarry stools. She was profoundly anemic. She was subsequently admitted to the hospital. MRI was done because of concern for stroke, which showed a tiny acute infarct in the left periatrial region. She was subsequently admitted to the hospital for further workup.  Consultations:  Neurology and gastroenterology  Procedures: EGD 12/23 ENDOSCOPIC IMPRESSION: 1. Early esophageal stricture?"status post balloon dilation 2.Otherwise normal EGD  Carotid Doppler 1-39% ICA stenosis. Vertebral artery flow is antegrade.  2-D echocardiogram. Study Conclusions - Left ventricle: The cavity size was normal. Systolic function wasnormal. The estimated ejection fraction was in the range of 55%to 60%. Wall motion was normal; there were no regional wallmotion abnormalities. Doppler parameters are consistent withabnormal left ventricular relaxation (grade 1 diastolicdysfunction). - Pulmonary  arteries: PA peak pressure: 31 mm Hg (S). Impressions: - Normal LV function; grade 1 diastolic dysfunction; trace TR.  HOSPITAL COURSE:   Acute CVA Could be an incidental finding per neurology. Stroke workup was initiated. Carotid Dopplers and echocardiogram as above. No antiplatelet agents were given initially due to concern for GI bleeding. Okay to initiate 81 mg of aspirin now. She was seen by physical and occupational therapy. Home health PT was recommended. However, patient declines. LDL is 69.   Acute blood loss anemia Initial hemoglobin was 5.9. Patient reported black tarry stools. So anemia was likely due to a GI bleed. Patient with intermittent melena for the past 3-4 weeks. She was transfused 2 units of blood. Hemoglobin came up to around 9. Remained stable the following day.   Possible Melena Patient was seen by gastroenterology. She underwent EGD as discussed above. No clear source of bleeding has been noted. Her last colonoscopy was in 2012, which was unremarkable. She had a normal bowel movement this morning. She can follow up with Dr. Olevia Perches for further melena. Continue PPI.   Dysphagia Patient with signs and symptoms of aspiration at home and she reports coughing while eating. She has been seen by speech pathology. They're recommending a dysphagia 3 diet. EGD showed an early stricture which was dilated. However, it is unclear if that was what was causing her symptoms. Patient reports improvement in her swallowing function. She can follow up with gastroenterology if her symptoms recur. Unclear if her history of Parkinson's is playing a role as well.  Secondary Parkinson's disease She is being followed by neurology at [facility].   Seizure disorder She has not had a seizure in more than a year. Continue her home medications.   Fibromyalgia Continue her home medications  Hyperlipidemia Continue home medications  Overall, she remains stable. She feels better in terms of her  swallowing ability. She can be discharged safely.   PERTINENT LABS:  The results of significant diagnostics from this hospitalization (including imaging, microbiology, ancillary and laboratory) are listed below for reference.     Labs: Basic Metabolic Panel:  Recent Labs Lab 12/27/13 1113 12/28/13 0928 12/29/13 0505  NA 140 140 139  K 3.7 3.9 4.0  CL 105 110 109  CO2 23 22 23   GLUCOSE 96 76 81  BUN 19 17 12   CREATININE 1.57* 1.36* 1.18*  CALCIUM 9.9 8.8 8.9   Liver Function Tests:  Recent Labs Lab 12/28/13 0928  AST 20  ALT 10  ALKPHOS 30*  BILITOT 1.6*  PROT 6.0  ALBUMIN 3.1*   CBC:  Recent Labs Lab 12/27/13 1113 12/28/13 0928 12/28/13 1825 12/29/13 0505  WBC 4.3 4.1 4.8 4.6  NEUTROABS 2.3  --   --   --   HGB 5.8* 9.0* 9.5* 8.7*  HCT 19.2* 27.9* 30.0* 27.3*  MCV 83.8 84.0 83.6 84.0  PLT 230 182 187 187    IMAGING STUDIES Dg Chest 2 View  12/27/2013   CLINICAL DATA:  Cough.  Dysphasia and weakness.  EXAM: CHEST  2 VIEW  COMPARISON:  07/31/2013  FINDINGS: Lung volumes are low. Minimal left greater than right basilar atelectasis. The cardiomediastinal contours are normal. Pulmonary vasculature is normal. No consolidation, pleural effusion, or pneumothorax. No acute osseous abnormalities are seen.  IMPRESSION: Hypoventilatory chest with mild bibasilar atelectasis.   Electronically Signed   By: Jeb Levering M.D.   On: 12/27/2013 13:05   Mr Jodene Nam Head Wo Contrast  12/27/2013   CLINICAL DATA:  Initial evaluation for stroke.  EXAM: MRA HEAD WITHOUT CONTRAST  TECHNIQUE: Angiographic images of the Circle of Willis were obtained using MRA technique without intravenous contrast.  COMPARISON:  Prior MRI performed earlier on the same day.  FINDINGS: ANTERIOR CIRCULATION:  Visualized portions of the distal cervical segments of the internal carotid arteries are widely patent bilaterally. The petrous, cavernous, and supra clinoid segments are well opacified bilaterally  without hemodynamically significant stenosis.  No proximal branch occlusion or hemodynamically significant stenosis identified within the M1 segments bilaterally. The distal left MCA branches are attenuated as compared to the right. This may in part be related to postoperative changes.  A1 segments, anterior communicating artery, and anterior cerebral arteries well opacified bilaterally. A2 segments within normal limits.  POSTERIOR CIRCULATION:  Vertebral arteries are codominant. Vertebrobasilar junction and basilar artery are widely patent and normal in appearance. The right anterior inferior cerebral artery is dominant. Superior cerebral arteries not well evaluated on this exam, with suggestion of multi focal atherosclerotic irregularity. P1 and P2 segments well opacified bilaterally. There is atherosclerotic irregularity within the distal right PCA.  No aneurysm or vascular malformation.  IMPRESSION: 1. No proximal branch occlusion or hemodynamically significant stenosis identified within the intracranial circulation. 2. Irregularity with attenuation of the distal left MCA branches as compared to the right. While this may in part be related to underlying mild atheromatous disease, finding suspected to largely be related to postoperative changes. 3. Mild multi focal atherosclerotic irregularity within the distal right PCA without focal high-grade stenosis.   Electronically Signed   By: Jeannine Boga M.D.   On: 12/27/2013 22:39   Mr Brain Wo Contrast  12/27/2013   CLINICAL DATA:  58 year old female with history of left frontal lobe tumor post resection. Migraine headaches. Presenting  with dysphagia weakness for the past 6 days. Obesity. Hyperlipidemia. Initial encounter.  EXAM: MRI HEAD WITHOUT CONTRAST  TECHNIQUE: Multiplanar, multiecho pulse sequences of the brain and surrounding structures were obtained without intravenous contrast.  COMPARISON:  08/23/2005.  FINDINGS: Suggestion of tiny acute  nonhemorrhagic infarct left periatrial region.  Post left frontal lobe craniotomy and resection of portion of the anterior left frontal lobe. When compared to the most recent exam available from 2007, there has been a change in appearance of the resection cavity with increase in lobulated septated cystic component, further dilation of the left lateral ventricle and increase in white matter changes throughout the hemispheres more notable on left. It is possible this represents post therapy changes however, correlation with more recent MR recommended to exclude the possibility of recurrent tumor.  Progressive atrophy.  Tiny regions of blood breakdown products probably postoperative/post therapy in origin.  Major intracranial vascular structures are patent.  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  Cervical medullary junction unremarkable.  Partially empty sella once again noted and felt to be an incidental finding.  Orbital structures unremarkable.  IMPRESSION: Suggestion of tiny acute nonhemorrhagic infarct left periatrial region.  Post left frontal lobe craniotomy and resection of portion of the anterior left frontal lobe. When compared to the most recent exam available from 2007, there has been a change in appearance of the resection cavity with increase in lobulated septated cystic component, further dilation of the left lateral ventricle and increase in white matter changes throughout the hemispheres more notable on left. It is possible this represents post therapy changes however, correlation with more recent MR recommended to exclude the possibility of recurrent tumor.  Progressive atrophy.   Electronically Signed   By: Chauncey Cruel M.D.   On: 12/27/2013 13:18    DISCHARGE EXAMINATION: Filed Vitals:   12/29/13 0144 12/29/13 0704 12/29/13 0958 12/29/13 1426  BP: 100/57 112/66 106/68 130/73  Pulse: 89 77 89 75  Temp: 98 F (36.7 C) 97.4 F (36.3 C) 98.2 F (36.8 C) 98.4 F (36.9 C)  TempSrc: Oral Oral Oral Oral    Resp: 18 20 20 20   Height:      Weight:      SpO2: 99% 95% 98% 98%   General appearance: alert, cooperative, appears stated age and moderately obese Resp: clear to auscultation bilaterally Cardio: regular rate and rhythm, S1, S2 normal, no murmur, click, rub or gallop GI: soft, non-tender; bowel sounds normal; no masses,  no organomegaly  DISPOSITION: Home with husband  Discharge Instructions    Call MD for:  difficulty breathing, headache or visual disturbances    Complete by:  As directed      Call MD for:  extreme fatigue    Complete by:  As directed      Call MD for:  persistant dizziness or light-headedness    Complete by:  As directed      Call MD for:  persistant nausea and vomiting    Complete by:  As directed      Call MD for:  severe uncontrolled pain    Complete by:  As directed      Discharge diet:    Complete by:  As directed   Dysphagia 3 Diet.     Discharge instructions    Complete by:  As directed   Follow up with the neurologist at [facility] as scheduled. Watch for bleeding. Seek attention if you start bleeding again. If swallowing doesn't improve or gets worse, please call Dr. Nichola Sizer office.  Increase activity slowly    Complete by:  As directed            ALLERGIES:  Allergies  Allergen Reactions  . Versed [Midazolam] Other (See Comments) and Anaphylaxis    Severe respiratory depression, had to be masked in preop holding.  . Ciprofloxacin     REACTION: NAUSEA AND VOMITING  . Cyclobenzaprine Itching    Other reaction(s): Itching / Pruritis (ALLERGY/intolerance)  . Methocarbamol     REACTION: itching Patient currently tolerating it as its on her home med list.  . Metronidazole Other (See Comments)    Unknown   . Penicillins Rash    REACTION: RASH    Discharge Medication List as of 12/29/2013  3:33 PM    START taking these medications   Details  aspirin EC 81 MG EC tablet Take 1 tablet (81 mg total) by mouth daily., Starting 12/29/2013,  Until Discontinued, Print      CONTINUE these medications which have NOT CHANGED   Details  Calcium Citrate-Vitamin D (CITRACAL + D PO) Take 1 tablet by mouth daily. CA 1200mg -Vitamin D 1000iu, Until Discontinued, Historical Med    DULoxetine (CYMBALTA) 60 MG capsule Take 60 mg by mouth daily., Until Discontinued, Historical Med    fenofibrate 160 MG tablet Take 160 mg by mouth daily., Until Discontinued, Historical Med    fesoterodine (TOVIAZ) 8 MG TB24 tablet Take 8 mg by mouth daily., Until Discontinued, Historical Med    fish oil-omega-3 fatty acids 1000 MG capsule Take 1 g by mouth 2 (two) times daily.  , Until Discontinued, Historical Med    hydrocortisone-pramoxine (ANALPRAM HC) 2.5-1 % rectal cream Place 1 application rectally 3 (three) times daily., Starting 12/08/2013, Until Discontinued, Normal    levETIRAcetam (KEPPRA) 500 MG tablet Take 1,500 mg by mouth 2 (two) times daily. , Until Discontinued, Historical Med    Linaclotide (LINZESS) 145 MCG CAPS capsule Take 1 capsule (145 mcg total) by mouth daily., Starting 06/07/2013, Until Discontinued, Normal    losartan-hydrochlorothiazide (HYZAAR) 50-12.5 MG per tablet Take 1 tablet by mouth daily., Starting 08/31/2013, Until Discontinued, Normal    LYRICA 150 MG capsule TAKE ONE CAPSULE BY MOUTH TWICE A DAY, Print    meloxicam (MOBIC) 15 MG tablet Take 15 mg by mouth daily., Until Discontinued, Historical Med    methocarbamol (ROBAXIN) 500 MG tablet Take 500 mg by mouth daily as needed for muscle spasms., Until Discontinued, Historical Med    nitrofurantoin (MACRODANTIN) 50 MG capsule Take 100 mg by mouth daily., Until Discontinued, Historical Med    omeprazole (PRILOSEC) 20 MG capsule TAKE 1 CAPSULE BY MOUTH DAILY, Normal    promethazine (PHENERGAN) 25 MG tablet Take 25 mg by mouth every 6 (six) hours as needed for nausea or vomiting., Until Discontinued, Historical Med    rosuvastatin (CRESTOR) 10 MG tablet Take 10 mg by mouth  every Monday, Wednesday, and Friday. Mon, Wed, Friday, Until Discontinued, Historical Med    traZODone (DESYREL) 50 MG tablet Take 1 tablet (50 mg total) by mouth at bedtime., Starting 12/13/2013, Until Discontinued, Normal    triamterene (DYRENIUM) 100 MG capsule Take 100 mg by mouth daily., Until Discontinued, Historical Med    HYDROcodone-acetaminophen (NORCO/VICODIN) 5-325 MG per tablet Take 1-2 tablets by mouth every 6 (six) hours as needed for moderate pain., Starting 10/10/2013, Until Discontinued, Print       Follow-up Information    Follow up with Annye Asa, MD. Schedule an appointment as soon as possible for a  visit in 1 week.   Specialty:  Family Medicine   Why:  post hospitalization follow up   Contact information:   [address] [place] [address] [phone]       TOTAL DISCHARGE TIME: 35 mins.  [facility]  [facility] Pager [phone]  12/29/2013, 5:06 PM

## 2013-12-29 NOTE — Evaluation (Signed)
Speech Language Pathology Evaluation Patient Details Name: [name] MRN: [id number] DOB: March 10, 1955 Today's Date: 12/29/2013 Time: 6314-9702 SLP Time Calculation (min) (ACUTE ONLY): 24 min  Problem List:  Patient Active Problem List   Diagnosis Date Noted  . Esophageal stricture 12/28/2013  . GI bleed 12/27/2013  . CVA (cerebral infarction) 12/27/2013  . Dysphagia, pharyngoesophageal phase 12/27/2013  . Dysphagia   . Pressure ulcer stage II 12/08/2013  . Secondary Parkinson disease   . HTN (hypertension) 08/31/2013  . Postop check 08/12/2013  . Pyelonephritis 07/31/2013  . Hepatitis 07/31/2013  . Acute hepatitis 07/31/2013  . UTI (urinary tract infection) 03/06/2013  . Hypotension 03/06/2013  . Weakness of both legs 03/06/2013  . UTI (lower urinary tract infection) 03/06/2013  . Anemia 03/06/2013  . Weakness 03/06/2013  . Vaginal discharge 09/19/2011  . Right fibular fracture 07/12/2011  . Sinusitis, acute 03/14/2011  . Persistent disorder of initiating or maintaining sleep 03/14/2011  . Routine general medical examination at a health care facility 12/04/2010  . Screening for malignant neoplasm of the cervix 12/04/2010  . Migraine 08/30/2010  . Fibromyalgia 04/05/2010  . DEPRESSIVE DISORDER 11/27/2009  . MYALGIA 11/27/2009  . LEG CRAMPS 11/27/2009  . INSOMNIA-SLEEP DISORDER-UNSPEC 11/27/2009  . DYSURIA 08/27/2009  . FUNGAL DERMATITIS 07/16/2009  . OBESITY 07/16/2009  . RHINITIS 06/28/2009  . HYPERLIPIDEMIA 10/16/2008  . Malignant neoplasm of frontal lobe of brain 09/18/2008  . BACK PAIN, THORACIC REGION 09/18/2008   Past Medical History:  Past Medical History  Diagnosis Date  . Brain cancer     Frontal lobe, 1993 and 2005  . Migraines   . Morton neuroma   . Seizures   . Migraine   . Fibromyalgia   . Right fibular fracture 07/12/2011  . 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 Anemia  03/06/2013  . Secondary Parkinson disease    Past Surgical History:  Past Surgical History  Procedure Laterality Date  . Appendectomy  1976  . Excision morton's neuroma      right foot  . Brain cancer      1993 and 2005  . Orif ankle fracture  07/12/2011    Procedure: OPEN REDUCTION INTERNAL FIXATION (ORIF) ANKLE FRACTURE;  Surgeon: Johnny Bridge, MD;  Location: [place];  Service: Orthopedics;  Laterality: Right;  . Cholecystectomy  08/02/2013    Procedure: LAPAROSCOPIC CHOLECYSTECTOMY;  Surgeon: Gwenyth Ober, MD;  Location: [facility];  Service: General;;  . Esophagogastroduodenoscopy N/A 12/28/2013    Procedure: ESOPHAGOGASTRODUODENOSCOPY (EGD);  Surgeon: Inda Castle, MD;  Location: [place];  Service: Endoscopy;  Laterality: N/A;  . Savory dilation N/A 12/28/2013    Procedure: SAVORY DILATION;  Surgeon: Inda Castle, MD;  Location: [place];  Service: Endoscopy;  Laterality: N/A;   HPI:  [name] is a 58 y.o. female with PMH significant for a left frontal low grade glioma resection in 1993. She developed seizures in 2000 and subsequently had a left temporal lobe resection in 2004. In 2006, she had full radiation treatment. Patient has been diagnosed with secondary parkinson's and is currently scheduled for DAT scan LP for possible hydrocephalus. Pt presents with black tarry stooks over the past three weeks, and difficulty swallowing solid foods over the past week. MRI revealed acute infarct left periatrial region. Neurology and GI are following.   Assessment / Plan / Recommendation Clinical Impression  Pt is disoriented  to situation, although asking questions to better understand why she is her. Her processing time is slowed and sustained attention is impaired, requiring Mod cues from SLP for redirection to tasks. Pt has limited intellectual awareness of deficits, however again she is asking questions to better understand. Pt has anomia in conversation, which suspect is likely  due to the above as well as overall decreased cohesion of thoughts. Pt will benefit from skilled SLP services to maximize functional communication and cognition.     SLP Assessment  Patient needs continued Speech Lanaguage Pathology Services    Follow Up Recommendations  Home health SLP;24 hour supervision/assistance    Frequency and Duration min 2x/week  2 weeks   Pertinent Vitals/Pain Pain Assessment: No/denies pain   SLP Goals  Progression toward goals: Progressing toward goals Patient/Family Stated Goal: none stated Potential to Achieve Goals (ACUTE ONLY): Fair Potential Considerations (ACUTE ONLY): Previous level of function  SLP Evaluation Prior Functioning  Cognitive/Linguistic Baseline: Baseline deficits Baseline deficit details: husband reports a decline in cognition over the last few weeks Type of Home: House  Lives With: Spouse Available Help at Discharge: Family;Available PRN/intermittently   Cognition  Overall Cognitive Status: Impaired/Different from baseline Arousal/Alertness: Awake/alert Orientation Level: Oriented to person;Oriented to place;Oriented to time;Disoriented to situation Attention: Sustained Sustained Attention: Impaired Sustained Attention Impairment: Verbal basic;Functional basic Memory: Impaired Memory Impairment: Decreased recall of new information Awareness: Impaired Awareness Impairment: Intellectual impairment;Emergent impairment;Anticipatory impairment Problem Solving: Impaired Problem Solving Impairment: Functional basic;Verbal basic Safety/Judgment: Appears intact    Comprehension  Auditory Comprehension Overall Auditory Comprehension: Impaired Commands: Impaired One Step Basic Commands: 75-100% accurate Conversation: Simple Interfering Components: Attention;Processing speed;Working Field seismologist: Audiological scientist Discrimination: Within L-3 Communications Reading Comprehension Reading Status: Not tested    Expression Expression Primary Mode of Expression: Verbal Verbal Expression Overall Verbal Expression: Impaired Initiation: No impairment Automatic Speech: Name;Social Response Level of Generative/Spontaneous Verbalization: Sentence Naming: Impairment Other Naming Comments: anomia in conversation Pragmatics: Impairment Impairments: Abnormal affect;Monotone;Topic maintenance Interfering Components: Attention Non-Verbal Means of Communication: Not applicable Written Expression Dominant Hand: Right Written Expression: Not tested   Oral / Motor Oral Motor/Sensory Function Overall Oral Motor/Sensory Function: Appears within functional limits for tasks assessed Motor Speech Overall Motor Speech: Appears within functional limits for tasks assessed   GO      Germain Osgood, M.A. CCC-SLP [phone]  Germain Osgood 12/29/2013, 8:56 AM

## 2013-12-29 NOTE — Discharge Instructions (Signed)
Ischemic Stroke °A stroke (cerebrovascular accident) is the sudden death of brain tissue. It is a medical emergency. A stroke can cause permanent loss of brain function. This can cause problems with different parts of your body. A transient ischemic attack (TIA) is different because it does not cause permanent damage. A TIA is a short-lived problem of poor blood flow affecting a part of the brain. A TIA is also a serious problem because having a TIA greatly increases the chances of having a stroke. When symptoms first develop, you cannot know if the problem might be a stroke or a TIA. °CAUSES  °A stroke is caused by a decrease of oxygen supply to an area of your brain. It is usually the result of a small blood clot or collection of cholesterol or fat (plaque) that blocks blood flow in the brain. A stroke can also be caused by blocked or damaged carotid arteries.  °RISK FACTORS °· High blood pressure (hypertension). °· High cholesterol. °· Diabetes mellitus. °· Heart disease. °· The buildup of plaque in the blood vessels (peripheral artery disease or atherosclerosis). °· The buildup of plaque in the blood vessels providing blood and oxygen to the brain (carotid artery stenosis). °· An abnormal heart rhythm (atrial fibrillation). °· Obesity. °· Smoking. °· Taking oral contraceptives (especially in combination with smoking). °· Physical inactivity. °· A diet high in fats, salt (sodium), and calories. °· Alcohol use. °· Use of illegal drugs (especially cocaine and methamphetamine). °· Being African American. °· Being over the age of 55. °· Family history of stroke. °· Previous history of blood clots, stroke, TIA, or heart attack. °· Sickle cell disease. °SYMPTOMS  °These symptoms usually develop suddenly, or may be newly present upon awakening from sleep: °· Sudden weakness or numbness of the face, arm, or leg, especially on one side of the body. °· Sudden trouble walking or difficulty moving arms or legs. °· Sudden  confusion. °· Sudden personality changes. °· Trouble speaking (aphasia) or understanding. °· Difficulty swallowing. °· Sudden trouble seeing in one or both eyes. °· Double vision. °· Dizziness. °· Loss of balance or coordination. °· Sudden severe headache with no known cause. °· Trouble reading or writing. °DIAGNOSIS  °Your health care provider can often determine the presence or absence of a stroke based on your symptoms, history, and physical exam. Computed tomography (CT) of the brain is usually performed to confirm the stroke, determine causes, and determine stroke severity. Other tests may be done to find the cause of the stroke. These tests may include: °· Electrocardiography. °· Continuous heart monitoring. °· Echocardiography. °· Carotid ultrasonography. °· Magnetic resonance imaging (MRI). °· A scan of the brain circulation. °· Blood tests. °PREVENTION  °The risk of a stroke can be decreased by appropriately treating high blood pressure, high cholesterol, diabetes, heart disease, and obesity and by quitting smoking, limiting alcohol, and staying physically active. °TREATMENT  °Time is of the essence. It is important to seek treatment at the first sign of these symptoms because you may receive a medicine to dissolve the clot (thrombolytic) that cannot be given if too much time has passed since your symptoms began. Even if you do not know when your symptoms began, get treatment as soon as possible as there are other treatment options available including oxygen, intravenous (IV) fluids, and medicines to thin the blood (anticoagulants). Treatment of stroke depends on the duration, severity, and cause of your symptoms. Medicines and dietary changes may be used to address diabetes, high blood   pressure, and other risk factors. Physical, speech, and occupational therapists will assess you and work with you to improve any functions impaired by the stroke. Measures will be taken to prevent short-term and long-term  complications, including infection from breathing foreign material into the lungs (aspiration pneumonia), blood clots in the legs, bedsores, and falls. Rarely, surgery may be needed to remove large blood clots or to open up blocked arteries. °HOME CARE INSTRUCTIONS  °· Take medicines only as directed by your health care provider. Follow the directions carefully. Medicines may be used to control risk factors for a stroke. Be sure you understand all your medicine instructions. °· You may be told to take a medicine to thin the blood, such as aspirin or the anticoagulant warfarin. Warfarin needs to be taken exactly as instructed. °¨ Too much and too little warfarin are both dangerous. Too much warfarin increases the risk of bleeding. Too little warfarin continues to allow the risk for blood clots. While taking warfarin, you will need to have regular blood tests to measure your blood clotting time. These blood tests usually include both the PT and INR tests. The PT and INR results allow your health care provider to adjust your dose of warfarin. The dose can change for many reasons. It is critically important that you take warfarin exactly as prescribed, and that you have your PT and INR levels drawn exactly as directed. °¨ Many foods, especially foods high in vitamin K, can interfere with warfarin and affect the PT and INR results. Foods high in vitamin K include spinach, kale, broccoli, cabbage, collard and turnip greens, brussels sprouts, peas, cauliflower, seaweed, and parsley, as well as beef and pork liver, green tea, and soybean oil. You should eat a consistent amount of foods high in vitamin K. Avoid major changes in your diet, or notify your health care provider before changing your diet. Arrange a visit with a dietitian to answer your questions. °¨ Many medicines can interfere with warfarin and affect the PT and INR results. You must tell your health care provider about any and all medicines you take. This  includes all vitamins and supplements. Be especially cautious with aspirin and anti-inflammatory medicines. Do not take or discontinue any prescribed or over-the-counter medicine except on the advice of your health care provider or pharmacist. °¨ Warfarin can have side effects, such as excessive bruising or bleeding. You will need to hold pressure over cuts for longer than usual. Your health care provider or pharmacist will discuss other potential side effects. °¨ Avoid sports or activities that may cause injury or bleeding. °¨ Be mindful when shaving, flossing your teeth, or handling sharp objects. °¨ Alcohol can change the body's ability to handle warfarin. It is best to avoid alcoholic drinks or consume only very small amounts while taking warfarin. Notify your health care provider if you change your alcohol intake. °¨ Notify your dentist or other health care providers before procedures. °· If swallow studies have determined that your swallowing reflex is present, you should eat healthy foods. Including 5 or more servings of fruits and vegetables a day may reduce the risk of stroke. Foods may need to be a certain consistency (soft or pureed), or small bites may need to be taken in order to avoid aspirating or choking. Certain dietary changes may be advised to address high blood pressure, high cholesterol, diabetes, or obesity. °¨ Food choices that are low in sodium, saturated fat, trans fat, and cholesterol are recommended to manage high blood pressure. °¨   Food choies that are high in fiber, and low in saturated fat, trans fat, and cholesterol may control cholesterol levels.  Controlling carbohydrates and sugar intake is recommended to manage diabetes.  Reducing calorie intake and making food choices that are low in sodium, saturated fat, trans fat, and cholesterol are recommended to manage obesity.  Maintain a healthy weight.  Stay physically active. It is recommended that you get at least 30 minutes of  activity on all or most days.  Do not use any tobacco products including cigarettes, chewing tobacco, or electronic cigarettes.  Limit alcohol use even if you are not taking warfarin. Moderate alcohol use is considered to be:  No more than 2 drinks each day for men.  No more than 1 drink each day for nonpregnant women.  Home safety. A safe home environment is important to reduce the risk of falls. Your health care provider may arrange for specialists to evaluate your home. Having grab bars in the bedroom and bathroom is often important. Your health care provider may arrange for equipment to be used at home, such as raised toilets and a seat for the shower.  Physical, occupational, and speech therapy. Ongoing therapy may be needed to maximize your recovery after a stroke. If you have been advised to use a walker or a cane, use it at all times. Be sure to keep your therapy appointments.  Follow all instructions for follow-up with your health care provider. This is very important. This includes any referrals, physical therapy, rehabilitation, and lab tests. Proper follow-up can prevent another stroke from occurring. SEEK MEDICAL CARE IF:  You have personality changes.  You have difficulty swallowing.  You are seeing double.  You have dizziness.  You have a fever.  You have skin breakdown. SEEK IMMEDIATE MEDICAL CARE IF:  Any of these symptoms may represent a serious problem that is an emergency. Do not wait to see if the symptoms will go away. Get medical help right away. Call your local emergency services (911 in [place] Do not drive yourself to the hospital.  You have sudden weakness or numbness of the face, arm, or leg, especially on one side of the body.  You have sudden trouble walking or difficulty moving arms or legs.  You have sudden confusion.  You have trouble speaking (aphasia) or understanding.  You have sudden trouble seeing in one or both eyes.  You have a loss of  balance or coordination.  You have a sudden, severe headache with no known cause.  You have new chest pain or an irregular heartbeat.  You have a partial or total loss of consciousness. Document Released: 12/23/2004 Document Revised: 05/09/2013 Document Reviewed: 08/03/2011 [facility] Patient Information 2015 [place], [place]. This information is not intended to replace advice given to you by your health care provider. Make sure you discuss any questions you have with your health care provider.   Dysphagia Swallowing problems (dysphagia) occur when solids and liquids seem to stick in your throat on the way down to your stomach, or the food takes longer to get to the stomach. Other symptoms include regurgitating food, noises coming from the throat, chest discomfort with swallowing, and a feeling of fullness or the feeling of something being stuck in your throat when swallowing. When blockage in your throat is complete, it may be associated with drooling. CAUSES  Problems with swallowing may occur because of problems with the muscles. The food cannot be propelled in the usual manner into your stomach. You may  have ulcers, scar tissue, or inflammation in the tube down which food travels from your mouth to your stomach (esophagus), which blocks food from passing normally into the stomach. Causes of inflammation include:  Acid reflux from your stomach into your esophagus.  Infection.  Radiation treatment for cancer.  Medicines taken without enough fluids to wash them down into your stomach. You may have nerve problems that prevent signals from being sent to the muscles of your esophagus to contract and move your food down to your stomach. Globus pharyngeus is a relatively common problem in which there is a sense of an obstruction or difficulty in swallowing, without any physical abnormalities of the swallowing passages being found. This problem usually improves over time with reassurance and testing  to rule out other causes. DIAGNOSIS Dysphagia can be diagnosed and its cause can be determined by tests in which you swallow a white substance that helps illuminate the inside of your throat (contrast medium) while X-rays are taken. Sometimes a flexible telescope that is inserted down your throat (endoscopy) to look at your esophagus and stomach is used. TREATMENT   If the dysphagia is caused by acid reflux or infection, medicines may be used.  If the dysphagia is caused by problems with your swallowing muscles, swallowing therapy may be used to help you strengthen your swallowing muscles.  If the dysphagia is caused by a blockage or mass, procedures to remove the blockage may be done. HOME CARE INSTRUCTIONS  Try to eat soft food that is easier to swallow and check your weight on a daily basis to be sure that it is not decreasing.  Be sure to drink liquids when sitting upright (not lying down). SEEK MEDICAL CARE IF:  You are losing weight because you are unable to swallow.  You are coughing when you drink liquids (aspiration).  You are coughing up partially digested food. SEEK IMMEDIATE MEDICAL CARE IF:  You are unable to swallow your own saliva .  You are having shortness of breath or a fever, or both.  You have a hoarse voice along with difficulty swallowing. MAKE SURE YOU:  Understand these instructions.  Will watch your condition.  Will get help right away if you are not doing well or get worse. Document Released: 12/21/1999 Document Revised: 05/09/2013 Document Reviewed: 06/11/2012 [facility] Patient Information 2015 [place], [place]. This information is not intended to replace advice given to you by your health care provider. Make sure you discuss any questions you have with your health care provider.   Dysphagia Level 3 Diet, Mechanically Advanced The dysphagia level 3 diet includes foods that are soft, moist, and can be chopped into 1-inch chunks. This diet is helpful  for people with mild swallowing difficulties. It reduces the risk of food getting caught in the windpipe, trachea, or lungs. WHAT DO I NEED TO KNOW ABOUT THIS DIET?  You may eat foods that are soft and moist.  If you were on the dysphagia level 1 or level 2 diets, you may eat any of the foods included on those lists.  Avoid foods that are dry, hard, sticky, chewy, coarse, and crunchy. Also avoid large cuts of food.  Take small bites. Each bite should contain 1 inch or less of food.  Thicken liquids if instructed by your health care provider. Follow your health care provider's instructions on how to do this and to what consistency.  See your dietitian or speech language pathologist regularly for help with your dietary changes. WHAT FOODS CAN  I EAT? Grains Moist breads without nuts or seeds. Biscuits, muffins, pancakes, and waffles well-moistened with syrup, jelly, margarine, or butter. Smooth cereals with plenty of milk to moisten them. Moist bread stuffing. Moist rice. Vegetables All cooked, soft vegetables. Shredded lettuce. Tender fried potatoes. Fruits All canned and cooked fruits. Soft, peeled fresh fruits, such as peaches, nectarines, kiwis, cantaloupe, honeydew melon, and watermelon without seeds. Soft berries, such as strawberries. Meat and Other Protein Sources Moist ground or finely diced or sliced meats. Solid, tender cuts of meat. Meatloaf. Hamburger with a bun. Sausage patty. Deli thin-sliced lunch meat. Chicken, egg, or tuna salad sandwich. Sloppy joe. Moist fish. Eggs prepared any way. Casseroles with small chunks of meats, ground meats, or tender meats. Dairy Cheese spreads without coarse large chunks. Shredded cheese. Cheese slices. Cottage cheese. Milk at the right texture. Smooth frappes. Yogurt without nuts or coconut. Ask your health care provider whether you can have frozen desserts (such as malts or milk shakes) and thin liquids. Sweets/Desserts Soft, smooth, moist  desserts. Non-chewy, smooth candy. Jam. Jelly. Honey. Preserves. Ask your health care provider whether you can have frozen desserts. Fats and Oils Butter. Oils. Margarine. Mayonnaise. Gravy. Spreads. Other All seasonings and sweeteners. All sauces without large chunks. The items listed above may not be a complete list of recommended foods or beverages. Contact your dietitian for more options. WHAT FOODS ARE NOT RECOMMENDED? Grains Coarse or dry cereals. Dry breads. Toast. Crackers. Tough, crusty breads, such French bread and baguettes. Tough, crisp fried potatoes. Potato skins. Dry bread stuffing. Granola. Popcorn. Chips. Vegetables All raw vegetables except shredded lettuce. Cooked corn. Rubbery or stiff cooked vegetables. Stringy vegetables, such as celery. Fruits Hard fruits that are difficult to chew, such as apples or pears. Stringy, high-pulp fruits, such as pineapple, papaya, or mango. Fruits with tough skins, such as grapes. Coconut. All dried fruits. Fruit leather. Fruit roll-ups. Fruit snacks. Meat and Other Protein Sources Dry or tough meats or poultry. Dry fish. Fish with bones. Peanut butter. All nuts and seeds. Dairy  Any with nuts, seeds, chocolate chips, dried fruit, coconut, or pineapple. Sweets/Desserts Dry cakes. Chewy or dry cookies. Any with nuts, seeds, dry fruits, coconut, pineapple, or anything dry, sticky, or hard. Chewy caramel. Licorice. Taffy-type candies. Ask your health care provider whether you can have frozen desserts. Fats and Oils Any with chunks, nuts, seeds, or pineapple. Olives. Angie Fava. Other Soups with tough or large chunks of meats, poultry, or vegetables. Corn or clam chowder. The items listed above may not be a complete list of foods and beverages to avoid. Contact your dietitian for more information. Document Released: 12/23/2004 Document Revised: 08/25/2012 Document Reviewed: 12/06/2012 [facility] Patient Information 2015 [place], [place]. This  information is not intended to replace advice given to you by your health care provider. Make sure you discuss any questions you have with your health care provider.

## 2013-12-29 NOTE — Progress Notes (Signed)
Daily Rounding Note  12/29/2013, 8:18 AM  LOS: 2 days   SUBJECTIVE:       Pt says swallowing is improved.  SLP rec from today is to continue chopped dysphagia 3 diet.  Needs supervision and cuing for safe swallowing and compensating techniques per SLP. Husband showed me an internet based illustration of the dark stools observed at home.  These were maroon and formed, not melenic.   OBJECTIVE:         Vital signs in last 24 hours:    Temp:  [97.4 F (36.3 C)-98.8 F (37.1 C)] 97.4 F (36.3 C) (12/24 0704) Pulse Rate:  [77-100] 77 (12/24 0704) Resp:  [10-20] 20 (12/24 0704) BP: (100-182)/(54-100) 112/66 mmHg (12/24 0704) SpO2:  [94 %-100 %] 95 % (12/24 0704) Last BM Date: 12/26/13 Filed Weights   12/27/13 1556  Weight: 210 lb (95.255 kg)   General: pleasant, comfortable.  Masked faces   Heart: RRR Chest: clear bil.  Abdomen: soft, nt, nd.  Active bs  Extremities: no CCE Neuro/Psych:  Resting leg, arm tremors.  Slow, halting speech.   Intake/Output from previous day: 12/23 0701 - 12/24 0700 In: 200 [I.V.:200] Out: -   Intake/Output this shift:    Lab Results:  Recent Labs  12/28/13 0928 12/28/13 1825 12/29/13 0505  WBC 4.1 4.8 4.6  HGB 9.0* 9.5* 8.7*  HCT 27.9* 30.0* 27.3*  PLT 182 187 187   BMET  Recent Labs  12/27/13 1113 12/28/13 0928 12/29/13 0505  NA 140 140 139  K 3.7 3.9 4.0  CL 105 110 109  CO2 23 22 23   GLUCOSE 96 76 81  BUN 19 17 12   CREATININE 1.57* 1.36* 1.18*  CALCIUM 9.9 8.8 8.9   LFT  Recent Labs  12/28/13 0928  PROT 6.0  ALBUMIN 3.1*  AST 20  ALT 10  ALKPHOS 30*  BILITOT 1.6*   PT/INR No results for input(s): LABPROT, INR in the last 72 hours. Hepatitis Panel No results for input(s): HEPBSAG, HCVAB, HEPAIGM, HEPBIGM in the last 72 hours.  Studies/Results: Dg Chest 2 View  12/27/2013   CLINICAL DATA:  Cough.  Dysphasia and weakness.  EXAM: CHEST  2 VIEW   COMPARISON:  07/31/2013  FINDINGS: Lung volumes are low. Minimal left greater than right basilar atelectasis. The cardiomediastinal contours are normal. Pulmonary vasculature is normal. No consolidation, pleural effusion, or pneumothorax. No acute osseous abnormalities are seen.  IMPRESSION: Hypoventilatory chest with mild bibasilar atelectasis.   Electronically Signed   By: Jeb Levering M.D.   On: 12/27/2013 13:05   Mr Jodene Nam Head Wo Contrast  12/27/2013   CLINICAL DATA:  Initial evaluation for stroke.  EXAM: MRA HEAD WITHOUT CONTRAST  TECHNIQUE: Angiographic images of the Circle of Willis were obtained using MRA technique without intravenous contrast.  COMPARISON:  Prior MRI performed earlier on the same day.  FINDINGS: ANTERIOR CIRCULATION:  Visualized portions of the distal cervical segments of the internal carotid arteries are widely patent bilaterally. The petrous, cavernous, and supra clinoid segments are well opacified bilaterally without hemodynamically significant stenosis.  No proximal branch occlusion or hemodynamically significant stenosis identified within the M1 segments bilaterally. The distal left MCA branches are attenuated as compared to the right. This may in part be related to postoperative changes.  A1 segments, anterior communicating artery, and anterior cerebral arteries well opacified bilaterally. A2 segments within normal limits.  POSTERIOR CIRCULATION:  Vertebral arteries are codominant. Vertebrobasilar junction  and basilar artery are widely patent and normal in appearance. The right anterior inferior cerebral artery is dominant. Superior cerebral arteries not well evaluated on this exam, with suggestion of multi focal atherosclerotic irregularity. P1 and P2 segments well opacified bilaterally. There is atherosclerotic irregularity within the distal right PCA.  No aneurysm or vascular malformation.  IMPRESSION: 1. No proximal branch occlusion or hemodynamically significant stenosis  identified within the intracranial circulation. 2. Irregularity with attenuation of the distal left MCA branches as compared to the right. While this may in part be related to underlying mild atheromatous disease, finding suspected to largely be related to postoperative changes. 3. Mild multi focal atherosclerotic irregularity within the distal right PCA without focal high-grade stenosis.   Electronically Signed   By: Jeannine Boga M.D.   On: 12/27/2013 22:39   Mr Brain Wo Contrast  12/27/2013   CLINICAL DATA:  58 year old female with history of left frontal lobe tumor post resection. Migraine headaches. Presenting with dysphagia weakness for the past 6 days. Obesity. Hyperlipidemia. Initial encounter.  EXAM: MRI HEAD WITHOUT CONTRAST  TECHNIQUE: Multiplanar, multiecho pulse sequences of the brain and surrounding structures were obtained without intravenous contrast.  COMPARISON:  08/23/2005.  FINDINGS: Suggestion of tiny acute nonhemorrhagic infarct left periatrial region.  Post left frontal lobe craniotomy and resection of portion of the anterior left frontal lobe. When compared to the most recent exam available from 2007, there has been a change in appearance of the resection cavity with increase in lobulated septated cystic component, further dilation of the left lateral ventricle and increase in white matter changes throughout the hemispheres more notable on left. It is possible this represents post therapy changes however, correlation with more recent MR recommended to exclude the possibility of recurrent tumor.  Progressive atrophy.  Tiny regions of blood breakdown products probably postoperative/post therapy in origin.  Major intracranial vascular structures are patent.  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  Cervical medullary junction unremarkable.  Partially empty sella once again noted and felt to be an incidental finding.  Orbital structures unremarkable.  IMPRESSION: Suggestion of tiny acute  nonhemorrhagic infarct left periatrial region.  Post left frontal lobe craniotomy and resection of portion of the anterior left frontal lobe. When compared to the most recent exam available from 2007, there has been a change in appearance of the resection cavity with increase in lobulated septated cystic component, further dilation of the left lateral ventricle and increase in white matter changes throughout the hemispheres more notable on left. It is possible this represents post therapy changes however, correlation with more recent MR recommended to exclude the possibility of recurrent tumor.  Progressive atrophy.   Electronically Signed   By: Chauncey Cruel M.D.   On: 12/27/2013 13:18   Scheduled Meds: . sodium chloride   Intravenous Once  . DULoxetine  60 mg Oral Daily  . fenofibrate  160 mg Oral Daily  . fesoterodine  8 mg Oral Daily  . hydrocortisone-pramoxine  1 applicator Rectal TID  . levETIRAcetam  1,500 mg Oral BID  . Linaclotide  145 mcg Oral Daily  . nitrofurantoin  100 mg Oral Daily  . pantoprazole (PROTONIX) IV  40 mg Intravenous Q12H  . pregabalin  150 mg Oral BID  . rosuvastatin  10 mg Oral Q M,W,F  . traZODone  50 mg Oral QHS   Continuous Infusions:  PRN Meds:.HYDROcodone-acetaminophen, methocarbamol, promethazine, senna-docusate   ASSESMENT:   * Acute anemia, MCV normal. Maroon, formed stools.  Hemoglobin much improved following transfusion  with PRBCs x 2. 2012 screening colonoscopy: normal.  EGD 12/28/13: dilation of early, small esophageal stricture (not likely cause of acute dysphagia), o/w normal.   * Recent onset, acute dysphagia.  EGD 12/23 as above.  Swallowing somewhat improved so far. On D 3 diet.    * Brain cancer and seizures. Current MRI showing tiny left, periatrial, nonhemorrhagic infarct. Per neuro this is an incidental finding and is not an explanation for her new onset dysphagia.also raises question of recurrent tumor vs radiation changes in area  of previous tumor resection on left.   *  Secondary parkinson's.  This may be a source of her dysphagia.    PLAN   *  Agree with SLP recs re diet and swallowing.  Consider barium esophagram if ongoing/progressive dysphagia. *  Hold further GI workup for bleeding unless patient has recurrent overt bleeding.  At that time could repeat colonoscopy or pursue capsule endo.  Delfin Edis is her GI MD.    GI Attending Note  I have personally taken an interval history, reviewed the chart, and examined the patient.  I agree with the extender's note, impression and recommendations.  Sandy Salaam. Deatra Ina, MD, [facility] [phone]  *  Will sign off.     Azucena Freed  12/29/2013, 8:18 AM Pager: [phone]

## 2013-12-29 NOTE — Progress Notes (Signed)
Speech Language Pathology Treatment: Dysphagia  Patient Details Name: [name] MRN: [id number] DOB: 02-08-1955 Today's Date: 12/29/2013 Time: 1497-0263 SLP Time Calculation (min) (ACUTE ONLY): 10 min  Assessment / Plan / Recommendation Clinical Impression  Note patient has been advanced to soft diet s/p EGD with stretching at GE junction. SLP provided skilled observation throughout breakfast meal with immediate throat clear x1. SLP provided verbal cue x1 for smaller bites and assisted with set-up to cut foods into more bite-sized pieces. Recommend to continue on current diet with intermittent supervision.   HPI HPI: [name] is a 58 y.o. female with PMH significant for a left frontal low grade glioma resection in 1993. She developed seizures in 2000 and subsequently had a left temporal lobe resection in 2004. In 2006, she had full radiation treatment. Patient has been diagnosed with secondary parkinson's and is currently scheduled for DAT scan LP for possible hydrocephalus. Pt presents with black tarry stooks over the past three weeks, and difficulty swallowing solid foods over the past week. MRI revealed acute infarct left periatrial region. Neurology and GI are following.   Pertinent Vitals Pain Assessment: No/denies pain  SLP Plan  Continue with current plan of care    Recommendations Diet recommendations: Dysphagia 3 (mechanical soft);Thin liquid Liquids provided via: Cup;Straw Medication Administration: Crushed with puree Supervision: Patient able to self feed;Full supervision/cueing for compensatory strategies Compensations: Slow rate;Small sips/bites;Follow solids with liquid Postural Changes and/or Swallow Maneuvers: Seated upright 90 degrees;Upright 30-60 min after meal              Oral Care Recommendations: Oral care BID Follow up Recommendations: None Plan: Continue with current plan of care    GO      Germain Osgood, M.A. CCC-SLP [phone]  Germain Osgood 12/29/2013, 8:46 AM

## 2013-12-29 NOTE — Telephone Encounter (Signed)
Patient admitted

## 2013-12-29 NOTE — Progress Notes (Signed)
Reviewed all discharge documents and provided stroke prevention education with patient and husband. Both verbalized understanding. IV, telemetry dcd. Patient will follow up with Neurology and PCP. One Rx provided. Pt was transported to the front entrance via wheelchair at 1650 and husband provided transportation home.

## 2013-12-29 NOTE — Progress Notes (Signed)
Physical Therapy Treatment Patient Details Name: [name] MRN: [id number] DOB: February 15, 1955 Today's Date: 12/29/2013    History of Present Illness Patient is a 58 y/o female with PMH of brain cancer status post frontal lobe resection in 1993 2005, seizure history,  migraines, fibromyalgia, secondary Parkinson's disease, followed by neurology at [facility], presents to the emergency room with a chief complaint of inability to swallow for the past week.  Pt endorses dark tarry stools for the past few weeks and increasing weakness. She also endorses cough with eating and a choking episode a few days ago. 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  Admitted with UTI, anemia s/p blood transfusion and CVA.    PT Comments    Patient is planning to DC home today with assist from husband. See OT note. Patient is back to baseline for mobility.   Follow Up Recommendations  Home health PT;Supervision/Assistance - 24 hour     Equipment Recommendations  None recommended by PT    Recommendations for Other Services       Precautions / Restrictions Precautions Precautions: Fall Restrictions Weight Bearing Restrictions: No    Mobility  Bed Mobility Overal bed mobility: Needs Assistance Bed Mobility: Supine to Sit     Supine to sit: Mod assist Sit to supine: Mod assist   General bed mobility comments: A for trunk support.   Transfers Overall transfer level: Needs assistance Equipment used: 1 person hand held assist Transfers: Sit to/from [employer] Sit to Stand: Mod assist Stand pivot transfers: Mod assist       General transfer comment: USe of stedy as unsure of patients balance from previous session. Husband able to complete with patient during OT session (see OT note)  Ambulation/Gait                 Stairs            Wheelchair Mobility    Modified Rankin (Stroke Patients Only) Modified Rankin (Stroke Patients  Only) Pre-Morbid Rankin Score: Severe disability Modified Rankin: Severe disability     Balance Overall balance assessment: Needs assistance;History of Falls                                  Cognition Arousal/Alertness: Awake/alert Behavior During Therapy: Flat affect Overall Cognitive Status: History of cognitive impairments - at baseline       Memory: Decreased short-term memory              Exercises      General Comments        Pertinent Vitals/Pain Pain Assessment: No/denies pain    Home Living Family/patient expects to be discharged to:: Private residence Living Arrangements: Spouse/significant other Available Help at Discharge: Family;Available PRN/intermittently Type of Home: House Home Access: Ramped entrance   Home Layout: One level Home Equipment: Walker - 2 wheels;Bedside commode;Wheelchair - manual;Shower seat Additional Comments: pt is poor historian. Spouse reports: transfer to 3n1 in bedroom and fatigue of spouse due to L UE use during peri care. spouse educated on using w/c to help with anterior weight shift.    Prior Function Level of Independence: Needs assistance    ADL's / Homemaking Assistance Needed: total (A) in bathroom on shower seat. Spouse reports x1 fall due to warm temperature very warm.      PT Goals (current goals can now be found in the care plan section) Progress  towards PT goals: Progressing toward goals    Frequency  Min 2X/week    PT Plan Current plan remains appropriate    Co-evaluation             End of Session Equipment Utilized During Treatment: Gait belt Activity Tolerance: Patient tolerated treatment well Patient left: in chair;with call bell/phone within reach     Time: 0900-0930 PT Time Calculation (min) (ACUTE ONLY): 30 min  Charges:  $Therapeutic Activity: 23-37 mins                    G Codes:      Jacqualyn Posey 12/29/2013, 12:45 PM 12/29/2013 Jacqualyn Posey PTA [phone] pager [phone] office

## 2013-12-29 NOTE — Evaluation (Signed)
Occupational Therapy Evaluation Patient Details Name: [name] MRN: [id number] DOB: 05-04-1955 Today's Date: 12/29/2013    History of Present Illness Patient is a 58 y/o female with PMH of brain cancer status post frontal lobe resection in 1993 2005, seizure history,  migraines, fibromyalgia, secondary Parkinson's disease, followed by neurology at [facility], presents to the emergency room with a chief complaint of inability to swallow for the past week.  Pt endorses dark tarry stools for the past few weeks and increasing weakness. She also endorses cough with eating and a choking episode a few days ago. 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  Admitted with UTI, anemia s/p blood transfusion and CVA.   Clinical Impression   Patient evaluated by Occupational Therapy with no further acute OT needs identified. All education has been completed and the patient has no further questions. See below for any follow-up Occupational Therapy or equipment needs. OT to sign off. Thank you for referral.   Spouse present and return demonstrates transfers. Spouse feels patient is near baseline and does not want therapy follow up at this time. Ot to sign off acutely. NO dme needs. Pt has all necessary equipment.     Follow Up Recommendations  No OT follow up    Equipment Recommendations  None recommended by OT    Recommendations for Other Services       Precautions / Restrictions Precautions Precautions: Fall Restrictions Weight Bearing Restrictions: No      Mobility Bed Mobility Overal bed mobility: Needs Assistance Bed Mobility: Supine to Sit;Sit to Supine     Supine to sit: Mod assist Sit to supine: Mod assist   General bed mobility comments: spouse completed with patient at MOD I level for the two of them.  Transfers Overall transfer level: Needs assistance Equipment used: 1 person hand held assist Transfers: Sit to/from [employer] Sit to Stand: Mod assist Stand pivot transfers: Mod assist       General transfer comment: pt and spouse able to complete mod I ( mod (A) required from spouse)    Balance Overall balance assessment: Needs assistance;History of Falls                                          ADL Overall ADL's : At baseline                                       General ADL Comments: spouse present and completed chair to bed, sit<>s upine, Supine<>Sit and EOB to chair transfer. spouse reports this is about normal. Spouse reports pending work up at [facility] for [employer] dx. He wants to await on any therapy until after testing at [facility] further skilled Ot needs     Vision                     Perception     Praxis      Pertinent Vitals/Pain Pain Assessment: No/denies pain     Hand Dominance Right   Extremity/Trunk Assessment Upper Extremity Assessment Upper Extremity Assessment: Overall WFL for tasks assessed   Lower Extremity Assessment Lower Extremity Assessment: Defer to PT evaluation   Cervical / Trunk Assessment Cervical / Trunk Assessment: Kyphotic   Communication Communication Communication: Expressive difficulties  Cognition Arousal/Alertness: Awake/alert Behavior During Therapy: Flat affect Overall Cognitive Status: History of cognitive impairments - at baseline                     General Comments       Exercises       Shoulder Instructions      Home Living Family/patient expects to be discharged to:: Private residence Living Arrangements: Spouse/significant other Available Help at Discharge: Family;Available PRN/intermittently Type of Home: House Home Access: Ramped entrance     Home Layout: One level     Bathroom Shower/Tub: Walk-in Hydrologist: Standard     Home Equipment: Environmental consultant - 2 wheels;Bedside commode;Wheelchair - manual;Shower seat   Additional Comments: pt  is poor historian. Spouse reports: transfer to 3n1 in bedroom and fatigue of spouse due to L UE use during peri care. spouse educated on using w/c to help with anterior weight shift.  Lives With: Spouse    Prior Functioning/Environment Level of Independence: Needs assistance    ADL's / Homemaking Assistance Needed: total (A) in bathroom on shower seat. Spouse reports x1 fall due to warm temperature very warm.         OT Diagnosis: Generalized weakness   OT Problem List:     OT Treatment/Interventions:      OT Goals(Current goals can be found in the care plan section)    OT Frequency:     Barriers to D/C:            Co-evaluation              End of Session Equipment Utilized During Treatment: Gait belt Nurse Communication: Mobility status;Precautions  Activity Tolerance: Patient tolerated treatment well Patient left: in bed;with call bell/phone within reach;with family/visitor present   Time: 6761-9509 OT Time Calculation (min): 25 min Charges:  OT General Charges $OT Visit: 1 Procedure OT Evaluation $Initial OT Evaluation Tier I: 1 Procedure OT Treatments $Self Care/Home Management : 8-22 mins G-Codes:    Peri Maris 01-04-14, 10:32 AM Pager: [phone]

## 2014-01-04 ENCOUNTER — Telehealth: Payer: Self-pay

## 2014-01-04 NOTE — Telephone Encounter (Signed)
Admit date: 12/27/2013 Discharge date: 12/29/2013  Reason for admission:  GI Bleed  Unable to reach patient or leave a voice message.  Voice mail box not set up.

## 2014-01-18 ENCOUNTER — Ambulatory Visit (INDEPENDENT_AMBULATORY_CARE_PROVIDER_SITE_OTHER): Payer: 59 | Admitting: Family Medicine

## 2014-01-18 ENCOUNTER — Encounter: Payer: Self-pay | Admitting: Family Medicine

## 2014-01-18 VITALS — BP 120/64 | HR 69 | Temp 97.3°F | Resp 16

## 2014-01-18 DIAGNOSIS — I63312 Cerebral infarction due to thrombosis of left middle cerebral artery: Secondary | ICD-10-CM

## 2014-01-18 DIAGNOSIS — R1314 Dysphagia, pharyngoesophageal phase: Secondary | ICD-10-CM

## 2014-01-18 DIAGNOSIS — D509 Iron deficiency anemia, unspecified: Secondary | ICD-10-CM

## 2014-01-18 LAB — CBC WITH DIFFERENTIAL/PLATELET
BASOS ABS: 0.1 10*3/uL (ref 0.0–0.1)
Basophils Relative: 1.2 % (ref 0.0–3.0)
Eosinophils Absolute: 0.2 10*3/uL (ref 0.0–0.7)
Eosinophils Relative: 4.3 % (ref 0.0–5.0)
HCT: 35.1 % — ABNORMAL LOW (ref 36.0–46.0)
Hemoglobin: 11.1 g/dL — ABNORMAL LOW (ref 12.0–15.0)
LYMPHS PCT: 35.9 % (ref 12.0–46.0)
Lymphs Abs: 1.6 10*3/uL (ref 0.7–4.0)
MCHC: 31.5 g/dL (ref 30.0–36.0)
MCV: 86.7 fl (ref 78.0–100.0)
Monocytes Absolute: 0.4 10*3/uL (ref 0.1–1.0)
Monocytes Relative: 8.6 % (ref 3.0–12.0)
Neutro Abs: 2.2 10*3/uL (ref 1.4–7.7)
Neutrophils Relative %: 50 % (ref 43.0–77.0)
PLATELETS: 255 10*3/uL (ref 150.0–400.0)
RBC: 4.05 Mil/uL (ref 3.87–5.11)
RDW: 18.4 % — AB (ref 11.5–15.5)
WBC: 4.4 10*3/uL (ref 4.0–10.5)

## 2014-01-18 NOTE — Assessment & Plan Note (Signed)
Pt w/ blood loss anemia w/ Hgb at time of admission in the 5's.  Pt received 2 units PRBCs and Hgb improved.  Pt denies any signs of bleeding since d/c.  Repeat CBC to assess.

## 2014-01-18 NOTE — Progress Notes (Signed)
   Subjective:    Patient ID: [name], female    DOB: 07-Sep-1955, 59 y.o.   MRN: [id number]  [facility] f/u- pt was admitted on 12/22 w/ difficulty swallowing, dark tarry stools.  Pt's Hgb at time of arrival was 5.8.  Transfused 2 units and Hgb improved to 9.  Pt reports no dark/tarry stools or BRBPR since d/c on 12/24.  Was found to have incidental CVA on MRI.  Has f/u scheduled w/ neuro at [facility] had EGD while hospitalized and had stricture dilation which has improved swallowing.  Pt on Dysphagia 3 diet.  Pt is now on 81 mg ASA.  No CP, SOB, HAs, visual changes, edema.   Review of Systems For ROS see HPI     Objective:   Physical Exam  Constitutional: She is oriented to person, place, and time. She appears well-developed and well-nourished. No distress.  Sitting in wheel chair  HENT:  Head: Normocephalic and atraumatic.  Cardiovascular: Normal rate, regular rhythm, normal heart sounds and intact distal pulses.   Pulmonary/Chest: Effort normal and breath sounds normal. No respiratory distress. She has no wheezes. She has no rales.  Neurological: She is alert and oriented to person, place, and time.  Skin: Skin is warm and dry.  Coloring is better  Psychiatric:  Flat affect  Vitals reviewed.         Assessment & Plan:

## 2014-01-18 NOTE — Assessment & Plan Note (Signed)
Thought to be incidental during recent hospitalization.  Pt has known brain tumor and f/u w/ neuro at [facility].  Now on ASA 81mg .  Will follow along and assist as able.

## 2014-01-18 NOTE — Patient Instructions (Signed)
We'll notify you of your lab results and make any changes if needed Continue the 81mg  aspirin and follow up w/ neuro as scheduled Call with any questions or concerns Hang in there! Happy New Year!!!

## 2014-01-18 NOTE — Progress Notes (Signed)
Pre visit review using our clinic review tool, if applicable. No additional management support is needed unless otherwise documented below in the visit note. 

## 2014-01-18 NOTE — Assessment & Plan Note (Signed)
New to provider.  Pt had EGD done during hospitalization and had early stricture that was dilated.  Pt reports swallowing has improved but remains on dysphagia 3 diet.

## 2014-02-09 ENCOUNTER — Ambulatory Visit (INDEPENDENT_AMBULATORY_CARE_PROVIDER_SITE_OTHER): Payer: 59 | Admitting: Family Medicine

## 2014-02-09 ENCOUNTER — Encounter: Payer: Self-pay | Admitting: Family Medicine

## 2014-02-09 VITALS — BP 122/70 | HR 89 | Temp 96.7°F | Resp 16

## 2014-02-09 DIAGNOSIS — G219 Secondary parkinsonism, unspecified: Secondary | ICD-10-CM

## 2014-02-09 DIAGNOSIS — R29898 Other symptoms and signs involving the musculoskeletal system: Secondary | ICD-10-CM

## 2014-02-09 DIAGNOSIS — Z789 Other specified health status: Secondary | ICD-10-CM

## 2014-02-09 DIAGNOSIS — R4189 Other symptoms and signs involving cognitive functions and awareness: Secondary | ICD-10-CM

## 2014-02-09 DIAGNOSIS — R29818 Other symptoms and signs involving the nervous system: Secondary | ICD-10-CM | POA: Insufficient documentation

## 2014-02-09 NOTE — Progress Notes (Signed)
Pre visit review using our clinic review tool, if applicable. No additional management support is needed unless otherwise documented below in the visit note. 

## 2014-02-09 NOTE — Progress Notes (Signed)
   Subjective:    Patient ID: [name], female    DOB: Apr 22, 1955, 59 y.o.   MRN: [id number]  HPI Neuro impairment- pt is seeing neurologist at [facility].  Had large volume LP to assess for possible improvement in case of hydrocephalus.  Pt's husband reports he spoke w/ pharmacist regarding medication interactions and he was concerned about Toviaz, Linzess, Keppra.  Pt's husband is concerned about her cognitive and motor impairment.  Wants to stop all meds.   Review of Systems For ROS see HPI     Objective:   Physical Exam  Constitutional: She appears well-developed and well-nourished. No distress.  Sitting in wheel chair  HENT:  Head: Normocephalic and atraumatic.  Neurological:  Limited facial expressions/movement Slowed speech  Skin: Skin is warm and dry.  Psychiatric:  Flat affect  Vitals reviewed.         Assessment & Plan:

## 2014-02-09 NOTE — Patient Instructions (Signed)
We will call you with your Neuro appt We will notify you of your Keppra level STOP the Linzess and the [place] Call with any questions or concerns Hang in there!!!

## 2014-02-12 NOTE — Assessment & Plan Note (Signed)
Chronic problem.  Pt has declined significantly in the last year and is now confined to a wheelchair.  See discussion above for plan.

## 2014-02-12 NOTE — Assessment & Plan Note (Signed)
Chronic problem for pt.  She has complicated neuro hx but she has declined fairly rapidly in the last year.  Has been seen at [facility] w/o concrete dx or obvious cause.  Discussed that I would be willing to stop 1 regular med- Toviaz- and 1 prn med- Linzess.  Will get Keppra level to ensure this is not toxic.  Discussed w/ husband that stopping all meds is not appropriate b/c if there is improvement, it would be impossible to determine which med was the culprit.  He seemed to understand this.  Recommended that she see our Neurologists for 2nd opinion.  They are in agreement w/ this.  Total time spent w/ pt- 30 minutes, >50% spent counseling.

## 2014-02-13 ENCOUNTER — Encounter: Payer: Self-pay | Admitting: Neurology

## 2014-02-13 LAB — LEVETIRACETAM LEVEL: Keppra (Levetiracetam): 108.2 ug/mL

## 2014-02-20 ENCOUNTER — Other Ambulatory Visit: Payer: Self-pay | Admitting: Family Medicine

## 2014-02-20 ENCOUNTER — Ambulatory Visit: Payer: 59 | Admitting: Neurology

## 2014-02-20 NOTE — Telephone Encounter (Signed)
Med filled.  

## 2014-03-06 ENCOUNTER — Other Ambulatory Visit: Payer: Self-pay | Admitting: Family Medicine

## 2014-03-06 NOTE — Telephone Encounter (Signed)
Med filled.  

## 2014-03-09 ENCOUNTER — Encounter: Payer: Self-pay | Admitting: Neurology

## 2014-03-09 ENCOUNTER — Ambulatory Visit (INDEPENDENT_AMBULATORY_CARE_PROVIDER_SITE_OTHER): Payer: 59 | Admitting: Neurology

## 2014-03-09 VITALS — BP 108/70 | HR 76

## 2014-03-09 DIAGNOSIS — G20 Parkinson's disease: Secondary | ICD-10-CM

## 2014-03-09 DIAGNOSIS — R413 Other amnesia: Secondary | ICD-10-CM

## 2014-03-09 DIAGNOSIS — G40009 Localization-related (focal) (partial) idiopathic epilepsy and epileptic syndromes with seizures of localized onset, not intractable, without status epilepticus: Secondary | ICD-10-CM

## 2014-03-09 MED ORDER — CARBIDOPA-LEVODOPA 25-100 MG PO TABS: 1.0000 | ORAL_TABLET | Freq: Three times a day (TID) | ORAL | Status: DC

## 2014-03-09 NOTE — Patient Instructions (Signed)
1. Start Carbidopa Levodopa as follows: 1/2 tab three times a day before meals x 1 wk, then 1/2 in am & noon & 1 in evening for a week, then 1/2 in am &1 at noon &one in evening for a week, then 1 tablet three times a day before meals.    

## 2014-03-09 NOTE — Progress Notes (Signed)
[name] was seen today in the movement disorders clinic for neurologic consultation at the request of Annye Asa, MD.  The consultation is for the evaluation of parkinsonism.  Prior records made available to me were reviewed.  The patient is accompanied by her husband who supplements the history.  The patient has been seen at [facility] for the same.  The patient reports that she had a left frontal glioma resected in approximately 1993 and subsequently had whole brain radiation (this seems to be in question).  She did well after this until she began to have seizures in the year 2000, and subsequently had a left temporal lobectomy in 2004.  Her husband states that more tumor was found at that time as well and more was removed then.  Since then, seizures have been fairly well controlled on Keppra.  She was able to get off of Lamictal.  She only has a seizure one time every year or two.  She had more when they tried generic medications, so they are on DAW keppra.    Her husband states that starting in 2005, they began to notice new changes which included cognitive changes, slowness of movement and falls.  These changes have been progressing very slowly with time.   She had a cholecystectomy in July, 2015 and seemed to really go downhill after this. She could not walk after that surgery, but her husband states that she didn't walk well at all before the surgery and would fall backwards.  She now walks in order to transfer but otherwise is very limited.   She saw Dr. Linus Mako on 01/19/14 and a DaT scan was recommended and it was normal.   Dr. Linus Mako also sent her to neurosurgery for an opinion re: hydrocephalous.  She apparently had a high volume LP at [facility] and there was no change following the lumbar puncture.  Her husband wonders if medications aren't causing some of the symptoms.  He specifically asks about Keppra causing the above sx's.    She does have a hx of  fibromyalgia and is on lyrica for that.  Her husband states that this has affected her more than her brain surgeries.  She takes 150 mg bid of lyrica for that.    She had an MRI brain done on 12/27/2013 that revealed extensive T2 hyperintensities, likely from radiation. available for my review today.   ALLERGIES:   Allergies  Allergen Reactions  . Versed [Midazolam] Other (See Comments) and Anaphylaxis    Severe respiratory depression, had to be masked in preop holding.  . Ciprofloxacin     REACTION: NAUSEA AND VOMITING  . Cyclobenzaprine Itching    Other reaction(s): Itching / Pruritis (ALLERGY/intolerance)  . Methocarbamol     REACTION: itching Patient currently tolerating it as its on her home med list.  . Metronidazole Other (See Comments)    Unknown   . Penicillins Rash    REACTION: RASH    CURRENT MEDICATIONS:  Outpatient Encounter Prescriptions as of 03/09/2014  Medication Sig  . aspirin EC 81 MG EC tablet Take 1 tablet (81 mg total) by mouth daily.  Marland Kitchen aspirin-acetaminophen-caffeine (EXCEDRIN MIGRAINE) 250-250-65 MG per tablet Take by mouth every 6 (six) hours as needed for headache.  . Calcium Citrate-Vitamin D (CITRACAL + D PO) Take 1 tablet by mouth daily. CA 1200mg -Vitamin D 1000iu  . DULoxetine (CYMBALTA) 60 MG capsule Take 60 mg by mouth daily.  . fenofibrate 160 MG tablet TAKE  1 TABLET BY MOUTH DAILY  . fesoterodine (TOVIAZ) 8 MG TB24 tablet Take 8 mg by mouth daily.  Marland Kitchen levETIRAcetam (KEPPRA) 500 MG tablet Take 1,500 mg by mouth 2 (two) times daily.   . Linaclotide (LINZESS) 145 MCG CAPS capsule Take 145 mcg by mouth as needed.  Marland Kitchen losartan-hydrochlorothiazide (HYZAAR) 50-12.5 MG per tablet Take 1 tablet by mouth daily.  Marland Kitchen LYRICA 150 MG capsule TAKE ONE CAPSULE BY MOUTH TWICE A DAY (Patient taking differently: TAKE two CAPSULE BY MOUTH TWICE A DAY)  . meloxicam (MOBIC) 15 MG tablet TAKE 1 TABLET BY MOUTH DAILY  . methocarbamol (ROBAXIN) 500 MG tablet Take 500 mg by  mouth daily as needed for muscle spasms.  . mirabegron ER (MYRBETRIQ) 50 MG TB24 tablet Take 50 mg by mouth.  . nitrofurantoin (MACRODANTIN) 50 MG capsule Take 100 mg by mouth daily.  Marland Kitchen omeprazole (PRILOSEC) 20 MG capsule TAKE 1 CAPSULE BY MOUTH DAILY  . promethazine (PHENERGAN) 25 MG tablet Take 25 mg by mouth every 6 (six) hours as needed for nausea or vomiting.  . rosuvastatin (CRESTOR) 10 MG tablet Take 10 mg by mouth every Monday, Wednesday, and Friday.   . traZODone (DESYREL) 50 MG tablet Take 1 tablet (50 mg total) by mouth at bedtime.  . carbidopa-levodopa (SINEMET IR) 25-100 MG per tablet Take 1 tablet by mouth 3 (three) times daily.  . [DISCONTINUED] HYDROcodone-acetaminophen (NORCO/VICODIN) 5-325 MG per tablet Take 1-2 tablets by mouth every 6 (six) hours as needed for moderate pain. (Patient not taking: Reported on 02/09/2014)    PAST MEDICAL HISTORY:   Past Medical History  Diagnosis Date  . Brain cancer     Frontal lobe, 1993 and 2005  . Migraines   . Morton neuroma   . Seizures   . Fibromyalgia   . Right fibular fracture 07/12/2011  . 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 Anemia 03/06/2013  . Secondary Parkinson disease   . Bladder spasm   . Hyperchloremia   . Hypertension   . GERD (gastroesophageal reflux disease)   . Stroke     PAST SURGICAL HISTORY:   Past Surgical History  Procedure Laterality Date  . Appendectomy  1976  . Excision morton's neuroma      right foot  . Brain cancer      1993 and 2005  . Orif ankle fracture  07/12/2011    Procedure: OPEN REDUCTION INTERNAL FIXATION (ORIF) ANKLE FRACTURE;  Surgeon: Johnny Bridge, MD;  Location: [place];  Service: Orthopedics;  Laterality: Right;  . Cholecystectomy  08/02/2013    Procedure: LAPAROSCOPIC CHOLECYSTECTOMY;  Surgeon: Gwenyth Ober, MD;  Location: [facility] OR;  Service: General;;  . Esophagogastroduodenoscopy N/A 12/28/2013    Procedure:  ESOPHAGOGASTRODUODENOSCOPY (EGD);  Surgeon: Inda Castle, MD;  Location: [place];  Service: Endoscopy;  Laterality: N/A;  . Savory dilation N/A 12/28/2013    Procedure: SAVORY DILATION;  Surgeon: Inda Castle, MD;  Location: [place];  Service: Endoscopy;  Laterality: N/A;    SOCIAL HISTORY:   History   Social History  . Marital Status: Married    Spouse Name: N/A  . Number of Children: N/A  . Years of Education: N/A   Occupational History  . Not on file.   Social History Main Topics  . Smoking status: Never Smoker   . Smokeless tobacco: Never Used  . Alcohol Use: No  .  Drug Use: No  . Sexual Activity: Not Currently   Other Topics Concern  . Not on file   Social History Narrative    FAMILY HISTORY:   Family Status  Relation Status Death Age  . Mother Alive     DM. HTN  . Father Alive     HTN, back problems  . Brother Alive     diverticulitis, HTN, DM  . Son Alive     healthy  . Son Alive     healthy    ROS:  A complete 10 system review of systems was obtained and was unremarkable apart from what is mentioned above.  PHYSICAL EXAMINATION:    VITALS:   Filed Vitals:   03/09/14 1351  BP: 108/70  Pulse: 76    GEN:  The patient appears stated age and is in NAD.  She has a flat affect. HEENT:  Normocephalic, atraumatic.  The mucous membranes are dry. The superficial temporal arteries are without ropiness or tenderness. CV:  RRR Lungs:  CTAB Neck/HEME:  There are no carotid bruits bilaterally.  Neurological examination:  Orientation: The patient is alert.  She is oriented to month and year and is very close when she states that it is the fourth, and it is actually the third.  She scores a 1/4 on her clock drawing. Cranial nerves: There is good facial symmetry.  There is mild facial hypomimia.  Pupils are equal round and reactive to light bilaterally. Fundoscopic exam reveals clear margins bilaterally. Extraocular muscles are intact. There are  square wave jerks.  There is end gaze nystagmus, especially at the far end of gaze horizontally when looking left.  The visual fields are full to confrontational testing. The speech is fluent and clear. Soft palate rises symmetrically and there is no tongue deviation. Hearing is intact to conversational tone. Sensation: Sensation is intact to light and pinprick throughout (facial, trunk, extremities). Vibration is intact at the bilateral big toe. There is no extinction with double simultaneous stimulation. There is no sensory dermatomal level identified. Motor: Strength is 5/5 in the bilateral upper and lower extremities.   Shoulder shrug is equal and symmetric.  There is a right pronator drift Deep tendon reflexes: Deep tendon reflexes are 2+-3-/4 at the bilateral biceps, triceps, brachioradialis, patella and achilles. Plantar responses are neutral bilaterally.  Movement examination: Tone: There is normal tone in the bilateral upper extremities.  The tone in the lower extremities is normal.  Abnormal movements: None Coordination:  There is no significant decremation with RAM's, although she is slow with rapid alternating movements. Gait and Station: The patient has significant difficulty arising out of a deep-seated chair without the use of the hands.  She pulls up on her husband to get out of the chair.  She then holds onto his hands and takes very short purposeful steps.        ASSESSMENT/PLAN:  1.  Post-radiation parkinsonism  -While rare, this can occur years after radiation to the brain.  DaT scan was normal.  Told her that levodopa likely would not be of benefit BUT that levodopa can help in 30% of patients with vascular parkinsonism, and those pts also have normal dopamine on DaT scan.  She would like to try.  I cautioned her that it likely won't help but we will give it a one month trial.  Risks, benefits, side effects and alternative therapies were discussed.  The opportunity to ask questions  was given and they were answered to the  best of my ability.  The patient expressed understanding and willingness to follow the outlined treatment protocols.  2.  Cognitive changes  -Due to post radiation changes and multiple brain surgeries.  Talked about potential medication influence although this is probably pretty low.  Has d/c myrbetriq in the past and no help with memory but bladder worsened.    3.  Epilepsy  -s/p temporal lobectomy  -husband thought keppra may be causing above sx's but I doubt.  He was concerned that her Keppra level was too high, but I explained that in general, we only used Keppra levels to determine compliance with medication and not necessarily to determine toxicity.  In addition, toxicities associated with levetiracetam use include decreased hematocrit and red blood cell count, decreased neutrophil count, somnolence, asthenia, and dizziness and these are not really the symptoms that she has (although she did recently have a decreased hematocrit, but that was from a GI bleed).  I would not recommend changing her medication as her epilepsy is well controlled.    4.  Greater than 50% of the 80 min visit was in counseling and coordinating care.  High complexity.

## 2014-03-10 ENCOUNTER — Telehealth: Payer: Self-pay | Admitting: Neurology

## 2014-03-10 NOTE — Telephone Encounter (Signed)
Tried to call patient/husband with no answer and no way to leave voicemail. To let them know Dr Tat discussed patient with Dr Delice Lesch and she recommends not decreasing Keppra and doesn't think symptoms are coming from medication. Will try to call patient again later.

## 2014-03-10 NOTE — Telephone Encounter (Signed)
Patient made aware of information below. She wanted me to speak with her directly. She will call with any questions.

## 2014-03-24 ENCOUNTER — Other Ambulatory Visit: Payer: Self-pay | Admitting: General Practice

## 2014-03-24 MED ORDER — PREGABALIN 150 MG PO CAPS: 150.0000 mg | ORAL_CAPSULE | Freq: Two times a day (BID) | ORAL | Status: DC

## 2014-04-11 ENCOUNTER — Telehealth: Payer: Self-pay | Admitting: Neurology

## 2014-04-11 NOTE — Telephone Encounter (Signed)
Spoke with patient to see how she is doing on Levodopa. She states that she sees no benefit to the medication at all. Advised she can stop Levodopa (in last office note it states this probably would not help) and made her aware I would call back if Dr Tat had any additional advise.

## 2014-04-12 ENCOUNTER — Other Ambulatory Visit: Payer: Self-pay | Admitting: Family Medicine

## 2014-04-12 NOTE — Telephone Encounter (Signed)
Med filled.  

## 2014-04-13 ENCOUNTER — Telehealth: Payer: Self-pay | Admitting: Neurology

## 2014-04-13 NOTE — Telephone Encounter (Signed)
Shanon Brow, pt's spouse called wanting to speak to a nurse regarding her meds. C/b [phone]

## 2014-04-13 NOTE — Telephone Encounter (Signed)
Spoke with patient's husband. He states patient did not remember talking to me the other day about how she was doing on Levodopa and stopping medication. He states he had slowed down titration and she is not even on the full dose yet. He thinks he sees benefit in her taking medication so they have decided to continue it. They will call with any questions.

## 2014-04-14 ENCOUNTER — Other Ambulatory Visit: Payer: Self-pay | Admitting: Family Medicine

## 2014-04-14 NOTE — Telephone Encounter (Signed)
Med filled.  

## 2014-04-29 ENCOUNTER — Other Ambulatory Visit: Payer: Self-pay | Admitting: Family Medicine

## 2014-05-01 NOTE — Telephone Encounter (Signed)
Med filled.  

## 2014-06-12 ENCOUNTER — Encounter: Payer: Self-pay | Admitting: Family Medicine

## 2014-06-12 ENCOUNTER — Ambulatory Visit (INDEPENDENT_AMBULATORY_CARE_PROVIDER_SITE_OTHER): Payer: 59 | Admitting: Family Medicine

## 2014-06-12 VITALS — BP 110/78 | HR 87 | Temp 97.9°F | Resp 16

## 2014-06-12 DIAGNOSIS — E785 Hyperlipidemia, unspecified: Secondary | ICD-10-CM

## 2014-06-12 DIAGNOSIS — I10 Essential (primary) hypertension: Secondary | ICD-10-CM

## 2014-06-12 MED ORDER — PROMETHAZINE HCL 25 MG PO TABS: 25.0000 mg | ORAL_TABLET | Freq: Four times a day (QID) | ORAL | Status: DC | PRN

## 2014-06-12 NOTE — Progress Notes (Signed)
   Subjective:    Patient ID: [name], female    DOB: 05-11-1955, 59 y.o.   MRN: [id number]  HPI HTN- chronic problem.  On Losartan HCTZ.  Excellent control.  Denies CP, SOB above baseline, visual changes, edema.  + chronic HAs.  Cholesterol- chronic problem.  On Crestor and Fenofibrate.  Denies abd pain, N/V.  + chronic myalgias.   Review of Systems For ROS see HPI     Objective:   Physical Exam  Constitutional: She is oriented to person, place, and time. She appears well-developed and well-nourished. No distress.  HENT:  Head: Normocephalic and atraumatic.  Eyes: Conjunctivae and EOM are normal. Pupils are equal, round, and reactive to light.  Neck: Normal range of motion. Neck supple. No thyromegaly present.  Cardiovascular: Normal rate, regular rhythm, normal heart sounds and intact distal pulses.   No murmur heard. Pulmonary/Chest: Effort normal and breath sounds normal. No respiratory distress.  Abdominal: Soft. She exhibits no distension. There is no tenderness.  Musculoskeletal: She exhibits no edema.  Lymphadenopathy:    She has no cervical adenopathy.  Neurological: She is alert and oriented to person, place, and time.  Skin: Skin is warm and dry.  Psychiatric: Her behavior is normal.  Flat affect  Vitals reviewed.         Assessment & Plan:

## 2014-06-12 NOTE — Assessment & Plan Note (Signed)
Chronic problem.  Excellent BP control.  Asymptomatic.  Check labs.  No anticipated med changes.

## 2014-06-12 NOTE — Progress Notes (Signed)
Pre visit review using our clinic review tool, if applicable. No additional management support is needed unless otherwise documented below in the visit note. 

## 2014-06-12 NOTE — Assessment & Plan Note (Signed)
Chronic problem.  Tolerating statin w/o difficulty.  Check labs.  Adjust meds prn  

## 2014-06-12 NOTE — Patient Instructions (Signed)
Schedule your complete physical in 6 months We'll notify you of your lab results and make any changes if needed Call with any questions or concerns Hang in there! Enjoy your summer!!

## 2014-06-13 ENCOUNTER — Other Ambulatory Visit: Payer: Self-pay | Admitting: General Practice

## 2014-06-13 LAB — CBC WITH DIFFERENTIAL/PLATELET
BASOS PCT: 0.7 % (ref 0.0–3.0)
Basophils Absolute: 0 10*3/uL (ref 0.0–0.1)
EOS PCT: 2.4 % (ref 0.0–5.0)
Eosinophils Absolute: 0.1 10*3/uL (ref 0.0–0.7)
HCT: 37.5 % (ref 36.0–46.0)
Hemoglobin: 12 g/dL (ref 12.0–15.0)
LYMPHS PCT: 32.1 % (ref 12.0–46.0)
Lymphs Abs: 1.4 10*3/uL (ref 0.7–4.0)
MCHC: 32 g/dL (ref 30.0–36.0)
MCV: 87.9 fl (ref 78.0–100.0)
MONO ABS: 0.3 10*3/uL (ref 0.1–1.0)
Monocytes Relative: 7.1 % (ref 3.0–12.0)
NEUTROS PCT: 57.7 % (ref 43.0–77.0)
Neutro Abs: 2.5 10*3/uL (ref 1.4–7.7)
Platelets: 278 10*3/uL (ref 150.0–400.0)
RBC: 4.27 Mil/uL (ref 3.87–5.11)
RDW: 15.9 % — ABNORMAL HIGH (ref 11.5–15.5)
WBC: 4.3 10*3/uL (ref 4.0–10.5)

## 2014-06-13 LAB — BASIC METABOLIC PANEL
BUN: 19 mg/dL (ref 6–23)
CALCIUM: 9.8 mg/dL (ref 8.4–10.5)
CO2: 26 meq/L (ref 19–32)
Chloride: 102 mEq/L (ref 96–112)
Creatinine, Ser: 0.95 mg/dL (ref 0.40–1.20)
GFR: 64.5 mL/min (ref >60.00)
GLUCOSE: 77 mg/dL (ref 70–99)
Potassium: 3.9 mEq/L (ref 3.5–5.1)
Sodium: 137 mEq/L (ref 135–145)

## 2014-06-13 LAB — LIPID PANEL
Cholesterol: 141 mg/dL (ref 0–200)
HDL: 38.2 mg/dL — ABNORMAL LOW (ref >39.00)
LDL CALC: 82 mg/dL (ref 0–99)
NONHDL: 102.8
TRIGLYCERIDES: 103 mg/dL (ref 0.0–149.0)
Total CHOL/HDL Ratio: 4
VLDL: 20.6 mg/dL (ref 0.0–40.0)

## 2014-06-13 LAB — HEPATIC FUNCTION PANEL
ALK PHOS: 38 U/L — AB (ref 39–117)
ALT: 10 U/L (ref 0–35)
AST: 22 U/L (ref 0–37)
Albumin: 4.5 g/dL (ref 3.5–5.2)
Bilirubin, Direct: 0.1 mg/dL (ref 0.0–0.3)
Total Bilirubin: 0.5 mg/dL (ref 0.2–1.2)
Total Protein: 7.8 g/dL (ref 6.0–8.3)

## 2014-06-13 MED ORDER — FENOFIBRATE 160 MG PO TABS: 160.0000 mg | ORAL_TABLET | Freq: Every day | ORAL | Status: DC

## 2014-07-01 ENCOUNTER — Encounter: Payer: Self-pay | Admitting: Family Medicine

## 2014-07-03 MED ORDER — FENOFIBRATE 160 MG PO TABS: 160.0000 mg | ORAL_TABLET | Freq: Every day | ORAL | Status: DC

## 2014-07-03 NOTE — Telephone Encounter (Signed)
Med filled.  

## 2014-07-17 ENCOUNTER — Ambulatory Visit (HOSPITAL_BASED_OUTPATIENT_CLINIC_OR_DEPARTMENT_OTHER)
Admission: RE | Admit: 2014-07-17 | Discharge: 2014-07-17 | Disposition: A | Discharge status: Home / Self Care | Payer: 59 | Source: Ambulatory Visit | Attending: Medical | Admitting: Medical

## 2014-07-17 ENCOUNTER — Ambulatory Visit (INDEPENDENT_AMBULATORY_CARE_PROVIDER_SITE_OTHER): Payer: 59 | Admitting: Medical

## 2014-07-17 ENCOUNTER — Encounter: Payer: Self-pay | Admitting: Medical

## 2014-07-17 VITALS — BP 90/70 | HR 90 | Temp 98.7°F | Ht 62.0 in

## 2014-07-17 DIAGNOSIS — S8011XA Contusion of right lower leg, initial encounter: Secondary | ICD-10-CM | POA: Diagnosis present

## 2014-07-17 DIAGNOSIS — I959 Hypotension, unspecified: Secondary | ICD-10-CM

## 2014-07-17 DIAGNOSIS — M79661 Pain in right lower leg: Secondary | ICD-10-CM | POA: Diagnosis not present

## 2014-07-17 DIAGNOSIS — X58XXXA Exposure to other specified factors, initial encounter: Secondary | ICD-10-CM | POA: Diagnosis not present

## 2014-07-17 DIAGNOSIS — T148XXA Other injury of unspecified body region, initial encounter: Secondary | ICD-10-CM

## 2014-07-17 DIAGNOSIS — T148 Other injury of unspecified body region: Secondary | ICD-10-CM | POA: Diagnosis not present

## 2014-07-17 DIAGNOSIS — G20 Parkinson's disease: Secondary | ICD-10-CM | POA: Diagnosis not present

## 2014-07-17 DIAGNOSIS — L089 Local infection of the skin and subcutaneous tissue, unspecified: Secondary | ICD-10-CM | POA: Diagnosis not present

## 2014-07-17 MED ORDER — SULFAMETHOXAZOLE-TRIMETHOPRIM 400-80 MG PO TABS: 1.0000 | ORAL_TABLET | Freq: Two times a day (BID) | ORAL | Status: DC

## 2014-07-17 NOTE — Progress Notes (Signed)
Subjective:    Patient ID: [name], female    DOB: [birth year], 59 y.o.   MRN: [id number]  HPI  Pt in with husband. Pt has small abrasion for about 2wks on her rt pretibial tetanus. Pt has some discolored appearance per husband.  Pt has no fever or chills.  Pt up to date on tetanus but will be due in October.  No trauma that pt or husband can remember. Pt does sleep in hospital bed.  Pt takes trimethroprim for uti prevention.  Review of Systems  Constitutional: Negative for fever, chills and fatigue.  Respiratory: Negative for cough, chest tightness, shortness of breath and wheezing.   Cardiovascular: Negative for chest pain and palpitations.  Musculoskeletal:       Pretibial region tender.  Skin:       Rt pretib scab.  Neurological: Negative for dizziness, syncope, weakness, light-headedness, numbness and headaches.  Hematological: Negative for adenopathy. Does not bruise/bleed easily.   Past Medical History  Diagnosis Date  . Brain cancer     Frontal lobe, 1993 and 2005  . Migraines   . Morton neuroma   . Seizures   . Fibromyalgia   . Right fibular fracture 07/12/2011  . 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 Anemia 03/06/2013  . Secondary Parkinson disease   . Bladder spasm   . Hyperchloremia   . Hypertension   . GERD (gastroesophageal reflux disease)   . Stroke     History   Social History  . Marital Status: Married    Spouse Name: N/A  . Number of Children: N/A  . Years of Education: N/A   Occupational History  . Not on file.   Social History Main Topics  . Smoking status: Never Smoker   . Smokeless tobacco: Never Used  . Alcohol Use: No  . Drug Use: No  . Sexual Activity: Not Currently   Other Topics Concern  . Not on file   Social History Narrative    Past Surgical History  Procedure Laterality Date  . Appendectomy  1976  . Excision morton's neuroma      right  foot  . Brain cancer      1993 and 2005  . Orif ankle fracture  07/12/2011    Procedure: OPEN REDUCTION INTERNAL FIXATION (ORIF) ANKLE FRACTURE;  Surgeon: Johnny Bridge, MD;  Location: [place];  Service: Orthopedics;  Laterality: Right;  . Cholecystectomy  08/02/2013    Procedure: LAPAROSCOPIC CHOLECYSTECTOMY;  Surgeon: Gwenyth Ober, MD;  Location: [facility] OR;  Service: General;;  . Esophagogastroduodenoscopy N/A 12/28/2013    Procedure: ESOPHAGOGASTRODUODENOSCOPY (EGD);  Surgeon: Inda Castle, MD;  Location: [place];  Service: Endoscopy;  Laterality: N/A;  . Savory dilation N/A 12/28/2013    Procedure: SAVORY DILATION;  Surgeon: Inda Castle, MD;  Location: [place];  Service: Endoscopy;  Laterality: N/A;    Family History  Problem Relation Age of Onset  . Diabetes Mother   . Diabetes Brother   . Hypertension Mother   . Hypertension Father     Allergies  Allergen Reactions  . Versed [Midazolam] Other (See Comments) and Anaphylaxis    Severe respiratory depression, had to be masked in preop holding.  . Ciprofloxacin     REACTION: NAUSEA AND VOMITING  . Cyclobenzaprine Itching    Other reaction(s): Itching / Pruritis (ALLERGY/intolerance)  . Methocarbamol  REACTION: itching Patient currently tolerating it as its on her home med list.  . Metronidazole Other (See Comments)    Unknown   . Penicillins Rash    REACTION: RASH    Current Outpatient Prescriptions on File Prior to Visit  Medication Sig Dispense Refill  . aspirin EC 81 MG EC tablet Take 1 tablet (81 mg total) by mouth daily. 30 tablet 3  . aspirin-acetaminophen-caffeine (EXCEDRIN MIGRAINE) 161-096-04 MG per tablet Take by mouth every 6 (six) hours as needed for headache.    . Calcium Citrate-Vitamin D (CITRACAL + D PO) Take 1 tablet by mouth daily. CA 1200mg -Vitamin D 1000iu    . CRESTOR 10 MG tablet Take 1 tablet by mouth   every Monday, Wednesday,   Friday 39 tablet 6  . DULoxetine (CYMBALTA) 60 MG  capsule TAKE 1 CAPSULE BY MOUTH DAILY 30 capsule 6  . fenofibrate 160 MG tablet Take 1 tablet (160 mg total) by mouth daily. 30 tablet 6  . fesoterodine (TOVIAZ) 8 MG TB24 tablet Take 8 mg by mouth daily.    Marland Kitchen levETIRAcetam (KEPPRA) 500 MG tablet Take 1,500 mg by mouth 2 (two) times daily.     . Linaclotide (LINZESS) 145 MCG CAPS capsule Take 145 mcg by mouth as needed.    Marland Kitchen losartan-hydrochlorothiazide (HYZAAR) 50-12.5 MG per tablet TAKE 1 TABLET BY MOUTH ONCE DAILY 30 tablet 6  . methocarbamol (ROBAXIN) 500 MG tablet Take 500 mg by mouth daily as needed for muscle spasms.    . nitrofurantoin (MACRODANTIN) 50 MG capsule Take 100 mg by mouth daily.    Marland Kitchen omeprazole (PRILOSEC) 20 MG capsule TAKE 1 CAPSULE BY MOUTH DAILY 30 capsule 6  . promethazine (PHENERGAN) 25 MG tablet Take 1 tablet (25 mg total) by mouth every 6 (six) hours as needed for nausea or vomiting. 30 tablet 3  . TRIMETHOPRIM PO Take 100 mg by mouth daily. When active UTI    . pregabalin (LYRICA) 150 MG capsule Take 1 capsule (150 mg total) by mouth 2 (two) times daily. 60 capsule 3  . [DISCONTINUED] gabapentin (NEURONTIN) 100 MG capsule      No current facility-administered medications on file prior to visit.    BP 90/70 mmHg  Pulse 90  Temp(Src) 98.7 F (37.1 C) (Oral)  Ht 5\' 2"  (1.575 m)  Wt   SpO2 98%       Objective:   Physical Exam  General- No acute distress. Pleasant patient. Neck- Full range of motion, no jvd Lungs- Clear, even and unlabored. Heart- regular rate and rhythm. Neurologic- CNII- XII grossly intact.  Rt lower ext- mid pretibial area has small scab with 2 cm yellowish colored puffy contused area over tibia surrounding the scab area is tender to touch). negative homans signs.     Assessment & Plan:  By appearance of scab it appears some sort of trauma occurred. By physical exam it appears soft tissue swelling. Some possibility of infection presently. Though the faint yellowish color likely  represent stage of bruise absorption.  Will get xray of rt tibia/fibula today. Stop trimepthorpim antibiotic for urine prophylaxis since I am prescribing bactrim.  Pt bp is lower than usual. I have never never seen her before. She denies fatigue. She looks little pale. Will get cbc today.  Follow up in 7 days or as needed

## 2014-07-17 NOTE — Progress Notes (Signed)
Pre visit review using our clinic review tool, if applicable. No additional management support is needed unless otherwise documented below in the visit note. 

## 2014-07-17 NOTE — Patient Instructions (Addendum)
By appearance of scab it appears some sort of trauma occurred. By physical exam it appears soft tissue swelling. Some possibility of infection presently. Though the faint yellowish color likely represent stage of bruise absorption.  Will get xray of rt tibia/fibula today. Stop trimepthorpim antibiotic for urine prophylaxis since I am prescribing bactrim.  Pt bp is lower than usual. I have never never seen her before. She denies fatigue. She looks little pale. Will get cbc today.  Follow up in 7 days or as needed

## 2014-07-18 LAB — CBC WITH DIFFERENTIAL/PLATELET
BASOS ABS: 0 10*3/uL (ref 0.0–0.1)
Basophils Relative: 0.8 % (ref 0.0–3.0)
EOS PCT: 2.6 % (ref 0.0–5.0)
Eosinophils Absolute: 0.2 10*3/uL (ref 0.0–0.7)
HEMATOCRIT: 38.4 % (ref 36.0–46.0)
HEMOGLOBIN: 12.6 g/dL (ref 12.0–15.0)
LYMPHS ABS: 2 10*3/uL (ref 0.7–4.0)
LYMPHS PCT: 30.8 % (ref 12.0–46.0)
MCHC: 32.7 g/dL (ref 30.0–36.0)
MCV: 88.6 fl (ref 78.0–100.0)
Monocytes Absolute: 0.7 10*3/uL (ref 0.1–1.0)
Monocytes Relative: 10.5 % (ref 3.0–12.0)
NEUTROS ABS: 3.5 10*3/uL (ref 1.4–7.7)
Neutrophils Relative %: 55.3 % (ref 43.0–77.0)
Platelets: 262 10*3/uL (ref 150.0–400.0)
RBC: 4.33 Mil/uL (ref 3.87–5.11)
RDW: 16.5 % — ABNORMAL HIGH (ref 11.5–15.5)
WBC: 6.3 10*3/uL (ref 4.0–10.5)

## 2014-07-26 ENCOUNTER — Ambulatory Visit: Payer: 59 | Admitting: Medical

## 2014-07-31 ENCOUNTER — Other Ambulatory Visit: Payer: Self-pay | Admitting: *Deleted

## 2014-07-31 MED ORDER — PREGABALIN 150 MG PO CAPS: 150.0000 mg | ORAL_CAPSULE | Freq: Two times a day (BID) | ORAL | Status: DC

## 2014-07-31 NOTE — Telephone Encounter (Signed)
Lyrica refilled per protocol. Manually faxed to [facility] successfully. JG//CMA

## 2014-08-27 ENCOUNTER — Encounter: Payer: Self-pay | Admitting: Family Medicine

## 2014-08-28 MED ORDER — PROMETHAZINE HCL 25 MG PO TABS
25.0000 mg | ORAL_TABLET | Freq: Four times a day (QID) | ORAL | Status: DC | PRN
Start: 2014-08-28 — End: 2014-12-14

## 2014-08-28 NOTE — Telephone Encounter (Signed)
Medication filled to pharmacy as requested.   

## 2014-09-19 ENCOUNTER — Encounter: Payer: Self-pay | Admitting: Family Medicine

## 2014-09-20 ENCOUNTER — Encounter: Payer: Self-pay | Admitting: Physician Assistant

## 2014-09-20 ENCOUNTER — Ambulatory Visit (INDEPENDENT_AMBULATORY_CARE_PROVIDER_SITE_OTHER): Payer: 59 | Admitting: Physician Assistant

## 2014-09-20 ENCOUNTER — Encounter: Payer: Self-pay | Admitting: Family Medicine

## 2014-09-20 VITALS — BP 99/66 | HR 82 | Temp 98.1°F | Resp 16

## 2014-09-20 DIAGNOSIS — B372 Candidiasis of skin and nail: Secondary | ICD-10-CM

## 2014-09-20 MED ORDER — NYSTATIN 100000 UNIT/GM EX OINT: 1.0000 "application " | TOPICAL_OINTMENT | Freq: Two times a day (BID) | CUTANEOUS | Status: DC

## 2014-09-20 MED ORDER — NYSTATIN 100000 UNIT/GM EX POWD: CUTANEOUS | Status: DC

## 2014-09-20 MED ORDER — FLUCONAZOLE 150 MG PO TABS: 150.0000 mg | ORAL_TABLET | Freq: Once | ORAL | Status: DC

## 2014-09-20 NOTE — Progress Notes (Signed)
Patient presents to clinic today c/o rash of perineal region that is itching and burning x 1 week. Patient is incontinent of urine sometimes and endorses it is hard for her and her husband (caretaker) to always keep clean and dry. Denies fever, chills, vaginal discharge, pain or pruritus.  Past Medical History  Diagnosis Date  . Brain cancer     Frontal lobe, 1993 and 2005  . Migraines   . Morton neuroma   . Seizures   . Fibromyalgia   . Right fibular fracture 07/12/2011  . 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 Anemia 03/06/2013  . Secondary Parkinson disease   . Bladder spasm   . Hyperchloremia   . Hypertension   . GERD (gastroesophageal reflux disease)   . Stroke     Current Outpatient Prescriptions on File Prior to Visit  Medication Sig Dispense Refill  . aspirin EC 81 MG EC tablet Take 1 tablet (81 mg total) by mouth daily. 30 tablet 3  . aspirin-acetaminophen-caffeine (EXCEDRIN MIGRAINE) 672-094-70 MG per tablet Take by mouth every 6 (six) hours as needed for headache.    . Calcium Citrate-Vitamin D (CITRACAL + D PO) Take 1 tablet by mouth daily. CA 1200mg -Vitamin D 1000iu    . CRESTOR 10 MG tablet Take 1 tablet by mouth   every Monday, Wednesday,   Friday 39 tablet 6  . DULoxetine (CYMBALTA) 60 MG capsule TAKE 1 CAPSULE BY MOUTH DAILY 30 capsule 6  . fenofibrate 160 MG tablet Take 1 tablet (160 mg total) by mouth daily. 30 tablet 6  . fesoterodine (TOVIAZ) 8 MG TB24 tablet Take 8 mg by mouth daily.    Marland Kitchen levETIRAcetam (KEPPRA) 500 MG tablet Take 1,500 mg by mouth 2 (two) times daily.     . Linaclotide (LINZESS) 145 MCG CAPS capsule Take 145 mcg by mouth as needed.    Marland Kitchen losartan-hydrochlorothiazide (HYZAAR) 50-12.5 MG per tablet TAKE 1 TABLET BY MOUTH ONCE DAILY 30 tablet 6  . methocarbamol (ROBAXIN) 500 MG tablet Take 500 mg by mouth daily as needed for muscle spasms.    Marland Kitchen omeprazole (PRILOSEC) 20 MG  capsule TAKE 1 CAPSULE BY MOUTH DAILY 30 capsule 6  . pregabalin (LYRICA) 150 MG capsule Take 1 capsule (150 mg total) by mouth 2 (two) times daily. 60 capsule 3  . promethazine (PHENERGAN) 25 MG tablet Take 1 tablet (25 mg total) by mouth every 6 (six) hours as needed for nausea or vomiting. 30 tablet 3  . TRIMETHOPRIM PO Take 100 mg by mouth daily. When active UTI    . [DISCONTINUED] gabapentin (NEURONTIN) 100 MG capsule      No current facility-administered medications on file prior to visit.    Allergies  Allergen Reactions  . Versed [Midazolam] Other (See Comments) and Anaphylaxis    Severe respiratory depression, had to be masked in preop holding.  . Ciprofloxacin     REACTION: NAUSEA AND VOMITING  . Cyclobenzaprine Itching    Other reaction(s): Itching / Pruritis (ALLERGY/intolerance)  . Methocarbamol     REACTION: itching Patient currently tolerating it as its on her home med list.  . Metronidazole Other (See Comments)    Unknown   . Penicillins Rash    REACTION: RASH    Family History  Problem Relation Age of Onset  . Diabetes Mother   . Diabetes Brother   . Hypertension Mother   .  Hypertension Father     Social History   Social History  . Marital Status: Married    Spouse Name: N/A  . Number of Children: N/A  . Years of Education: N/A   Social History Main Topics  . Smoking status: Never Smoker   . Smokeless tobacco: Never Used  . Alcohol Use: No  . Drug Use: No  . Sexual Activity: Not Currently   Other Topics Concern  . None   Social History Narrative    Review of Systems - See HPI.  All other ROS are negative.  BP 99/66 mmHg  Pulse 82  Temp(Src) 98.1 F (36.7 C) (Oral)  Resp 16  SpO2 93%  Physical Exam  Constitutional: She is oriented to person, place, and time and well-developed, well-nourished, and in no distress.  HENT:  Head: Normocephalic and atraumatic.  Cardiovascular: Normal rate, regular rhythm, normal heart sounds and intact  distal pulses.   Pulmonary/Chest: Effort normal and breath sounds normal. No respiratory distress. She has no wheezes. She has no rales. She exhibits no tenderness.  Neurological: She is alert and oriented to person, place, and time.  Skin: Skin is warm and dry.     Vitals reviewed.   Recent Results (from the past 2160 hour(s))  CBC w/Diff     Status: Abnormal   Collection Time: 07/17/14  5:02 PM  Result Value Ref Range   WBC 6.3 4.0 - 10.5 K/uL   RBC 4.33 3.87 - 5.11 Mil/uL   Hemoglobin 12.6 12.0 - 15.0 g/dL   HCT 38.4 36.0 - 46.0 %   MCV 88.6 78.0 - 100.0 fl   MCHC 32.7 30.0 - 36.0 g/dL   RDW 16.5 (H) 11.5 - 15.5 %   Platelets 262.0 150.0 - 400.0 K/uL   Neutrophils Relative % 55.3 43.0 - 77.0 %   Lymphocytes Relative 30.8 12.0 - 46.0 %   Monocytes Relative 10.5 3.0 - 12.0 %   Eosinophils Relative 2.6 0.0 - 5.0 %   Basophils Relative 0.8 0.0 - 3.0 %   Neutro Abs 3.5 1.4 - 7.7 K/uL   Lymphs Abs 2.0 0.7 - 4.0 K/uL   Monocytes Absolute 0.7 0.1 - 1.0 K/uL   Eosinophils Absolute 0.2 0.0 - 0.7 K/uL   Basophils Absolute 0.0 0.0 - 0.1 K/uL    Assessment/Plan: Yeast infection of the skin Significant. Diflucan x 1. Rx Mycostatin ointment to use BID. Hygiene measures discussed. Rx Nystatin powder to apply daily after bathing and drying to help keep axilla, breast, groin area as dry as possible and prevent recurrence.

## 2014-09-20 NOTE — Patient Instructions (Signed)
Please keep area clean and dry. Apply the mycostatin ointment twice daily until rash is resolved. Take diflucan once as directed.  Once symptoms have resolved you can use the Nystatin powder to apply under the breasts, armpits, and genital region to help keep area dry and prevent recurrence.  Use gentle soaps like dove -- not much soap is needed for the genital region.

## 2014-09-22 DIAGNOSIS — B372 Candidiasis of skin and nail: Secondary | ICD-10-CM | POA: Insufficient documentation

## 2014-09-22 NOTE — Assessment & Plan Note (Signed)
Significant. Diflucan x 1. Rx Mycostatin ointment to use BID. Hygiene measures discussed. Rx Nystatin powder to apply daily after bathing and drying to help keep axilla, breast, groin area as dry as possible and prevent recurrence.

## 2014-11-02 ENCOUNTER — Ambulatory Visit: Payer: 59 | Admitting: Internal Medicine

## 2014-11-05 ENCOUNTER — Emergency Department (HOSPITAL_COMMUNITY): Payer: 59

## 2014-11-05 ENCOUNTER — Encounter (HOSPITAL_COMMUNITY): Payer: Self-pay | Admitting: Emergency Medicine

## 2014-11-05 ENCOUNTER — Inpatient Hospital Stay (HOSPITAL_COMMUNITY)
Admission: EM | Admit: 2014-11-05 | Discharge: 2014-11-11 | DRG: 315 | Disposition: A | Discharge status: Home / Self Care | Payer: 59 | Attending: Internal Medicine | Admitting: Internal Medicine

## 2014-11-05 DIAGNOSIS — Z79899 Other long term (current) drug therapy: Secondary | ICD-10-CM

## 2014-11-05 DIAGNOSIS — R2990 Unspecified symptoms and signs involving the nervous system: Secondary | ICD-10-CM | POA: Diagnosis present

## 2014-11-05 DIAGNOSIS — I952 Hypotension due to drugs: Secondary | ICD-10-CM | POA: Diagnosis present

## 2014-11-05 DIAGNOSIS — G40909 Epilepsy, unspecified, not intractable, without status epilepticus: Secondary | ICD-10-CM | POA: Diagnosis present

## 2014-11-05 DIAGNOSIS — K219 Gastro-esophageal reflux disease without esophagitis: Secondary | ICD-10-CM | POA: Diagnosis present

## 2014-11-05 DIAGNOSIS — C711 Malignant neoplasm of frontal lobe: Secondary | ICD-10-CM | POA: Diagnosis present

## 2014-11-05 DIAGNOSIS — D649 Anemia, unspecified: Secondary | ICD-10-CM | POA: Diagnosis present

## 2014-11-05 DIAGNOSIS — R410 Disorientation, unspecified: Secondary | ICD-10-CM | POA: Diagnosis present

## 2014-11-05 DIAGNOSIS — N179 Acute kidney failure, unspecified: Secondary | ICD-10-CM | POA: Diagnosis present

## 2014-11-05 DIAGNOSIS — I69354 Hemiplegia and hemiparesis following cerebral infarction affecting left non-dominant side: Secondary | ICD-10-CM

## 2014-11-05 DIAGNOSIS — I9589 Other hypotension: Principal | ICD-10-CM | POA: Diagnosis present

## 2014-11-05 DIAGNOSIS — R531 Weakness: Secondary | ICD-10-CM | POA: Diagnosis not present

## 2014-11-05 DIAGNOSIS — Z6837 Body mass index (BMI) 37.0-37.9, adult: Secondary | ICD-10-CM

## 2014-11-05 DIAGNOSIS — I639 Cerebral infarction, unspecified: Secondary | ICD-10-CM

## 2014-11-05 DIAGNOSIS — Z7982 Long term (current) use of aspirin: Secondary | ICD-10-CM

## 2014-11-05 DIAGNOSIS — N390 Urinary tract infection, site not specified: Secondary | ICD-10-CM | POA: Diagnosis present

## 2014-11-05 DIAGNOSIS — E2749 Other adrenocortical insufficiency: Secondary | ICD-10-CM | POA: Diagnosis present

## 2014-11-05 DIAGNOSIS — G40409 Other generalized epilepsy and epileptic syndromes, not intractable, without status epilepticus: Secondary | ICD-10-CM | POA: Diagnosis present

## 2014-11-05 DIAGNOSIS — Z85841 Personal history of malignant neoplasm of brain: Secondary | ICD-10-CM

## 2014-11-05 DIAGNOSIS — M797 Fibromyalgia: Secondary | ICD-10-CM | POA: Diagnosis present

## 2014-11-05 DIAGNOSIS — E669 Obesity, unspecified: Secondary | ICD-10-CM | POA: Diagnosis present

## 2014-11-05 DIAGNOSIS — E785 Hyperlipidemia, unspecified: Secondary | ICD-10-CM | POA: Diagnosis present

## 2014-11-05 DIAGNOSIS — R55 Syncope and collapse: Secondary | ICD-10-CM | POA: Diagnosis present

## 2014-11-05 DIAGNOSIS — G40309 Generalized idiopathic epilepsy and epileptic syndromes, not intractable, without status epilepticus: Secondary | ICD-10-CM | POA: Diagnosis present

## 2014-11-05 DIAGNOSIS — L8992 Pressure ulcer of unspecified site, stage 2: Secondary | ICD-10-CM | POA: Diagnosis present

## 2014-11-05 DIAGNOSIS — G219 Secondary parkinsonism, unspecified: Secondary | ICD-10-CM | POA: Diagnosis present

## 2014-11-05 DIAGNOSIS — I959 Hypotension, unspecified: Secondary | ICD-10-CM | POA: Diagnosis present

## 2014-11-05 DIAGNOSIS — I10 Essential (primary) hypertension: Secondary | ICD-10-CM | POA: Diagnosis present

## 2014-11-05 DIAGNOSIS — R29705 NIHSS score 5: Secondary | ICD-10-CM | POA: Diagnosis present

## 2014-11-05 DIAGNOSIS — M6289 Other specified disorders of muscle: Secondary | ICD-10-CM

## 2014-11-05 LAB — COMPREHENSIVE METABOLIC PANEL
ALT: 13 U/L — AB (ref 14–54)
AST: 30 U/L (ref 15–41)
Albumin: 3.8 g/dL (ref 3.5–5.0)
Alkaline Phosphatase: 33 U/L — ABNORMAL LOW (ref 38–126)
Anion gap: 14 (ref 5–15)
BUN: 17 mg/dL (ref 6–20)
CHLORIDE: 103 mmol/L (ref 101–111)
CO2: 18 mmol/L — AB (ref 22–32)
CREATININE: 1.3 mg/dL — AB (ref 0.44–1.00)
Calcium: 8.9 mg/dL (ref 8.9–10.3)
GFR calc non Af Amer: 45 mL/min — ABNORMAL LOW (ref >60)
GFR, EST AFRICAN AMERICAN: 52 mL/min — AB (ref >60)
Glucose, Bld: 108 mg/dL — ABNORMAL HIGH (ref 65–99)
POTASSIUM: 3.6 mmol/L (ref 3.5–5.1)
SODIUM: 135 mmol/L (ref 135–145)
Total Bilirubin: 0.8 mg/dL (ref 0.3–1.2)
Total Protein: 6.8 g/dL (ref 6.5–8.1)

## 2014-11-05 LAB — I-STAT TROPONIN, ED: TROPONIN I, POC: 0 ng/mL (ref 0.00–0.08)

## 2014-11-05 LAB — URINALYSIS, ROUTINE W REFLEX MICROSCOPIC
BILIRUBIN URINE: NEGATIVE
GLUCOSE, UA: NEGATIVE mg/dL
KETONES UR: NEGATIVE mg/dL
Nitrite: NEGATIVE
PH: 6.5 (ref 5.0–8.0)
PROTEIN: 30 mg/dL — AB
Specific Gravity, Urine: 1.012 (ref 1.005–1.030)
Urobilinogen, UA: 1 mg/dL (ref 0.0–1.0)

## 2014-11-05 LAB — CBC WITH DIFFERENTIAL/PLATELET
Basophils Absolute: 0 10*3/uL (ref 0.0–0.1)
Basophils Relative: 1 %
EOS ABS: 0.1 10*3/uL (ref 0.0–0.7)
Eosinophils Relative: 2 %
HEMATOCRIT: 36.5 % (ref 36.0–46.0)
HEMOGLOBIN: 11.8 g/dL — AB (ref 12.0–15.0)
LYMPHS ABS: 2.4 10*3/uL (ref 0.7–4.0)
LYMPHS PCT: 32 %
MCH: 29.5 pg (ref 26.0–34.0)
MCHC: 32.3 g/dL (ref 30.0–36.0)
MCV: 91.3 fL (ref 78.0–100.0)
MONOS PCT: 10 %
Monocytes Absolute: 0.8 10*3/uL (ref 0.1–1.0)
NEUTROS PCT: 55 %
Neutro Abs: 4.2 10*3/uL (ref 1.7–7.7)
Platelets: 247 10*3/uL (ref 150–400)
RBC: 4 MIL/uL (ref 3.87–5.11)
RDW: 14.2 % (ref 11.5–15.5)
WBC: 7.6 10*3/uL (ref 4.0–10.5)

## 2014-11-05 LAB — URINE MICROSCOPIC-ADD ON

## 2014-11-05 LAB — I-STAT CHEM 8, ED
BUN: 19 mg/dL (ref 6–20)
CALCIUM ION: 1.12 mmol/L (ref 1.12–1.23)
CREATININE: 1.2 mg/dL — AB (ref 0.44–1.00)
Chloride: 107 mmol/L (ref 101–111)
GLUCOSE: 107 mg/dL — AB (ref 65–99)
HCT: 39 % (ref 36.0–46.0)
HEMOGLOBIN: 13.3 g/dL (ref 12.0–15.0)
Potassium: 3.6 mmol/L (ref 3.5–5.1)
Sodium: 141 mmol/L (ref 135–145)
TCO2: 19 mmol/L (ref 0–100)

## 2014-11-05 LAB — I-STAT VENOUS BLOOD GAS, ED
ACID-BASE DEFICIT: 8 mmol/L — AB (ref 0.0–2.0)
Bicarbonate: 17.8 mEq/L — ABNORMAL LOW (ref 20.0–24.0)
O2 SAT: 54 %
TCO2: 19 mmol/L (ref 0–100)
pCO2, Ven: 36.3 mmHg — ABNORMAL LOW (ref 45.0–50.0)
pH, Ven: 7.298 (ref 7.250–7.300)
pO2, Ven: 31 mmHg (ref 30.0–45.0)

## 2014-11-05 LAB — PROTIME-INR
INR: 1.61 — ABNORMAL HIGH (ref 0.00–1.49)
Prothrombin Time: 19.2 seconds — ABNORMAL HIGH (ref 11.6–15.2)

## 2014-11-05 LAB — RAPID URINE DRUG SCREEN, HOSP PERFORMED
AMPHETAMINES: NOT DETECTED
BARBITURATES: NOT DETECTED
BENZODIAZEPINES: NOT DETECTED
COCAINE: NOT DETECTED
Opiates: NOT DETECTED
TETRAHYDROCANNABINOL: NOT DETECTED

## 2014-11-05 LAB — I-STAT CG4 LACTIC ACID, ED: LACTIC ACID, VENOUS: 1.5 mmol/L (ref 0.5–2.0)

## 2014-11-05 LAB — TROPONIN I: Troponin I: <0.03 ng/mL (ref <0.031)

## 2014-11-05 LAB — ETHANOL

## 2014-11-05 LAB — CBG MONITORING, ED: Glucose-Capillary: 99 mg/dL (ref 65–99)

## 2014-11-05 MED ORDER — DEXTROSE 5 % IV SOLN
2.0000 g | Freq: Once | INTRAVENOUS | Status: AC
  Administered 2014-11-05: 2 g via INTRAVENOUS
  Filled 2014-11-05: qty 2

## 2014-11-05 MED ORDER — SODIUM CHLORIDE 0.9 % IV BOLUS (SEPSIS)
1000.0000 mL | Freq: Once | INTRAVENOUS | Status: AC
  Administered 2014-11-05: 1000 mL via INTRAVENOUS

## 2014-11-05 MED ORDER — SODIUM CHLORIDE 0.9 % IV SOLN: 100.0000 mL/h | INTRAVENOUS | Status: DC

## 2014-11-05 MED ORDER — ASPIRIN EC 81 MG PO TBEC: 81.0000 mg | DELAYED_RELEASE_TABLET | Freq: Every day | ORAL | Status: DC

## 2014-11-05 MED ORDER — SODIUM CHLORIDE 0.9 % IV BOLUS (SEPSIS)
500.0000 mL | Freq: Once | INTRAVENOUS | Status: AC
  Administered 2014-11-05: 500 mL via INTRAVENOUS

## 2014-11-05 MED ORDER — VANCOMYCIN HCL IN DEXTROSE 1-5 GM/200ML-% IV SOLN
1000.0000 mg | Freq: Once | INTRAVENOUS | Status: AC
  Administered 2014-11-06: 1000 mg via INTRAVENOUS
  Filled 2014-11-05: qty 200

## 2014-11-05 MED ORDER — LEVOFLOXACIN IN D5W 750 MG/150ML IV SOLN
750.0000 mg | Freq: Once | INTRAVENOUS | Status: AC
  Administered 2014-11-06: 750 mg via INTRAVENOUS
  Filled 2014-11-05: qty 150

## 2014-11-05 NOTE — Consult Note (Signed)
Referring Physician: ED    Chief Complaint: right sided weakness, acute onset  HPI:                                                                                                                                         [name] is an 59 y.o. female with a past medical history significant for HTN, migraine, fibromyalgia, s/p resection left frontal glioma in 1993, epilepsy s/p temporal lobectomy in 2004, small nonhemorrhagic infarct left periatrial region in 2015, post radiation parkinsonism, brought in by EMS due to acute onset right sided weakness. Patient lives at home with her husband at at baseline has limited mobility. As per EMS, husband was helping patient to go to the toilet when he noticed that she was not moving well the right side and was altered. He noticed a strong smell of urine like when she had prior UTI's. EMS noted SBP low 80's, weakness right side, and decreased responsiveness that improved on route the ED. Initial NIHSS 5. CT brain without acute abnormality.  Date last known well: 11/05/14 Time last known well: 2015 tPA Given: no, prior brain surgery x 2. NIHSS: 5 MRS: 4  Past Medical History  Diagnosis Date  . Brain cancer     Frontal lobe, 1993 and 2005  . Migraines   . Morton neuroma   . Seizures   . Fibromyalgia   . Right fibular fracture 07/12/2011  . 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 Anemia 03/06/2013  . Secondary Parkinson disease   . Bladder spasm   . Hyperchloremia   . Hypertension   . GERD (gastroesophageal reflux disease)   . Stroke     Past Surgical History  Procedure Laterality Date  . Appendectomy  1976  . Excision morton's neuroma      right foot  . Brain cancer      1993 and 2005  . Orif ankle fracture  07/12/2011    Procedure: OPEN REDUCTION INTERNAL FIXATION (ORIF) ANKLE FRACTURE;  Surgeon: Johnny Bridge, MD;  Location: [place];  Service: Orthopedics;   Laterality: Right;  . Cholecystectomy  08/02/2013    Procedure: LAPAROSCOPIC CHOLECYSTECTOMY;  Surgeon: Gwenyth Ober, MD;  Location: [facility] OR;  Service: General;;  . Esophagogastroduodenoscopy N/A 12/28/2013    Procedure: ESOPHAGOGASTRODUODENOSCOPY (EGD);  Surgeon: Inda Castle, MD;  Location: [place];  Service: Endoscopy;  Laterality: N/A;  . Savory dilation N/A 12/28/2013    Procedure: SAVORY DILATION;  Surgeon: Inda Castle, MD;  Location: [place];  Service: Endoscopy;  Laterality: N/A;    Family History  Problem Relation Age of Onset  . Diabetes Mother   . Diabetes Brother   . Hypertension Mother   . Hypertension Father    Social History:  reports that she has never smoked.  She has never used smokeless tobacco. She reports that she does not drink alcohol or use illicit drugs.  Allergies:  Allergies  Allergen Reactions  . Versed [Midazolam] Other (See Comments) and Anaphylaxis    Severe respiratory depression, had to be masked in preop holding.  . Ciprofloxacin     REACTION: NAUSEA AND VOMITING  . Cyclobenzaprine Itching    Other reaction(s): Itching / Pruritis (ALLERGY/intolerance)  . Methocarbamol     REACTION: itching Patient currently tolerating it as its on her home med list.  . Metronidazole Other (See Comments)    Unknown   . Penicillins Rash    REACTION: RASH    Medications:                                                                                                                           I have reviewed the patient's current medications.  ROS:                                                                                                                                       History obtained from chart review and the patient  General ROS: negative for - chills, fatigue, fever, night sweats, or weight loss Psychological ROS: negative for - behavioral disorder, hallucinations, mood swings or suicidal ideation Ophthalmic ROS: negative for -  blurry vision, double vision, eye pain or loss of vision ENT ROS: negative for - epistaxis, nasal discharge, oral lesions, sore throat, tinnitus or vertigo Allergy and Immunology ROS: negative for - hives or itchy/watery eyes Hematological and Lymphatic ROS: negative for - bleeding problems, bruising or swollen lymph nodes Endocrine ROS: negative for - galactorrhea, hair pattern changes, polydipsia/polyuria or temperature intolerance Respiratory ROS: negative for - cough, hemoptysis, shortness of breath or wheezing Cardiovascular ROS: negative for - chest pain, dyspnea on exertion, edema or irregular heartbeat Gastrointestinal ROS: negative for - abdominal pain, diarrhea, hematemesis, nausea/vomiting or stool incontinence Genito-Urinary ROS: negative for - hematuria Musculoskeletal ROS: negative for - joint swelling Neurological ROS: as noted in HPI Dermatological ROS: negative for rash and skin lesion changes  Physical exam:  Constitutional: well developed, pleasant female in no apparent distress. There were no vitals taken for this visit. Eyes: no jaundice or exophthalmos.  Head: normocephalic. Neck: supple, no bruits, no JVD. Cardiac: no murmurs. Lungs: clear. Abdomen: soft, no tender, no mass. Extremities: no edema,  clubbing, or cyanosis.  Skin: no rash  Neurologic Examination:                                                                                                      General: NAD Mental Status: Alert and awake but disoriented to place-year. Slow answering questions, mild dysarthria without evidence of aphasia.  Able to follow 3 step commands without difficulty. Cranial Nerves: II: Discs flat bilaterally; Visual fields grossly normal, pupils equal, round, reactive to light and accommodation III,IV, VI: ptosis not present, extra-ocular motions intact bilaterally V,VII: smile symmetric, facial light touch sensation normal bilaterally VIII: hearing normal bilaterally IX,X:  uvula rises symmetrically XI: bilateral shoulder shrug XII: midline tongue extension without atrophy or fasciculations Motor: Right : Upper extremity   5/5    Left:     Upper extremity   5/5  Lower extremity   5/5     Lower extremity   5/5 Tone and bulk:normal tone throughout; no atrophy noted Sensory: Pinprick and light touch intact throughout, bilaterally Deep Tendon Reflexes:  Right: Upper Extremity   Left: Upper extremity   biceps (C-5 to C-6) 2/4   biceps (C-5 to C-6) 2/4 tricep (C7) 2/4    triceps (C7) 2/4 Brachioradialis (C6) 2/4  Brachioradialis (C6) 2/4  Lower Extremity Lower Extremity  quadriceps (L-2 to L-4) 2/4   quadriceps (L-2 to L-4) 2/4 Achilles (S1) 2/4   Achilles (S1) 2/4  Plantars: Right: downgoing   Left: downgoing Cerebellar: normal finger-to-nose,  normal heel-to-shin test Gait:  No tested due to multiple leads    Results for orders placed or performed during the hospital encounter of 11/05/14 (from the past 48 hour(s))  CBC WITH DIFFERENTIAL     Status: Abnormal   Collection Time: 11/05/14  9:45 PM  Result Value Ref Range   WBC 7.6 4.0 - 10.5 K/uL   RBC 4.00 3.87 - 5.11 MIL/uL   Hemoglobin 11.8 (L) 12.0 - 15.0 g/dL   HCT 36.5 36.0 - 46.0 %   MCV 91.3 78.0 - 100.0 fL   MCH 29.5 26.0 - 34.0 pg   MCHC 32.3 30.0 - 36.0 g/dL   RDW 14.2 11.5 - 15.5 %   Platelets 247 150 - 400 K/uL   Neutrophils Relative % 55 %   Neutro Abs 4.2 1.7 - 7.7 K/uL   Lymphocytes Relative 32 %   Lymphs Abs 2.4 0.7 - 4.0 K/uL   Monocytes Relative 10 %   Monocytes Absolute 0.8 0.1 - 1.0 K/uL   Eosinophils Relative 2 %   Eosinophils Absolute 0.1 0.0 - 0.7 K/uL   Basophils Relative 1 %   Basophils Absolute 0.0 0.0 - 0.1 K/uL  I-stat troponin, ED     Status: None   Collection Time: 11/05/14  9:50 PM  Result Value Ref Range   Troponin i, poc 0.00 0.00 - 0.08 ng/mL   Comment 3            Comment: Due to the release kinetics of cTnI, a negative result within the first  hours of the onset of  symptoms does not rule out myocardial infarction with certainty. If myocardial infarction is still suspected, repeat the test at appropriate intervals.   I-stat chem 8, ed     Status: Abnormal   Collection Time: 11/05/14  9:51 PM  Result Value Ref Range   Sodium 141 135 - 145 mmol/L   Potassium 3.6 3.5 - 5.1 mmol/L   Chloride 107 101 - 111 mmol/L   BUN 19 6 - 20 mg/dL   Creatinine, Ser 1.20 (H) 0.44 - 1.00 mg/dL   Glucose, Bld 107 (H) 65 - 99 mg/dL   Calcium, Ion 1.12 1.12 - 1.23 mmol/L   TCO2 19 0 - 100 mmol/L   Hemoglobin 13.3 12.0 - 15.0 g/dL   HCT 39.0 36.0 - 46.0 %   Ct Head Wo Contrast  11/05/2014  CLINICAL DATA:  Right-sided weakness. History of resection for malignancy. EXAM: CT HEAD WITHOUT CONTRAST TECHNIQUE: Contiguous axial images were obtained from the base of the skull through the vertex without intravenous contrast. COMPARISON:  MRI 12/27/2013, CT 02/10/2009. FINDINGS: There is no intracranial hemorrhage, mass or evidence of acute infarction. There is encephalomalacia and prior resection in the left frontal lobe, stable. White matter hypodensities, left greater than right, are unchanged and likely represent treatment related changes. No significant extra-axial fluid collection. No significant skeletal abnormalities. IMPRESSION: No acute findings. There are stable treatment related changes and prior left frontal resection. Chronic hemispheric white matter hypodensities likely represent treatment related changes. Critical Value/emergent results were called by telephone at the time of interpretation on 11/05/2014 at 10:06 pm to Dr. Armida Sans, who verbally acknowledged these results. Electronically Signed   By: Andreas Newport M.D.   On: 11/05/2014 22:08    Assessment: 59 y.o. female brought in as a code stroke due to acute onset right sided weakness which is not present any longer. Initial NIHSS 5, CT brain without acute abnormality. Patient was within the  window for iv tpa but has a history of brain surgery x 2 which contraindicates iv thrombolysis. Her right sided weakness resolved, and overall her exam is not compatible with large artery occlusion. Admit to medicine and complete stroke work up. Stroke team will follow up tomorrow.  Stroke Risk Factors - HTN  Plan: 1. HgbA1c, fasting lipid panel 2. MRI, MRA  of the brain without contrast 3. Echocardiogram 4. Carotid dopplers 5. Prophylactic therapy-aspirin 6. Risk factor modification 7. Telemetry monitoring 8. Frequent neuro checks 9. PT/OT SLP 10. NPO  Dorian Pod, MD [facility] [phone]  11/05/2014, 10:11 PM

## 2014-11-05 NOTE — Code Documentation (Signed)
Code stroke called at 2120 for this pt LSW at 2015 hrs.  Pt has hx of Brain surgery for a frontal glioma in 1993 and temporal lobectomy in 2004.  Her husband was assisting her to the bedside commode when he noted her to be confused and having difficulty standing and pivoting with her right leg and right arm.  EMS was summoned.  Her CBG was 148    And BP 88/60 treated with [place] bolus x 2 by ems. Pt arrived [facility] at 2140, was cleared for CT by Dr Vanita Panda  At 2141, arriving at [place] at 2145.  CT HEAD: Report of no acute findings was called to DR [facility] at 2206 She was taken to Trauma C where her NIHSS scored 5  with points given for LOC Questioins (2), Visual (1), Ataxia (1) and dysarthria (1)  Due to her brain surgery history  pt is not a candidate for TPA.  Will be admitted to Medicine service

## 2014-11-05 NOTE — ED Notes (Signed)
Pt brought to ED by [facility] from home for new onset of right side weakness and mental status change, last seen well at 2015. Pt had hx of CVA, 2 brain sx one for tumor removal and second for epilepsy disorder and parkinson dz. Vitals on [facility] arrival BP 88/62, 1 L of NS given BP up to 111/80, HR 95, O2 saturation 98% on 3L.Marland Kitchen

## 2014-11-05 NOTE — ED Provider Notes (Signed)
Arrival Date & Time: 11/05/14 & 2140 History   Chief Complaint  Patient presents with  . Code Stroke   HPI [name] is a 59 y.o. female with concerns of event consistent with CVA vs TIA that occurred around 8PM this evening. blood clots/clotting disorders.  PMHx remarkable for brain tumor with multiple resections and seizure disorder on keppra and parkinsonism, HTN. A full HPI, Past Medical/Surgical, Family, and Social history and review of systems could not be completed due to limitations of mental status, confusion and acuity of illness. Therefore the E&M emergency caveat is invoked. I made several attempts to obtain HPI from other sources. The limited supplemental history I obtained from family and Neurology stroke team was used in my medical decision making.   Past Medical History  I reviewed & agree with nursing's documentation on PMHx, PSHx, SHx and FHx. Past Medical History  Diagnosis Date  . Brain cancer ([place])     Frontal lobe, 1993 and 2005  . Migraines   . Morton neuroma   . Seizures ([place])   . Fibromyalgia   . Right fibular fracture 07/12/2011  . 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 Anemia 03/06/2013  . 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 Bladder spasm   . Hyperchloremia   . Hypertension   . GERD (gastroesophageal reflux disease)   . Stroke [facility])    Past Surgical History  Procedure Laterality Date  . Appendectomy  1976  . Excision morton's neuroma      right foot  . Brain cancer      1993 and 2005  . Orif ankle fracture  07/12/2011    Procedure: OPEN REDUCTION INTERNAL FIXATION (ORIF) ANKLE FRACTURE;  Surgeon: Johnny Bridge, MD;  Location: [place];  Service: Orthopedics;  Laterality: Right;  . Cholecystectomy  08/02/2013    Procedure: LAPAROSCOPIC CHOLECYSTECTOMY;  Surgeon: Gwenyth Ober, MD;  Location: [facility] OR;  Service: General;;  . Esophagogastroduodenoscopy N/A 12/28/2013   Procedure: ESOPHAGOGASTRODUODENOSCOPY (EGD);  Surgeon: Inda Castle, MD;  Location: [place];  Service: Endoscopy;  Laterality: N/A;  . Savory dilation N/A 12/28/2013    Procedure: SAVORY DILATION;  Surgeon: Inda Castle, MD;  Location: [place];  Service: Endoscopy;  Laterality: N/A;   Social History   Social History  . Marital Status: Married    Spouse Name: N/A  . Number of Children: N/A  . Years of Education: N/A   Social History Main Topics  . Smoking status: Never Smoker   . Smokeless tobacco: Never Used  . Alcohol Use: No  . Drug Use: No  . Sexual Activity: Not Currently   Other Topics Concern  . None   Social History Narrative   Family History  Problem Relation Age of Onset  . Diabetes Mother   . Diabetes Brother   . Hypertension Mother   . Hypertension Father     Review of Systems  ROS limited as stated above in HPI.  Allergies  Versed; Ciprofloxacin; Cyclobenzaprine; Methocarbamol; Metronidazole; and Penicillins  Home Medications   Prior to Admission medications   Medication Sig Start Date End Date Taking? Authorizing Provider  aspirin EC 81 MG EC tablet Take 1 tablet (81 mg total) by mouth daily. 12/29/13  Yes Bonnielee Haff, MD  aspirin-acetaminophen-caffeine (EXCEDRIN MIGRAINE) [phone] MG per tablet Take by mouth every 6 (six) hours as needed for headache.   Yes  Historical Provider, MD  CRESTOR 10 MG tablet Take 1 tablet by mouth   every Monday, Wednesday,   Friday 04/14/14  Yes Midge Minium, MD  DULoxetine (CYMBALTA) 60 MG capsule TAKE 1 CAPSULE BY MOUTH DAILY 05/01/14  Yes Midge Minium, MD  fenofibrate 160 MG tablet Take 1 tablet (160 mg total) by mouth daily. 07/03/14  Yes Midge Minium, MD  fesoterodine (TOVIAZ) 8 MG TB24 tablet Take 8 mg by mouth daily.   Yes Historical Provider, MD  levETIRAcetam (KEPPRA) 500 MG tablet Take 1,500 mg by mouth 2 (two) times daily.    Yes Historical Provider, MD  Linaclotide (LINZESS) 145  MCG CAPS capsule Take 145 mcg by mouth daily as needed (constipation).    Yes Historical Provider, MD  methocarbamol (ROBAXIN) 500 MG tablet Take 500 mg by mouth daily as needed for muscle spasms.   Yes Historical Provider, MD  nitrofurantoin (MACRODANTIN) 50 MG capsule Take 50 mg by mouth 2 (two) times daily. Maintenance   Yes Historical Provider, MD  omeprazole (PRILOSEC) 20 MG capsule TAKE 1 CAPSULE BY MOUTH DAILY 04/12/14  Yes Midge Minium, MD  pregabalin (LYRICA) 150 MG capsule Take 1 capsule (150 mg total) by mouth 2 (two) times daily. 07/31/14  Yes Midge Minium, MD  promethazine (PHENERGAN) 25 MG tablet Take 1 tablet (25 mg total) by mouth every 6 (six) hours as needed for nausea or vomiting. 08/28/14  Yes Midge Minium, MD  TRIMETHOPRIM PO Take 100 mg by mouth daily. When active UTI   Yes Historical Provider, MD    Physical Exam  BP 110/41 mmHg  Pulse 85  Temp(Src) 99.7 F (37.6 C) (Oral)  Resp 20  Ht 5\' 2"  (1.575 m)  Wt 204 lb 2.3 oz (92.6 kg)  BMI 37.33 kg/m2  SpO2 96% Physical Exam  Constitutional: She is oriented to person, place, and time. She appears well-developed and well-nourished. She appears lethargic. She appears ill.  HENT:  Head: Normocephalic and atraumatic.  Right Ear: External ear normal.  Left Ear: External ear normal.  Eyes: Pupils are equal, round, and reactive to light. No scleral icterus.  Neck: Normal range of motion. Neck supple. No tracheal deviation present.  Cardiovascular: Normal heart sounds and intact distal pulses.   No murmur heard. Pulmonary/Chest: Effort normal and breath sounds normal. No stridor. No respiratory distress. She has no wheezes. She has no rales.  Abdominal: Soft. Bowel sounds are normal. She exhibits no distension. There is no tenderness. There is no rebound and no guarding.  Musculoskeletal: Normal range of motion.  Neurological: She is oriented to person, place, and time. She has normal reflexes. She appears  lethargic. No cranial nerve deficit or sensory deficit.  Strength bilaterally 3/5 in LE.   Skin: Skin is warm and dry. No pallor.  Psychiatric: She has a normal mood and affect. Her behavior is normal.  Nursing note and vitals reviewed.   ED Course  Procedures Labs Review Labs Reviewed  COMPREHENSIVE METABOLIC PANEL - Abnormal; Notable for the following:    CO2 18 (*)    Glucose, Bld 108 (*)    Creatinine, Ser 1.30 (*)    ALT 13 (*)    Alkaline Phosphatase 33 (*)    GFR calc non Af Amer 45 (*)    GFR calc Af Amer 52 (*)    All other components within normal limits  CBC WITH DIFFERENTIAL/PLATELET - Abnormal; Notable for the following:    Hemoglobin 11.8 (*)  All other components within normal limits  URINALYSIS, ROUTINE W REFLEX MICROSCOPIC (NOT AT [facility]) - Abnormal; Notable for the following:    APPearance TURBID (*)    Hgb urine dipstick MODERATE (*)    Protein, ur 30 (*)    Leukocytes, UA LARGE (*)    All other components within normal limits  PROTIME-INR - Abnormal; Notable for the following:    Prothrombin Time 19.2 (*)    INR 1.61 (*)    All other components within normal limits  T4, FREE - Abnormal; Notable for the following:    Free T4 0.59 (*)    All other components within normal limits  AMMONIA - Abnormal; Notable for the following:    Ammonia 55 (*)    All other components within normal limits  URINE MICROSCOPIC-ADD ON - Abnormal; Notable for the following:    Bacteria, UA MANY (*)    All other components within normal limits  CBC - Abnormal; Notable for the following:    RBC 3.54 (*)    Hemoglobin 10.4 (*)    HCT 32.2 (*)    All other components within normal limits  COMPREHENSIVE METABOLIC PANEL - Abnormal; Notable for the following:    Creatinine, Ser 1.23 (*)    Calcium 8.5 (*)    Total Protein 5.9 (*)    Albumin 3.3 (*)    Alkaline Phosphatase 32 (*)    GFR calc non Af Amer 48 (*)    GFR calc Af Amer 55 (*)    All other components within normal  limits  PROTIME-INR - Abnormal; Notable for the following:    Prothrombin Time 16.8 (*)    All other components within normal limits  LIPID PANEL - Abnormal; Notable for the following:    HDL 33 (*)    All other components within normal limits  I-STAT CHEM 8, ED - Abnormal; Notable for the following:    Creatinine, Ser 1.20 (*)    Glucose, Bld 107 (*)    All other components within normal limits  I-STAT VENOUS BLOOD GAS, ED - Abnormal; Notable for the following:    pCO2, Ven 36.3 (*)    Bicarbonate 17.8 (*)    Acid-base deficit 8.0 (*)    All other components within normal limits  CULTURE, BLOOD (ROUTINE X 2)  CULTURE, BLOOD (ROUTINE X 2)  URINE CULTURE  MRSA PCR SCREENING  ETHANOL  TROPONIN I  URINE RAPID DRUG SCREEN, HOSP PERFORMED  HEMOGLOBIN A1C  TSH  LACTIC ACID, PLASMA  LACTIC ACID, PLASMA  CORTISOL-AM, BLOOD  ACTH  CBC WITH DIFFERENTIAL/PLATELET  COMPREHENSIVE METABOLIC PANEL  MAGNESIUM  I-STAT TROPOININ, ED  CBG MONITORING, ED  I-STAT CG4 LACTIC ACID, ED  I-STAT TROPOININ, ED  I-STAT CHEM 8, ED    Imaging Review No results found.  Laboratory and Imaging results were personally reviewed by myself and used in the medical decision making of this patient's treatment and disposition.  EKG Interpretation  EKG Interpretation  Date/Time:  Sunday November 05 2014 22:13:34 EDT Ventricular Rate:  91 PR Interval:  150 QRS Duration: 126 QT Interval:  393 QTC Calculation: 483 R Axis:   -60 Text Interpretation:  Sinus rhythm Nonspecific IVCD with LAD Consider anterior infarct Nonspecific T abnormalities, lateral leads ED PHYSICIAN INTERPRETATION AVAILABLE IN [facility] Confirmed by TEST, Record ([id number]) on 11/06/2014 7:15:58 AM      MDM  SALVATRICE MORANDI is a 59 y.o. female with H&P as above. ED clinical course as follows:  Patient presents to the ED as a CODE STROKE for neurologic deficits, including right sided weakness and AMS.  Patient was quickly assessed  by myself and attending. Stroke team contacted and was immediately available. Thorough neurological exam was performed with pertinent findings including bilateral LE weakness without pathologic reflexes.  CT scanner was made available and the patient was rapidly transported. Head CT w/ stroke protocol was performed and in summary showed  NAICA. Labs performed with pertinent lab findings including concerning UA that is difficult to interpret in light of chronic ABx use. Possible sepsis given AMS therefore concern for partially treated urosepsis therefore administered 30 cc/kg of IVF and IV ABx.   Considered other causes of neurological findings including infectious (encephalopathy, meningitis), tumor/abscess, toxic causes (hypoglycemia, renal failure, hepatic failure, toxic ingestion), other neurologic disorders (seizure, migraine, Todd paralysis, GBS, status epilepticus, BPPV, disc herniation, diabetic neuropathy), psychiatric causes (conversion) and trauma (ICH, spinal injury).   Patient admitted to Hospitalist for further evaluation and management of AMS and weakness of unknown source with plan to obtain MRI brain as inpatient.  Clinical Impression:  1. Stroke with cerebral ischemia ([place])   2. Stroke with cerebral ischemia ([place])   3. Stroke with cerebral ischemia [facility])    Patient care discussed with Dr. Vanita Panda, who oversaw their evaluation & treatment & voiced agreement. House Officer: Voncille Lo, MD, Emergency Medicine.  Voncille Lo, MD 11/08/14 [id number]  Carmin Muskrat, MD 11/09/14 0000

## 2014-11-06 ENCOUNTER — Inpatient Hospital Stay (HOSPITAL_COMMUNITY): Payer: 59

## 2014-11-06 ENCOUNTER — Encounter (HOSPITAL_COMMUNITY): Payer: Self-pay | Admitting: Family Medicine

## 2014-11-06 DIAGNOSIS — I9589 Other hypotension: Secondary | ICD-10-CM | POA: Diagnosis present

## 2014-11-06 DIAGNOSIS — I69354 Hemiplegia and hemiparesis following cerebral infarction affecting left non-dominant side: Secondary | ICD-10-CM | POA: Diagnosis not present

## 2014-11-06 DIAGNOSIS — D509 Iron deficiency anemia, unspecified: Secondary | ICD-10-CM | POA: Diagnosis not present

## 2014-11-06 DIAGNOSIS — I952 Hypotension due to drugs: Secondary | ICD-10-CM | POA: Diagnosis not present

## 2014-11-06 DIAGNOSIS — R531 Weakness: Secondary | ICD-10-CM | POA: Diagnosis present

## 2014-11-06 DIAGNOSIS — N179 Acute kidney failure, unspecified: Secondary | ICD-10-CM | POA: Diagnosis not present

## 2014-11-06 DIAGNOSIS — I950 Idiopathic hypotension: Secondary | ICD-10-CM | POA: Diagnosis not present

## 2014-11-06 DIAGNOSIS — G212 Secondary parkinsonism due to other external agents: Secondary | ICD-10-CM

## 2014-11-06 DIAGNOSIS — D500 Iron deficiency anemia secondary to blood loss (chronic): Secondary | ICD-10-CM | POA: Diagnosis not present

## 2014-11-06 DIAGNOSIS — M797 Fibromyalgia: Secondary | ICD-10-CM | POA: Diagnosis present

## 2014-11-06 DIAGNOSIS — K219 Gastro-esophageal reflux disease without esophagitis: Secondary | ICD-10-CM | POA: Diagnosis present

## 2014-11-06 DIAGNOSIS — N390 Urinary tract infection, site not specified: Secondary | ICD-10-CM | POA: Diagnosis present

## 2014-11-06 DIAGNOSIS — N3000 Acute cystitis without hematuria: Secondary | ICD-10-CM

## 2014-11-06 DIAGNOSIS — R29705 NIHSS score 5: Secondary | ICD-10-CM | POA: Diagnosis present

## 2014-11-06 DIAGNOSIS — R55 Syncope and collapse: Secondary | ICD-10-CM | POA: Diagnosis present

## 2014-11-06 DIAGNOSIS — Z79899 Other long term (current) drug therapy: Secondary | ICD-10-CM | POA: Diagnosis not present

## 2014-11-06 DIAGNOSIS — I10 Essential (primary) hypertension: Secondary | ICD-10-CM | POA: Diagnosis not present

## 2014-11-06 DIAGNOSIS — E669 Obesity, unspecified: Secondary | ICD-10-CM | POA: Diagnosis present

## 2014-11-06 DIAGNOSIS — Z6837 Body mass index (BMI) 37.0-37.9, adult: Secondary | ICD-10-CM | POA: Diagnosis not present

## 2014-11-06 DIAGNOSIS — D649 Anemia, unspecified: Secondary | ICD-10-CM | POA: Diagnosis present

## 2014-11-06 DIAGNOSIS — G40909 Epilepsy, unspecified, not intractable, without status epilepticus: Secondary | ICD-10-CM | POA: Diagnosis not present

## 2014-11-06 DIAGNOSIS — I639 Cerebral infarction, unspecified: Secondary | ICD-10-CM | POA: Diagnosis not present

## 2014-11-06 DIAGNOSIS — E2749 Other adrenocortical insufficiency: Secondary | ICD-10-CM | POA: Diagnosis present

## 2014-11-06 DIAGNOSIS — R2990 Unspecified symptoms and signs involving the nervous system: Secondary | ICD-10-CM | POA: Diagnosis not present

## 2014-11-06 DIAGNOSIS — A419 Sepsis, unspecified organism: Secondary | ICD-10-CM | POA: Insufficient documentation

## 2014-11-06 DIAGNOSIS — G40409 Other generalized epilepsy and epileptic syndromes, not intractable, without status epilepticus: Secondary | ICD-10-CM | POA: Diagnosis not present

## 2014-11-06 DIAGNOSIS — C711 Malignant neoplasm of frontal lobe: Secondary | ICD-10-CM | POA: Diagnosis not present

## 2014-11-06 DIAGNOSIS — L8992 Pressure ulcer of unspecified site, stage 2: Secondary | ICD-10-CM | POA: Diagnosis present

## 2014-11-06 DIAGNOSIS — I6789 Other cerebrovascular disease: Secondary | ICD-10-CM

## 2014-11-06 DIAGNOSIS — E785 Hyperlipidemia, unspecified: Secondary | ICD-10-CM | POA: Diagnosis present

## 2014-11-06 DIAGNOSIS — Z7982 Long term (current) use of aspirin: Secondary | ICD-10-CM | POA: Diagnosis not present

## 2014-11-06 DIAGNOSIS — G219 Secondary parkinsonism, unspecified: Secondary | ICD-10-CM | POA: Diagnosis present

## 2014-11-06 DIAGNOSIS — Z85841 Personal history of malignant neoplasm of brain: Secondary | ICD-10-CM | POA: Diagnosis not present

## 2014-11-06 DIAGNOSIS — R410 Disorientation, unspecified: Secondary | ICD-10-CM | POA: Diagnosis not present

## 2014-11-06 LAB — LIPID PANEL
CHOLESTEROL: 107 mg/dL (ref 0–200)
HDL: 33 mg/dL — ABNORMAL LOW (ref >40)
LDL CALC: 56 mg/dL (ref 0–99)
TRIGLYCERIDES: 91 mg/dL (ref <150)
Total CHOL/HDL Ratio: 3.2 RATIO
VLDL: 18 mg/dL (ref 0–40)

## 2014-11-06 LAB — COMPREHENSIVE METABOLIC PANEL
ALK PHOS: 32 U/L — AB (ref 38–126)
ALT: 14 U/L (ref 14–54)
ANION GAP: 7 (ref 5–15)
AST: 28 U/L (ref 15–41)
Albumin: 3.3 g/dL — ABNORMAL LOW (ref 3.5–5.0)
BUN: 16 mg/dL (ref 6–20)
CALCIUM: 8.5 mg/dL — AB (ref 8.9–10.3)
CO2: 23 mmol/L (ref 22–32)
Chloride: 111 mmol/L (ref 101–111)
Creatinine, Ser: 1.23 mg/dL — ABNORMAL HIGH (ref 0.44–1.00)
GFR, EST AFRICAN AMERICAN: 55 mL/min — AB (ref >60)
GFR, EST NON AFRICAN AMERICAN: 48 mL/min — AB (ref >60)
Glucose, Bld: 89 mg/dL (ref 65–99)
Potassium: 4.2 mmol/L (ref 3.5–5.1)
SODIUM: 141 mmol/L (ref 135–145)
TOTAL PROTEIN: 5.9 g/dL — AB (ref 6.5–8.1)
Total Bilirubin: 0.5 mg/dL (ref 0.3–1.2)

## 2014-11-06 LAB — CBC
HCT: 32.2 % — ABNORMAL LOW (ref 36.0–46.0)
HEMOGLOBIN: 10.4 g/dL — AB (ref 12.0–15.0)
MCH: 29.4 pg (ref 26.0–34.0)
MCHC: 32.3 g/dL (ref 30.0–36.0)
MCV: 91 fL (ref 78.0–100.0)
Platelets: 211 10*3/uL (ref 150–400)
RBC: 3.54 MIL/uL — ABNORMAL LOW (ref 3.87–5.11)
RDW: 14.1 % (ref 11.5–15.5)
WBC: 4.4 10*3/uL (ref 4.0–10.5)

## 2014-11-06 LAB — PROTIME-INR
INR: 1.35 (ref 0.00–1.49)
PROTHROMBIN TIME: 16.8 s — AB (ref 11.6–15.2)

## 2014-11-06 LAB — LACTIC ACID, PLASMA
LACTIC ACID, VENOUS: 1.3 mmol/L (ref 0.5–2.0)
Lactic Acid, Venous: 1.1 mmol/L (ref 0.5–2.0)

## 2014-11-06 LAB — MRSA PCR SCREENING: MRSA by PCR: NEGATIVE

## 2014-11-06 LAB — TSH: TSH: 1.396 u[IU]/mL (ref 0.350–4.500)

## 2014-11-06 LAB — AMMONIA: AMMONIA: 55 umol/L — AB (ref 9–35)

## 2014-11-06 LAB — T4, FREE: FREE T4: 0.59 ng/dL — AB (ref 0.61–1.12)

## 2014-11-06 MED ORDER — PANTOPRAZOLE SODIUM 40 MG PO TBEC
40.0000 mg | DELAYED_RELEASE_TABLET | Freq: Every day | ORAL | Status: DC
  Administered 2014-11-06 – 2014-11-11 (×6): 40 mg via ORAL
  Filled 2014-11-06 (×6): qty 1

## 2014-11-06 MED ORDER — FENOFIBRATE 160 MG PO TABS
160.0000 mg | ORAL_TABLET | Freq: Every day | ORAL | Status: DC
  Administered 2014-11-06 – 2014-11-11 (×6): 160 mg via ORAL
  Filled 2014-11-06 (×6): qty 1

## 2014-11-06 MED ORDER — FESOTERODINE FUMARATE ER 8 MG PO TB24
8.0000 mg | ORAL_TABLET | Freq: Every day | ORAL | Status: DC
  Administered 2014-11-06 – 2014-11-11 (×6): 8 mg via ORAL
  Filled 2014-11-06 (×7): qty 1

## 2014-11-06 MED ORDER — VANCOMYCIN HCL IN DEXTROSE 750-5 MG/150ML-% IV SOLN
750.0000 mg | Freq: Two times a day (BID) | INTRAVENOUS | Status: DC
  Administered 2014-11-06 – 2014-11-08 (×5): 750 mg via INTRAVENOUS
  Filled 2014-11-06 (×7): qty 150

## 2014-11-06 MED ORDER — LEVETIRACETAM 500 MG PO TABS
1500.0000 mg | ORAL_TABLET | Freq: Two times a day (BID) | ORAL | Status: DC
  Administered 2014-11-06 – 2014-11-11 (×11): 1500 mg via ORAL
  Filled 2014-11-06 (×5): qty 3

## 2014-11-06 MED ORDER — ASPIRIN EC 81 MG PO TBEC
81.0000 mg | DELAYED_RELEASE_TABLET | Freq: Every day | ORAL | Status: DC
  Administered 2014-11-06 – 2014-11-11 (×6): 81 mg via ORAL
  Filled 2014-11-06 (×6): qty 1

## 2014-11-06 MED ORDER — SODIUM CHLORIDE 0.9 % IJ SOLN
3.0000 mL | Freq: Two times a day (BID) | INTRAMUSCULAR | Status: DC
  Administered 2014-11-06: 3 mL via INTRAVENOUS
  Administered 2014-11-06: 6 mL via INTRAVENOUS
  Administered 2014-11-06 – 2014-11-11 (×8): 3 mL via INTRAVENOUS

## 2014-11-06 MED ORDER — DEXTROSE 5 % IV SOLN
1.0000 g | Freq: Three times a day (TID) | INTRAVENOUS | Status: DC
  Administered 2014-11-06 – 2014-11-09 (×10): 1 g via INTRAVENOUS
  Filled 2014-11-06 (×13): qty 1

## 2014-11-06 MED ORDER — PREGABALIN 75 MG PO CAPS
150.0000 mg | ORAL_CAPSULE | Freq: Two times a day (BID) | ORAL | Status: DC
  Administered 2014-11-06 – 2014-11-11 (×11): 150 mg via ORAL
  Filled 2014-11-06: qty 3
  Filled 2014-11-06: qty 2
  Filled 2014-11-06: qty 3
  Filled 2014-11-06 (×4): qty 2
  Filled 2014-11-06: qty 3
  Filled 2014-11-06 (×3): qty 2

## 2014-11-06 MED ORDER — PERFLUTREN LIPID MICROSPHERE
1.0000 mL | INTRAVENOUS | Status: AC | PRN
  Administered 2014-11-06: 3 mL via INTRAVENOUS
  Filled 2014-11-06 (×2): qty 10

## 2014-11-06 MED ORDER — SODIUM CHLORIDE 0.9 % IV SOLN
INTRAVENOUS | Status: DC
  Administered 2014-11-06 – 2014-11-07 (×2): via INTRAVENOUS
  Administered 2014-11-07: 125 mL/h via INTRAVENOUS
  Administered 2014-11-08: 11:00:00 via INTRAVENOUS
  Administered 2014-11-08: 50 mL/h via INTRAVENOUS

## 2014-11-06 MED ORDER — ROSUVASTATIN CALCIUM 10 MG PO TABS
10.0000 mg | ORAL_TABLET | ORAL | Status: DC
  Administered 2014-11-06 – 2014-11-10 (×3): 10 mg via ORAL
  Filled 2014-11-06 (×3): qty 1

## 2014-11-06 MED ORDER — DULOXETINE HCL 60 MG PO CPEP
60.0000 mg | ORAL_CAPSULE | Freq: Every day | ORAL | Status: DC
  Administered 2014-11-06 – 2014-11-11 (×6): 60 mg via ORAL
  Filled 2014-11-06 (×6): qty 1

## 2014-11-06 MED ORDER — PROMETHAZINE HCL 25 MG PO TABS
25.0000 mg | ORAL_TABLET | Freq: Four times a day (QID) | ORAL | Status: DC | PRN
  Administered 2014-11-06: 25 mg via ORAL
  Filled 2014-11-06: qty 1

## 2014-11-06 NOTE — H&P (Signed)
History and Physical  Patient Name: [name]     UKG:[id number]    DOB: [birth year]    DOA: 11/05/2014 Referring physician: Voncille Lo, MD PCP: Leslie Asa, MD      Chief Complaint: Syncope  HPI: [name] is a 59 y.o. 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  The patient was in her usual state of health until today at around 8:00 in the evening when her husband was helping her on the commode. He noticed that she closed her eyes and rolled her head back and was unresponsive, not answering questions, and sluggish with movements. EMS noted SBP in the 80s, sluggishness, and ?right-sided weakness so code stroke was called on the way in.  In the ED, stroke team doubted stroke as the cause of her globally decreased sensorium.  A noncontrasted CT head was unchanged from her previous. She was afebrile and had normal WBC. Her blood pressure improved somewhat with fluid bolus and a lactic acid level was normal. The urine was turbid with copious WBC and bacteria and so the patient was started on vancomycin, aztreonam, and Levaquin, and TRH was asked to admit for presumed sepsis from UTI (she was admitted to [facility] for this one year ago).  Of note, the patient was complaining of dysuria 1 week ago and they did take a burst of Bactrim on top of the daily nitrofurantoin last week which was completed and with resolution of symptoms. Also, the patient's last outpatient notes appear to show that her systolic blood pressure was in the 90s as far back as July. Her husband notes that the last few times at the doctors' office staff have had trouble getting her blood pressure with automatic cuffs.  Nonetheless the patient continues to take olmesartan HCTZ daily.     Review of Systems:  Pt complains of fatigue. Pt denies any right-sided weakness, other focal weakness, slurred speech, confusion (she remembers the episode), loss of consciousness, cough, fever, chills, dysuria, urgency, abdominal pain, new skin sores or worsening of her chronic pressure ulcer.  All other systems negative except as just noted or noted in the history of present illness.  Allergies  Allergen Reactions  . Versed [Midazolam] Other (See Comments) and Anaphylaxis    Severe respiratory depression, had to be masked in preop holding.  . Ciprofloxacin     REACTION: NAUSEA AND VOMITING  . Cyclobenzaprine Itching    Other reaction(s): Itching / Pruritis (ALLERGY/intolerance)  . Methocarbamol     REACTION: itching Patient currently tolerating it as its on her home med list.  . Metronidazole Other (See Comments)    Unknown   . Penicillins Rash    REACTION: RASH    Prior to Admission medications   Medication Sig Start Date End Date Taking? Authorizing Provider  aspirin EC 81 MG EC tablet Take 1 tablet (81 mg total) by mouth daily. 12/29/13  Yes Bonnielee Haff, MD  aspirin-acetaminophen-caffeine (EXCEDRIN MIGRAINE) [phone] MG per tablet Take by mouth every 6 (six) hours as needed for headache.   Yes Historical Provider, MD  CRESTOR 10 MG tablet Take 1 tablet by mouth   every Monday, Wednesday,   Friday 04/14/14  Yes Midge Minium, MD  DULoxetine (CYMBALTA) 60 MG capsule  TAKE 1 CAPSULE BY MOUTH DAILY 05/01/14  Yes Midge Minium, MD  fenofibrate 160 MG tablet Take 1 tablet (160 mg total) by mouth daily. 07/03/14  Yes Midge Minium, MD  fesoterodine (TOVIAZ) 8 MG TB24 tablet Take 8 mg by mouth daily.   Yes Historical Provider, MD  levETIRAcetam (KEPPRA) 500 MG tablet Take 1,500 mg by mouth 2 (two) times daily.    Yes Historical Provider, MD  Linaclotide (LINZESS) 145 MCG CAPS capsule Take 145 mcg by  mouth daily as needed (constipation).    Yes Historical Provider, MD  methocarbamol (ROBAXIN) 500 MG tablet Take 500 mg by mouth daily as needed for muscle spasms.   Yes Historical Provider, MD  nitrofurantoin (MACRODANTIN) 50 MG capsule Take 50 mg by mouth 2 (two) times daily. Maintenance   Yes Historical Provider, MD  omeprazole (PRILOSEC) 20 MG capsule TAKE 1 CAPSULE BY MOUTH DAILY 04/12/14  Yes Midge Minium, MD  pregabalin (LYRICA) 150 MG capsule Take 1 capsule (150 mg total) by mouth 2 (two) times daily. 07/31/14  Yes Midge Minium, MD  promethazine (PHENERGAN) 25 MG tablet Take 1 tablet (25 mg total) by mouth every 6 (six) hours as needed for nausea or vomiting. 08/28/14  Yes Midge Minium, MD  TRIMETHOPRIM PO Take 100 mg by mouth daily. When active UTI   Yes Historical Provider, MD    Past Medical History  Diagnosis Date  . Brain cancer ([place])     Frontal lobe, 1993 and 2005  . Migraines   . Morton neuroma   . Seizures ([place])   . Fibromyalgia   . Right fibular fracture 07/12/2011  . 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 Anemia 03/06/2013  . 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 Bladder spasm   . Hyperchloremia   . Hypertension   . GERD (gastroesophageal reflux disease)   . Stroke [facility])     Past Surgical History  Procedure Laterality Date  . Appendectomy  1976  . Excision morton's neuroma      right foot  . Brain cancer      1993 and 2005  . Orif ankle fracture  07/12/2011    Procedure: OPEN REDUCTION INTERNAL FIXATION (ORIF) ANKLE FRACTURE;  Surgeon: Johnny Bridge, MD;  Location: [place];  Service: Orthopedics;  Laterality: Right;  . Cholecystectomy  08/02/2013    Procedure: LAPAROSCOPIC CHOLECYSTECTOMY;  Surgeon: Gwenyth Ober, MD;  Location: [facility] OR;  Service: General;;  . Esophagogastroduodenoscopy N/A 12/28/2013    Procedure: ESOPHAGOGASTRODUODENOSCOPY (EGD);  Surgeon: Inda Castle,  MD;  Location: [place];  Service: Endoscopy;  Laterality: N/A;  . Savory dilation N/A 12/28/2013    Procedure: SAVORY DILATION;  Surgeon: Inda Castle, MD;  Location: [place];  Service: Endoscopy;  Laterality: N/A;    Family history: family history includes Diabetes in her brother and mother; Hypertension in her father and mother.  Social History: Patient lives with her husband. 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 She is a never smoker and does not drink. She is from [place] originally.        Physical Exam: BP 106/50 mmHg  Pulse 80  Temp(Src) 97.4 F (36.3 C) (Rectal)  Resp 15  Ht 5\' 2"  (1.575 m)  Wt 95.255 kg (210 lb)  BMI 38.40 kg/m2  SpO2 97% General appearance:  Overweight debilitated adult female, alert and in no acute distress.   Eyes: Anicteric, conjunctiva pink, lids and lashes normal.     ENT: No nasal deformity, discharge, or epistaxis.  OP moist without lesions.  Upper dentures missing. Skin: Warm and dry.  No jaundice.  No suspicious rashes or lesions on face, neck, upper chest, arms or legs. Cardiac: RRR, nl S1-S2, no murmurs appreciated.  Capillary refill is brisk.  No LE edema.  Radial pulses 2+ and symmetric. Respiratory: Normal respiratory rate and rhythm.  CTAB without rales or wheezes. Abdomen: Abdomen soft without rigidity.  No TTP or suprapubic tenderness. No ascites, distension.   MSK: No deformities or effusions. Neuro: Lying flat on her back.  Oriented to person, place and time.  Sensorium intact and responding to questions, attention normal.  Speech is fluent.  Globally weak.  Wiggles toes but can't lift legs against gravity.  Arm strength 4/5 bilaterally and symmetric.  Cranial nerves 3-12 intact.      Psych: Behavior appropriate.  Affect flat.  No evidence of aural or visual hallucinations or delusions.       Labs on Admission:  The metabolic panel shows normal sodium, potassium. The bicarbonate  is somewhat low. Serum creatinine is elevated at 1.3 mg/dL from a baseline around 0.95 mg/dL. The serum glucose is normal. The transaminases and bilirubin are normal. A troponin is negative. Alcohol level is negative. TSH is pending.  A1c is pending. The INR is elevated at 1.6 The lactic acid level is normal at 1.5 mmol per liter. Urinalysis shows bacteria and leukocytosis and red blood cells. Urine drug screen is negative for anything. The complete blood count shows no leukocytosis or thrombocytopenia. There is a chronic stable anemia.     Radiological Exams on Admission: Personally reviewed: Dg Chest Port 1 View 11/05/2014   No focal opacities.   Ct Head Wo Contrast 11/05/2014   No intracranial bleeding.  Stable changes from old surgery.    EKG: Independently reviewed. Sinus, LAFB.  Similar to previous from 2015.  No ST changes.    Assessment/Plan  1. Hypotension and altered mental status:  This is new.  The patient presents with basically a syncopal episode on the commode.  There was a lot of commotion on presentation because of hypotension, but on careful review, this appears to have been present over the course of months, and I favor that she simply has had syncope from iatrogenic hypotension from ARB/HCTZ.  There was a concern for sepsis in the ED, but on my exam, the patient does not appear septic and does not formally meet SIRS criteria either, although I will cover empirically.    -Admit to stepdown and bolus as necessary -Repeat lactic acid -Telemetry -Hold antihypertensives -Vancomycin and aztreonam as below  Stroke etiology evaluation has been ordered by Neurology:  -Aspirin  -MRI/MRA and echocardiogram are ordered  -Lipids and HgbA1c   2. Possible UTI:  The patient reports dysuria one week ago and now has very infected appearing urine by microscopy.  Nitrofurantoin would seem to cover virtually all GNRs, including pseudomonas, and so I believe  additional coverage for GPCs is a priority, and do not think double coverage of pseudomonas is warranted. -Vancomycin and aztreonam -Hold nitrofurantoin while on IV aztreonam -Follow urine culture  3. AKI: The patient has a small increase in creatinine from previous baseline, presumably from ARB use and hypotension causing mild ischemia. -Fluid resuscitation and repeat BMP  4. HTN:  The patient has a  history of essential hypertension, but has been hypotensive at recent office visits. -Discontinue ARB and HCTZ.   -Follow BP as outpatient.  5. Epilepsy:  Stable.  -Continue home levetiracetam -Seizure precautions  6. Fibromyalgia:  Stable.  -Continue duloxetine and pregabalin   7. GERD:  Stable.  -Continue home PPI  8. Elevated INR: Unclear etiology.   Suspect medication effect. -Verify with repeat.      DVT PPx: SCDs Diet: Regular Consultants: Neurology Code Status: Full, HCPOA is husband Family Communication: Husband at bedside  Medical decision making: What exists of the patient's previous chart was reviewed in depth and the case was discussed with Dr. Freda Munro. Patient seen 12:50 AM on 11/06/2014.  Disposition Plan:  Admit for monitoring BP and fluid resuscitation as necessary.  Empiric antibiotics until urine culture returns.  Stroke etiology eval per Neurology.      Edwin Dada [facility] Pager [phone]

## 2014-11-06 NOTE — Progress Notes (Signed)
Echocardiogram 2D Echocardiogram has been performed.  [name] 11/06/2014, 4:22 PM

## 2014-11-06 NOTE — ED Notes (Signed)
Patient transported to MRI 

## 2014-11-06 NOTE — Progress Notes (Addendum)
[place] TEAM 1 - Stepdown/ICU TEAM PROGRESS NOTE  RAMATOULAYE PACK DGL:[id number] DOB: June 19, 1955 DOA: 11/05/2014 PCP: Annye Asa, MD  Admit HPI / Brief Narrative: 59 y.o. 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  The patient was in her usual state of health when her husband was helping her onto the commode. He noticed that she closed her eyes and rolled her head back and became unresponsive, not answering questions, and sluggish with movements. EMS noted SBP in the 80s, sluggishness, and ?right-sided weakness so code stroke was called on the way in.  In the ED, stroke team doubted stroke as the cause of her globally decreased sensorium. A noncontrasted CT head was unchanged from her previous. She was afebrile and had normal WBC. Her blood pressure improved somewhat with fluid bolus and a lactic acid level was normal. The urine was turbid with copious WBC and bacteria and so the patient was started on vancomycin, aztreonam, and Levaquin, and TRH was asked to admit for presumed sepsis from UTI.  Of note, the patient was complaining of dysuria 1 week ago and did take a burst of Bactrim on top of the daily nitrofurantoin last week with resolution of symptoms. Outpatient notes appear to show that her systolic blood pressure was in the 90s as far back as July. Her husband notes that the last few times at the doctors' office staff have had trouble getting her blood pressure with automatic cuffs. Nonetheless the patient continues to take olmesartan/HCTZ daily.  HPI/Subjective: Pt seen for f/u visit.  Assessment/Plan:  Altered mental status presents with a syncopal episode on the commode - favor syncope  from iatrogenic hypotension from ARB/HCTZ - does not formally meet SIRS criteria   Hypotension  Appears stable at chronic level   Possible UTI very infected appearing urine by microscopy  AKI The patient has a small increase in creatinine from previous baseline, presumably from ARB use and hypotension causing mild ischemia  HTN The patient has a history of essential hypertension, but has been hypotensive at recent office visits - discontinue ARB and HCTZ  Epilepsy  Stable  Fibromyalgia Stable  GERD  Stable  Elevated INR Unclear etiology  Code Status: FULL Family Communication: no family present at time of exam Disposition Plan: SDU  Consultants: Neurology   Procedures: Carotid doppler - 10/31 - no signif B stenosis   Antibiotics: Ceftaz 10/30 > Levaquin 10/30 > vancomycin 10/30 > Aztreonam 10/30 >  DVT prophylaxis: SCDs  Objective: Blood pressure 96/79, pulse 86, temperature 97.3 F (36.3 C), temperature source Oral, resp. rate 20, height 5\' 2"  (1.575 m), weight 85.1 kg (187 lb 9.8 oz), SpO2 100 %.  Intake/Output Summary (Last 24 hours) at 11/06/14 1152 Last data filed at 11/06/14 1000  Gross per 24 hour  Intake    675 ml  Output      0 ml  Net    675 ml   Exam: Pt seen for f/u visit.  Data Reviewed: Basic Metabolic Panel:  Recent Labs Lab 11/05/14 2145 11/05/14 2151 11/06/14 0311  NA 135 141 141  K 3.6 3.6 4.2  CL 103 107 111  CO2 18*  --  23  GLUCOSE 108* 107* 89  BUN 17 19 16   CREATININE 1.30* 1.20*  1.23*  CALCIUM 8.9  --  8.5*    CBC:  Recent Labs Lab 11/05/14 2145 11/05/14 2151 11/06/14 0311  WBC 7.6  --  4.4  NEUTROABS 4.2  --   --   HGB 11.8* 13.3 10.4*  HCT 36.5 39.0 32.2*  MCV 91.3  --  91.0  PLT 247  --  211    Liver Function Tests:  Recent Labs Lab 11/05/14 2145 11/06/14 0311  AST 30 28  ALT 13* 14  ALKPHOS 33* 32*  BILITOT 0.8 0.5  PROT 6.8 5.9*  ALBUMIN 3.8 3.3*    Recent Labs Lab  11/05/14 2320  AMMONIA 55*    Coags:  Recent Labs Lab 11/05/14 2230 11/06/14 0311  INR 1.61* 1.35    Cardiac Enzymes:  Recent Labs Lab 11/05/14 2145  TROPONINI <0.03    CBG:  Recent Labs Lab 11/05/14 2220  GLUCAP 99    Recent Results (from the past 240 hour(s))  Blood Culture (routine x 2)     Status: None (Preliminary result)   Collection Time: 11/05/14 10:30 PM  Result Value Ref Range Status   Specimen Description BLOOD LEFT ANTECUBITAL  Final   Special Requests BOTTLES DRAWN AEROBIC AND ANAEROBIC 5CC   Final   Culture NO GROWTH < 12 HOURS  Final   Report Status PENDING  Incomplete  Blood Culture (routine x 2)     Status: None (Preliminary result)   Collection Time: 11/05/14 11:11 PM  Result Value Ref Range Status   Specimen Description BLOOD RIGHT ANTECUBITAL  Final   Special Requests BOTTLES DRAWN AEROBIC AND ANAEROBIC 5CC  Final   Culture NO GROWTH < 12 HOURS  Final   Report Status PENDING  Incomplete  MRSA PCR Screening     Status: None   Collection Time: 11/06/14  3:19 AM  Result Value Ref Range Status   MRSA by PCR NEGATIVE NEGATIVE Final    Comment:        The GeneXpert MRSA Assay (FDA approved for NASAL specimens only), is one component of a comprehensive MRSA colonization surveillance program. It is not intended to diagnose MRSA infection nor to guide or monitor treatment for MRSA infections.      Studies:   Recent x-ray studies have been reviewed in detail by the Attending Physician  Scheduled Meds:  Scheduled Meds: . aspirin EC  81 mg Oral Daily  . cefTAZidime (FORTAZ)  IV  1 g Intravenous 3 times per day  . DULoxetine  60 mg Oral Daily  . fenofibrate  160 mg Oral Daily  . fesoterodine  8 mg Oral Daily  . levETIRAcetam  1,500 mg Oral BID  . pantoprazole  40 mg Oral Daily  . pregabalin  150 mg Oral BID  . rosuvastatin  10 mg Oral Q M,W,F  . sodium chloride  3 mL Intravenous Q12H  . vancomycin  750 mg Intravenous Q12H     Time spent on care of this patient: No charge   Cherene Altes , MD   [facility] Office  [phone] Pager - Text Page per Shea Evans as per below:  On-Call/Text Page:      [email or web address]      password [email or web address]  If 7PM-7AM, please contact night-coverage [email or web address] Password [email or web address] 11/06/2014, 11:52 AM   LOS: 0 days

## 2014-11-06 NOTE — Progress Notes (Signed)
ANTIBIOTIC CONSULT NOTE - INITIAL  Pharmacy Consult for Vancomycin/Ceftazidime  Indication: rule out sepsis, ?UTI source  Allergies  Allergen Reactions  . Versed [Midazolam] Other (See Comments) and Anaphylaxis    Severe respiratory depression, had to be masked in preop holding.  . Ciprofloxacin     REACTION: NAUSEA AND VOMITING  . Cyclobenzaprine Itching    Other reaction(s): Itching / Pruritis (ALLERGY/intolerance)  . Methocarbamol     REACTION: itching Patient currently tolerating it as its on her home med list.  . Metronidazole Other (See Comments)    Unknown   . Penicillins Rash    REACTION: RASH    Patient Measurements: Height: 5\' 2"  (157.5 cm) Weight: 210 lb (95.255 kg) IBW/kg (Calculated) : 50.1  Vital Signs: Temp: 97.6 F (36.4 C) (10/31 0225) Temp Source: Oral (10/31 0225) BP: 98/60 mmHg (10/31 0225) Pulse Rate: 80 (10/31 0103)  Labs:  Recent Labs  11/05/14 2145 11/05/14 2151  WBC 7.6  --   HGB 11.8* 13.3  PLT 247  --   CREATININE 1.30* 1.20*   Estimated Creatinine Clearance: 55.7 mL/min (by C-G formula based on Cr of 1.2).  Medical History: Past Medical History  Diagnosis Date  . Brain cancer ([place])     Frontal lobe, 1993 and 2005  . Migraines   . Morton neuroma   . Seizures ([place])   . Fibromyalgia   . Right fibular fracture 07/12/2011  . 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 Anemia 03/06/2013  . Secondary Parkinson disease ([place])   . Bladder spasm   . Hyperchloremia   . Hypertension   . GERD (gastroesophageal reflux disease)   . Stroke [facility])      Assessment: 59 y/o F with altered mental status, WBC WNL, SCr mildly elevated, some hypotension, abnormal U/A, starting broad spectrum anti-biotics.   Goal of Therapy:  Vancomycin trough level 15-20 mcg/ml  Plan:  -Vancomycin 750 mg IV q12h -Ceftazidime 1g IV q8h -Trend WBC, temp, renal function  -Drug levels as  indicated  -F/U urine culture  Narda Bonds 11/06/2014,2:46 AM

## 2014-11-06 NOTE — Progress Notes (Signed)
Utilization review completed. Enmanuel Zufall, RN, BSN. 

## 2014-11-06 NOTE — Progress Notes (Signed)
STROKE TEAM PROGRESS NOTE   HISTORY [name] is an 59 y.o. female with a past medical history significant for HTN, migraine, fibromyalgia, s/p resection left frontal glioma in 1993, epilepsy s/p temporal lobectomy in 2004, small nonhemorrhagic infarct left periatrial region in 2015, post radiation parkinsonism, brought in by EMS due to acute onset right sided weakness. Patient lives at home with her husband at at baseline has limited mobility. As per EMS, husband was helping patient to go to the toilet when he noticed that she was not moving well the right side and was altered. He noticed a strong smell of urine like when she had prior UTI's. EMS noted SBP low 80's, weakness right side, and decreased responsiveness that improved on route the ED.  Initial NIHSS 5. CT brain without acute abnormality. She was last known well 11/05/14 at 2015. tPA was not given due to prior brain surgery x 2. NIHSS: 5  MRS: 4   SUBJECTIVE (INTERVAL HISTORY) Her husband and granddaughter are with her at the bedside.  Her husband has seen seizures in past, he was with her during this time and does not feel it is a seizure. He reported new weakness in both UE, R>L. Overall she feels her condition is stable, though he is worried about her low blood pressure.    OBJECTIVE Temp:  [97.3 F (36.3 C)-98.3 F (36.8 C)] 97.3 F (36.3 C) (10/31 1106) Pulse Rate:  [79-91] 86 (10/31 1106) Cardiac Rhythm:  [-] Normal sinus rhythm (10/31 0800) Resp:  [13-21] 20 (10/31 1106) BP: (61-107)/(31-89) 96/79 mmHg (10/31 1106) SpO2:  [93 %-100 %] 100 % (10/31 1106) Weight:  [85.1 kg (187 lb 9.8 oz)-95.255 kg (210 lb)] 85.1 kg (187 lb 9.8 oz) (10/31 0255)  CBC:   Recent Labs Lab 11/05/14 2145 11/05/14 2151 11/06/14 0311  WBC 7.6  --  4.4  NEUTROABS 4.2  --   --   HGB 11.8* 13.3 10.4*  HCT 36.5 39.0 32.2*  MCV 91.3  --  91.0  PLT 247  --  672    Basic Metabolic Panel:   Recent Labs Lab 11/05/14 2145 11/05/14 2151  11/06/14 0311  NA 135 141 141  K 3.6 3.6 4.2  CL 103 107 111  CO2 18*  --  23  GLUCOSE 108* 107* 89  BUN 17 19 16   CREATININE 1.30* 1.20* 1.23*  CALCIUM 8.9  --  8.5*    Lipid Panel:     Component Value Date/Time   CHOL 107 11/06/2014 0311   TRIG 91 11/06/2014 0311   HDL 33* 11/06/2014 0311   CHOLHDL 3.2 11/06/2014 0311   VLDL 18 11/06/2014 0311   LDLCALC 56 11/06/2014 0311   HgbA1c:  Lab Results  Component Value Date   HGBA1C 5.5 12/28/2013   Urine Drug Screen:     Component Value Date/Time   LABOPIA NONE DETECTED 11/05/2014 2240   COCAINSCRNUR NONE DETECTED 11/05/2014 2240   LABBENZ NONE DETECTED 11/05/2014 2240   AMPHETMU NONE DETECTED 11/05/2014 2240   THCU NONE DETECTED 11/05/2014 2240   LABBARB NONE DETECTED 11/05/2014 2240      IMAGING  Ct Head Wo Contrast 11/05/2014  No acute findings. There are stable treatment related changes and prior left frontal resection. Chronic hemispheric white matter hypodensities likely represent treatment related changes.   MRI HEAD  11/06/2014  1. No acute intracranial infarct or other process identified. 2. Status post left frontal craniotomy for resection of presumed anterior left frontal lobe mass.  Overall, these changes are similar relative to prior study. 3. Stable cerebral atrophy.   MRA HEAD  11/06/2014  1. Stable MRA of the intracranial circulation. No proximal branch occlusion or hemodynamically significant stenosis. 2. Irregularity with attenuation of the distal left MCA branches, likely postoperative in nature. 3. Mild distal small vessel atheromatous irregularity within the distal right PCA.   Dg Chest Port 1 View 11/05/2014   No acute cardiopulmonary process.   Carotid Doppler Findings suggest 1-39% internal carotid artery stenosis bilaterally. Vertebral arteries are patent with antegrade flow.   PHYSICAL EXAM Pleasant obese middle-aged Caucasian lady not in distress. . Afebrile. Head is nontraumatic. Neck is  supple without bruit.    Cardiac exam no murmur or gallop. Lungs are clear to auscultation. Distal pulses are well felt. Neurological Exam :  Awake alert oriented 2 with normal speech and language. Diminished attention, registration and recall. Extraocular movements are pharyngeal or nystagmus. She blinks to threat bilaterally. Fundi were not visualized. Vision acuity and fields are adequate. Face is symmetric without weakness. Tongue is midline. Motor system exam reveals no upper extremity drift. She is able to move both lower extremities against gravity but cannot sustain it ASSESSMENT/PLAN [name] is a 59 y.o. female with history of HTN, migraine, fibromyalgia, s/p resection left frontal glioma in 1993, epilepsy s/p temporal lobectomy in 2004, small nonhemorrhagic infarct left periatrial region in 2015, post radiation parkinsonism presenting with right sided weakness. She did not receive IV t-PA due to prior brain surgery x 2.   R > L sided weakness. recrudescence of previous stroke symptoms. No new Stroke, doubt seizure  MRI  No acute stroke, old L frontal crani  MRA  Stable. No hemodynamically significant stenosis   Carotid Doppler  No significant stenosis   2D Echo  pending   LDL 56  SCDs ordered for VTE prophylaxis Diet regular Room service appropriate?: Yes; Fluid consistency:: Thin  aspirin 81 mg daily prior to admission, now on aspirin 81 mg daily  Ongoing aggressive stroke risk factor management  Therapy recommendations:  pending   Disposition:  pending   Hypotension   Hx Hypertension   syncope from iatrogenic hypotension from ARB/HCTZ. Remains low 93/83  Hyperlipidemia  Home meds:  crestor 10 mg,  resumed in hospital  LDL 56, goal < 70  Continue statin at discharge  Other Stroke Risk Factors  Obesity, Body mass index is 34.31 kg/(m^2).   Hx stroke/TIA  Migraines  Other Active Problems  Frontal lobe brain cancer 1993 and 2005  Epislepsy,    Seizures post crani, on Keppra. No indication to adjust seizure medications  Secondary Parkinson's, radiation induced   Possible UTI. On empiric Vancomycin/ceftazidime  AKI  Firbromyalgia  GERD  Elevated INR  Hospital day # 0  Radene Journey [facility] for Pager information 11/06/2014 1:27 PM  I have personally examined this patient, reviewed notes, independently viewed imaging studies, participated in medical decision making and plan of care. I have made any additions or clarifications directly to the above note. Agree with note above. Patient presented with generalized weakness right greater than left in the setting of hypotension and possible UTI and have worsening of her old deficits in the setting of systemic illness. I doubt she's had a new stroke but she does remain at risk for neurological worsening, strokes, seizures and needs ongoing evaluation. Long discussion of the bedside with the patient's husband and answered questions.  Antony Contras, MD Medical Director  [facility] Pager: [phone] 11/06/2014 2:53 PM    To contact Stroke Continuity provider, please refer to [email or web address] After hours, contact General Neurology

## 2014-11-06 NOTE — Progress Notes (Signed)
*  PRELIMINARY RESULTS* Vascular Ultrasound Carotid Duplex (Doppler) has been completed.   Findings suggest 1-39% internal carotid artery stenosis bilaterally. Vertebral arteries are patent with antegrade flow.  11/06/2014 9:48 AM Maudry Mayhew, RVT, RDCS, RDMS

## 2014-11-07 ENCOUNTER — Inpatient Hospital Stay (HOSPITAL_COMMUNITY): Payer: 59

## 2014-11-07 DIAGNOSIS — I952 Hypotension due to drugs: Secondary | ICD-10-CM | POA: Diagnosis present

## 2014-11-07 DIAGNOSIS — G40909 Epilepsy, unspecified, not intractable, without status epilepticus: Secondary | ICD-10-CM

## 2014-11-07 DIAGNOSIS — I10 Essential (primary) hypertension: Secondary | ICD-10-CM | POA: Diagnosis present

## 2014-11-07 DIAGNOSIS — G40409 Other generalized epilepsy and epileptic syndromes, not intractable, without status epilepticus: Secondary | ICD-10-CM | POA: Diagnosis present

## 2014-11-07 DIAGNOSIS — R2990 Unspecified symptoms and signs involving the nervous system: Secondary | ICD-10-CM

## 2014-11-07 DIAGNOSIS — N390 Urinary tract infection, site not specified: Secondary | ICD-10-CM | POA: Diagnosis present

## 2014-11-07 DIAGNOSIS — N179 Acute kidney failure, unspecified: Secondary | ICD-10-CM | POA: Diagnosis present

## 2014-11-07 DIAGNOSIS — R410 Disorientation, unspecified: Secondary | ICD-10-CM | POA: Diagnosis present

## 2014-11-07 DIAGNOSIS — G40309 Generalized idiopathic epilepsy and epileptic syndromes, not intractable, without status epilepticus: Secondary | ICD-10-CM | POA: Diagnosis present

## 2014-11-07 LAB — HEMOGLOBIN A1C
Hgb A1c MFr Bld: 5.5 % (ref 4.8–5.6)
Mean Plasma Glucose: 111 mg/dL

## 2014-11-07 LAB — URINE CULTURE

## 2014-11-07 NOTE — Progress Notes (Signed)
EEG completed, results pending. 

## 2014-11-07 NOTE — Progress Notes (Signed)
STROKE TEAM PROGRESS NOTE   HISTORY [name] is an 59 y.o. female with a past medical history significant for HTN, migraine, fibromyalgia, s/p resection left frontal glioma in 1993, epilepsy s/p temporal lobectomy in 2004, small nonhemorrhagic infarct left periatrial region in 2015, post radiation parkinsonism, brought in by EMS due to acute onset right sided weakness. Patient lives at home with her husband at at baseline has limited mobility. As per EMS, husband was helping patient to go to the toilet when he noticed that she was not moving well the right side and was altered. He noticed a strong smell of urine like when she had prior UTI's. EMS noted SBP low 80's, weakness right side, and decreased responsiveness that improved on route the ED.  Initial NIHSS 5. CT brain without acute abnormality. She was last known well 11/05/14 at 2015. tPA was not given due to prior brain surgery x 2. NIHSS: 5  MRS: 4   SUBJECTIVE (INTERVAL HISTORY) Her daughter in law and granddaughter are with her at the bedside.  Overall she feels her condition is stable, though she is worried about some hallucinations she has been having Echo is pending    OBJECTIVE Temp:  [97.6 F (36.4 C)-98 F (36.7 C)] 97.6 F (36.4 C) (11/01 1100) Pulse Rate:  [66-89] 85 (11/01 1300) Cardiac Rhythm:  [-] Normal sinus rhythm (11/01 1200) Resp:  [9-24] 17 (11/01 1300) BP: (86-123)/(43-105) 107/83 mmHg (11/01 1300) SpO2:  [94 %-100 %] 100 % (11/01 1300)  CBC:   Recent Labs Lab 11/05/14 2145 11/05/14 2151 11/06/14 0311  WBC 7.6  --  4.4  NEUTROABS 4.2  --   --   HGB 11.8* 13.3 10.4*  HCT 36.5 39.0 32.2*  MCV 91.3  --  91.0  PLT 247  --  449    Basic Metabolic Panel:   Recent Labs Lab 11/05/14 2145 11/05/14 2151 11/06/14 0311  NA 135 141 141  K 3.6 3.6 4.2  CL 103 107 111  CO2 18*  --  23  GLUCOSE 108* 107* 89  BUN 17 19 16   CREATININE 1.30* 1.20* 1.23*  CALCIUM 8.9  --  8.5*    Lipid Panel:      Component Value Date/Time   CHOL 107 11/06/2014 0311   TRIG 91 11/06/2014 0311   HDL 33* 11/06/2014 0311   CHOLHDL 3.2 11/06/2014 0311   VLDL 18 11/06/2014 0311   LDLCALC 56 11/06/2014 0311   HgbA1c:  Lab Results  Component Value Date   HGBA1C 5.5 11/05/2014   Urine Drug Screen:     Component Value Date/Time   LABOPIA NONE DETECTED 11/05/2014 2240   COCAINSCRNUR NONE DETECTED 11/05/2014 2240   LABBENZ NONE DETECTED 11/05/2014 2240   AMPHETMU NONE DETECTED 11/05/2014 2240   THCU NONE DETECTED 11/05/2014 2240   LABBARB NONE DETECTED 11/05/2014 2240      IMAGING  Ct Head Wo Contrast 11/05/2014  No acute findings. There are stable treatment related changes and prior left frontal resection. Chronic hemispheric white matter hypodensities likely represent treatment related changes.   MRI HEAD  11/06/2014  1. No acute intracranial infarct or other process identified. 2. Status post left frontal craniotomy for resection of presumed anterior left frontal lobe mass. Overall, these changes are similar relative to prior study. 3. Stable cerebral atrophy.   MRA HEAD  11/06/2014  1. Stable MRA of the intracranial circulation. No proximal branch occlusion or hemodynamically significant stenosis. 2. Irregularity with attenuation of the distal  left MCA branches, likely postoperative in nature. 3. Mild distal small vessel atheromatous irregularity within the distal right PCA.   Dg Chest Port 1 View 11/05/2014   No acute cardiopulmonary process.   Carotid Doppler Findings suggest 1-39% internal carotid artery stenosis bilaterally. Vertebral arteries are patent with antegrade flow.   PHYSICAL EXAM Pleasant obese middle-aged Caucasian lady not in distress. . Afebrile. Head is nontraumatic. Neck is supple without bruit.    Cardiac exam no murmur or gallop. Lungs are clear to auscultation. Distal pulses are well felt. Neurological Exam :  Awake alert oriented 2 with normal speech and  language. Diminished attention, registration and recall. Extraocular movements are pharyngeal or nystagmus. She blinks to threat bilaterally. Fundi were not visualized. Vision acuity and fields are adequate. Face is symmetric without weakness. Tongue is midline. Motor system exam reveals no upper extremity drift. She is able to move both lower extremities against gravity but cannot sustain it ASSESSMENT/PLAN [name] is a 59 y.o. female with history of HTN, migraine, fibromyalgia, s/p resection left frontal glioma in 1993, epilepsy s/p temporal lobectomy in 2004, small nonhemorrhagic infarct left periatrial region in 2015, post radiation parkinsonism presenting with right sided weakness. She did not receive IV t-PA due to prior brain surgery x 2.   R > L sided weakness. recrudescence of previous stroke symptoms. No new Stroke, doubt seizure  MRI  No acute stroke, old L frontal crani  MRA  Stable. No hemodynamically significant stenosis   Carotid Doppler  No significant stenosis   2D Echo  pending   LDL 56  SCDs ordered for VTE prophylaxis Diet regular Room service appropriate?: Yes; Fluid consistency:: Thin  aspirin 81 mg daily prior to admission, now on aspirin 81 mg daily  Ongoing aggressive stroke risk factor management  Therapy recommendations:  pending   Disposition:  pending   Hypotension   Hx Hypertension   syncope from iatrogenic hypotension from ARB/HCTZ. Remains low 93/83  Hyperlipidemia  Home meds:  crestor 10 mg,  resumed in hospital  LDL 56, goal < 70  Continue statin at discharge  Other Stroke Risk Factors  Obesity, Body mass index is 34.31 kg/(m^2).   Hx stroke/TIA  Migraines  Other Active Problems  Frontal lobe brain cancer 1993 and 2005  Epislepsy,   Seizures post crani, on Keppra. No indication to adjust seizure medications  Secondary Parkinson's, radiation induced   Possible UTI. On empiric  Vancomycin/ceftazidime  AKI  Firbromyalgia  GERD  Elevated INR  Hospital day # [place] for Pager information 11/07/2014 2:27 PM  I have personally examined this patient, reviewed notes, independently viewed imaging studies, participated in medical decision making and plan of care. I have made any additions or clarifications directly to the above note. Agree with note above. Patient presented with generalized weakness right greater than left in the setting of hypotension and possible UTI and have worsening of her old deficits in the setting of systemic illness. I doubt she's had a new stroke but  . Long discussion  at the bedside with the patient's daughter inlaw and grandaughter and answered questions.  Antony Contras, MD Medical Director [facility] Pager: [phone] 11/07/2014 2:27 PM    To contact Stroke Continuity provider, please refer to [email or web address] After hours, contact General Neurology

## 2014-11-07 NOTE — Progress Notes (Signed)
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  The patient was in her usual state of health when her husband was helping her onto the commode. He noticed that she closed her eyes and rolled her head back and became unresponsive, not answering questions, and sluggish with movements. EMS noted SBP in the 80s, sluggishness, and ?right-sided weakness so code stroke was called on the way in.  In the ED, stroke team doubted stroke as the cause of her globally decreased sensorium. A noncontrasted CT head was unchanged from her previous. She was afebrile and had normal WBC. Her blood pressure improved somewhat with fluid bolus and a lactic acid level was normal. The urine was turbid with copious WBC and bacteria and so the patient was started on vancomycin, aztreonam, and Levaquin, and TRH was asked to admit for presumed sepsis from UTI.  Of note, the patient was complaining of dysuria 1 week ago and did take a burst of Bactrim on top of the daily nitrofurantoin last week with resolution of symptoms. Outpatient notes appear to show that her systolic blood pressure was in the 90s as far back as July. Her husband notes that the last few times at the doctors' office staff have had trouble getting her blood pressure with automatic cuffs. Nonetheless the patient continues to take olmesartan/HCTZ daily.   HPI/Subjective: 11/1  A/O 3 (does not recall why), but does remember riding in an ambulance. Negative N/V, negative abdominal pain, negative CVA tenderness. Patient states does not  ambulate at home secondary to lower extremity weakness, but does get around in a wheelchair.  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 -presents with a syncopal episode on the commode - favor syncope from iatrogenic hypotension from ARB/HCTZ - does not formally meet SIRS criteria -Resolved appears to be back at baseline   Hypotension  -Patient still appears to be borderline hypotensive (may be contributing to her syncopal episode)  -Obtain orthostatic vitals in A.m.   -Continue normal saline at 159ml/hr -Patient with multiple brain surgery Obtain A.m. cortisol  Possible UTI -very infected appearing urine by microscopy -Continue empiric antibiotics -Urine culture pending  AKI -The patient has a small increase in creatinine from previous baseline, presumably from ARB use and hypotension causing mild ischemia -Continue to hold ARB  HTN The patient has a history of essential hypertension, but has been hypotensive at recent office visits - discontinue ARB and HCTZ  Epilepsy  -Continue Keppra 1500 mg BID   Fibromyalgia Stable  GERD  Stable  Elevated INR -Resolved    Code Status: FULL Family Communication: no family present at time of exam Disposition Plan: DC in 24-48 hours    Consultants: Dr.Charles Nicole Kindred (neurology)   Procedure/Significant Events: 11/1 EGD;abnormal with moderately severe doing recorded from the left frontal and temporal regions consistent with a frontal tumor resection and left temporal lobectomy -Negative epileptiform discharges    Culture 10/30 blood Left/Right AC NGTD 10/30 urine NGTD 10/31 MRSA by PCR negative   Antibiotics: Levaquin 10/30 > stop 10/31 Aztreonam 10/30 > stop  10/31 Ceftaz 10/30 > vancomycin 10/30 >    DVT prophylaxis: SCD   Devices    LINES / TUBES:      Continuous Infusions: . sodium chloride 125  mL/hr at 11/07/14 1300    Objective: VITAL SIGNS: Temp: 97.8 F (36.6 C) (11/01 1600) Temp Source: Oral (11/01 1600) BP: 101/58 mmHg (11/01 1700) Pulse Rate: 87 (11/01 1700) SPO2; FIO2:   Intake/Output Summary (Last 24 hours) at 11/07/14 1839 Last data filed at 11/07/14 1700  Gross per 24 hour  Intake   3825 ml  Output      0 ml  Net   3825 ml     Exam: General: A/O 3 (does not recall why), but does remember riding in an ambulance, No acute respiratory distress Eyes: Negative headache, eye pain, double vision,negative scleral hemorrhage ENT: Negative Runny nose, negative ear pain, negative gingival bleeding, Neck:  Negative scars, masses, torticollis, lymphadenopathy, JVD Lungs: Clear to auscultation bilaterally without wheezes or crackles Cardiovascular: Regular rate and rhythm without murmur gallop or rub normal S1 and S2 Abdomen: Positive mild suprapubic abdominal pain, nondistended, positive soft, bowel sounds, no rebound, no ascites, no appreciable mass, plus left CVA tenderness Extremities: No significant cyanosis, clubbing, or edema bilateral lower extremities Psychiatric:  Negative depression, negative anxiety, negative fatigue, negative mania  Neurologic:  Cranial nerves II through XII intact, tongue/uvula midline, all extremities muscle strength 4/5, sensation intact throughout,  negative dysarthria, negative expressive aphasia, negative receptive aphasia.   Data Reviewed: Basic Metabolic Panel:  Recent Labs Lab 11/05/14 2145 11/05/14 2151 11/06/14 0311  NA 135 141 141  K 3.6 3.6 4.2  CL 103 107 111  CO2 18*  --  23  GLUCOSE 108* 107* 89  BUN 17 19 16   CREATININE 1.30* 1.20* 1.23*  CALCIUM 8.9  --  8.5*   Liver Function Tests:  Recent Labs Lab 11/05/14 2145 11/06/14 0311  AST 30 28  ALT 13* 14  ALKPHOS 33* 32*  BILITOT 0.8 0.5  PROT 6.8 5.9*  ALBUMIN 3.8 3.3*   No results for input(s): LIPASE, AMYLASE in the last 168 hours.  Recent  Labs Lab 11/05/14 2320  AMMONIA 55*   CBC:  Recent Labs Lab 11/05/14 2145 11/05/14 2151 11/06/14 0311  WBC 7.6  --  4.4  NEUTROABS 4.2  --   --   HGB 11.8* 13.3 10.4*  HCT 36.5 39.0 32.2*  MCV 91.3  --  91.0  PLT 247  --  211   Cardiac Enzymes:  Recent Labs Lab 11/05/14 2145  TROPONINI <0.03   BNP (last 3 results) No results for input(s): BNP in the last 8760 hours.  ProBNP (last 3 results) No results for input(s): PROBNP in the last 8760 hours.  CBG:  Recent Labs Lab 11/05/14 2220  GLUCAP 99    Recent Results (from the past 240 hour(s))  Blood Culture (routine x 2)     Status: None (Preliminary result)   Collection Time: 11/05/14 10:30 PM  Result Value Ref Range Status   Specimen Description BLOOD LEFT ANTECUBITAL  Final   Special Requests BOTTLES DRAWN AEROBIC AND ANAEROBIC 5CC   Final   Culture NO GROWTH 2 DAYS  Final   Report Status PENDING  Incomplete  Urine culture     Status: None   Collection Time: 11/05/14 10:40 PM  Result Value Ref Range Status   Specimen Description URINE, CATHETERIZED  Final   Special Requests NONE  Final   Culture MULTIPLE SPECIES PRESENT,  SUGGEST RECOLLECTION  Final   Report Status 11/07/2014 FINAL  Final  Blood Culture (routine x 2)     Status: None (Preliminary result)   Collection Time: 11/05/14 11:11 PM  Result Value Ref Range Status   Specimen Description BLOOD RIGHT ANTECUBITAL  Final   Special Requests BOTTLES DRAWN AEROBIC AND ANAEROBIC 5CC  Final   Culture NO GROWTH 2 DAYS  Final   Report Status PENDING  Incomplete  MRSA PCR Screening     Status: None   Collection Time: 11/06/14  3:19 AM  Result Value Ref Range Status   MRSA by PCR NEGATIVE NEGATIVE Final    Comment:        The GeneXpert MRSA Assay (FDA approved for NASAL specimens only), is one component of a comprehensive MRSA colonization surveillance program. It is not intended to diagnose MRSA infection nor to guide or monitor treatment for MRSA  infections.      Studies:  Recent x-ray studies have been reviewed in detail by the Attending Physician  Scheduled Meds:  Scheduled Meds: . aspirin EC  81 mg Oral Daily  . cefTAZidime (FORTAZ)  IV  1 g Intravenous 3 times per day  . DULoxetine  60 mg Oral Daily  . fenofibrate  160 mg Oral Daily  . fesoterodine  8 mg Oral Daily  . levETIRAcetam  1,500 mg Oral BID  . pantoprazole  40 mg Oral Daily  . pregabalin  150 mg Oral BID  . rosuvastatin  10 mg Oral Q M,W,F  . sodium chloride  3 mL Intravenous Q12H  . vancomycin  750 mg Intravenous Q12H    Time spent on care of this patient: 40 mins   WOODS, Geraldo Docker , MD  [facility] Office  [phone] Pager - [phone]  On-Call/Text Page:      [email or web address]      password [email or web address]  If 7PM-7AM, please contact night-coverage [email or web address] Password [email or web address] 11/07/2014, 6:39 PM   LOS: 1 day   Care during the described time interval was provided by me .  I have reviewed this patient's available data, including medical history, events of note, physical examination, and all test results as part of my evaluation. I have personally reviewed and interpreted all radiology studies.   Dia Crawford, MD [phone] Pager

## 2014-11-07 NOTE — Procedures (Signed)
ELECTROENCEPHALOGRAM REPORT  Patient: [name]       Room #: 4R74 EEG No. ID: [id number] Age: 59 y.o.        Sex: female Referring Physician: Sherral Hammers, C Report Date:  11/07/2014        Interpreting Physician: Anthony Sar  History: [name] is an 59 y.o. female status post left frontal glioma, seizure disorder, status post left temporal lobectomy for seizure control 2004 and history of nonhemorrhagic stroke, presenting with worsening hemiparesis with MRI showing no signs of acute stroke. Patient also has an acute urinary tract infection with possible recrudescence of previous deficits that have improved.  Indications for study:  Rule out focal seizure activity.  Technique: This is an 18 channel routine scalp EEG performed at the bedside with bipolar and monopolar montages arranged in accordance to the international 10/20 system of electrode placement.   Description: This EEG recording was performed during wakefulness. There was diffuse slowing of cerebral activity which was moderately severe and no higher amplitude involving the left frontal and temporal regions, with only mild slowing recorded from of the brain regions. Photic stimulation was not performed. No epileptiform discharges were recorded.  Interpretation: This EEG is abnormal with moderately severe doing recorded from the left frontal and temporal regions consistent with a frontal tumor resection and left temporal lobectomy. There was mild slowing of brain activity from of the brain regions. No epileptiform discharges were recorded.  Rush Farmer M.D. [facility] [phone]

## 2014-11-08 DIAGNOSIS — R410 Disorientation, unspecified: Secondary | ICD-10-CM

## 2014-11-08 DIAGNOSIS — I950 Idiopathic hypotension: Secondary | ICD-10-CM

## 2014-11-08 LAB — COMPREHENSIVE METABOLIC PANEL
ALT: 13 U/L — ABNORMAL LOW (ref 14–54)
ANION GAP: 8 (ref 5–15)
AST: 24 U/L (ref 15–41)
Albumin: 2.6 g/dL — ABNORMAL LOW (ref 3.5–5.0)
Alkaline Phosphatase: 32 U/L — ABNORMAL LOW (ref 38–126)
BILIRUBIN TOTAL: 0.5 mg/dL (ref 0.3–1.2)
BUN: 11 mg/dL (ref 6–20)
CALCIUM: 8.5 mg/dL — AB (ref 8.9–10.3)
CO2: 24 mmol/L (ref 22–32)
Chloride: 109 mmol/L (ref 101–111)
Creatinine, Ser: 0.91 mg/dL (ref 0.44–1.00)
Glucose, Bld: 97 mg/dL (ref 65–99)
POTASSIUM: 3.7 mmol/L (ref 3.5–5.1)
Sodium: 141 mmol/L (ref 135–145)
TOTAL PROTEIN: 4.9 g/dL — AB (ref 6.5–8.1)

## 2014-11-08 LAB — CBC WITH DIFFERENTIAL/PLATELET
BASOS PCT: 1 %
Basophils Absolute: 0 10*3/uL (ref 0.0–0.1)
Eosinophils Absolute: 0.2 10*3/uL (ref 0.0–0.7)
Eosinophils Relative: 5 %
HEMATOCRIT: 27.6 % — AB (ref 36.0–46.0)
Hemoglobin: 9.1 g/dL — ABNORMAL LOW (ref 12.0–15.0)
LYMPHS ABS: 1.4 10*3/uL (ref 0.7–4.0)
LYMPHS PCT: 37 %
MCH: 29.4 pg (ref 26.0–34.0)
MCHC: 33 g/dL (ref 30.0–36.0)
MCV: 89 fL (ref 78.0–100.0)
MONO ABS: 0.4 10*3/uL (ref 0.1–1.0)
MONOS PCT: 11 %
NEUTROS ABS: 1.7 10*3/uL (ref 1.7–7.7)
Neutrophils Relative %: 46 %
Platelets: 180 10*3/uL (ref 150–400)
RBC: 3.1 MIL/uL — ABNORMAL LOW (ref 3.87–5.11)
RDW: 14.4 % (ref 11.5–15.5)
WBC: 3.7 10*3/uL — ABNORMAL LOW (ref 4.0–10.5)

## 2014-11-08 LAB — MAGNESIUM: MAGNESIUM: 1.4 mg/dL — AB (ref 1.7–2.4)

## 2014-11-08 LAB — CORTISOL-AM, BLOOD: Cortisol - AM: 4.9 ug/dL — ABNORMAL LOW (ref 6.7–22.6)

## 2014-11-08 MED ORDER — PROMETHAZINE HCL 25 MG PO TABS
12.5000 mg | ORAL_TABLET | Freq: Four times a day (QID) | ORAL | Status: DC | PRN
  Administered 2014-11-10: 12.5 mg via ORAL
  Filled 2014-11-08: qty 1

## 2014-11-08 MED ORDER — COSYNTROPIN 0.25 MG IJ SOLR
0.2500 mg | Freq: Once | INTRAMUSCULAR | Status: AC
  Administered 2014-11-09: 0.25 mg via INTRAVENOUS
  Filled 2014-11-08: qty 0.25

## 2014-11-08 MED ORDER — MAGNESIUM SULFATE 2 GM/50ML IV SOLN
2.0000 g | Freq: Once | INTRAVENOUS | Status: AC
  Administered 2014-11-08: 2 g via INTRAVENOUS
  Filled 2014-11-08: qty 50

## 2014-11-08 MED ORDER — ACETAMINOPHEN 325 MG PO TABS
650.0000 mg | ORAL_TABLET | ORAL | Status: DC | PRN
  Administered 2014-11-08 – 2014-11-11 (×6): 650 mg via ORAL
  Filled 2014-11-08 (×6): qty 2

## 2014-11-08 NOTE — Progress Notes (Signed)
Pt arrived on unit 2040hrs, A&O though forgetful, no obvious distress, oriented to room and equipment

## 2014-11-08 NOTE — Progress Notes (Signed)
[place] TEAM 1 - Stepdown/ICU TEAM PROGRESS NOTE  [name] OBS:[id number] DOB: [birth year] DOA: 11/05/2014 PCP: Annye Asa, MD  Admit HPI / Brief Narrative: 59 y.o. 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  The patient was in her usual state of health when her husband was helping her onto the commode. He noticed that she closed her eyes and rolled her head back and became unresponsive, not answering questions, and sluggish with movements. EMS noted SBP in the 80s, sluggishness, and ?right-sided weakness so code stroke was called on the way in.  In the ED, stroke team doubted stroke as the cause of her globally decreased sensorium. A noncontrasted CT head was unchanged from her previous. She was afebrile and had normal WBC. Her blood pressure improved somewhat with fluid bolus and a lactic acid level was normal. The urine was turbid with copious WBC and bacteria and so the patient was started on vancomycin, aztreonam, and Levaquin, and TRH was asked to admit for presumed sepsis from UTI.  Of note, the patient was complaining of dysuria 1 week ago and did take a burst of Bactrim on top of the daily nitrofurantoin last week with resolution of symptoms. Outpatient notes appear to show that her systolic blood pressure was in the 90s as far back as July. Her husband notes that the last few times at the doctors' office staff have had trouble getting her blood pressure with automatic cuffs. Nonetheless the patient continues to take olmesartan/HCTZ daily.  HPI/Subjective: Pt is resting comfortably in bed.  She has no new complaints. She has not yet been up and out of bed. At baseline she is not able to  walk, but does stand to pivot w/ assist.  She denies cp, n/v, abdom pain, or sob.    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 (though TSH is notably normal)  Possible UTI very infected appearing urine by microscopy - urine culture not helpful - pt w/o fever or elevated WBC - will complete 5 days of abx then d/c and follow clinically   AKI The patient has a small increase in creatinine from previous baseline, presumably from ARB use and hypotension causing mild ischemia - this has resolved w/ volume expansion   HTN The patient has a reported history of essential hypertension, but has been hypotensive at recent office visits - discontinue ARB and HCTZ - working up for adrenal insuff   Epilepsy  Stable - continue Keppra 1500 mg BID   Fibromyalgia Stable  GERD  Stable  Elevated INR Resolved   Hypomagnesemia  Replace and recheck in AM  Code Status: FULL Family Communication: no family present at time of exam Disposition Plan: adrenal eval - OT/PT evals - possible d/c home 24-48hrs   Consultants: Neurology   Procedures: Carotid doppler - 10/31 -  no signif B stenosis  TTE - 10/31 - EF 60-65% - no WMA - grade 1 DD EEG - 11/1 - no epileptiform discharges   Antibiotics: Ceftaz 10/30 > Levaquin 10/30 Vancomycin 10/30 > 11/2 Aztreonam 10/30   DVT prophylaxis: SCDs  Objective: Blood pressure 113/56, pulse 88, temperature 98.3 F (36.8 C), temperature source Oral, resp. rate 20, height 5\' 2"  (1.575 m), weight 92.6 kg (204 lb  2.3 oz), SpO2 97 %.  Intake/Output Summary (Last 24 hours) at 11/08/14 1712 Last data filed at 11/07/14 2242  Gross per 24 hour  Intake      3 ml  Output      0 ml  Net      3 ml   Exam: General: No acute respiratory distress - alert  Lungs: Clear to auscultation bilaterally without wheezes or crackles Cardiovascular: Regular rate and rhythm without murmur gallop or rub normal S1 and S2 Abdomen: Nontender, nondistended, soft, bowel sounds positive, no rebound, no ascites, no appreciable mass Extremities: No significant cyanosis, clubbing, or edema bilateral lower extremities   Data Reviewed: Basic Metabolic Panel:  Recent Labs Lab 11/05/14 2145 11/05/14 2151 11/06/14 0311 11/08/14 0501  NA 135 141 141 141  K 3.6 3.6 4.2 3.7  CL 103 107 111 109  CO2 18*  --  23 24  GLUCOSE 108* 107* 89 97  BUN 17 19 16 11   CREATININE 1.30* 1.20* 1.23* 0.91  CALCIUM 8.9  --  8.5* 8.5*  MG  --   --   --  1.4*    CBC:  Recent Labs Lab 11/05/14 2145 11/05/14 2151 11/06/14 0311 11/08/14 0501  WBC 7.6  --  4.4 3.7*  NEUTROABS 4.2  --   --  1.7  HGB 11.8* 13.3 10.4* 9.1*  HCT 36.5 39.0 32.2* 27.6*  MCV 91.3  --  91.0 89.0  PLT 247  --  211 180    Liver Function Tests:  Recent Labs Lab 11/05/14 2145 11/06/14 0311 11/08/14 0501  AST 30 28 24   ALT 13* 14 13*  ALKPHOS 33* 32* 32*  BILITOT 0.8 0.5 0.5  PROT 6.8 5.9* 4.9*  ALBUMIN 3.8 3.3* 2.6*    Recent Labs Lab 11/05/14 2320  AMMONIA 55*    Coags:  Recent Labs Lab 11/05/14 2230 11/06/14 0311  INR 1.61* 1.35    Cardiac Enzymes:  Recent Labs Lab 11/05/14 2145  TROPONINI <0.03    CBG:  Recent Labs Lab 11/05/14 2220  GLUCAP 99    Recent Results (from the past 240 hour(s))  Blood Culture (routine x 2)     Status: None (Preliminary result)   Collection Time: 11/05/14 10:30 PM  Result Value Ref Range Status   Specimen Description BLOOD LEFT ANTECUBITAL  Final   Special Requests BOTTLES DRAWN AEROBIC  AND ANAEROBIC 5CC   Final   Culture NO GROWTH 3 DAYS  Final   Report Status PENDING  Incomplete  Urine culture     Status: None   Collection Time: 11/05/14 10:40 PM  Result Value Ref Range Status   Specimen Description URINE, CATHETERIZED  Final   Special Requests NONE  Final   Culture MULTIPLE SPECIES PRESENT, SUGGEST RECOLLECTION  Final   Report Status 11/07/2014 FINAL  Final  Blood Culture (routine x 2)     Status: None (Preliminary result)   Collection Time: 11/05/14 11:11 PM  Result Value Ref Range Status   Specimen Description BLOOD RIGHT ANTECUBITAL  Final   Special Requests BOTTLES  DRAWN AEROBIC AND ANAEROBIC 5CC  Final   Culture NO GROWTH 3 DAYS  Final   Report Status PENDING  Incomplete  MRSA PCR Screening     Status: None   Collection Time: 11/06/14  3:19 AM  Result Value Ref Range Status   MRSA by PCR NEGATIVE NEGATIVE Final    Comment:        The GeneXpert MRSA Assay (FDA approved for NASAL specimens only), is one component of a comprehensive MRSA colonization surveillance program. It is not intended to diagnose MRSA infection nor to guide or monitor treatment for MRSA infections.      Studies:   Recent x-ray studies have been reviewed in detail by the Attending Physician  Scheduled Meds:  Scheduled Meds: . aspirin EC  81 mg Oral Daily  . cefTAZidime (FORTAZ)  IV  1 g Intravenous 3 times per day  . DULoxetine  60 mg Oral Daily  . fenofibrate  160 mg Oral Daily  . fesoterodine  8 mg Oral Daily  . levETIRAcetam  1,500 mg Oral BID  . pantoprazole  40 mg Oral Daily  . pregabalin  150 mg Oral BID  . rosuvastatin  10 mg Oral Q M,W,F  . sodium chloride  3 mL Intravenous Q12H  . vancomycin  750 mg Intravenous Q12H    Time spent on care of this patient: 35 mins   MCCLUNG,JEFFREY T , MD   [facility] Office  [phone] Pager - Text Page per Shea Evans as per below:  On-Call/Text Page:      [email or web address]      password [email or web address]  If 7PM-7AM, please  contact night-coverage [email or web address] Password [email or web address] 11/08/2014, 5:12 PM   LOS: 2 days

## 2014-11-08 NOTE — Care Management Note (Signed)
Case Management Note  Patient Details  Name: [name] MRN: [id number] Date of Birth: 1955-07-06  Subjective/Objective:  59 y.o. F admitted with Hypotension. pmh HTN. S/p resection L Frontal glioma 1993. S/p lobectomy 2004.               Action/Plan:will continue to follow for Disposition and/or Discharge needs   Expected Discharge Date:  11/10/14               Expected Discharge Plan:     In-House Referral:     Discharge planning Services  CM Consult  Post Acute Care Choice:    Choice offered to:     DME Arranged:    DME Agency:     HH Arranged:    HH Agency:     Status of Service:  In process, will continue to follow  Medicare Important Message Given:    Date Medicare IM Given:    Medicare IM give by:    Date Additional Medicare IM Given:    Additional Medicare Important Message give by:     If discussed at [place] of Stay Meetings, dates discussed:    Additional Comments:  Delrae Sawyers, RN 11/08/2014, 4:08 PM

## 2014-11-08 NOTE — Progress Notes (Signed)
STROKE TEAM PROGRESS NOTE   HISTORY [name] is an 59 y.o. female with a past medical history significant for HTN, migraine, fibromyalgia, s/p resection left frontal glioma in 1993, epilepsy s/p temporal lobectomy in 2004, small nonhemorrhagic infarct left periatrial region in 2015, post radiation parkinsonism, brought in by EMS due to acute onset right sided weakness. Patient lives at home with her husband at at baseline has limited mobility. As per EMS, husband was helping patient to go to the toilet when he noticed that she was not moving well the right side and was altered. He noticed a strong smell of urine like when she had prior UTI's. EMS noted SBP low 80's, weakness right side, and decreased responsiveness that improved on route the ED.  Initial NIHSS 5. CT brain without acute abnormality. She was last known well 11/05/14 at 2015. tPA was not given due to prior brain surgery x 2. NIHSS: 5  MRS: 4   SUBJECTIVE (INTERVAL HISTORY) Her husband is with her at the bedside.  Overall she feels her condition is stable, though she is worried about some hallucinations she has been having Echo is unremarkable   OBJECTIVE Temp:  [98.1 F (36.7 C)-99.7 F (37.6 C)] 98.3 F (36.8 C) (11/02 1516) Pulse Rate:  [75-89] 88 (11/02 1516) Cardiac Rhythm:  [-] Normal sinus rhythm (11/02 0705) Resp:  [20-22] 20 (11/02 1516) BP: (101-123)/(40-63) 113/56 mmHg (11/02 1516) SpO2:  [96 %-99 %] 97 % (11/02 1516) Weight:  [204 lb 2.3 oz (92.6 kg)] 204 lb 2.3 oz (92.6 kg) (11/01 2044)  CBC:   Recent Labs Lab 11/05/14 2145  11/06/14 0311 11/08/14 0501  WBC 7.6  --  4.4 3.7*  NEUTROABS 4.2  --   --  1.7  HGB 11.8*  < > 10.4* 9.1*  HCT 36.5  < > 32.2* 27.6*  MCV 91.3  --  91.0 89.0  PLT 247  --  211 180  < > = values in this interval not displayed.  Basic Metabolic Panel:   Recent Labs Lab 11/06/14 0311 11/08/14 0501  NA 141 141  K 4.2 3.7  CL 111 109  CO2 23 24  GLUCOSE 89 97  BUN 16 11   CREATININE 1.23* 0.91  CALCIUM 8.5* 8.5*  MG  --  1.4*    Lipid Panel:     Component Value Date/Time   CHOL 107 11/06/2014 0311   TRIG 91 11/06/2014 0311   HDL 33* 11/06/2014 0311   CHOLHDL 3.2 11/06/2014 0311   VLDL 18 11/06/2014 0311   LDLCALC 56 11/06/2014 0311   HgbA1c:  Lab Results  Component Value Date   HGBA1C 5.5 11/05/2014   Urine Drug Screen:     Component Value Date/Time   LABOPIA NONE DETECTED 11/05/2014 2240   COCAINSCRNUR NONE DETECTED 11/05/2014 2240   LABBENZ NONE DETECTED 11/05/2014 2240   AMPHETMU NONE DETECTED 11/05/2014 2240   THCU NONE DETECTED 11/05/2014 2240   LABBARB NONE DETECTED 11/05/2014 2240      IMAGING  Ct Head Wo Contrast 11/05/2014  No acute findings. There are stable treatment related changes and prior left frontal resection. Chronic hemispheric white matter hypodensities likely represent treatment related changes.   MRI HEAD  11/06/2014  1. No acute intracranial infarct or other process identified. 2. Status post left frontal craniotomy for resection of presumed anterior left frontal lobe mass. Overall, these changes are similar relative to prior study. 3. Stable cerebral atrophy.   MRA HEAD  11/06/2014  1. Stable MRA of the intracranial circulation. No proximal branch occlusion or hemodynamically significant stenosis. 2. Irregularity with attenuation of the distal left MCA branches, likely postoperative in nature. 3. Mild distal small vessel atheromatous irregularity within the distal right PCA.   Dg Chest Port 1 View 11/05/2014   No acute cardiopulmonary process.   Carotid Doppler Findings suggest 1-39% internal carotid artery stenosis bilaterally. Vertebral arteries are patent with antegrade flow.   PHYSICAL EXAM Pleasant obese middle-aged Caucasian lady not in distress. . Afebrile. Head is nontraumatic. Neck is supple without bruit.    Cardiac exam no murmur or gallop. Lungs are clear to auscultation. Distal pulses are well  felt. Neurological Exam :  Awake alert oriented 2 with normal speech and language. Diminished attention, registration and recall. Extraocular movements are pharyngeal or nystagmus. She blinks to threat bilaterally. Fundi were not visualized. Vision acuity and fields are adequate. Face is symmetric without weakness. Tongue is midline. Motor system exam reveals no upper extremity drift. She is able to move both lower extremities against gravity but cannot sustain it ASSESSMENT/PLAN [name] is a 59 y.o. female with history of HTN, migraine, fibromyalgia, s/p resection left frontal glioma in 1993, epilepsy s/p temporal lobectomy in 2004, small nonhemorrhagic infarct left periatrial region in 2015, post radiation parkinsonism presenting with right sided weakness. She did not receive IV t-PA due to prior brain surgery x 2.   R > L sided weakness. recrudescence of previous stroke symptoms. No new Stroke, doubt seizure  MRI  No acute stroke, old L frontal crani  MRA  Stable. No hemodynamically significant stenosis   Carotid Doppler  No significant stenosis   2D Echo  Left ventricle: The cavity size was normal. Wall thickness was normal. Systolic function was normal. The estimated ejection fraction was in the range of 60% to 65%. Wall motion was normal; there were no regional wall motion abnormalities  LDL 56  SCDs ordered for VTE prophylaxis Diet regular Room service appropriate?: Yes; Fluid consistency:: Thin  aspirin 81 mg daily prior to admission, now on aspirin 81 mg daily  Ongoing aggressive stroke risk factor management  Therapy recommendations:  pending   Disposition:  pending   Hypotension   Hx Hypertension   syncope from iatrogenic hypotension from ARB/HCTZ. Remains low 93/83  Hyperlipidemia  Home meds:  crestor 10 mg,  resumed in hospital  LDL 56, goal < 70  Continue statin at discharge  Other Stroke Risk Factors  Obesity, Body mass index is 37.33  kg/(m^2).   Hx stroke/TIA  Migraines  Other Active Problems  Frontal lobe brain cancer 1993 and 2005  Epislepsy,   Seizures post crani, on Keppra. No indication to adjust seizure medications  Secondary Parkinson's, radiation induced   Possible UTI. On empiric Vancomycin/ceftazidime  AKI  Firbromyalgia  GERD  Elevated INR  Hospital day # 2  [place] for Pager information 11/08/2014 5:17 PM  I have personally examined this patient, reviewed notes, independently viewed imaging studies, participated in medical decision making and plan of care. I have made any additions or clarifications directly to the above note. Agree with note above. Patient presented with generalized weakness right greater than left in the setting of hypotension and possible UTI and have worsening of her old deficits in the setting of systemic illness. I doubt she's had a new stroke but  . Long discussion  at the bedside with the patient's husband and answered questions. Stroke  team will sign off. Kindly call for questions.  Antony Contras, MD Medical Director [place] Pager: [phone] 11/08/2014 5:17 PM    To contact Stroke Continuity provider, please refer to [email or web address] After hours, contact General Neurology

## 2014-11-08 NOTE — Progress Notes (Signed)
[place] for Vancomycin/Ceftazidime  Indication: rule out sepsis, ?UTI source   Assessment: 59 y/o F on continues on broad spectrum antibiotics Cultures negative Afebrile   Goal of Therapy:  Vancomycin trough level 15-20 mcg/ml  Plan:  Continue Vancomycin 750 mg iv Q 12 hours Continue Fortaz 1 gram iv Q 8 hours Consider stopping antibiotics or streamlining         Allergies  Allergen Reactions  . Versed [Midazolam] Other (See Comments) and Anaphylaxis    Severe respiratory depression, had to be masked in preop holding.  . Ciprofloxacin     REACTION: NAUSEA AND VOMITING  . Cyclobenzaprine Itching    Other reaction(s): Itching / Pruritis (ALLERGY/intolerance)  . Methocarbamol     REACTION: itching Patient currently tolerating it as its on her home med list.  . Metronidazole Other (See Comments)    Unknown   . Penicillins Rash    REACTION: RASH   Labs:  Recent Labs  11/05/14 2145 11/05/14 2151 11/06/14 0311 11/08/14 0501  WBC 7.6  --  4.4 3.7*  HGB 11.8* 13.3 10.4* 9.1*  PLT 247  --  211 180  CREATININE 1.30* 1.20* 1.23* 0.91   Estimated Creatinine Clearance: 72.3 mL/min (by C-G formula based on Cr of 0.91).    Thank you Anette Guarneri, PharmD [phone]/02/2014,3:49 PM

## 2014-11-09 DIAGNOSIS — D500 Iron deficiency anemia secondary to blood loss (chronic): Secondary | ICD-10-CM

## 2014-11-09 DIAGNOSIS — N179 Acute kidney failure, unspecified: Secondary | ICD-10-CM

## 2014-11-09 LAB — CBC
HEMATOCRIT: 26.7 % — AB (ref 36.0–46.0)
Hemoglobin: 8.8 g/dL — ABNORMAL LOW (ref 12.0–15.0)
MCH: 29.2 pg (ref 26.0–34.0)
MCHC: 33 g/dL (ref 30.0–36.0)
MCV: 88.7 fL (ref 78.0–100.0)
PLATELETS: 181 10*3/uL (ref 150–400)
RBC: 3.01 MIL/uL — AB (ref 3.87–5.11)
RDW: 14.4 % (ref 11.5–15.5)
WBC: 3 10*3/uL — AB (ref 4.0–10.5)

## 2014-11-09 LAB — COMPREHENSIVE METABOLIC PANEL
ALBUMIN: 2.5 g/dL — AB (ref 3.5–5.0)
ALT: 12 U/L — AB (ref 14–54)
AST: 20 U/L (ref 15–41)
Alkaline Phosphatase: 35 U/L — ABNORMAL LOW (ref 38–126)
Anion gap: 6 (ref 5–15)
BUN: 10 mg/dL (ref 6–20)
CHLORIDE: 110 mmol/L (ref 101–111)
CO2: 25 mmol/L (ref 22–32)
Calcium: 8.3 mg/dL — ABNORMAL LOW (ref 8.9–10.3)
Creatinine, Ser: 0.82 mg/dL (ref 0.44–1.00)
GFR calc Af Amer: >60 mL/min (ref >60)
GFR calc non Af Amer: >60 mL/min (ref >60)
GLUCOSE: 85 mg/dL (ref 65–99)
POTASSIUM: 3.6 mmol/L (ref 3.5–5.1)
Sodium: 141 mmol/L (ref 135–145)
Total Bilirubin: 0.3 mg/dL (ref 0.3–1.2)
Total Protein: 4.8 g/dL — ABNORMAL LOW (ref 6.5–8.1)

## 2014-11-09 LAB — ACTH STIMULATION, 3 TIME POINTS
CORTISOL 30 MIN: 12.2 ug/dL
CORTISOL 60 MIN: 17.1 ug/dL
Cortisol, Base: 4.6 ug/dL

## 2014-11-09 LAB — ACTH: C206 ACTH: 16.6 pg/mL (ref 7.2–63.3)

## 2014-11-09 LAB — MAGNESIUM: Magnesium: 2 mg/dL (ref 1.7–2.4)

## 2014-11-09 MED ORDER — METHYLPREDNISOLONE SODIUM SUCC 125 MG IJ SOLR
60.0000 mg | Freq: Once | INTRAMUSCULAR | Status: AC
  Administered 2014-11-09: 60 mg via INTRAVENOUS
  Filled 2014-11-09: qty 2

## 2014-11-09 MED ORDER — PREDNISONE 20 MG PO TABS
20.0000 mg | ORAL_TABLET | Freq: Every day | ORAL | Status: DC
Start: 2014-11-10 — End: 2014-11-11
  Administered 2014-11-10 – 2014-11-11 (×2): 20 mg via ORAL
  Filled 2014-11-09 (×2): qty 1

## 2014-11-09 MED ORDER — DEXTROSE 5 % IV SOLN
1.0000 g | Freq: Three times a day (TID) | INTRAVENOUS | Status: AC
  Administered 2014-11-09 (×2): 1 g via INTRAVENOUS
  Filled 2014-11-09 (×2): qty 1

## 2014-11-09 NOTE — Evaluation (Signed)
Occupational Therapy Evaluation Patient Details Name: [name] MRN: [id number] DOB: 04/06/1955 Today's Date: 11/09/2014    History of Present Illness Pt admitted with AMS, syncope and hypotension due to UTI. 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  She transfers to a manual w/c with assistance, but does not ambulate.  Pt reports and appears to be functioning at her baseline.  No further OT needs. Signing off.    Follow Up Recommendations  No OT follow up;Supervision/Assistance - 24 hour    Equipment Recommendations  None recommended by OT    Recommendations for Other Services       Precautions / Restrictions Precautions Precautions: Fall Restrictions Weight Bearing Restrictions: No      Mobility Bed Mobility Overal bed mobility: Needs Assistance Bed Mobility: Supine to Sit     Supine to sit: Mod assist;HOB elevated     General bed mobility comments: use of rail, used pad to assist hips to EOB, min assist to raise trunk  Transfers Overall transfer level: Needs assistance Equipment used: 2 person hand held assist Transfers: Sit to/from Stand;Stand Pivot Transfers Sit to Stand: +2 physical assistance;Min assist Stand pivot transfers: +2 physical assistance;Min assist       General transfer comment: assist to rise and steady, pt took small pivotal steps to chair with increased time    Balance Overall balance assessment: Needs assistance Sitting-balance support: Feet supported Sitting balance-Leahy Scale: Fair       Standing balance-Leahy Scale: Poor                              ADL Overall ADL's : At baseline                                             Vision     Perception     Praxis      Pertinent Vitals/Pain Pain Assessment: No/denies pain     Hand  Dominance Right   Extremity/Trunk Assessment Upper Extremity Assessment Upper Extremity Assessment: Generalized weakness   Lower Extremity Assessment Lower Extremity Assessment: Defer to PT evaluation       Communication Communication Communication: No difficulties   Cognition Arousal/Alertness: Awake/alert Behavior During Therapy: WFL for tasks assessed/performed Overall Cognitive Status: History of cognitive impairments - at baseline       Memory: Decreased short-term memory             General Comments       Exercises       Shoulder Instructions      Home Living Family/patient expects to be discharged to:: Private residence Living Arrangements: Spouse/significant other Available Help at Discharge: Family Type of Home: House Home Access: Ramped entrance     Home Layout: One level     Bathroom Shower/Tub: Walk-in Hydrologist: Standard     Home Equipment: Environmental consultant - 2 wheels;Bedside commode;Wheelchair - manual;Shower seat   Additional Comments: Pt is a poor historian.      Prior Functioning/Environment Level of Independence: Needs assistance  Gait / Transfers Assistance Needed: Pt non ambulatory. Uses w/c for mobility. Assist with bed mobility and transfers at baseline. ADL's / [employer]  Needed: assisted for bathing, dressing, toileting, IADL, self feeds and grooms with set up        OT Diagnosis: Generalized weakness;Cognitive deficits   OT Problem List:     OT Treatment/Interventions:      OT Goals(Current goals can be found in the care plan section) Acute Rehab OT Goals Patient Stated Goal: return home  OT Frequency:     Barriers to D/C:            Co-evaluation PT/OT/SLP Co-Evaluation/Treatment: Yes Reason for Co-Treatment: For patient/therapist safety   OT goals addressed during session: ADL's and self-care      End of Session Equipment Utilized During Treatment: Gait belt Nurse Communication:  Mobility status  Activity Tolerance: Patient tolerated treatment well Patient left: in chair;with call bell/phone within reach;with chair alarm set   Time: 0131-4388 OT Time Calculation (min): 27 min Charges:  OT General Charges $OT Visit: 1 Procedure OT Evaluation $Initial OT Evaluation Tier I: 1 Procedure G-Codes:    Malka So 11/09/2014, 12:03 PM  [phone]

## 2014-11-09 NOTE — Evaluation (Signed)
Physical Therapy Evaluation Patient Details Name: [name] MRN: [id number] DOB: 10-Jan-1955 Today's Date: 11/09/2014   History of Present Illness  Pt admitted with AMS, syncope and hypotension due to UTI. 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 Pt currently with functional limitations due to the deficits listed below (see PT Problem List). At the time of PT eval pt was able to perform transfers with +2 assist. At baseline pt does not ambulate, and pt's husband assists her with a face-to-face transfer to/from bed. Pt states that she feels very near baseline, but admits to feeling some decline since admission. Will continue to follow acutely however pt declines HHPT at d/c.   Follow Up Recommendations No PT follow up;Supervision for mobility/OOB (Pt declines HHPT)    Equipment Recommendations  None recommended by PT    Recommendations for Other Services       Precautions / Restrictions Precautions Precautions: Fall Restrictions Weight Bearing Restrictions: No      Mobility  Bed Mobility Overal bed mobility: Needs Assistance Bed Mobility: Supine to Sit     Supine to sit: Mod assist;HOB elevated     General bed mobility comments: use of rail, used pad to assist hips to EOB, min assist to raise trunk  Transfers Overall transfer level: Needs assistance Equipment used: 2 person hand held assist Transfers: Sit to/from [employer] Transfers Sit to Stand: +2 physical assistance;Min assist Stand pivot transfers: +2 physical assistance;Min assist       General transfer comment: assist to rise and steady, pt took small pivotal steps to chair with increased time  Ambulation/Gait             General Gait Details: Non-ambulatory at baseline.  Stairs            Wheelchair Mobility    Modified Rankin (Stroke Patients  Only)       Balance Overall balance assessment: Needs assistance Sitting-balance support: Feet supported;No upper extremity supported Sitting balance-Leahy Scale: Fair     Standing balance support: Bilateral upper extremity supported;During functional activity Standing balance-Leahy Scale: Poor                               Pertinent Vitals/Pain Pain Assessment: No/denies pain    Home Living Family/patient expects to be discharged to:: Private residence Living Arrangements: Spouse/significant other Available Help at Discharge: Family Type of Home: House Home Access: Ramped entrance     Home Layout: One level Home Equipment: Environmental consultant - 2 wheels;Bedside commode;Wheelchair - manual;Shower seat Additional Comments: Pt is a poor historian.    Prior Function Level of Independence: Needs assistance   Gait / Transfers Assistance Needed: Pt non ambulatory. Uses w/c for mobility. Assist with bed mobility and transfers at baseline.  ADL's / Homemaking Assistance Needed: assisted for bathing, dressing, toileting, IADL, self feeds and grooms with set up        Hand Dominance   Dominant Hand: Right    Extremity/Trunk Assessment   Upper Extremity Assessment: Defer to OT evaluation           Lower Extremity Assessment: Generalized weakness         Communication   Communication: No difficulties  Cognition Arousal/Alertness: Awake/alert Behavior During Therapy: WFL for tasks assessed/performed Overall Cognitive Status: History of cognitive impairments - at baseline  Memory: Decreased short-term memory              General Comments      Exercises        Assessment/Plan    PT Assessment Patient needs continued PT services  PT Diagnosis Difficulty walking;Generalized weakness   PT Problem List Decreased strength;Decreased range of motion;Decreased activity tolerance;Decreased balance;Decreased mobility;Decreased knowledge of use of  DME;Decreased safety awareness;Decreased knowledge of precautions  PT Treatment Interventions DME instruction;Gait training;Stair training;Functional mobility training;Therapeutic activities;Therapeutic exercise;Neuromuscular re-education;Patient/family education   PT Goals (Current goals can be found in the Care Plan section) Acute Rehab PT Goals Patient Stated Goal: return home PT Goal Formulation: With patient Time For Goal Achievement: 11/16/14 Potential to Achieve Goals: Good    Frequency Min 3X/week   Barriers to discharge        Co-evaluation PT/OT/SLP Co-Evaluation/Treatment: Yes Reason for Co-Treatment: For patient/therapist safety PT goals addressed during session: Mobility/safety with mobility;Balance OT goals addressed during session: ADL's and self-care       End of Session Equipment Utilized During Treatment: Gait belt Activity Tolerance: Patient tolerated treatment well Patient left: in chair;with call bell/phone within reach;with chair alarm set Nurse Communication: Mobility status         Time: 1110-1135 PT Time Calculation (min) (ACUTE ONLY): 25 min   Charges:   PT Evaluation $Initial PT Evaluation Tier I: 1 Procedure     PT G CodesRolinda Roan Dec 07, 2014, 2:02 PM   Rolinda Roan, PT, DPT Acute Rehabilitation Services Pager: [phone]

## 2014-11-09 NOTE — Progress Notes (Addendum)
PROGRESS NOTE  [name] SWN:[id number] DOB: 1955/03/11 DOA: 11/05/2014 PCP: Annye Asa, MD  Admit HPI / Brief Narrative: 59 y.o. 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  The patient was in her usual state of health when her husband was helping her onto the commode. He noticed that she closed her eyes and rolled her head back and became unresponsive, not answering questions, and sluggish with movements. EMS noted SBP in the 80s, sluggishness, and ?right-sided weakness so code stroke was called on the way in.  In the ED, stroke team doubted stroke as the cause of her globally decreased sensorium. A noncontrasted CT head was unchanged from her previous. She was afebrile and had normal WBC. Her blood pressure improved somewhat with fluid bolus and a lactic acid level was normal. The urine was turbid with copious WBC and bacteria and so the patient was started on vancomycin, aztreonam, and Levaquin, and TRH was asked to admit for presumed sepsis from UTI.  Of note, the patient was complaining of dysuria 1 week ago and did take a burst of Bactrim on top of the daily nitrofurantoin last week with resolution of symptoms. Outpatient notes appear to show that her systolic blood pressure was in the 90s as far back as July. Her husband notes that the last few times at the doctors' office staff have had trouble getting her blood pressure with automatic cuffs. Nonetheless the patient continues to take olmesartan/HCTZ daily.  HPI/Subjective: Pt is resting comfortably in bed.  She has no new complaints. She has not yet been up and out of bed. At baseline she is not able to walk, but does stand to pivot w/  assist.  She denies cp, n/v, abdom pain, or sob.    Assessment/Plan:  Altered mental status- syncope  presented with a syncopal episode on the commode - due to combination of hypotension and UTI- did not formally meet SIRS criteria - mental status has now returned to her apparent baseline. Seen by neurology. No signs of a new stroke. Her right-sided weakness were likely due to unmasking of a previous stroke in the presence of UTI. Continue aspirin and statin.  Hypotension  BP appears stable at reported chronic baseline, but AM cortisol is low for the clinical scenario - +ve for Ad.Insuff.  Secondary adrenal insufficiency. Baseline a.m. cortisol was 4.9 in the presence of hypotension, ACTH stim test never exceeded 18. Will be given 1 dose of Solu-Medrol along with oral prednisone with outpatient endocrine follow-up.   Possible UTI very infected appearing urine by microscopy - urine culture not helpful - pt w/o fever or elevated WBC - will complete 5 days of abx then d/c and follow clinically   AKI The patient has a small increase in creatinine from previous baseline, presumably from ARB use and hypotension causing mild ischemia - this has resolved w/ volume expansion   HTN The patient has a reported history of essential hypertension, but has been hypotensive at recent office visits - discontinue ARB and HCTZ -   Epilepsy  Stable - continue Keppra 1500 mg BID   Fibromyalgia Stable  GERD  Stable  Elevated INR Resolved   Hypomagnesemia  Replaced  Code Status: FULL Family Communication: no family present at time  of exam Disposition Plan: adrenal eval - OT/PT evals - possible d/c home 24-48hrs   Consultants: Neurology   Procedures: Carotid doppler - 10/31 - no signif B stenosis  TTE - 10/31 - EF 60-65% - no WMA - grade 1 DD EEG - 11/1 - no epileptiform discharges   Antibiotics: Ceftaz 10/30 > Levaquin 10/30 Vancomycin 10/30 > 11/2 Aztreonam 10/30   DVT  prophylaxis: SCDs  Objective: Blood pressure 110/60, pulse 72, temperature 98 F (36.7 C), temperature source Oral, resp. rate 18, height 5\' 2"  (1.575 m), weight 92.6 kg (204 lb 2.3 oz), SpO2 98 %.  Intake/Output Summary (Last 24 hours) at 11/09/14 1036 Last data filed at 11/08/14 2130  Gross per 24 hour  Intake      3 ml  Output      0 ml  Net      3 ml   Exam: General: No acute respiratory distress - alert  Lungs: Clear to auscultation bilaterally without wheezes or crackles Cardiovascular: Regular rate and rhythm without murmur gallop or rub normal S1 and S2 Abdomen: Nontender, nondistended, soft, bowel sounds positive, no rebound, no ascites, no appreciable mass Extremities: No significant cyanosis, clubbing, or edema bilateral lower extremities   Data Reviewed: Basic Metabolic Panel:  Recent Labs Lab 11/05/14 2145 11/05/14 2151 11/06/14 0311 11/08/14 0501 11/09/14 0730  NA 135 141 141 141 141  K 3.6 3.6 4.2 3.7 3.6  CL 103 107 111 109 110  CO2 18*  --  23 24 25   GLUCOSE 108* 107* 89 97 85  BUN 17 19 16 11 10   CREATININE 1.30* 1.20* 1.23* 0.91 0.82  CALCIUM 8.9  --  8.5* 8.5* 8.3*  MG  --   --   --  1.4* 2.0    CBC:  Recent Labs Lab 11/05/14 2145 11/05/14 2151 11/06/14 0311 11/08/14 0501 11/09/14 0730  WBC 7.6  --  4.4 3.7* 3.0*  NEUTROABS 4.2  --   --  1.7  --   HGB 11.8* 13.3 10.4* 9.1* 8.8*  HCT 36.5 39.0 32.2* 27.6* 26.7*  MCV 91.3  --  91.0 89.0 88.7  PLT 247  --  211 180 181    Liver Function Tests:  Recent Labs Lab 11/05/14 2145 11/06/14 0311 11/08/14 0501 11/09/14 0730  AST 30 28 24 20   ALT 13* 14 13* 12*  ALKPHOS 33* 32* 32* 35*  BILITOT 0.8 0.5 0.5 0.3  PROT 6.8 5.9* 4.9* 4.8*  ALBUMIN 3.8 3.3* 2.6* 2.5*    Coags:  Recent Labs Lab 11/05/14 2230 11/06/14 0311  INR 1.61* 1.35    Cardiac Enzymes:  Recent Labs Lab 11/05/14 2145  TROPONINI <0.03    CBG:  Recent Labs Lab 11/05/14 2220  GLUCAP 99    Recent  Results (from the past 240 hour(s))  Blood Culture (routine x 2)     Status: None (Preliminary result)   Collection Time: 11/05/14 10:30 PM  Result Value Ref Range Status   Specimen Description BLOOD LEFT ANTECUBITAL  Final   Special Requests BOTTLES DRAWN AEROBIC AND ANAEROBIC 5CC   Final   Culture NO GROWTH 3 DAYS  Final   Report Status PENDING  Incomplete  Urine culture     Status: None   Collection Time: 11/05/14 10:40 PM  Result Value Ref Range Status   Specimen Description URINE, CATHETERIZED  Final   Special Requests NONE  Final   Culture MULTIPLE SPECIES PRESENT, SUGGEST RECOLLECTION  Final   Report Status 11/07/2014  FINAL  Final  Blood Culture (routine x 2)     Status: None (Preliminary result)   Collection Time: 11/05/14 11:11 PM  Result Value Ref Range Status   Specimen Description BLOOD RIGHT ANTECUBITAL  Final   Special Requests BOTTLES DRAWN AEROBIC AND ANAEROBIC 5CC  Final   Culture NO GROWTH 3 DAYS  Final   Report Status PENDING  Incomplete  MRSA PCR Screening     Status: None   Collection Time: 11/06/14  3:19 AM  Result Value Ref Range Status   MRSA by PCR NEGATIVE NEGATIVE Final    Comment:        The GeneXpert MRSA Assay (FDA approved for NASAL specimens only), is one component of a comprehensive MRSA colonization surveillance program. It is not intended to diagnose MRSA infection nor to guide or monitor treatment for MRSA infections.      Studies:   Recent x-ray studies have been reviewed in detail by me  Scheduled Meds:  Scheduled Meds: . aspirin EC  81 mg Oral Daily  . cefTAZidime (FORTAZ)  IV  1 g Intravenous 3 times per day  . DULoxetine  60 mg Oral Daily  . fenofibrate  160 mg Oral Daily  . fesoterodine  8 mg Oral Daily  . levETIRAcetam  1,500 mg Oral BID  . methylPREDNISolone (SOLU-MEDROL) injection  60 mg Intravenous Once  . pantoprazole  40 mg Oral Daily  . [START ON 11/10/2014] predniSONE  20 mg Oral Q breakfast  . pregabalin  150 mg  Oral BID  . rosuvastatin  10 mg Oral Q M,W,F  . sodium chloride  3 mL Intravenous Q12H    Time spent on care of this patient: 35 mins  Lala Lund K M.D on 11/09/2014 at 10:37 AM  Between 7am to 7pm - Pager - [phone], After 7pm go to [email or web address] - password [email or web address]  [phone]   LOS: 3 days

## 2014-11-10 ENCOUNTER — Inpatient Hospital Stay (HOSPITAL_COMMUNITY): Payer: 59

## 2014-11-10 DIAGNOSIS — I10 Essential (primary) hypertension: Secondary | ICD-10-CM

## 2014-11-10 DIAGNOSIS — I952 Hypotension due to drugs: Secondary | ICD-10-CM

## 2014-11-10 DIAGNOSIS — M797 Fibromyalgia: Secondary | ICD-10-CM

## 2014-11-10 LAB — CULTURE, BLOOD (ROUTINE X 2)
CULTURE: NO GROWTH
Culture: NO GROWTH

## 2014-11-10 NOTE — Progress Notes (Signed)
PROGRESS NOTE  [name]:[id number] DOB: Nov 01, 1955 DOA: 11/05/2014 PCP: Annye Asa, MD  Admit HPI / Brief Narrative: 59 y.o. 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  The patient was in her usual state of health when her husband was helping her onto the commode. He noticed that she closed her eyes and rolled her head back and became unresponsive, not answering questions, and sluggish with movements. EMS noted SBP in the 80s, sluggishness, and ?right-sided weakness so code stroke was called on the way in.  In the ED, stroke team doubted stroke as the cause of her globally decreased sensorium. A noncontrasted CT head was unchanged from her previous. She was afebrile and had normal WBC. Her blood pressure improved somewhat with fluid bolus and a lactic acid level was normal. The urine was turbid with copious WBC and bacteria and so the patient was started on vancomycin, aztreonam, and Levaquin, and TRH was asked to admit for presumed sepsis from UTI.  Of note, the patient was complaining of dysuria 1 week ago and did take a burst of Bactrim on top of the daily nitrofurantoin last week with resolution of symptoms. Outpatient notes appear to show that her systolic blood pressure was in the 90s as far back as July. Her husband notes that the last few times at the doctors' office staff have had trouble getting her blood pressure with automatic cuffs. Nonetheless the patient continues to take olmesartan/HCTZ daily.  HPI/Subjective: Pt is resting comfortably in bed.  She has no new complaints. She has not yet been up and out of bed. At baseline she is not able to walk, but does stand to pivot w/  assist.  She denies cp, n/v, abdom pain, or sob.    Assessment/Plan:  Altered mental status- syncope  presented with a syncopal episode on the commode - due to combination of hypotension and UTI- did not formally meet SIRS criteria - mental status has now returned to her apparent baseline. Seen by neurology. No signs of a new stroke. Her right-sided weakness were likely due to unmasking of a previous stroke in the presence of UTI. Continue aspirin and statin.  Hypotension  BP appears stable at reported chronic baseline, but AM cortisol is low for the clinical scenario - +ve for Ad.Insuff. Placed on steroids.  Secondary adrenal insufficiency. Baseline a.m. cortisol was 4.9 in the presence of hypotension, ACTH stim test never exceeded 18. Will be given 1 dose of Solu-Medrol along with oral prednisone with outpatient endocrine follow-up.   Possible UTI Urine culture remained negative, she has finished her IV antibiotic regimen. However according to the husband this is her fifth episode of UTI in the last several months, will check a renal ultrasound to rule out any obstruction.  AKI The patient has a small increase in creatinine from previous baseline, presumably from ARB use and hypotension causing mild ischemia - this has resolved w/ volume expansion   HTN The patient has a reported history of essential hypertension, but has been hypotensive at recent office visits - discontinue ARB and HCTZ -   Epilepsy  Stable - continue Keppra 1500 mg BID   Fibromyalgia Stable  GERD  Stable  Elevated INR Resolved   Hypomagnesemia  Replaced  Code Status: FULL Family Communication: no family present at time of exam Disposition Plan: adrenal eval - OT/PT evals - possible d/c home 24-48hrs   Consultants: Neurology   Procedures: Carotid doppler - 10/31 - no signif B stenosis  TTE - 10/31 - EF 60-65% - no WMA - grade 1 DD EEG - 11/1 - no epileptiform discharges  Renal Ultrasound  ordered 11/10/2014  Anti-infectives    Start     Dose/Rate Route Frequency Ordered Stop   11/09/14 1400  cefTAZidime (FORTAZ) 1 g in dextrose 5 % 50 mL IVPB     1 g 100 mL/hr over 30 Minutes Intravenous 3 times per day 11/09/14 1131 11/09/14 2148   11/06/14 1000  vancomycin (VANCOCIN) IVPB 750 mg/150 ml premix  Status:  Discontinued     750 mg 150 mL/hr over 60 Minutes Intravenous Every 12 hours 11/06/14 0251 11/08/14 1737   11/06/14 0600  cefTAZidime (FORTAZ) 1 g in dextrose 5 % 50 mL IVPB  Status:  Discontinued     1 g 100 mL/hr over 30 Minutes Intravenous 3 times per day 11/06/14 0251 11/09/14 1131   11/05/14 2230  levofloxacin (LEVAQUIN) IVPB 750 mg     750 mg 100 mL/hr over 90 Minutes Intravenous  Once 11/05/14 2226 11/06/14 0135   11/05/14 2230  aztreonam (AZACTAM) 2 g in dextrose 5 % 50 mL IVPB     2 g 100 mL/hr over 30 Minutes Intravenous  Once 11/05/14 2226 11/06/14 0008   11/05/14 2230  vancomycin (VANCOCIN) IVPB 1000 mg/200 mL premix     1,000 mg 200 mL/hr over 60 Minutes Intravenous  Once 11/05/14 2226 11/06/14 0204        DVT prophylaxis: SCDs  Objective: Blood pressure 104/65, pulse 85, temperature 98.4 F (36.9 C), temperature source Oral, resp. rate 16, height 5\' 2"  (1.575 m), weight 92.6 kg (204 lb 2.3 oz), SpO2 96 %.  Intake/Output Summary (Last 24 hours) at 11/10/14 1020 Last data filed at 11/10/14 0900  Gross per 24 hour  Intake    480 ml  Output      0 ml  Net    480 ml   Exam: General: No acute respiratory distress - alert  Lungs: Clear to auscultation bilaterally without wheezes or crackles Cardiovascular: Regular rate and rhythm without murmur gallop or rub normal S1 and S2 Abdomen: Nontender, nondistended, soft, bowel sounds positive, no rebound, no ascites, no appreciable mass Extremities: No significant cyanosis, clubbing, or edema bilateral lower extremities   Data Reviewed: Basic Metabolic Panel:  Recent Labs Lab 11/05/14 2145  11/05/14 2151 11/06/14 0311 11/08/14 0501 11/09/14 0730  NA 135 141 141 141 141  K 3.6 3.6 4.2 3.7 3.6  CL 103 107 111 109 110  CO2 18*  --  23 24 25   GLUCOSE 108* 107* 89 97 85  BUN 17 19 16 11 10   CREATININE 1.30* 1.20* 1.23* 0.91 0.82  CALCIUM 8.9  --  8.5* 8.5* 8.3*  MG  --   --   --  1.4* 2.0    CBC:  Recent Labs Lab 11/05/14 2145 11/05/14 2151 11/06/14 0311 11/08/14 0501 11/09/14 0730  WBC 7.6  --  4.4 3.7* 3.0*  NEUTROABS 4.2  --   --  1.7  --   HGB 11.8* 13.3 10.4* 9.1* 8.8*  HCT 36.5 39.0 32.2* 27.6* 26.7*  MCV 91.3  --  91.0 89.0 88.7  PLT 247  --  211 180 181    Liver  Function Tests:  Recent Labs Lab 11/05/14 2145 11/06/14 0311 11/08/14 0501 11/09/14 0730  AST 30 28 24 20   ALT 13* 14 13* 12*  ALKPHOS 33* 32* 32* 35*  BILITOT 0.8 0.5 0.5 0.3  PROT 6.8 5.9* 4.9* 4.8*  ALBUMIN 3.8 3.3* 2.6* 2.5*    Coags:  Recent Labs Lab 11/05/14 2230 11/06/14 0311  INR 1.61* 1.35    Cardiac Enzymes:  Recent Labs Lab 11/05/14 2145  TROPONINI <0.03    CBG:  Recent Labs Lab 11/05/14 2220  GLUCAP 99    Recent Results (from the past 240 hour(s))  Blood Culture (routine x 2)     Status: None (Preliminary result)   Collection Time: 11/05/14 10:30 PM  Result Value Ref Range Status   Specimen Description BLOOD LEFT ANTECUBITAL  Final   Special Requests BOTTLES DRAWN AEROBIC AND ANAEROBIC 5CC   Final   Culture NO GROWTH 4 DAYS  Final   Report Status PENDING  Incomplete  Urine culture     Status: None   Collection Time: 11/05/14 10:40 PM  Result Value Ref Range Status   Specimen Description URINE, CATHETERIZED  Final   Special Requests NONE  Final   Culture MULTIPLE SPECIES PRESENT, SUGGEST RECOLLECTION  Final   Report Status 11/07/2014 FINAL  Final  Blood Culture (routine x 2)     Status: None (Preliminary result)   Collection Time: 11/05/14 11:11 PM  Result Value Ref Range Status   Specimen Description BLOOD RIGHT ANTECUBITAL  Final    Special Requests BOTTLES DRAWN AEROBIC AND ANAEROBIC 5CC  Final   Culture NO GROWTH 4 DAYS  Final   Report Status PENDING  Incomplete  MRSA PCR Screening     Status: None   Collection Time: 11/06/14  3:19 AM  Result Value Ref Range Status   MRSA by PCR NEGATIVE NEGATIVE Final    Comment:        The GeneXpert MRSA Assay (FDA approved for NASAL specimens only), is one component of a comprehensive MRSA colonization surveillance program. It is not intended to diagnose MRSA infection nor to guide or monitor treatment for MRSA infections.      Studies:   Recent x-ray studies have been reviewed in detail by me  Scheduled Meds:  Scheduled Meds: . aspirin EC  81 mg Oral Daily  . DULoxetine  60 mg Oral Daily  . fenofibrate  160 mg Oral Daily  . fesoterodine  8 mg Oral Daily  . levETIRAcetam  1,500 mg Oral BID  . pantoprazole  40 mg Oral Daily  . predniSONE  20 mg Oral Q breakfast  . pregabalin  150 mg Oral BID  . rosuvastatin  10 mg Oral Q M,W,F  . sodium chloride  3 mL Intravenous Q12H    Time spent on care of this patient: 35 mins  Lala Lund K M.D on 11/10/2014 at 10:20 AM  Between 7am to 7pm - Pager - [phone], After 7pm go to [email or web address] - password [email or web address]  [phone]   LOS: 4 days

## 2014-11-11 DIAGNOSIS — C711 Malignant neoplasm of frontal lobe: Secondary | ICD-10-CM

## 2014-11-11 DIAGNOSIS — G40409 Other generalized epilepsy and epileptic syndromes, not intractable, without status epilepticus: Secondary | ICD-10-CM

## 2014-11-11 MED ORDER — PREDNISONE 20 MG PO TABS: 20.0000 mg | ORAL_TABLET | Freq: Every day | ORAL | Status: DC

## 2014-11-11 NOTE — Discharge Instructions (Signed)
Follow with Primary MD Annye Asa, MD in 7 days   Get CBC, CMP, 2 view Chest X ray checked  by Primary MD next visit.    Activity: As tolerated with Full fall precautions use walker/cane & assistance as needed   Disposition Home     Diet: Heart Healthy.  For Heart failure patients - Check your Weight same time everyday, if you gain over 2 pounds, or you develop in leg swelling, experience more shortness of breath or chest pain, call your Primary MD immediately. Follow Cardiac Low Salt Diet and 1.5 lit/day fluid restriction.   On your next visit with your primary care physician please Get Medicines reviewed and adjusted.   Please request your Prim.MD to go over all Hospital Tests and Procedure/Radiological results at the follow up, please get all Hospital records sent to your Prim MD by signing hospital release before you go home.   If you experience worsening of your admission symptoms, develop shortness of breath, life threatening emergency, suicidal or homicidal thoughts you must seek medical attention immediately by calling 911 or calling your MD immediately  if symptoms less severe.  You Must read complete instructions/literature along with all the possible adverse reactions/side effects for all the Medicines you take and that have been prescribed to you. Take any new Medicines after you have completely understood and accpet all the possible adverse reactions/side effects.   Do not drive, operating heavy machinery, perform activities at heights, swimming or participation in water activities or provide baby sitting services if your were admitted for syncope or siezures until you have seen by Primary MD or a Neurologist and advised to do so again.  Do not drive when taking Pain medications.    Do not take more than prescribed Pain, Sleep and Anxiety Medications  Special Instructions: If you have smoked or chewed Tobacco  in the last 2 yrs please stop smoking, stop any regular  Alcohol  and or any Recreational drug use.  Wear Seat belts while driving.   Please note  You were cared for by a hospitalist during your hospital stay. If you have any questions about your discharge medications or the care you received while you were in the hospital after you are discharged, you can call the unit and asked to speak with the hospitalist on call if the hospitalist that took care of you is not available. Once you are discharged, your primary care physician will handle any further medical issues. Please note that NO REFILLS for any discharge medications will be authorized once you are discharged, as it is imperative that you return to your primary care physician (or establish a relationship with a primary care physician if you do not have one) for your aftercare needs so that they can reassess your need for medications and monitor your lab values.

## 2014-11-11 NOTE — Discharge Summary (Signed)
[name], is a 59 y.o. female  DOB Sep 08, 1955  MRN [id number].  Admission date:  11/05/2014  Admitting Physician  Edwin Dada, MD  Discharge Date:  11/11/2014   Primary MD  Annye Asa, MD  Recommendations for primary care physician for things to follow:   Monitor blood pressure and orthostatics, needs outpatient endocrine follow-up for adrenal insufficiency.   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 Brain cancer ([place])     Frontal lobe, 1993 and 2005  . Migraines   . Morton neuroma   . Seizures ([place])   . Fibromyalgia   . Right fibular fracture 07/12/2011  . Malignant neoplasm of frontal lobe of brain ([place]) 09/18/2008    Qualifier: Diagnosis of  By: Birdie Riddle MD, Belenda Cruise    . Anemia 03/06/2013  . Secondary Parkinson disease ([place])   . Bladder spasm   . Hyperchloremia   . Hypertension   . GERD (gastroesophageal reflux disease)   . Stroke [facility])     Past Surgical History  Procedure Laterality Date  . Appendectomy  1976  . Excision morton's neuroma      right foot  . Brain cancer      1993 and 2005  . Orif ankle fracture  07/12/2011    Procedure: OPEN REDUCTION INTERNAL FIXATION (ORIF)  ANKLE FRACTURE;  Surgeon: Johnny Bridge, MD;  Location: [place];  Service: Orthopedics;  Laterality: Right;  . Cholecystectomy  08/02/2013    Procedure: LAPAROSCOPIC CHOLECYSTECTOMY;  Surgeon: Gwenyth Ober, MD;  Location: [facility] OR;  Service: General;;  . Esophagogastroduodenoscopy N/A 12/28/2013    Procedure: ESOPHAGOGASTRODUODENOSCOPY (EGD);  Surgeon: Inda Castle, MD;  Location: [place];  Service: Endoscopy;  Laterality: N/A;  . Savory dilation N/A 12/28/2013    Procedure: SAVORY DILATION;  Surgeon: Inda Castle, MD;  Location: [place];  Service: Endoscopy;  Laterality: N/A;       HPI  from the history and physical done on the day of admission:    [name] is a 59 y.o. 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  The patient was in her usual state of health until today at around 8:00 in the evening when her husband was helping her on the commode. He noticed that she closed her eyes and rolled her head back and was unresponsive, not answering questions, and sluggish with movements. EMS noted SBP in the 80s, sluggishness, and ?right-sided weakness so code stroke was called on the way in.  In the ED, stroke team doubted stroke as the cause of her globally decreased sensorium. A noncontrasted CT head was unchanged from her previous. She was afebrile and had normal WBC. Her blood pressure improved somewhat with fluid bolus and a lactic acid level was normal. The urine was turbid with copious WBC and bacteria and so the patient was started on vancomycin, aztreonam, and Levaquin, and TRH was asked to admit for presumed sepsis from UTI (she was admitted to [facility] for this one year  ago).  Of note, the patient was complaining of dysuria 1 week ago and they did take a burst of Bactrim on top of the daily nitrofurantoin last week which was completed and with resolution of symptoms. Also, the patient's last outpatient notes appear to show that her systolic blood pressure was in the 90s as far back as July. Her husband notes that the last few times at the doctors' office staff have had trouble getting her blood pressure with automatic cuffs. Nonetheless the patient continues to take olmesartan HCTZ daily.     Hospital Course:     Altered mental status- syncope - presented with a syncopal episode on the commode - due to combination of hypotension and UTI- did not formally meet SIRS criteria - mental status has now returned to her apparent baseline. Seen by neurology. No signs of a new stroke. Her right-sided weakness were likely due to unmasking of a previous stroke in the presence of UTI. Continue aspirin and statin.  Hypotension BP appears stable at reported chronic baseline, but AM cortisol is low for the clinical scenario - +ve for Ad.Insuff. Placed on steroids and blood pressure has stabilized.  Secondary adrenal insufficiency. Baseline a.m. cortisol was 4.9 in the presence of hypotension, ACTH stim test never exceeded 18. Will be given 1 dose of Solu-Medrol along with oral prednisone with outpatient endocrine follow-up.   Possible UTI- cultures remain negative has finished antibiotics. Renal ultrasound is stable. She follows with urologist Dr. Vikki Ports and will continue to do so. She is already on chronic antibiotics for suppressive therapy. We'll defer this to urologist.   AKI - The patient has a small increase in creatinine from previous baseline, presumably from ARB use and hypotension causing mild ischemia - this has resolved w/ volume expansion   HTN The patient has a reported history of essential hypertension, but has been hypotensive at recent office visits -  discontinued ARB and HCTZ -   Epilepsy  Stable - continue Keppra 1500 mg BID   Fibromyalgia Stable  GERD  Stable         Discharge Condition:  Home  Follow UP  Follow-up Information    Follow up with Annye Asa, MD. Schedule an appointment as soon as possible for a visit in 1 week.   Specialty:  Family Medicine   Contact information:   [address] [place] [address] [phone]       Follow up with Renato Shin, MD. Schedule an appointment as soon as possible for a visit in 3 days.   Specialty:  Endocrinology  Why:  adrenal insufficiency    Contact information:   [address] [place] [address] [phone]        Consults obtained - neurology  Diet and Activity recommendation: See Discharge Instructions below  Discharge Instructions       Discharge Instructions    Diet - low sodium heart healthy    Complete by:  As directed      Discharge instructions    Complete by:  As directed   Follow with Primary MD Annye Asa, MD in 7 days   Get CBC, CMP, 2 view Chest X ray checked  by Primary MD next visit.    Activity: As tolerated with Full fall precautions use walker/cane & assistance as needed   Disposition Home     Diet: Heart Healthy.  For Heart failure patients - Check your Weight same time everyday, if you gain over 2 pounds, or you develop in leg swelling, experience more shortness of breath or chest pain, call your Primary MD immediately. Follow Cardiac Low Salt Diet and 1.5 lit/day fluid restriction.   On your next visit with your primary care physician please Get Medicines reviewed and adjusted.   Please request your Prim.MD to go over all Hospital Tests and Procedure/Radiological results at the follow up, please get all Hospital records sent to your Prim MD by signing hospital release before you go home.   If you experience worsening of your admission symptoms, develop shortness of  breath, life threatening emergency, suicidal or homicidal thoughts you must seek medical attention immediately by calling 911 or calling your MD immediately  if symptoms less severe.  You Must read complete instructions/literature along with all the possible adverse reactions/side effects for all the Medicines you take and that have been prescribed to you. Take any new Medicines after you have completely understood and accpet all the possible adverse reactions/side effects.   Do not drive, operating heavy machinery, perform activities at heights, swimming or participation in water activities or provide baby sitting services if your were admitted for syncope or siezures until you have seen by Primary MD or a Neurologist and advised to do so again.  Do not drive when taking Pain medications.    Do not take more than prescribed Pain, Sleep and Anxiety Medications  Special Instructions: If you have smoked or chewed Tobacco  in the last 2 yrs please stop smoking, stop any regular Alcohol  and or any Recreational drug use.  Wear Seat belts while driving.   Please note  You were cared for by a hospitalist during your hospital stay. If you have any questions about your discharge medications or the care you received while you were in the hospital after you are discharged, you can call the unit and asked to speak with the hospitalist on call if the hospitalist that took care of you is not available. Once you are discharged, your primary care physician will handle any further medical issues. Please note that NO REFILLS for any discharge medications will be authorized once you are discharged, as it is imperative that you return to your primary care physician (or establish a relationship with a primary care physician if you do not have one) for your aftercare needs so that they can reassess your need for medications and monitor your lab values.     Increase activity slowly    Complete by:  As directed  Discharge Medications       Medication List    TAKE these medications        aspirin 81 MG EC tablet  Take 1 tablet (81 mg total) by mouth daily.     aspirin-acetaminophen-caffeine 458-099-83 MG tablet  Commonly known as:  EXCEDRIN MIGRAINE  Take by mouth every 6 (six) hours as needed for headache.     CRESTOR 10 MG tablet  Generic drug:  rosuvastatin  Take 1 tablet by mouth   every Monday, Wednesday,   Friday     DULoxetine 60 MG capsule  Commonly known as:  CYMBALTA  TAKE 1 CAPSULE BY MOUTH DAILY     fenofibrate 160 MG tablet  Take 1 tablet (160 mg total) by mouth daily.     fesoterodine 8 MG Tb24 tablet  Commonly known as:  TOVIAZ  Take 8 mg by mouth daily.     KEPPRA 500 MG tablet  Generic drug:  levETIRAcetam  Take 1,500 mg by mouth 2 (two) times daily.     Linaclotide 145 MCG Caps capsule  Commonly known as:  LINZESS  Take 145 mcg by mouth daily as needed (constipation).     methocarbamol 500 MG tablet  Commonly known as:  ROBAXIN  Take 500 mg by mouth daily as needed for muscle spasms.     nitrofurantoin 50 MG capsule  Commonly known as:  MACRODANTIN  Take 50 mg by mouth 2 (two) times daily. Maintenance     omeprazole 20 MG capsule  Commonly known as:  PRILOSEC  TAKE 1 CAPSULE BY MOUTH DAILY     predniSONE 20 MG tablet  Commonly known as:  DELTASONE  Take 1 tablet (20 mg total) by mouth daily with breakfast.     pregabalin 150 MG capsule  Commonly known as:  LYRICA  Take 1 capsule (150 mg total) by mouth 2 (two) times daily.     promethazine 25 MG tablet  Commonly known as:  PHENERGAN  Take 1 tablet (25 mg total) by mouth every 6 (six) hours as needed for nausea or vomiting.     TRIMETHOPRIM PO  Take 100 mg by mouth daily. When active UTI        Major procedures and Radiology Reports - PLEASE review detailed and final reports for all details, in brief -   Carotid doppler - 10/31 - no signif B stenosis  TTE - 10/31 - EF 60-65%  - no WMA - grade 1 DD EEG - 11/1 - no epileptiform discharges  Renal Ultrasound ordered 11/10/2014    Ct Head Wo Contrast  11/05/2014  CLINICAL DATA:  Right-sided weakness. History of resection for malignancy. EXAM: CT HEAD WITHOUT CONTRAST TECHNIQUE: Contiguous axial images were obtained from the base of the skull through the vertex without intravenous contrast. COMPARISON:  MRI 12/27/2013, CT 02/10/2009. FINDINGS: There is no intracranial hemorrhage, mass or evidence of acute infarction. There is encephalomalacia and prior resection in the left frontal lobe, stable. White matter hypodensities, left greater than right, are unchanged and likely represent treatment related changes. No significant extra-axial fluid collection. No significant skeletal abnormalities. IMPRESSION: No acute findings. There are stable treatment related changes and prior left frontal resection. Chronic hemispheric white matter hypodensities likely represent treatment related changes. Critical Value/emergent results were called by telephone at the time of interpretation on 11/05/2014 at 10:06 pm to Dr. Armida Sans, who verbally acknowledged these results. Electronically Signed   By: Andreas Newport M.D.   On: 11/05/2014 22:08  Mr Virgel Paling Wo Contrast  11/06/2014  CLINICAL DATA:  Initial evaluation for acute onset right-sided weakness, now resolved. EXAM: MRI HEAD WITHOUT CONTRAST MRA HEAD WITHOUT CONTRAST TECHNIQUE: Multiplanar, multiecho pulse sequences of the brain and surrounding structures were obtained without intravenous contrast. Angiographic images of the head were obtained using MRA technique without contrast. COMPARISON:  Prior CT from earlier the same day as well as previous MRI from 12/28/2014. FINDINGS: MRI HEAD FINDINGS Postoperative changes from prior left frontal lobe craniotomy with tumor resection in the anterior left frontal lobe again seen. Overall, these changes are stable relative to most recent MRI. There is  associated ex vacuo dilatation of the left lateral ventricle. Chronic white matter changes involving the cerebral white matter, left greater than right, similar to previous. These may in part reflect post therapy changes. Cerebral atrophy again noted. No abnormal foci of restricted diffusion to suggest acute intracranial infarct. No abnormal foci of restricted diffusion to suggest acute intracranial infarct. Normal intravascular flow voids maintained. No acute intracranial hemorrhage. No mass lesion, mass effect, or midline shift. No hydrocephalus. No extra-axial fluid collection. Craniocervical junction within normal limits. Incidental note made of a partially empty sella. No acute abnormality about the orbits. Scattered mucosal thickening within the ethmoidal air cells. Trace layering opacity within the right sphenoid sinus. Paranasal sinuses are otherwise clear. No mastoid effusion. Inner ear structures within normal limits. Bone marrow signal intensity within normal limits. No scalp soft tissue abnormality. MRA HEAD FINDINGS ANTERIOR CIRCULATION: Visualized portions of the distal cervical segments of the internal carotid arteries are patent with antegrade flow. Petrous, cavernous, and supra clinoid segments are well opacified. A1 segments, anterior communicating artery, and anterior cerebral arteries widely patent. M1 segments widely patent without stenosis or occlusion. Attenuation of the distal left MCA branches, likely postoperative in nature. Right MCA branches well opacified. POSTERIOR CIRCULATION: Vertebral arteries are patent to the vertebrobasilar junction. Left posterior inferior cerebral artery patent. Right posterior inferior cerebral artery not visualized. Basilar artery widely patent. Dominant right anterior inferior cerebellar artery noted. Superior cerebellar arteries patent bilaterally. Both posterior cerebral arteries arise from the basilar artery are well opacified to their distal aspects. Mild  atheromatous irregularity within the distal right PCA. No aneurysm or vascular malformation. IMPRESSION: MRI HEAD IMPRESSION: 1. No acute intracranial infarct or other process identified. 2. Status post left frontal craniotomy for resection of presumed anterior left frontal lobe mass. Overall, these changes are similar relative to prior study. 3. Stable cerebral atrophy. MRA HEAD IMPRESSION: 1. Stable MRA of the intracranial circulation. No proximal branch occlusion or hemodynamically significant stenosis. 2. Irregularity with attenuation of the distal left MCA branches, likely postoperative in nature. 3. Mild distal small vessel atheromatous irregularity within the distal right PCA. Electronically Signed   By: Jeannine Boga M.D.   On: 11/06/2014 03:09   Mr Brain Wo Contrast  11/06/2014  CLINICAL DATA:  Initial evaluation for acute onset right-sided weakness, now resolved. EXAM: MRI HEAD WITHOUT CONTRAST MRA HEAD WITHOUT CONTRAST TECHNIQUE: Multiplanar, multiecho pulse sequences of the brain and surrounding structures were obtained without intravenous contrast. Angiographic images of the head were obtained using MRA technique without contrast. COMPARISON:  Prior CT from earlier the same day as well as previous MRI from 12/28/2014. FINDINGS: MRI HEAD FINDINGS Postoperative changes from prior left frontal lobe craniotomy with tumor resection in the anterior left frontal lobe again seen. Overall, these changes are stable relative to most recent MRI. There is associated ex vacuo dilatation of  the left lateral ventricle. Chronic white matter changes involving the cerebral white matter, left greater than right, similar to previous. These may in part reflect post therapy changes. Cerebral atrophy again noted. No abnormal foci of restricted diffusion to suggest acute intracranial infarct. No abnormal foci of restricted diffusion to suggest acute intracranial infarct. Normal intravascular flow voids maintained. No  acute intracranial hemorrhage. No mass lesion, mass effect, or midline shift. No hydrocephalus. No extra-axial fluid collection. Craniocervical junction within normal limits. Incidental note made of a partially empty sella. No acute abnormality about the orbits. Scattered mucosal thickening within the ethmoidal air cells. Trace layering opacity within the right sphenoid sinus. Paranasal sinuses are otherwise clear. No mastoid effusion. Inner ear structures within normal limits. Bone marrow signal intensity within normal limits. No scalp soft tissue abnormality. MRA HEAD FINDINGS ANTERIOR CIRCULATION: Visualized portions of the distal cervical segments of the internal carotid arteries are patent with antegrade flow. Petrous, cavernous, and supra clinoid segments are well opacified. A1 segments, anterior communicating artery, and anterior cerebral arteries widely patent. M1 segments widely patent without stenosis or occlusion. Attenuation of the distal left MCA branches, likely postoperative in nature. Right MCA branches well opacified. POSTERIOR CIRCULATION: Vertebral arteries are patent to the vertebrobasilar junction. Left posterior inferior cerebral artery patent. Right posterior inferior cerebral artery not visualized. Basilar artery widely patent. Dominant right anterior inferior cerebellar artery noted. Superior cerebellar arteries patent bilaterally. Both posterior cerebral arteries arise from the basilar artery are well opacified to their distal aspects. Mild atheromatous irregularity within the distal right PCA. No aneurysm or vascular malformation. IMPRESSION: MRI HEAD IMPRESSION: 1. No acute intracranial infarct or other process identified. 2. Status post left frontal craniotomy for resection of presumed anterior left frontal lobe mass. Overall, these changes are similar relative to prior study. 3. Stable cerebral atrophy. MRA HEAD IMPRESSION: 1. Stable MRA of the intracranial circulation. No proximal  branch occlusion or hemodynamically significant stenosis. 2. Irregularity with attenuation of the distal left MCA branches, likely postoperative in nature. 3. Mild distal small vessel atheromatous irregularity within the distal right PCA. Electronically Signed   By: Jeannine Boga M.D.   On: 11/06/2014 03:09   US Renal  11/10/2014  CLINICAL DATA:  Acute renal failure. EXAM: RENAL / URINARY TRACT ULTRASOUND COMPLETE COMPARISON:  CT scan of July 31, 2013. FINDINGS:  IMAGING IS SOMEWHAT LIMITED DUE TO BODY HABITUS.: FINDINGS:  IMAGING IS SOMEWHAT LIMITED DUE TO BODY HABITUS. Right Kidney: Length: 8.7 cm. Echogenicity is not well-visualized due to body habitus. No mass or hydronephrosis visualized. Left Kidney: Length: 8.4 cm. Cortical thinning is noted. Echogenicity is within normal limits. No mass or hydronephrosis visualized. Bladder: Appears normal for degree of bladder distention. IMPRESSION: Imaging is somewhat limited due to body habitus. Moderate bilateral renal atrophy is noted, with cortical thinning seen on the left. No hydronephrosis or renal obstruction is noted. Electronically Signed   By: Marijo Conception, M.D.   On: 11/10/2014 10:38   Dg Chest Port 1 View  11/05/2014  CLINICAL DATA:  Hypotension, new onset RIGHT-sided weakness and altered mental status. History of brain cancer, stroke. EXAM: PORTABLE CHEST 1 VIEW COMPARISON:  Chest radiograph December 27, 2013 FINDINGS: Cardiomediastinal silhouette is unremarkable for this low inspiratory portable examination with crowded vasculature markings. The lungs are clear without pleural effusions or focal consolidations. Trachea projects midline and there is no pneumothorax. Included soft tissue planes and osseous structures are non-suspicious. IMPRESSION: No acute cardiopulmonary process. Electronically Signed  By: Elon Alas M.D.   On: 11/05/2014 23:46    Micro Results      Recent Results (from the past 240 hour(s))  Blood Culture  (routine x 2)     Status: None   Collection Time: 11/05/14 10:30 PM  Result Value Ref Range Status   Specimen Description BLOOD LEFT ANTECUBITAL  Final   Special Requests BOTTLES DRAWN AEROBIC AND ANAEROBIC 5CC   Final   Culture NO GROWTH 5 DAYS  Final   Report Status 11/10/2014 FINAL  Final  Urine culture     Status: None   Collection Time: 11/05/14 10:40 PM  Result Value Ref Range Status   Specimen Description URINE, CATHETERIZED  Final   Special Requests NONE  Final   Culture MULTIPLE SPECIES PRESENT, SUGGEST RECOLLECTION  Final   Report Status 11/07/2014 FINAL  Final  Blood Culture (routine x 2)     Status: None   Collection Time: 11/05/14 11:11 PM  Result Value Ref Range Status   Specimen Description BLOOD RIGHT ANTECUBITAL  Final   Special Requests BOTTLES DRAWN AEROBIC AND ANAEROBIC 5CC  Final   Culture NO GROWTH 5 DAYS  Final   Report Status 11/10/2014 FINAL  Final  MRSA PCR Screening     Status: None   Collection Time: 11/06/14  3:19 AM  Result Value Ref Range Status   MRSA by PCR NEGATIVE NEGATIVE Final    Comment:        The GeneXpert MRSA Assay (FDA approved for NASAL specimens only), is one component of a comprehensive MRSA colonization surveillance program. It is not intended to diagnose MRSA infection nor to guide or monitor treatment for MRSA infections.     Today   Subjective   [name] today has no headache, no chest abdominal pain,no new weakness tingling or numbness, feels much better wants to go home today.     Objective   Blood pressure 115/69, pulse 85, temperature 98.7 F (37.1 C), temperature source Oral, resp. rate 16, height 5\' 2"  (1.575 m), weight 92.6 kg (204 lb 2.3 oz), SpO2 99 %.   Intake/Output Summary (Last 24 hours) at 11/11/14 1349 Last data filed at 11/11/14 1327  Gross per 24 hour  Intake    240 ml  Output      0 ml  Net    240 ml    Exam Awake Alert, Oriented x 3, No new F.N deficits, Normal  affect [place].AT,PERRAL Supple Neck,No JVD, No cervical lymphadenopathy appriciated.  Symmetrical Chest wall movement, Good air movement bilaterally, CTAB RRR,No Gallops,Rubs or new Murmurs, No Parasternal Heave +ve B.Sounds, Abd Soft, Non tender, No organomegaly appriciated, No rebound -guarding or rigidity. No Cyanosis, Clubbing or edema, No new Rash or bruise   Data Review   CBC w Diff: Lab Results  Component Value Date   WBC 3.0* 11/09/2014   HGB 8.8* 11/09/2014   HCT 26.7* 11/09/2014   PLT 181 11/09/2014   LYMPHOPCT 37 11/08/2014   MONOPCT 11 11/08/2014   EOSPCT 5 11/08/2014   BASOPCT 1 11/08/2014    CMP: Lab Results  Component Value Date   NA 141 11/09/2014   K 3.6 11/09/2014   CL 110 11/09/2014   CO2 25 11/09/2014   BUN 10 11/09/2014   CREATININE 0.82 11/09/2014   PROT 4.8* 11/09/2014   ALBUMIN 2.5* 11/09/2014   BILITOT 0.3 11/09/2014   ALKPHOS 35* 11/09/2014   AST 20 11/09/2014   ALT 12* 11/09/2014  Total Time in preparing paper work, data evaluation and todays exam - 35 minutes  Thurnell Lose M.D on 11/11/2014 at 1:49 PM  [place]  [phone]

## 2014-11-11 NOTE — Progress Notes (Signed)
Patient is discharged from room 5M20 at this time. Alert and in stable condition. IV site d/c'd. Instructions read to patient and husband and understanding verbalized. Left unit via wheelchair with all belongings at side.

## 2014-11-14 ENCOUNTER — Encounter: Payer: Self-pay | Admitting: Endocrinology

## 2014-11-14 ENCOUNTER — Ambulatory Visit (INDEPENDENT_AMBULATORY_CARE_PROVIDER_SITE_OTHER): Payer: 59 | Admitting: Endocrinology

## 2014-11-14 ENCOUNTER — Other Ambulatory Visit: Payer: Self-pay

## 2014-11-14 ENCOUNTER — Telehealth: Payer: Self-pay

## 2014-11-14 VITALS — BP 130/90 | HR 79 | Temp 98.6°F | Resp 14

## 2014-11-14 DIAGNOSIS — E038 Other specified hypothyroidism: Secondary | ICD-10-CM | POA: Diagnosis not present

## 2014-11-14 DIAGNOSIS — E2749 Other adrenocortical insufficiency: Secondary | ICD-10-CM

## 2014-11-14 LAB — BASIC METABOLIC PANEL
BUN: 15 mg/dL (ref 6–23)
CALCIUM: 10.1 mg/dL (ref 8.4–10.5)
CHLORIDE: 101 meq/L (ref 96–112)
CO2: 27 meq/L (ref 19–32)
Creatinine, Ser: 0.85 mg/dL (ref 0.40–1.20)
GFR: 73.22 mL/min (ref >60.00)
Glucose, Bld: 137 mg/dL — ABNORMAL HIGH (ref 70–99)
POTASSIUM: 4.2 meq/L (ref 3.5–5.1)
SODIUM: 138 meq/L (ref 135–145)

## 2014-11-14 LAB — T4, FREE: FREE T4: 0.54 ng/dL — AB (ref 0.60–1.60)

## 2014-11-14 MED ORDER — HYDROCORTISONE 20 MG PO TABS: ORAL_TABLET | ORAL | Status: DC

## 2014-11-14 NOTE — Progress Notes (Signed)
Patient ID: [name], female   DOB: [birth year], 57 y.o.   MRN: [id number]           Chief complaint: ?  Adrenal insufficiency   History of Present Illness:   The patient was diagnosed to have adrenal insufficiency when she was hospitalized on 11/05/14 with a syncopal episode and hypotension. This occurred fairly abruptly Apparently her blood pressure readings had been low normal in the office on the visit since July and September and she had been on antihypertensive drugs. However she previously had no symptoms of lightheadedness, new weakness, nausea, weight loss, decreased appetite or known electrolyte disturbance  Patient was evaluated with cortisol level which was low normal at 4.9 Subsequently Cortrosyn stimulation showed peak level of 17 ACTH level was 16 She was discharged on 20 mg prednisone and her antihypertensives were held  Currently she feels fairly good with no change in appetite, nausea, lightheadedness. 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 Brain cancer (HCC)     Frontal lobe, 1993 and 2005  . Migraines   . Morton neuroma   . Seizures (HCC)   . Fibromyalgia   . Right fibular fracture 07/12/2011  . 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 Anemia 03/06/2013  . Secondary Parkinson disease (HCC)   . Bladder spasm   . Hyperchloremia   . Hypertension   . GERD (gastroesophageal reflux disease)   . Stroke [facility])     Past Surgical History  Procedure Laterality Date  . Appendectomy  1976  . Excision morton's neuroma      right foot  . Brain cancer      1993 and 2005  . Orif ankle fracture  07/12/2011    Procedure: OPEN REDUCTION INTERNAL FIXATION (ORIF) ANKLE FRACTURE;  Surgeon: Eulas Post, MD;  Location: [facility] OR;  Service: Orthopedics;  Laterality: Right;  . Cholecystectomy  08/02/2013    Procedure: LAPAROSCOPIC CHOLECYSTECTOMY;  Surgeon: Cherylynn Ridges, MD;  Location: [facility] OR;  Service: General;;  . Esophagogastroduodenoscopy N/A 12/28/2013    Procedure: ESOPHAGOGASTRODUODENOSCOPY (EGD);  Surgeon: Louis Meckel, MD;  Location: [facility] ENDOSCOPY;  Service: Endoscopy;  Laterality: N/A;  . Savory dilation N/A 12/28/2013    Procedure: SAVORY DILATION;  Surgeon: Louis Meckel, MD;  Location: [facility] ENDOSCOPY;  Service: Endoscopy;  Laterality: N/A;    Family History  Problem Relation Age of Onset  . Diabetes Mother   . Diabetes Brother   . Hypertension Mother   . Hypertension Father     Social History:  reports that she has never smoked. She has never used smokeless tobacco. She reports that she does not drink alcohol or use illicit drugs.  Allergies:  Allergies  Allergen Reactions  . Versed [Midazolam] Other (See Comments) and Anaphylaxis    Severe respiratory depression, had to be masked in preop holding.  . Ciprofloxacin     REACTION: NAUSEA AND VOMITING  . Cyclobenzaprine Itching    Other reaction(s): Itching / Pruritis (ALLERGY/intolerance)  . Methocarbamol     REACTION: itching Patient currently tolerating it as its on her home med list.  . Metronidazole Other (See Comments)  Unknown   . Penicillins Rash    REACTION: RASH      Medication List       This list is accurate as of: 11/14/14  5:11 PM.  Always use your most recent med list.               aspirin 81 MG EC tablet  Take 1 tablet (81 mg total) by mouth daily.     aspirin-acetaminophen-caffeine 250-250-65 MG tablet  Commonly known as:  EXCEDRIN MIGRAINE  Take by mouth every 6 (six) hours as needed for headache.     CRESTOR 10 MG tablet  Generic drug:  rosuvastatin  Take 1 tablet by mouth   every Monday, Wednesday,   Friday     DULoxetine 60 MG capsule  Commonly known as:  CYMBALTA  TAKE 1  CAPSULE BY MOUTH DAILY     fenofibrate 160 MG tablet  Take 1 tablet (160 mg total) by mouth daily.     fesoterodine 8 MG Tb24 tablet  Commonly known as:  TOVIAZ  Take 8 mg by mouth daily.     hydrocortisone 20 MG tablet  Commonly known as:  CORTEF  Take 1 tablet in the morning and 1/2 tablet in the evening     KEPPRA 500 MG tablet  Generic drug:  levETIRAcetam  Take 1,500 mg by mouth 2 (two) times daily.     Linaclotide 145 MCG Caps capsule  Commonly known as:  LINZESS  Take 145 mcg by mouth daily as needed (constipation).     methocarbamol 500 MG tablet  Commonly known as:  ROBAXIN  Take 500 mg by mouth daily as needed for muscle spasms.     nitrofurantoin (macrocrystal-monohydrate) 100 MG capsule  Commonly known as:  MACROBID     nitrofurantoin 50 MG capsule  Commonly known as:  MACRODANTIN  Take 50 mg by mouth 2 (two) times daily. Maintenance     omeprazole 20 MG capsule  Commonly known as:  PRILOSEC  TAKE 1 CAPSULE BY MOUTH DAILY     predniSONE 20 MG tablet  Commonly known as:  DELTASONE  Take 1 tablet (20 mg total) by mouth daily with breakfast.     pregabalin 150 MG capsule  Commonly known as:  LYRICA  Take 1 capsule (150 mg total) by mouth 2 (two) times daily.     promethazine 25 MG tablet  Commonly known as:  PHENERGAN  Take 1 tablet (25 mg total) by mouth every 6 (six) hours as needed for nausea or vomiting.     TRIMETHOPRIM PO  Take 100 mg by mouth daily. When active UTI        LABS:  Admission on 11/05/2014, Discharged on 11/11/2014  No results displayed because visit has over 200 results.       REVIEW OF SYSTEMS:        Constitutional: no recent weight gain/loss.  She does have symptoms of fatigue for quite some time, relatively unchanged  Eyes: no history of blurred vision   ENT: no difficulty swallowing recently  Cardiovascular: no chest pain or tightness on exertion.  No leg swelling.   She has a long history of high blood pressure  for about 3-4 years, previously treated with losartan HCT  Respiratory: no cough/shortness of breath  Gastrointestinal: On treatment for constipation, no history of diarrhea, nausea or abdominal pain  Musculoskeletal: no muscle/joint aches  Skin: no rash  Neurological: no headaches. She does have weakness in her legs, slightly more  on the right secondary to her stroke Also has known Parkinson's disease with difficulty in ambulation with no tremor She does have some difficulty in remembering Has history of seizures, taking Keppra Is under the care of neurologist at [facility]  Psychiatric: no depression/anxiety  Endocrine: She does have tendency to cold intolerance and complains of fatigue.  No previous history of thyroid disease She had menopause around the age of [age] and had hot flushes for 4-5 years    Urological:  She does have overactive bladder on treatment.  Recently found to have UTI also    PHYSICAL EXAM:  BP 130/90 mmHg  Pulse 79  Temp(Src) 98.6 F (37 C)  Resp 14  Ht   Wt   SpO2 97%  Unable to check blood pressure standing up, patient cannot stand  GENERAL:  Averagely built and nourished, has mild generalized obesity  No pallor, clubbing, lymphadenopathy or edema.  Skin:  no rash or pigmentation.  EYES:  Externally normal.  Fundii:  normal discs  ENT: Oral mucosa and tongue normal.  No pigmentation of the mucosa  THYROID:  Not palpable.  HEART:  Normal  S1 and S2; no murmur or click.  CHEST:  Normal shape.  Lungs: Vescicular breath sounds heard equally.  No crepitations/ wheeze.  ABDOMEN:  No distention.  Liver and spleen not palpable.  No other mass or tenderness.  NEUROLOGICAL: .Reflexes are bilaterally very brisk at biceps and at ankles.  No tremor. Power in the proximal legs is 3+/5, relatively better on the left  JOINTS:  Normal.  ASSESSMENT:   Probable adrenal insufficiency. 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, this will need to be evaluated  HYPERTENSION: Blood pressure is high normal at home although significantly high in the office today, will need to continue follow-up and this may improve with reducing her high-dose steroid  PLAN:   Evaluation of pituitary function with free T4 levels, FSH and IGF-I today Continue steroid supplementation but switch to hydrocortisone which is more physiological The patient will be treated with 20 mg in the morning and 10 mg daily  However will give her a tapering dose for the next 5 days since she is on a large dose of 20 mg prednisone currently Discussed with patient and her husband need for using stress doses of 2-3 times normal if she has any acute illness or stress  If the patient does not have any evidence of hypopituitarism may consider retesting her adrenal function with 24 urine free cortisol on dexamethasone  Follow-up in 3 weeks  Patient Instructions  Stop Prednisone  On 11/9 start HYDROCORTISONE 20 mg tablets as follows with food: On the first 2 days take 2 tablets in the morning and 1 at about 5 PM On the third and fourth days take 1-1/2 tablet in the morning and half tablet in the evening On the fifth day  take one tablet in the morning and half tablet in the evening  If having an acute illness such as a cold or surgery increase the dose to twice daily  dose        Spencer Cardinal 11/14/2014, 5:11 PM

## 2014-11-14 NOTE — Patient Instructions (Addendum)
Stop Prednisone  On 11/9 start HYDROCORTISONE 20 mg tablets as follows with food: On the first 2 days take 2 tablets in the morning and 1 at about 5 PM On the third and fourth days take 1-1/2 tablet in the morning and half tablet in the evening On the fifth day take one tablet in the morning and half tablet in the evening  If having an acute illness such as a cold or surgery increase the dose to twice daily dose

## 2014-11-14 NOTE — Telephone Encounter (Signed)
Admission date: 11/05/2014 Discharge Date: 11/11/2014   Primary MD Annye Asa, MD  Recommendations for primary care physician for things to follow:  Monitor blood pressure and orthostatics, needs outpatient endocrine follow-up for adrenal insufficiency.  Reason for admission:  Stroke with cerebral ischemia----Primary problem: Hypotension   Information below provided by husband and patient (they were driving with phone on speaker phone).    Transition Care Management Follow-up Telephone Call  How have you been since you were released from the hospital?  Doing better.  No complaints.  Has returned back to baseline.  [place] the beach.  Saw Dr. Dwyane Dee today (11/14/14)---see office note.  Pt no longer hypotensive.  BP yesterday (11/13/14): 134/82.  BP today (11/14/14) during visit:  130/90.  Encouraged to monitor BPs while at the beach.  Pt/husband agreed.     Do you understand why you were in the hospital? yes   Do you understand the discharge instrcutions? yes  Items Reviewed:  Medications reviewed: yes  Allergies reviewed: yes  Dietary changes reviewed: yes, low sodium, heart healthy   Referrals reviewed: yes, endo- had appt today.     Functional Questionnaire:   Activities of Daily Living (ADLs):  Ambulates with assistive device.  Requires some assistance with ADLs.  Encouraged to increase activity slowly.   Any transportation issues/concerns?: no, husband provides transportation.     Any patient concerns? no   Confirmed importance and date/time of follow-up visits scheduled: yes   Confirmed with patient if condition begins to worsen call PCP or go to the ER: Yes.  [place].

## 2014-11-15 LAB — FOLLICLE STIMULATING HORMONE: FSH: 5.8 m[IU]/mL

## 2014-11-15 LAB — INSULIN-LIKE GROWTH FACTOR: Insulin-Like GF-1: 59 ng/mL (ref 46–172)

## 2014-11-15 NOTE — Progress Notes (Signed)
Quick Note:  Please let patient know that the thyroid level is low, start levothyroxine 50 g daily Looks like the pituitary gland function is being affected, continue hydrocortisone ______

## 2014-11-21 ENCOUNTER — Other Ambulatory Visit: Payer: Self-pay | Admitting: *Deleted

## 2014-11-21 MED ORDER — LEVOTHYROXINE SODIUM 50 MCG PO TABS: 50.0000 ug | ORAL_TABLET | Freq: Every day | ORAL | Status: DC

## 2014-11-22 ENCOUNTER — Encounter: Payer: Self-pay | Admitting: Family Medicine

## 2014-11-22 ENCOUNTER — Ambulatory Visit (INDEPENDENT_AMBULATORY_CARE_PROVIDER_SITE_OTHER): Payer: 59 | Admitting: Family Medicine

## 2014-11-22 VITALS — BP 132/90 | HR 82 | Temp 98.0°F | Resp 16 | Ht 62.0 in

## 2014-11-22 DIAGNOSIS — N3000 Acute cystitis without hematuria: Secondary | ICD-10-CM

## 2014-11-22 DIAGNOSIS — E038 Other specified hypothyroidism: Secondary | ICD-10-CM | POA: Diagnosis not present

## 2014-11-22 DIAGNOSIS — E2740 Unspecified adrenocortical insufficiency: Secondary | ICD-10-CM | POA: Diagnosis not present

## 2014-11-22 DIAGNOSIS — Z23 Encounter for immunization: Secondary | ICD-10-CM

## 2014-11-22 DIAGNOSIS — I9589 Other hypotension: Secondary | ICD-10-CM

## 2014-11-22 NOTE — Assessment & Plan Note (Signed)
New.  Now on hydrocortisone and following w/ Endo.  Will continue to follow.

## 2014-11-22 NOTE — Progress Notes (Signed)
   Subjective:    Patient ID: [name], female    DOB: Jun 20, 1955, 59 y.o.   MRN: [id number]  [facility] f/u- pt was admitted on 10/30 w/ altered mental status after possible syncopal event on the toilet.  Was found by EMS to have low BP and questionable R sided weakness.  Code stroke was called initially but cancelled as pt had overall decreased mental status and neuro findings were not focal.  Normal CT.  Found to have pyuria and bacteruria- started on abx.  Culture results were negative.  Pt has f/u w/ Dr Matilde Sprang on Friday.  Pt was found to have adrenal insufficiency.  Pt had f/u w/ Dr Dwyane Dee and is now on Cortef twice daily (no longer on Prednisone).  BP is now normal.  No CP, SOB, weakness, abd pain, N/V.  Pt is close to being able to transfer independently for the first time in months.  Husband reports pt is better than she has been in the last 18 months.   Review of Systems For ROS see HPI     Objective:   Physical Exam  Constitutional: She is oriented to person, place, and time. She appears well-developed and well-nourished. No distress.  Sitting in wheel chair  HENT:  Head: Normocephalic and atraumatic.  Eyes: Conjunctivae and EOM are normal. Pupils are equal, round, and reactive to light.  Neck: Normal range of motion. Neck supple. No thyromegaly present.  Cardiovascular: Normal rate, regular rhythm, normal heart sounds and intact distal pulses.   No murmur heard. Pulmonary/Chest: Effort normal and breath sounds normal. No respiratory distress.  Abdominal: Soft. She exhibits no distension. There is no tenderness.  Musculoskeletal: She exhibits no edema.  Lymphadenopathy:    She has no cervical adenopathy.  Neurological: She is alert and oriented to person, place, and time.  Skin: Skin is warm and dry.  Psychiatric: She has a normal mood and affect. Her behavior is normal.  Vitals reviewed.         Assessment & Plan:

## 2014-11-22 NOTE — Progress Notes (Signed)
Pre visit review using our clinic review tool, if applicable. No additional management support is needed unless otherwise documented below in the visit note. 

## 2014-11-22 NOTE — Assessment & Plan Note (Signed)
Pt saw Endo and her T4 is low despite normal TSH.  Pt is now on Levothyroxine 45mcg daily.  Abnormal thyroid function is thought to be secondary to adrenal insufficiency.  Pt to continue to see Endo.  Pt expressed understanding and is in agreement w/ plan.

## 2014-11-22 NOTE — Patient Instructions (Signed)
Follow up as scheduled for your physical No med changes at this time If you were to get a cold or otherwise get sick- please double your hydrocortisone dose Call with any questions or concerns Happy Holidays!!!

## 2014-11-22 NOTE — Assessment & Plan Note (Signed)
Chronic problem.  Pt was found to have low cortisol level consistent w/ adrenal insufficiency during her recent hospitalization.  Now on Hydrocortisone daily and her energy level and overall emotional state have dramatically improved.  Husband and pt are very pleased.  Reviewed need for stress dosing during times of physical illness.  Will continue to follow along and assist as able.

## 2014-11-22 NOTE — Assessment & Plan Note (Signed)
Pt completed course of abx for presumed UTI.  Her culture showed multiple bacteria but none predominant.  She has Urology f/u w/ Dr Matilde Sprang and she continues to take her daily macrobid prophylaxis.  Will continue to follow along.

## 2014-12-01 ENCOUNTER — Encounter: Payer: Self-pay | Admitting: Family Medicine

## 2014-12-04 ENCOUNTER — Encounter: Payer: Self-pay | Admitting: Family Medicine

## 2014-12-04 MED ORDER — PREGABALIN 150 MG PO CAPS: 150.0000 mg | ORAL_CAPSULE | Freq: Two times a day (BID) | ORAL | Status: DC

## 2014-12-04 MED ORDER — DULOXETINE HCL 60 MG PO CPEP: 60.0000 mg | ORAL_CAPSULE | Freq: Every day | ORAL | Status: DC

## 2014-12-04 NOTE — Telephone Encounter (Signed)
Medication filled to pharmacy as requested.   

## 2014-12-04 NOTE — Telephone Encounter (Signed)
Last OV 11-22-14 lyrica last filled 07/31/14 #60 with 3

## 2014-12-07 ENCOUNTER — Encounter: Payer: Self-pay | Admitting: Endocrinology

## 2014-12-07 ENCOUNTER — Other Ambulatory Visit (INDEPENDENT_AMBULATORY_CARE_PROVIDER_SITE_OTHER): Payer: 59

## 2014-12-07 ENCOUNTER — Ambulatory Visit (INDEPENDENT_AMBULATORY_CARE_PROVIDER_SITE_OTHER): Payer: 59 | Admitting: Endocrinology

## 2014-12-07 VITALS — BP 118/74 | HR 70 | Temp 98.4°F | Resp 14 | Ht 62.0 in

## 2014-12-07 DIAGNOSIS — E038 Other specified hypothyroidism: Secondary | ICD-10-CM

## 2014-12-07 DIAGNOSIS — E2749 Other adrenocortical insufficiency: Secondary | ICD-10-CM

## 2014-12-07 LAB — BASIC METABOLIC PANEL
BUN: 25 mg/dL — AB (ref 6–23)
CHLORIDE: 107 meq/L (ref 96–112)
CO2: 27 mEq/L (ref 19–32)
Calcium: 9 mg/dL (ref 8.4–10.5)
Creatinine, Ser: 0.88 mg/dL (ref 0.40–1.20)
GFR: 70.33 mL/min (ref >60.00)
GLUCOSE: 71 mg/dL (ref 70–99)
POTASSIUM: 4.3 meq/L (ref 3.5–5.1)
SODIUM: 142 meq/L (ref 135–145)

## 2014-12-07 LAB — T4, FREE: Free T4: 0.97 ng/dL (ref 0.60–1.60)

## 2014-12-07 NOTE — Progress Notes (Signed)
Patient ID: [name], female   DOB: [birth year], 59 y.o.   MRN: [id number]           Chief complaint:   Adrenal insufficiency and low thyroid  History of Present Illness:   Home BP 124/86, 128/85   The patient was diagnosed to have adrenal insufficiency when she was hospitalized on 11/05/14 with a syncopal episode and hypotension.However she previously had no symptoms of lightheadedness, new weakness, nausea, weight loss, decreased appetite or known electrolyte disturbance Patient was evaluated with cortisol level which was low normal at 4.9 and Cortrosyn stimulation showed peak level of 17 ACTH level was 16  After her initial consultation she was switched to hydrocortisone and the dose was reduced to the maintenance dose of 20 mg in the morning and 10 mg in the late afternoon which she is taking  Currently she feels fairly good with no fatigue or nausea. Husband thinks her appetite is better.  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 With starting levothyroxine 50 g daily she feels more energetic and alert.  No consistent cold intolerance  Labs:  Lab Results  Component Value Date   TSH 1.396 11/05/2014   TSH 1.15 12/08/2013   TSH 1.402 03/06/2013   FREET4 0.97 12/07/2014   FREET4 0.54* 11/14/2014   FREET4 0.59* 11/05/2014       Past Medical History  Diagnosis Date  . Brain cancer (HCC)     Frontal lobe, 1993 and 2005  . Migraines   . Morton neuroma   . Seizures (HCC)   . Fibromyalgia   . Right fibular fracture 07/12/2011  . 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 Anemia 03/06/2013  . Secondary Parkinson disease (HCC)   . Bladder spasm   . Hyperchloremia   . Hypertension   . GERD (gastroesophageal reflux disease)   . Stroke [facility])     Past Surgical History  Procedure Laterality Date  . Appendectomy  1976  . Excision morton's neuroma      right foot  . Brain cancer      1993 and 2005  . Orif ankle fracture  07/12/2011    Procedure: OPEN REDUCTION INTERNAL FIXATION (ORIF) ANKLE FRACTURE;  Surgeon: Eulas Post, MD;  Location: [facility] OR;  Service: Orthopedics;  Laterality: Right;  . Cholecystectomy  08/02/2013    Procedure: LAPAROSCOPIC CHOLECYSTECTOMY;  Surgeon: Cherylynn Ridges, MD;  Location: [facility] OR;  Service: General;;  . Esophagogastroduodenoscopy N/A 12/28/2013    Procedure: ESOPHAGOGASTRODUODENOSCOPY (EGD);  Surgeon: Louis Meckel, MD;  Location: [facility] ENDOSCOPY;  Service: Endoscopy;  Laterality: N/A;  . Savory dilation N/A 12/28/2013    Procedure: SAVORY DILATION;  Surgeon: Louis Meckel, MD;  Location: [facility] ENDOSCOPY;  Service: Endoscopy;  Laterality: N/A;    Family History  Problem Relation Age of Onset  . Diabetes Mother   . Diabetes Brother   . Hypertension Mother   . Hypertension Father     Social History:  reports that she has never smoked. She has never  used smokeless tobacco. She reports that she does not drink alcohol or use illicit drugs.  Allergies:  Allergies  Allergen Reactions  . Versed [Midazolam] Other (See Comments) and Anaphylaxis    Severe respiratory depression, had to be masked in preop holding.  . Ciprofloxacin     REACTION: NAUSEA AND VOMITING  . Cyclobenzaprine Itching    Other reaction(s): Itching / Pruritis (ALLERGY/intolerance)  . Methocarbamol     REACTION: itching Patient currently tolerating it as its on her home med list.  . Metronidazole Other (See Comments)    Unknown   . Penicillins Rash    REACTION: RASH      Medication List       This list is  accurate as of: 12/07/14  9:21 PM.  Always use your most recent med list.               aspirin 81 MG EC tablet  Take 1 tablet (81 mg total) by mouth daily.     aspirin-acetaminophen-caffeine 250-250-65 MG tablet  Commonly known as:  EXCEDRIN MIGRAINE  Take by mouth every 6 (six) hours as needed for headache.     CRESTOR 10 MG tablet  Generic drug:  rosuvastatin  Take 1 tablet by mouth   every Monday, Wednesday,   Friday     DULoxetine 60 MG capsule  Commonly known as:  CYMBALTA  Take 1 capsule (60 mg total) by mouth daily.     fenofibrate 160 MG tablet  Take 1 tablet (160 mg total) by mouth daily.     fesoterodine 8 MG Tb24 tablet  Commonly known as:  TOVIAZ  Take 8 mg by mouth daily.     hydrocortisone 20 MG tablet  Commonly known as:  CORTEF  Take 1 tablet in the morning and 1/2 tablet in the evening     KEPPRA 500 MG tablet  Generic drug:  levETIRAcetam  Take 1,500 mg by mouth 2 (two) times daily.     levothyroxine 50 MCG tablet  Commonly known as:  SYNTHROID, LEVOTHROID  Take 1 tablet (50 mcg total) by mouth daily.     Linaclotide 145 MCG Caps capsule  Commonly known as:  LINZESS  Take 145 mcg by mouth daily as needed (constipation).     methocarbamol 500 MG tablet  Commonly known as:  ROBAXIN  Take 500 mg by mouth daily as needed for muscle spasms.     nitrofurantoin 50 MG capsule  Commonly known as:  MACRODANTIN  Take 50 mg by mouth 2 (two) times daily. Maintenance     omeprazole 20 MG capsule  Commonly known as:  PRILOSEC  TAKE 1 CAPSULE BY MOUTH DAILY     pregabalin 150 MG capsule  Commonly known as:  LYRICA  Take 1 capsule (150 mg total) by mouth 2 (two) times daily.     promethazine 25 MG tablet  Commonly known as:  PHENERGAN  Take 1 tablet (25 mg total) by mouth every 6 (six) hours as needed for nausea or vomiting.     TRIMETHOPRIM PO  Take 100 mg by mouth daily. When active UTI        LABS:  Lab on 12/07/2014  Component Date Value  Ref Range Status  . Sodium 12/07/2014 142  135 - 145 mEq/L Final  . Potassium 12/07/2014 4.3  3.5 - 5.1 mEq/L Final  . Chloride 12/07/2014 107  96 - 112 mEq/L Final  . CO2 12/07/2014 27  19 - 32 mEq/L Final  .  Glucose, Bld 12/07/2014 71  70 - 99 mg/dL Final  . BUN 40/98/1191 25* 6 - 23 mg/dL Final  . Creatinine, Ser 12/07/2014 0.88  0.40 - 1.20 mg/dL Final  . Calcium 47/82/9562 9.0  8.4 - 10.5 mg/dL Final  . GFR 13/08/6576 70.33  >60.00 mL/min Final  Office Visit on 12/07/2014  Component Date Value Ref Range Status  . Free T4 12/07/2014 0.97  0.60 - 1.60 ng/dL Final     REVIEW OF SYSTEMS:          She has history of migraines and may have nausea with this but no nausea or otherwise  PHYSICAL EXAM:  BP 118/74 mmHg  Pulse 70  Temp(Src) 98.4 F (36.9 C)  Resp 14  Ht 5\' 2"  (1.575 m)  Wt   SpO2 98%  Unable to check blood pressure standing up, patient cannot stand She appears more alert Biceps reflexes appear normal  ASSESSMENT:   Secondary adrenal insufficiency. She is subjectively doing very well with hydrocortisone supplementation with overall less weakness, better appetite and no further problems with hypotension Electrolytes are back to normal  She also is subjectively doing better with more alertness and energy level with levothyroxine supplementation for her SECONDARY HYPOTHYROIDISM   PLAN:   Recheck electrolytes and free T4 today Periodically monitor her IGF-1 Discussed stress doses of hydrocortisone   Anna Beaird 12/07/2014, 9:21 PM    Addendum: Free T4 normal, continue same dose of levothyroxine Sodium normal

## 2014-12-08 NOTE — Progress Notes (Signed)
Quick Note:  Please let patient know that the thyroid result is normal and no further action needed ______ 

## 2014-12-12 ENCOUNTER — Telehealth: Payer: Self-pay | Admitting: Behavioral Health

## 2014-12-12 NOTE — Telephone Encounter (Signed)
Unable to reach patient at time of Pre-Visit Call.  Left message for patient to return call when available.    

## 2014-12-13 ENCOUNTER — Ambulatory Visit (INDEPENDENT_AMBULATORY_CARE_PROVIDER_SITE_OTHER): Payer: 59 | Admitting: Family Medicine

## 2014-12-13 ENCOUNTER — Encounter: Payer: Self-pay | Admitting: Family Medicine

## 2014-12-13 VITALS — BP 120/82 | HR 87 | Temp 98.0°F | Resp 16 | Ht 62.0 in

## 2014-12-13 DIAGNOSIS — Z0000 Encounter for general adult medical examination without abnormal findings: Secondary | ICD-10-CM | POA: Diagnosis not present

## 2014-12-13 DIAGNOSIS — Z780 Asymptomatic menopausal state: Secondary | ICD-10-CM

## 2014-12-13 DIAGNOSIS — Z1231 Encounter for screening mammogram for malignant neoplasm of breast: Secondary | ICD-10-CM

## 2014-12-13 NOTE — Assessment & Plan Note (Signed)
Pt's PE is unchanged from her previous but her coloring, energy, mood have all improved.  Overdue on pap- pt was not mentally prepared to have this done today but will schedule a pap at her convenience.  Due for mammo, DEXA- order entered.  UTD on labs- reviewed all recent labs.  UTD on flu shot.  Anticipatory guidance provided.

## 2014-12-13 NOTE — Progress Notes (Signed)
Pre visit review using our clinic review tool, if applicable. No additional management support is needed unless otherwise documented below in the visit note. 

## 2014-12-13 NOTE — Patient Instructions (Signed)
Schedule a pap at your convenience I reviewed all of your recent labs- they look great! We'll call you with your mammogram and bone density appts You are up to date on pap until 2022 Call with any questions or concerns Happy Holidays!!!

## 2014-12-13 NOTE — Progress Notes (Signed)
   Subjective:    Patient ID: [name], female    DOB: 07/31/1955, 59 y.o.   MRN: [id number]  HPI CPE- Due for pap (last done 2012- due 2022), mammo, DEXA.  UTD on colonoscopy.   Review of Systems Patient reports no vision/ hearing changes, adenopathy,fever, weight change,  persistant/recurrent hoarseness , swallowing issues, chest pain, palpitations, edema, persistant/recurrent cough, hemoptysis, dyspnea (rest/exertional/paroxysmal nocturnal), gastrointestinal bleeding (melena, rectal bleeding), abdominal pain, significant heartburn, bowel changes, GU symptoms (dysuria, hematuria, incontinence), Gyn symptoms (abnormal  bleeding, pain),  syncope, focal weakness, memory loss, skin/hair/nail changes, abnormal bruising or bleeding, anxiety, or depression.   + bilateral lower leg numbness    Objective:   Physical Exam General Appearance:    Alert, cooperative, no distress, appears stated age, obese  Head:    Normocephalic, without obvious abnormality, atraumatic  Eyes:    PERRL, conjunctiva/corneas clear, EOM's intact, fundi    benign, both eyes  Ears:    Normal TM's and external ear canals, both ears  Nose:   Nares normal, septum midline, mucosa normal, no drainage    or sinus tenderness  Throat:   Lips, mucosa, and tongue normal; teeth and gums normal  Neck:   Supple, symmetrical, trachea midline, no adenopathy;    Thyroid: no enlargement/tenderness/nodules  Back:     Symmetric, no curvature, ROM normal, no CVA tenderness  Lungs:     Clear to auscultation bilaterally, respirations unlabored  Chest Wall:    No tenderness or deformity   Heart:    Regular rate and rhythm, S1 and S2 normal, no murmur, rub   or gallop  Breast Exam:    Deferred to mammo  Abdomen:     Soft, non-tender, bowel sounds active all four quadrants,    no masses, no organomegaly  Genitalia:    Deferred to pap appt  Rectal:    Extremities:   Extremities normal, atraumatic, no cyanosis or edema  Pulses:   2+ and  symmetric all extremities  Skin:   Skin color, texture, turgor normal, no rashes or lesions  Lymph nodes:   Cervical, supraclavicular, and axillary nodes normal  Neurologic:   CNII-XII intact, sensation and reflexes throughout, sitting in wheelchair          Assessment & Plan:

## 2014-12-14 ENCOUNTER — Encounter: Payer: Self-pay | Admitting: Family Medicine

## 2014-12-14 MED ORDER — PROMETHAZINE HCL 25 MG PO TABS: 25.0000 mg | ORAL_TABLET | Freq: Four times a day (QID) | ORAL | Status: DC | PRN

## 2014-12-14 NOTE — Telephone Encounter (Signed)
Medication filled to pharmacy as requested.   

## 2014-12-25 ENCOUNTER — Encounter: Payer: Self-pay | Admitting: Family Medicine

## 2015-01-23 ENCOUNTER — Ambulatory Visit: Payer: 59 | Admitting: Family Medicine

## 2015-01-23 ENCOUNTER — Telehealth: Payer: Self-pay | Admitting: Family Medicine

## 2015-01-23 NOTE — Telephone Encounter (Signed)
No charge. 

## 2015-01-23 NOTE — Telephone Encounter (Signed)
This is tabori patient

## 2015-01-23 NOTE — Telephone Encounter (Signed)
Patient canceled 3pm appointment due to experiencing migraines will call back to schedule appointment. Charge or no charge

## 2015-01-23 NOTE — Telephone Encounter (Signed)
I believe she switched to Dr Etter Sjogren due to my move.  I suspect her appt is w/ Dr Etter Sjogren since I'm not here this afternoon.

## 2015-02-05 ENCOUNTER — Other Ambulatory Visit: Payer: Self-pay | Admitting: *Deleted

## 2015-02-05 MED ORDER — HYDROCORTISONE 20 MG PO TABS: ORAL_TABLET | ORAL | Status: DC

## 2015-02-07 ENCOUNTER — Other Ambulatory Visit: Payer: Self-pay | Admitting: Family Medicine

## 2015-02-07 DIAGNOSIS — Z1231 Encounter for screening mammogram for malignant neoplasm of breast: Secondary | ICD-10-CM

## 2015-03-02 ENCOUNTER — Other Ambulatory Visit (INDEPENDENT_AMBULATORY_CARE_PROVIDER_SITE_OTHER): Payer: 59

## 2015-03-02 ENCOUNTER — Other Ambulatory Visit: Payer: Self-pay | Admitting: *Deleted

## 2015-03-02 DIAGNOSIS — E038 Other specified hypothyroidism: Secondary | ICD-10-CM | POA: Diagnosis not present

## 2015-03-02 DIAGNOSIS — E2749 Other adrenocortical insufficiency: Secondary | ICD-10-CM

## 2015-03-02 LAB — BASIC METABOLIC PANEL
BUN: 23 mg/dL (ref 6–23)
CO2: 31 meq/L (ref 19–32)
Calcium: 9.4 mg/dL (ref 8.4–10.5)
Chloride: 105 mEq/L (ref 96–112)
Creatinine, Ser: 0.78 mg/dL (ref 0.40–1.20)
GFR: 80.77 mL/min (ref >60.00)
GLUCOSE: 82 mg/dL (ref 70–99)
POTASSIUM: 4.2 meq/L (ref 3.5–5.1)
SODIUM: 141 meq/L (ref 135–145)

## 2015-03-02 LAB — T4, FREE: Free T4: 0.84 ng/dL (ref 0.60–1.60)

## 2015-03-02 MED ORDER — LEVOTHYROXINE SODIUM 50 MCG PO TABS
50.0000 ug | ORAL_TABLET | Freq: Every day | ORAL | Status: DC
Start: 2015-03-02 — End: 2015-03-05

## 2015-03-05 ENCOUNTER — Telehealth: Payer: Self-pay | Admitting: Endocrinology

## 2015-03-05 ENCOUNTER — Ambulatory Visit (INDEPENDENT_AMBULATORY_CARE_PROVIDER_SITE_OTHER): Payer: 59 | Admitting: Endocrinology

## 2015-03-05 ENCOUNTER — Encounter: Payer: Self-pay | Admitting: Endocrinology

## 2015-03-05 ENCOUNTER — Other Ambulatory Visit: Payer: Self-pay | Admitting: *Deleted

## 2015-03-05 VITALS — HR 92 | Temp 97.8°F | Resp 16 | Ht 62.0 in | Wt 197.0 lb

## 2015-03-05 DIAGNOSIS — E230 Hypopituitarism: Secondary | ICD-10-CM | POA: Diagnosis not present

## 2015-03-05 MED ORDER — LEVOTHYROXINE SODIUM 125 MCG PO TABS: ORAL_TABLET | ORAL | Status: DC

## 2015-03-05 NOTE — Telephone Encounter (Signed)
rx sent

## 2015-03-05 NOTE — Telephone Encounter (Signed)
Pt husband called to see if we could call in the new rx of the levothyroxine, saw that on Friday 50 ug was called in and this is all the note from today mentions, what are we supposed to call in for this pt.

## 2015-03-05 NOTE — Patient Instructions (Signed)
Thyroid pill new rx   Call if BP high

## 2015-03-05 NOTE — Telephone Encounter (Signed)
125ug, 1/2 daily

## 2015-03-05 NOTE — Progress Notes (Signed)
Patient ID: [name], female   DOB: 1955/11/12, 60 y.o.   MRN: [id number]           Chief complaint:   Follow-up of Adrenal insufficiency and hypopituitarism  History of Present Illness:    The patient was diagnosed to have adrenal insufficiency when she was hospitalized on 11/05/14 with a syncopal episode and hypotension. However she previously had no symptoms of lightheadedness, new weakness, nausea, weight loss, decreased appetite or known electrolyte disturbance Patient was evaluated with cortisol level which was low normal at 4.9 and Cortrosyn stimulation showed peak level of 17 ACTH level was 16  After her initial consultation she was switched to hydrocortisone and the dose was reduced to the maintenance dose of 20 mg in the morning and 10 mg in the late afternoon She is consistent with the medication with the help of her husband  Again she feels fairly good with no fatigue or nausea. Husband thinks her appetite is quite good. 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 With starting levothyroxine 50 g daily she  had felt significantly more energetic, alert and able to walk better Recently however she has not been as alert or active  tends to have dry skin  No consistent cold  intolerance  Wt Readings from Last 3 Encounters:  03/05/15 197 lb (89.359 kg)  11/07/14 204 lb 2.3 oz (92.6 kg)  12/27/13 210 lb (95.255 kg)    Labs:  Lab Results  Component Value Date   TSH 1.396 11/05/2014   TSH 1.15 12/08/2013   TSH 1.402 03/06/2013   FREET4 0.84 03/02/2015   FREET4 0.97 12/07/2014   FREET4 0.54* 11/14/2014       Past Medical History  Diagnosis Date  . Brain cancer (HCC)     Frontal lobe, 1993 and 2005  . Migraines   . Morton neuroma   . Seizures (HCC)   . Fibromyalgia   . Right fibular fracture 07/12/2011  . 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 Anemia 03/06/2013  . Secondary Parkinson disease (HCC)   . Bladder spasm   . Hyperchloremia   . Hypertension   . GERD (gastroesophageal reflux disease)   . Stroke [facility])     Past Surgical History  Procedure Laterality Date  . Appendectomy  1976  . Excision morton's neuroma      right foot  . Brain cancer      1993 and 2005  . Orif ankle fracture  07/12/2011    Procedure: OPEN REDUCTION INTERNAL FIXATION (ORIF) ANKLE FRACTURE;  Surgeon: Eulas Post, MD;  Location: [facility] OR;  Service: Orthopedics;  Laterality: Right;  .  Cholecystectomy  08/02/2013    Procedure: LAPAROSCOPIC CHOLECYSTECTOMY;  Surgeon: Cherylynn Ridges, MD;  Location: [facility] OR;  Service: General;;  . Esophagogastroduodenoscopy N/A 12/28/2013    Procedure: ESOPHAGOGASTRODUODENOSCOPY (EGD);  Surgeon: Louis Meckel, MD;  Location: [facility] ENDOSCOPY;  Service: Endoscopy;  Laterality: N/A;  . Savory dilation N/A 12/28/2013    Procedure: SAVORY DILATION;  Surgeon: Louis Meckel, MD;  Location: [facility] ENDOSCOPY;  Service: Endoscopy;  Laterality: N/A;    Family History  Problem Relation Age of Onset  . Diabetes Mother   . Diabetes Brother   . Hypertension Mother   . Hypertension Father     Social History:  reports that she has never smoked. She has never used smokeless  tobacco. She reports that she does not drink alcohol or use illicit drugs.  Allergies:  Allergies  Allergen Reactions  . Versed [Midazolam] Other (See Comments) and Anaphylaxis    Severe respiratory depression, had to be masked in preop holding.  . Ciprofloxacin     REACTION: NAUSEA AND VOMITING  . Cyclobenzaprine Itching    Other reaction(s): Itching / Pruritis (ALLERGY/intolerance)  . Methocarbamol     REACTION: itching Patient currently tolerating it as its on her home med list.  . Metronidazole Other (See Comments)    Unknown   . Penicillins Rash    REACTION: RASH      Medication List       This list is accurate as of: 03/05/15  7:01 PM.  Always use your most recent med list.               aspirin 81 MG EC tablet  Take 1 tablet (81 mg total) by mouth daily.     aspirin-acetaminophen-caffeine 250-250-65 MG tablet  Commonly known as:  EXCEDRIN MIGRAINE  Take by mouth every 6 (six) hours as needed for headache.     CRESTOR 10 MG tablet  Generic drug:  rosuvastatin  Take 1 tablet by mouth   every Monday, Wednesday,   Friday     DULoxetine 60 MG capsule  Commonly known as:  CYMBALTA  Take 1 capsule (60 mg total) by mouth daily.     fenofibrate 160 MG tablet  Take 1 tablet (160 mg total) by mouth daily.     fesoterodine 8 MG Tb24 tablet  Commonly known as:  TOVIAZ  Take 8 mg by mouth daily.     hydrocortisone 20 MG tablet  Commonly known as:  CORTEF  Take 1 tablet in the morning and 1/2 tablet in the evening     KEPPRA 500 MG tablet  Generic drug:  levETIRAcetam  Take 1,500 mg by mouth 2 (two) times daily.     levothyroxine 125 MCG tablet  Commonly known as:  SYNTHROID, LEVOTHROID  Take 1/2 tablet daily     Linaclotide 145 MCG Caps capsule  Commonly known as:  LINZESS  Take 145 mcg by mouth daily as needed (constipation).     methocarbamol 500 MG tablet  Commonly known as:  ROBAXIN  Take 500 mg by mouth daily as needed for muscle spasms.      nitrofurantoin 50 MG capsule  Commonly known as:  MACRODANTIN  Take 50 mg by mouth 2 (two) times daily. Maintenance     omeprazole 20 MG capsule  Commonly known as:  PRILOSEC  TAKE 1 CAPSULE BY MOUTH DAILY     pregabalin 150 MG capsule  Commonly known as:  LYRICA  Take 1 capsule (150 mg total) by  mouth 2 (two) times daily.     promethazine 25 MG tablet  Commonly known as:  PHENERGAN  Take 1 tablet (25 mg total) by mouth every 6 (six) hours as needed for nausea or vomiting.     TRIMETHOPRIM PO  Take 100 mg by mouth daily. When active UTI        LABS:  Lab on 03/02/2015  Component Date Value Ref Range Status  . Sodium 03/02/2015 141  135 - 145 mEq/L Final  . Potassium 03/02/2015 4.2  3.5 - 5.1 mEq/L Final  . Chloride 03/02/2015 105  96 - 112 mEq/L Final  . CO2 03/02/2015 31  19 - 32 mEq/L Final  . Glucose, Bld 03/02/2015 82  70 - 99 mg/dL Final  . BUN 78/29/5621 23  6 - 23 mg/dL Final  . Creatinine, Ser 03/02/2015 0.78  0.40 - 1.20 mg/dL Final  . Calcium 30/86/5784 9.4  8.4 - 10.5 mg/dL Final  . GFR 69/62/9528 80.77  >60.00 mL/min Final  . Free T4 03/02/2015 0.84  0.60 - 1.60 ng/dL Final     REVIEW OF SYSTEMS:         She is on multiple medications as in the current list and also has multiple problems    PHYSICAL EXAM:  Pulse 92  Temp(Src) 97.8 F (36.6 C)  Resp 16  Ht 5\' 2"  (1.575 m)  Wt 197 lb (89.359 kg)  BMI 36.02 kg/m2  SpO2 95%  Blood pressure difficult to auscultate systolic reading about 128 and diastolic around 90 both arms checked with regular cough  Unable to check blood pressure standing up, patient cannot stand She is conversing normally and answering questions Does not look which occurred No supraclavicular or cervical fat pads present Skin appears dry  biceps reflexes are normal  ASSESSMENT:   HYPOPITUITARISM with adrenal, thyroid and gonadotropin  deficiencies  Secondary adrenal insufficiency:. She is subjectively doing very well with  hydrocortisone supplementation with  less weakness, better appetite and no further problems with hypotension  Electrolytes are normal No evidence of Cushing's on exam  SECONDARY HYPOTHYROIDISM  She has more alertness and ability to move better However has been things that she is slowing down a little since her initial improvement Also her free T4 is relatively lower than her last visit  ?  Hypertension: Her blood pressure is higher in the office although her husband reports normal readings consistently at home  PLAN:   Continue same dose of hydrocortisone for now Increase levothyroxine to 62.5 ug Needs to have blood pressure monitored regularly  Follow-up in 3 months again with electrolytes, thyroid levels and IGF-1 levels   Zailynn Brandel 03/05/2015, 7:01 PM

## 2015-03-06 ENCOUNTER — Ambulatory Visit
Admission: RE | Admit: 2015-03-06 | Discharge: 2015-03-06 | Disposition: A | Discharge status: Home / Self Care | Payer: 59 | Source: Ambulatory Visit | Attending: Family Medicine | Admitting: Family Medicine

## 2015-03-06 DIAGNOSIS — Z1231 Encounter for screening mammogram for malignant neoplasm of breast: Secondary | ICD-10-CM

## 2015-03-06 DIAGNOSIS — Z780 Asymptomatic menopausal state: Secondary | ICD-10-CM

## 2015-03-07 ENCOUNTER — Ambulatory Visit: Payer: 59 | Admitting: Endocrinology

## 2015-03-07 ENCOUNTER — Encounter: Payer: Self-pay | Admitting: General Practice

## 2015-03-26 ENCOUNTER — Telehealth: Payer: Self-pay | Admitting: General Practice

## 2015-03-26 NOTE — Telephone Encounter (Signed)
Last OV 12/13/14 lyrica last filled 12/04/14 #60 with 3

## 2015-03-27 MED ORDER — PREGABALIN 150 MG PO CAPS: 150.0000 mg | ORAL_CAPSULE | Freq: Two times a day (BID) | ORAL | Status: DC

## 2015-03-27 NOTE — Telephone Encounter (Signed)
Medication filled to pharmacy as requested.   

## 2015-03-27 NOTE — Telephone Encounter (Signed)
Ok to refill same instructions and quantity with 2 additional refills.

## 2015-03-28 ENCOUNTER — Other Ambulatory Visit: Payer: Self-pay | Admitting: Endocrinology

## 2015-04-02 ENCOUNTER — Telehealth: Payer: Self-pay | Admitting: Family Medicine

## 2015-04-02 NOTE — Telephone Encounter (Signed)
Ok to reprint prior note and add to the last sentence 'no one else locally to provide her care and she is not able to be home alone'

## 2015-04-02 NOTE — Telephone Encounter (Signed)
Spoke with pt husband, he advised that previous letter only helped him get out of last summons. Pt husband was summoned again for a jury in May. He would like to know if there is a way to go into more detail about the care he provides his wife and how he cannot leave her alone at the home.   Also pt is going to remain your patient as the distance is the same from their home to [place] as it was to [place].

## 2015-04-02 NOTE — Telephone Encounter (Signed)
Letter typed and mailed to pt °

## 2015-04-02 NOTE — Telephone Encounter (Signed)
Husband states that he has been called to jury duty again and is asking that [name] give him a call. He is needing a more detailed note for court.

## 2015-04-06 ENCOUNTER — Other Ambulatory Visit: Payer: Self-pay | Admitting: Family Medicine

## 2015-04-06 ENCOUNTER — Encounter: Payer: Self-pay | Admitting: Family Medicine

## 2015-04-06 MED ORDER — OMEPRAZOLE 20 MG PO CPDR: 20.0000 mg | DELAYED_RELEASE_CAPSULE | Freq: Every day | ORAL | Status: DC

## 2015-04-19 ENCOUNTER — Telehealth: Payer: Self-pay | Admitting: Family Medicine

## 2015-04-19 DIAGNOSIS — Z01419 Encounter for gynecological examination (general) (routine) without abnormal findings: Secondary | ICD-10-CM

## 2015-04-19 NOTE — Telephone Encounter (Signed)
Husband states that pt is asking for a referral to OB GYN to have a pap done.

## 2015-04-23 NOTE — Telephone Encounter (Signed)
Referral placed and pt husband advised.

## 2015-04-23 NOTE — Telephone Encounter (Signed)
Ok for referral?

## 2015-04-24 ENCOUNTER — Encounter: Payer: Self-pay | Admitting: Family Medicine

## 2015-05-07 ENCOUNTER — Encounter: Payer: Self-pay | Admitting: Family Medicine

## 2015-05-08 MED ORDER — PROMETHAZINE HCL 25 MG PO TABS
25.0000 mg | ORAL_TABLET | Freq: Four times a day (QID) | ORAL | Status: DC | PRN
Start: 2015-05-08 — End: 2015-10-19

## 2015-05-08 NOTE — Telephone Encounter (Signed)
Medication filled to pharmacy as requested.   

## 2015-05-22 ENCOUNTER — Encounter: Payer: Self-pay | Admitting: Family Medicine

## 2015-05-22 ENCOUNTER — Other Ambulatory Visit: Payer: Self-pay | Admitting: Family Medicine

## 2015-05-22 NOTE — Telephone Encounter (Signed)
Medication filled to pharmacy as requested.   

## 2015-05-23 LAB — HM PAP SMEAR

## 2015-05-28 ENCOUNTER — Other Ambulatory Visit (INDEPENDENT_AMBULATORY_CARE_PROVIDER_SITE_OTHER): Payer: 59

## 2015-05-28 DIAGNOSIS — E230 Hypopituitarism: Secondary | ICD-10-CM

## 2015-05-28 LAB — BASIC METABOLIC PANEL
BUN: 20 mg/dL (ref 6–23)
CALCIUM: 9.6 mg/dL (ref 8.4–10.5)
CO2: 32 meq/L (ref 19–32)
CREATININE: 0.82 mg/dL (ref 0.40–1.20)
Chloride: 105 mEq/L (ref 96–112)
GFR: 76.18 mL/min (ref >60.00)
GLUCOSE: 82 mg/dL (ref 70–99)
Potassium: 3.5 mEq/L (ref 3.5–5.1)
Sodium: 143 mEq/L (ref 135–145)

## 2015-05-28 LAB — TSH: TSH: 0.34 u[IU]/mL — AB (ref 0.35–4.50)

## 2015-05-28 LAB — T4, FREE: Free T4: 0.84 ng/dL (ref 0.60–1.60)

## 2015-05-29 LAB — INSULIN-LIKE GROWTH FACTOR: Insulin-Like GF-1: 51 ng/mL (ref 46–172)

## 2015-05-31 ENCOUNTER — Ambulatory Visit (INDEPENDENT_AMBULATORY_CARE_PROVIDER_SITE_OTHER): Payer: 59 | Admitting: Endocrinology

## 2015-05-31 ENCOUNTER — Encounter: Payer: Self-pay | Admitting: Endocrinology

## 2015-05-31 VITALS — BP 108/62 | HR 96 | Temp 98.0°F | Resp 16 | Ht 62.0 in | Wt 214.8 lb

## 2015-05-31 DIAGNOSIS — E230 Hypopituitarism: Secondary | ICD-10-CM

## 2015-05-31 NOTE — Patient Instructions (Signed)
Hydrocortisone 1/2 twice daily  Check BP weekly

## 2015-05-31 NOTE — Progress Notes (Signed)
Patient ID: [name], female   DOB: 1955-05-09, 60 y.o.   MRN: [id number]           Chief complaint:   Follow-up of Adrenal insufficiency and hypopituitarism  History of Present Illness:    The patient was diagnosed to have adrenal insufficiency when she was hospitalized on 11/05/14 with a syncopal episode and hypotension. 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 With starting levothyroxine 50 g daily she  had felt significantly more energetic, alert and able to walk better Although on her last visit she was feeling less alert she is doing better now with increasing her dose up to  62.5 mg daily  Wt Readings from Last 3 Encounters:  05/31/15 214 lb 12.8 oz (97.433 kg)  03/05/15 197 lb (89.359 kg)  11/07/14 204 lb 2.3 oz (92.6 kg)    Labs:  Lab Results  Component Value Date   TSH 0.34* 05/28/2015   TSH 1.396 11/05/2014   TSH 1.15 12/08/2013   FREET4 0.84 05/28/2015   FREET4 0.84 03/02/2015   FREET4 0.97 12/07/2014       Past Medical History  Diagnosis Date  . Brain cancer (HCC)     Frontal lobe, 1993 and 2005  . Migraines   . Morton neuroma   . Seizures (HCC)   . Fibromyalgia   . Right fibular fracture 07/12/2011  . 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 Anemia 03/06/2013  . Secondary Parkinson disease (HCC)   . Bladder spasm   . Hyperchloremia   . Hypertension   . GERD (gastroesophageal reflux disease)   . Stroke [facility])     Past Surgical History  Procedure Laterality Date  . Appendectomy  1976  . Excision morton's neuroma      right foot  . Brain cancer      1993 and 2005  . Orif ankle fracture  07/12/2011    Procedure: OPEN REDUCTION INTERNAL FIXATION (ORIF) ANKLE FRACTURE;  Surgeon: Eulas Post, MD;  Location: [facility] OR;  Service:  Orthopedics;  Laterality: Right;  . Cholecystectomy  08/02/2013    Procedure: LAPAROSCOPIC CHOLECYSTECTOMY;  Surgeon: Cherylynn Ridges, MD;  Location: [facility] OR;  Service: General;;  . Esophagogastroduodenoscopy N/A 12/28/2013    Procedure: ESOPHAGOGASTRODUODENOSCOPY (EGD);  Surgeon: Louis Meckel, MD;  Location: [facility] ENDOSCOPY;  Service: Endoscopy;  Laterality: N/A;  . Savory dilation N/A 12/28/2013    Procedure: SAVORY DILATION;  Surgeon: Louis Meckel, MD;  Location: [facility] ENDOSCOPY;  Service: Endoscopy;  Laterality: N/A;    Family History  Problem Relation Age of Onset  . Diabetes Mother   . Diabetes Brother   . Hypertension Mother   . Hypertension Father     Social History:  reports that she has never smoked. She has never used  smokeless tobacco. She reports that she does not drink alcohol or use illicit drugs.  Allergies:  Allergies  Allergen Reactions  . Versed [Midazolam] Other (See Comments) and Anaphylaxis    Severe respiratory depression, had to be masked in preop holding.  . Ciprofloxacin     REACTION: NAUSEA AND VOMITING  . Cyclobenzaprine Itching    Other reaction(s): Itching / Pruritis (ALLERGY/intolerance)  . Methocarbamol     REACTION: itching Patient currently tolerating it as its on her home med list.  . Metronidazole Other (See Comments)    Unknown   . Penicillins Rash    REACTION: RASH      Medication List       This list is accurate as of: 05/31/15 11:59 PM.  Always use your most recent med list.               aspirin 81 MG EC tablet  Take 1 tablet (81 mg total) by mouth daily.     aspirin-acetaminophen-caffeine 250-250-65 MG tablet  Commonly known as:  EXCEDRIN MIGRAINE  Take by mouth every 6 (six) hours as needed for headache.     DULoxetine 60 MG capsule  Commonly known as:  CYMBALTA  Take 1 capsule (60 mg total) by mouth daily.     fenofibrate 160 MG tablet  Take 1 tablet (160 mg total) by mouth daily.     fesoterodine 8 MG Tb24 tablet  Commonly known as:  TOVIAZ  Take 8 mg by mouth daily.     hydrocortisone 20 MG tablet  Commonly known as:  CORTEF  TAKE 1 TABLET BY MOUTH IN THE MORNING, AND TAKE 1/2 TABLET BY MOUTH DAILY IN THE EVENING     KEPPRA 500 MG tablet  Generic drug:  levETIRAcetam  Take 1,500 mg by mouth 2 (two) times daily.     levothyroxine 125 MCG tablet  Commonly known as:  SYNTHROID, LEVOTHROID  Take 1/2 tablet daily     linaclotide 145 MCG Caps capsule  Commonly known as:  LINZESS  Take 145 mcg by mouth daily as needed (constipation).     methocarbamol 500 MG tablet  Commonly known as:  ROBAXIN  Take 500 mg by mouth daily as needed for muscle spasms.     nitrofurantoin 50 MG capsule  Commonly known as:  MACRODANTIN  Take 50 mg by mouth  2 (two) times daily. Maintenance     omeprazole 20 MG capsule  Commonly known as:  PRILOSEC  Take 1 capsule (20 mg total) by mouth daily.     pregabalin 150 MG capsule  Commonly known as:  LYRICA  Take 1 capsule (150 mg total) by mouth 2 (two) times daily.     promethazine 25 MG tablet  Commonly known as:  PHENERGAN  Take 1 tablet (25 mg total) by mouth every 6 (six) hours as needed for nausea or vomiting.     rosuvastatin 10 MG tablet  Commonly known as:  CRESTOR  TAKE 1 TABLET BY MOUTH  EVERY MONDAY, WEDNESDAY,  FRIDAY     TRIMETHOPRIM PO  Take 100 mg by mouth daily. When active UTI        LABS:  Lab on 05/28/2015  Component Date Value Ref Range Status  . Free T4 05/28/2015 0.84  0.60 - 1.60 ng/dL Final  . TSH 95/62/1308 0.34* 0.35 - 4.50 uIU/mL Final  . Sodium 05/28/2015 143  135 - 145 mEq/L Final  . Potassium 05/28/2015 3.5  3.5 - 5.1 mEq/L Final  . Chloride 05/28/2015 105  96 - 112 mEq/L Final  . CO2 05/28/2015 32  19 - 32 mEq/L Final  . Glucose, Bld 05/28/2015 82  70 - 99 mg/dL Final  . BUN 65/78/4696 20  6 - 23 mg/dL Final  . Creatinine, Ser 05/28/2015 0.82  0.40 - 1.20 mg/dL Final  . Calcium 29/52/8413 9.6  8.4 - 10.5 mg/dL Final  . GFR 24/40/1027 76.18  >60.00 mL/min Final  . Insulin-Like GF-1 05/28/2015 51  46 - 172 ng/mL Final     REVIEW OF SYSTEMS:         She is on multiple medications as in the current list and also has multiple problems    PHYSICAL EXAM:  BP 108/62 mmHg  Pulse 96  Temp(Src) 98 F (36.7 C)  Resp 16  Ht 5\' 2"  (1.575 m)  Wt 214 lb 12.8 oz (97.433 kg)  BMI 39.28 kg/m2  SpO2 97%  Her face looks slightly puffy. She has small supraclavicular fat pads present and mild buffalo hump. No bruising of the skin She is conversing normally and answering questions  The biceps reflexes are normal  ASSESSMENT:   HYPOPITUITARISM with adrenal, thyroid and gonadotropin  deficiencies  Secondary adrenal insufficiency:. She is  subjectively doing  well with hydrocortisone supplementation with  no further weakness, better appetite and no further problems with hypotension However on her exam she looks mildly cushingoid and has gained a significant amount of weight Electrolytes are normal  SECONDARY HYPOTHYROIDISM  She has been fairly alert, is ambulating better and does not complain of any cold intolerance. Her free T4 is about the same and normal  PLAN:   She will reduce her hydrocortisone to 10 mg in the morning and evening for now She will maintain levothyroxine to 62.5 ug Needs to have blood pressure monitored regularly at home and to call if she is developing any weakness or nausea  Follow-up in 3 months again with electrolytes, thyroid levels and IGF-1 levels   Tien Spooner 06/01/2015, 7:51 AM

## 2015-06-01 ENCOUNTER — Encounter: Payer: Self-pay | Admitting: General Practice

## 2015-06-05 ENCOUNTER — Encounter: Payer: Self-pay | Admitting: Family Medicine

## 2015-06-05 MED ORDER — KEPPRA 500 MG PO TABS: 1500.0000 mg | ORAL_TABLET | Freq: Two times a day (BID) | ORAL | Status: DC

## 2015-06-05 NOTE — Telephone Encounter (Signed)
Medication filled to pharmacy as requested.   

## 2015-06-14 ENCOUNTER — Encounter: Payer: Self-pay | Admitting: Family Medicine

## 2015-06-14 MED ORDER — TRAZODONE HCL 50 MG PO TABS: 25.0000 mg | ORAL_TABLET | Freq: Every evening | ORAL | Status: DC | PRN

## 2015-06-18 ENCOUNTER — Other Ambulatory Visit: Payer: Self-pay | Admitting: General Practice

## 2015-06-18 MED ORDER — DULOXETINE HCL 60 MG PO CPEP: 60.0000 mg | ORAL_CAPSULE | Freq: Every day | ORAL | Status: DC

## 2015-07-11 ENCOUNTER — Encounter: Payer: Self-pay | Admitting: Family Medicine

## 2015-07-11 MED ORDER — PREGABALIN 150 MG PO CAPS: 150.0000 mg | ORAL_CAPSULE | Freq: Two times a day (BID) | ORAL | Status: DC

## 2015-07-11 MED ORDER — FENOFIBRATE 160 MG PO TABS: 160.0000 mg | ORAL_TABLET | Freq: Every day | ORAL | Status: DC

## 2015-07-11 NOTE — Telephone Encounter (Signed)
Last OV 12/13/14 lyrica last filled 03/27/15 #60 with 2

## 2015-07-11 NOTE — Telephone Encounter (Signed)
Medication filled to pharmacy as requested.   

## 2015-07-31 ENCOUNTER — Other Ambulatory Visit (INDEPENDENT_AMBULATORY_CARE_PROVIDER_SITE_OTHER): Payer: 59

## 2015-07-31 DIAGNOSIS — E230 Hypopituitarism: Secondary | ICD-10-CM | POA: Diagnosis not present

## 2015-07-31 LAB — BASIC METABOLIC PANEL
BUN: 21 mg/dL (ref 6–23)
CALCIUM: 9.6 mg/dL (ref 8.4–10.5)
CO2: 30 mEq/L (ref 19–32)
CREATININE: 0.86 mg/dL (ref 0.40–1.20)
Chloride: 105 mEq/L (ref 96–112)
GFR: 72.06 mL/min (ref >60.00)
Glucose, Bld: 95 mg/dL (ref 70–99)
Potassium: 4 mEq/L (ref 3.5–5.1)
Sodium: 144 mEq/L (ref 135–145)

## 2015-07-31 LAB — T4, FREE: FREE T4: 1.07 ng/dL (ref 0.60–1.60)

## 2015-08-03 ENCOUNTER — Ambulatory Visit (INDEPENDENT_AMBULATORY_CARE_PROVIDER_SITE_OTHER): Payer: 59 | Admitting: Endocrinology

## 2015-08-03 ENCOUNTER — Encounter: Payer: Self-pay | Admitting: Endocrinology

## 2015-08-03 VITALS — BP 122/88 | HR 76 | Wt 224.0 lb

## 2015-08-03 DIAGNOSIS — E038 Other specified hypothyroidism: Secondary | ICD-10-CM | POA: Diagnosis not present

## 2015-08-03 DIAGNOSIS — E2749 Other adrenocortical insufficiency: Secondary | ICD-10-CM | POA: Diagnosis not present

## 2015-08-03 MED ORDER — LEVOTHYROXINE SODIUM 75 MCG PO TABS: 75.0000 ug | ORAL_TABLET | Freq: Every day | ORAL | 3 refills | Status: DC

## 2015-08-03 NOTE — Patient Instructions (Signed)
New Rx will be 75ug daily

## 2015-08-03 NOTE — Progress Notes (Signed)
Patient ID: [name], female   DOB: 26-Feb-1955, 60 y.o.   MRN: [id number]           Chief complaint:   Follow-up of Adrenal insufficiency and hypopituitarism  History of Present Illness:    The patient was diagnosed to have adrenal insufficiency when she was hospitalized on 11/05/14 with a syncopal episode and hypotension. 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  With starting levothyroxine 50 g daily she  had felt significantly more energetic, alert and able to walk better She is currently on 62.5 g  daily Again she feels fairly good with normal energy level, no significant cold intolerance Again has gained weight as above  Her free T4 level is in normal range again   Wt Readings from Last 3 Encounters:  08/03/15 224 lb (101.6 kg)  05/31/15 214 lb 12.8 oz (97.4 kg)  03/05/15 197 lb (89.4 kg)    Labs:  Lab Results  Component Value Date   TSH 0.34 (L) 05/28/2015   TSH 1.396 11/05/2014   TSH 1.15 12/08/2013   FREET4 1.07 07/31/2015   FREET4 0.84 05/28/2015   FREET4 0.84 03/02/2015       Past Medical History:  Diagnosis Date  . Anemia 03/06/2013  . Bladder spasm   . Brain cancer ([place])    Frontal lobe, 1993 and 2005  . Fibromyalgia   . GERD (gastroesophageal reflux disease)   . Hyperchloremia   . Hypertension   . Malignant neoplasm of frontal lobe of brain ([place]) 09/18/2008   Qualifier: Diagnosis of  By: Birdie Riddle MD, Belenda Cruise    . Migraines   . Morton neuroma   . Right fibular fracture 07/12/2011  . 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 Seizures ([place])   . Stroke [facility])     Past Surgical History:  Procedure Laterality Date  . APPENDECTOMY  1976  . brain cancer     1993 and 2005  . CHOLECYSTECTOMY  08/02/2013   Procedure: LAPAROSCOPIC CHOLECYSTECTOMY;  Surgeon: Gwenyth Ober, MD;  Location: [place] [facility];  Service: General;;  . ESOPHAGOGASTRODUODENOSCOPY N/A 12/28/2013   Procedure: ESOPHAGOGASTRODUODENOSCOPY (EGD);  Surgeon: Inda Castle, MD;  Location: [place];  Service: Endoscopy;  Laterality: N/A;  . EXCISION MORTON'S NEUROMA     right foot  . ORIF ANKLE FRACTURE  07/12/2011   Procedure: OPEN REDUCTION INTERNAL FIXATION (ORIF) ANKLE FRACTURE;  Surgeon: Johnny Bridge, MD;  Location: [place];  Service: Orthopedics;  Laterality: Right;  . SAVORY DILATION N/A 12/28/2013   Procedure: SAVORY DILATION;  Surgeon: Inda Castle, MD;  Location: [place];  Service: Endoscopy;  Laterality: N/A;    Family History  Problem Relation Age of Onset  . Diabetes Mother   .  Diabetes Brother   . Hypertension Mother   . Hypertension Father     Social History:  reports that she has never smoked. She has never used smokeless tobacco. She reports that she does not drink alcohol or use drugs.  Allergies:  Allergies  Allergen Reactions  . Versed [Midazolam] Other (See Comments) and Anaphylaxis    Severe respiratory depression, had to be masked in preop holding.  . Ciprofloxacin     REACTION: NAUSEA AND VOMITING  . Cyclobenzaprine Itching    Other reaction(s): Itching / Pruritis (ALLERGY/intolerance)  . Methocarbamol     REACTION: itching Patient currently tolerating it as its on her home med list.  . Metronidazole Other (See Comments)    Unknown   . Penicillins Rash    REACTION: RASH      Medication List       Accurate as of 08/03/15  1:59 PM. Always use your most recent med list.          aspirin 81 MG EC tablet Take 1 tablet (81 mg total) by mouth daily.   aspirin-acetaminophen-caffeine [id number] MG tablet Commonly known as:  EXCEDRIN MIGRAINE Take by mouth every 6 (six) hours as needed for headache.   DULoxetine 60 MG capsule Commonly known as:  CYMBALTA Take 1 capsule (60 mg total) by mouth daily.   fenofibrate 160 MG tablet Take 1 tablet (160 mg total) by mouth daily.   fesoterodine 8 MG Tb24 tablet Commonly known as:  TOVIAZ Take 8 mg by mouth daily.   hydrocortisone 20 MG tablet Commonly known as:  CORTEF TAKE 1 TABLET BY MOUTH IN THE MORNING, AND TAKE 1/2 TABLET BY MOUTH DAILY IN THE EVENING   KEPPRA 500 MG tablet Generic drug:  levETIRAcetam Take 3 tablets (1,500 mg total) by mouth 2 (two) times daily.   levothyroxine 125 MCG tablet Commonly known as:  SYNTHROID, LEVOTHROID Take 1/2 tablet daily   linaclotide 145 MCG Caps capsule Commonly known as:  LINZESS Take 145 mcg by mouth daily as needed (constipation).   methocarbamol 500 MG tablet Commonly known as:  ROBAXIN Take 500 mg by mouth daily as needed for muscle  spasms.   nitrofurantoin 50 MG capsule Commonly known as:  MACRODANTIN Take 50 mg by mouth 2 (two) times daily. Maintenance   omeprazole 20 MG capsule Commonly known as:  PRILOSEC Take 1 capsule (20 mg total) by mouth daily.   pregabalin 150 MG capsule Commonly known as:  LYRICA Take 1 capsule (150 mg total) by mouth 2 (two) times daily.   promethazine 25 MG tablet Commonly known as:  PHENERGAN Take 1 tablet (25 mg total) by mouth every 6 (six) hours as needed for nausea or vomiting.   rosuvastatin 10 MG tablet Commonly known as:  CRESTOR TAKE 1  TABLET BY MOUTH  EVERY MONDAY, WEDNESDAY,  FRIDAY   traZODone 50 MG tablet Commonly known as:  DESYREL Take 0.5-1 tablets (25-50 mg total) by mouth at bedtime as needed for sleep.   TRIMETHOPRIM PO Take 100 mg by mouth daily. When active UTI       LABS:  Lab on 07/31/2015  Component Date Value Ref Range Status  . Sodium 07/31/2015 144  135 - 145 mEq/L Final  . Potassium 07/31/2015 4.0  3.5 - 5.1 mEq/L Final  . Chloride 07/31/2015 105  96 - 112 mEq/L Final  . CO2 07/31/2015 30  19 - 32 mEq/L Final  . Glucose, Bld 07/31/2015 95  70 - 99 mg/dL Final  . BUN 07/31/2015 21  6 - 23 mg/dL Final  . Creatinine, Ser 07/31/2015 0.86  0.40 - 1.20 mg/dL Final  . Calcium 07/31/2015 9.6  8.4 - 10.5 mg/dL Final  . GFR 07/31/2015 72.06  >60.00 mL/min Final  . Free T4 07/31/2015 1.07  0.60 - 1.60 ng/dL Final     REVIEW OF SYSTEMS:        Weight gain: Husband thinks that she has had chronic obesity   PHYSICAL EXAM:  BP 122/88 (BP Location: Left Arm, Patient Position: Sitting)   Pulse 76   Wt 224 lb (101.6 kg)   SpO2 92%   BMI 40.97 kg/m   Repeat blood pressure standing on the left arm 128/84  Her face looks slightly puffy. She has small supraclavicular fat pads present and mild buffalo hump. No bruising of the skin, skin on forearms appears thin  The biceps reflexes are normal No peripheral edema  ASSESSMENT:    HYPOPITUITARISM with adrenal, thyroid and gonadotropin  deficiencies  Secondary adrenal insufficiency:. She is subjectively doing  well  Even with  reducing her hydrocortisone supplementation  on the last visit because of weight gain Has no  weakness, better appetite and no orthostatic symptoms are declining blood pressure She still has mild cushingoid appearance Electrolytes are normal  SECONDARY HYPOTHYROIDISM  She has been fairly alert, feels fairly good with energy level and strength Her free T4 is  slightly higher compared to the last visit  Growth hormone levels: IGF-I is low normal at 51 but relatively stable, however not appearing to be symptomatic with any fatigue.  Will continue to follow  PLAN:   She will continue her hydrocortisone, 10 mgin the morning and evening for now  Not clear why she has had weight gain, she is not very active but she is not needing large portions Empirically will increase her Synthroid to 75 g as she is still taking a relatively small dose and free T4 is still in the lower part of the range   Needs to have blood pressure monitored regularly at home and to call if she is developing any weakness or nausea  Follow-up in 3 months again    [facility] 08/03/2015, 1:59 PM

## 2015-08-16 ENCOUNTER — Encounter: Payer: Self-pay | Admitting: Family Medicine

## 2015-08-16 MED ORDER — METHOCARBAMOL 500 MG PO TABS: 500.0000 mg | ORAL_TABLET | Freq: Three times a day (TID) | ORAL | 0 refills | Status: DC | PRN

## 2015-08-17 ENCOUNTER — Encounter: Payer: Self-pay | Admitting: Family Medicine

## 2015-08-21 ENCOUNTER — Other Ambulatory Visit: Payer: Self-pay | Admitting: Endocrinology

## 2015-09-21 ENCOUNTER — Other Ambulatory Visit: Payer: Self-pay | Admitting: Family Medicine

## 2015-09-21 ENCOUNTER — Other Ambulatory Visit: Payer: Self-pay | Admitting: Endocrinology

## 2015-10-04 ENCOUNTER — Encounter: Payer: Self-pay | Admitting: Family Medicine

## 2015-10-04 MED ORDER — TRAZODONE HCL 50 MG PO TABS: 25.0000 mg | ORAL_TABLET | Freq: Every evening | ORAL | 3 refills | Status: DC | PRN

## 2015-10-12 ENCOUNTER — Encounter: Payer: Self-pay | Admitting: Family Medicine

## 2015-10-12 MED ORDER — PREGABALIN 150 MG PO CAPS: 150.0000 mg | ORAL_CAPSULE | Freq: Two times a day (BID) | ORAL | 2 refills | Status: DC

## 2015-10-12 NOTE — Telephone Encounter (Signed)
Last OV 12/13/14 lyrica last filled 07/11/15 #60 with 2  No upcoming appts.

## 2015-10-12 NOTE — Telephone Encounter (Signed)
Ok to refill but she is due for her CPE in December and she needs to schedule this for additional refills

## 2015-10-19 ENCOUNTER — Encounter: Payer: Self-pay | Admitting: Family Medicine

## 2015-10-19 MED ORDER — PROMETHAZINE HCL 25 MG PO TABS: 25.0000 mg | ORAL_TABLET | Freq: Four times a day (QID) | ORAL | 6 refills | Status: DC | PRN

## 2015-10-29 ENCOUNTER — Other Ambulatory Visit (INDEPENDENT_AMBULATORY_CARE_PROVIDER_SITE_OTHER): Payer: 59

## 2015-10-29 DIAGNOSIS — E2749 Other adrenocortical insufficiency: Secondary | ICD-10-CM

## 2015-10-29 DIAGNOSIS — E038 Other specified hypothyroidism: Secondary | ICD-10-CM

## 2015-10-29 LAB — BASIC METABOLIC PANEL
BUN: 18 mg/dL (ref 6–23)
CALCIUM: 9.7 mg/dL (ref 8.4–10.5)
CO2: 33 meq/L — AB (ref 19–32)
Chloride: 104 mEq/L (ref 96–112)
Creatinine, Ser: 0.91 mg/dL (ref 0.40–1.20)
GFR: 67.45 mL/min (ref >60.00)
GLUCOSE: 84 mg/dL (ref 70–99)
Potassium: 3.9 mEq/L (ref 3.5–5.1)
SODIUM: 145 meq/L (ref 135–145)

## 2015-10-29 LAB — T4, FREE: Free T4: 0.76 ng/dL (ref 0.60–1.60)

## 2015-11-01 ENCOUNTER — Ambulatory Visit (INDEPENDENT_AMBULATORY_CARE_PROVIDER_SITE_OTHER): Payer: 59 | Admitting: Endocrinology

## 2015-11-01 ENCOUNTER — Encounter: Payer: Self-pay | Admitting: Endocrinology

## 2015-11-01 VITALS — BP 128/84 | HR 82 | Ht 62.0 in | Wt 237.0 lb

## 2015-11-01 DIAGNOSIS — E230 Hypopituitarism: Secondary | ICD-10-CM

## 2015-11-01 MED ORDER — LEVOTHYROXINE SODIUM 100 MCG PO TABS: 100.0000 ug | ORAL_TABLET | Freq: Every day | ORAL | 3 refills | Status: DC

## 2015-11-01 MED ORDER — DEXAMETHASONE 0.75 MG PO TABS: 0.7500 mg | ORAL_TABLET | Freq: Every day | ORAL | 0 refills | Status: AC

## 2015-11-01 NOTE — Progress Notes (Signed)
Patient ID: [name], female   DOB: 31-Oct-1955, 60 y.o.   MRN: [id number]           Chief complaint:   Follow-up of Adrenal insufficiency and hypopituitarism  History of Present Illness:    The patient was diagnosed to have adrenal insufficiency when she was hospitalized on 11/05/14 with a syncopal episode and hypotension. 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  With starting levothyroxine she  had felt significantly more energetic, alert and able to walk better She is currently on 75 g Again she feels fairly good with normal energy  level, no significant cold intolerance However has gained significant amount of weight and this continues to be progressively  Her free T4 level is in normal range but significantly lower than before   Wt Readings from Last 3 Encounters:  11/01/15 237 lb (107.5 kg)  08/03/15 224 lb (101.6 kg)  05/31/15 214 lb 12.8 oz (97.4 kg)    Labs:  Lab Results  Component Value Date   TSH 0.34 (L) 05/28/2015   TSH 1.396 11/05/2014   TSH 1.15 12/08/2013   FREET4 0.76 10/29/2015   FREET4 1.07 07/31/2015   FREET4 0.84 05/28/2015       Past Medical History:  Diagnosis Date  . Anemia 03/06/2013  . Bladder spasm   . Brain cancer ([place])    Frontal lobe, 1993 and 2005  . Fibromyalgia   . GERD (gastroesophageal reflux disease)   . Hyperchloremia   . Hypertension   . Malignant neoplasm of frontal lobe of brain ([place]) 09/18/2008   Qualifier: Diagnosis of  By: Birdie Riddle MD, Belenda Cruise    . Migraines   . Morton neuroma   . Right fibular fracture 07/12/2011  . 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 Seizures ([place])   . Stroke [facility])     Past Surgical History:  Procedure Laterality Date  . APPENDECTOMY  1976  . brain cancer     1993 and 2005  . CHOLECYSTECTOMY  08/02/2013   Procedure: LAPAROSCOPIC CHOLECYSTECTOMY;  Surgeon: Gwenyth Ober, MD;  Location: [place];  Service: General;;  . ESOPHAGOGASTRODUODENOSCOPY N/A 12/28/2013   Procedure: ESOPHAGOGASTRODUODENOSCOPY (EGD);  Surgeon: Inda Castle, MD;  Location: [employer];  Service: Endoscopy;  Laterality: N/A;  . EXCISION MORTON'S NEUROMA     right foot  . ORIF ANKLE FRACTURE  07/12/2011   Procedure: OPEN REDUCTION INTERNAL FIXATION (ORIF) ANKLE FRACTURE;  Surgeon: Johnny Bridge, MD;  Location: [place];  Service: Orthopedics;  Laterality: Right;  . SAVORY DILATION N/A 12/28/2013   Procedure: SAVORY DILATION;  Surgeon: Inda Castle, MD;  Location: [place];  Service: Endoscopy;  Laterality: N/A;    Family History  Problem Relation Age of  Onset  . Diabetes Mother   . Diabetes Brother   . Hypertension Mother   . Hypertension Father     Social History:  reports that she has never smoked. She has never used smokeless tobacco. She reports that she does not drink alcohol or use drugs.  Allergies:  Allergies  Allergen Reactions  . Versed [Midazolam] Other (See Comments) and Anaphylaxis    Severe respiratory depression, had to be masked in preop holding.  . Ciprofloxacin     REACTION: NAUSEA AND VOMITING  . Cyclobenzaprine Itching    Other reaction(s): Itching / Pruritis (ALLERGY/intolerance)  . Methocarbamol     REACTION: itching Patient currently tolerating it as its on her home med list.  . Metronidazole Other (See Comments)    Unknown   . Penicillins Rash    REACTION: RASH      Medication List       Accurate as of 11/01/15  2:15 PM. Always use your most recent med list.          aspirin 81 MG EC tablet Take 1 tablet (81 mg total) by mouth daily.   aspirin-acetaminophen-caffeine [id number] MG tablet Commonly known as:  EXCEDRIN MIGRAINE Take by mouth every 6 (six) hours as needed for headache.   DULoxetine 60 MG capsule Commonly known as:  CYMBALTA Take 1 capsule (60 mg total) by mouth daily.   fenofibrate 160 MG tablet Take 1 tablet (160 mg total) by mouth daily.   fesoterodine 8 MG Tb24 tablet Commonly known as:  TOVIAZ Take 8 mg by mouth daily.   hydrocortisone 20 MG tablet Commonly known as:  CORTEF TAKE 1 TABLET BY MOUTH EVERY MORNING, AND 1/2 TABLET IN THE EVENING   KEPPRA 500 MG tablet Generic drug:  levETIRAcetam Take 3 tablets (1,500 mg total) by mouth 2 (two) times daily.   levothyroxine 75 MCG tablet Commonly known as:  SYNTHROID, LEVOTHROID Take 1 tablet (75 mcg total) by mouth daily.   levothyroxine 125 MCG tablet Commonly known as:  SYNTHROID, LEVOTHROID TAKE 1/2 TABLET BY MOUTH DAILY   linaclotide 145 MCG Caps capsule Commonly known as:  LINZESS Take 145 mcg by mouth  daily as needed (constipation).   methocarbamol 500 MG tablet Commonly known as:  ROBAXIN Take 1 tablet (500 mg total) by mouth 3 (three) times daily as needed for muscle spasms.   nitrofurantoin 50 MG capsule Commonly known as:  MACRODANTIN Take 50 mg by mouth 2 (two) times daily. Maintenance   omeprazole 20 MG capsule Commonly known as:  PRILOSEC TAKE 1 CAPSULE BY MOUTH DAILY   pregabalin 150 MG capsule Commonly known as:  LYRICA Take 1 capsule (150 mg total) by mouth 2 (two) times daily.   promethazine 25 MG tablet Commonly known as:  PHENERGAN Take 1 tablet (25 mg total) by mouth every 6 (six) hours as needed  for nausea or vomiting.   rosuvastatin 10 MG tablet Commonly known as:  CRESTOR TAKE 1 TABLET BY MOUTH  EVERY MONDAY, WEDNESDAY,  FRIDAY   traZODone 50 MG tablet Commonly known as:  DESYREL Take 0.5-1 tablets (25-50 mg total) by mouth at bedtime as needed for sleep.   TRIMETHOPRIM PO Take 100 mg by mouth daily. When active UTI       LABS:  Lab on 10/29/2015  Component Date Value Ref Range Status  . Sodium 10/29/2015 145  135 - 145 mEq/L Final  . Potassium 10/29/2015 3.9  3.5 - 5.1 mEq/L Final  . Chloride 10/29/2015 104  96 - 112 mEq/L Final  . CO2 10/29/2015 33* 19 - 32 mEq/L Final  . Glucose, Bld 10/29/2015 84  70 - 99 mg/dL Final  . BUN 10/29/2015 18  6 - 23 mg/dL Final  . Creatinine, Ser 10/29/2015 0.91  0.40 - 1.20 mg/dL Final  . Calcium 10/29/2015 9.7  8.4 - 10.5 mg/dL Final  . GFR 10/29/2015 67.45  >60.00 mL/min Final  . Free T4 10/29/2015 0.76  0.60 - 1.60 ng/dL Final   Comment: Specimens from patients who are undergoing biotin therapy and /or ingesting biotin supplements may contain high levels of biotin.  The higher biotin concentration in these specimens interferes with this Free T4 assay.  Specimens that contain high levels  of biotin may cause false high results for this Free T4 assay.  Please interpret results in light of the total clinical  presentation of the patient.       REVIEW OF SYSTEMS:        Weight gain: Husband thinks that she has had chronic obesity   PHYSICAL EXAM:  BP 128/84   Pulse 82   Ht 5\' 2"  (1.575 m)   Wt 237 lb (107.5 kg)   SpO2 92%   BMI 43.35 kg/m   Repeat blood pressure standing on the left arm 134/100, patient does have some difficulty standing up for long  Her face looks full but not direct She has no supraclavicular fat pads present and mild buffalo hump. The skin on forearms appears thin, has a few petechial spots  The biceps reflexes are normal No peripheral edema  ASSESSMENT:   HYPOPITUITARISM with adrenal, thyroid and gonadotropin  deficiencies  Secondary adrenal insufficiency:. She is subjectively doing  well with 10 mg twice a day of hydrocortisone Has no  weakness or lightheadedness However continues to have significant weight gain Does not look clearly cushingoid  Electrolytes are normal  SECONDARY HYPOTHYROIDISM  She has been fairly alert, feels fairly good with energy level  Her free T4 is surprisingly lower compared to her last visit This may be related to her weight gain  Growth hormone levels: IGF-I has been low normal at 51 but relatively stable  PLAN:   She will continue her hydrocortisone, 10 mgin the morning and evening for now However will need to assess her endogenous cortisol production with dexamethasone 0.75 mg for 2 days  Not clear why she has had weight gain, she is not very active and does have releases to significant obesity  For her hypothyroidism since free T4 is significantly lower than before will increase the dose to 100 g  Needs to have blood pressure monitored regularly at home   Follow-up in 2 months again    [facility] 11/01/2015, 2:15 PM

## 2015-11-01 NOTE — Patient Instructions (Addendum)
Stay on 1/2 tab 2x daily Hydrocortisone  The day before urine collection start Dexamethasone 1 tab daily for 2 days instead of Hydrocortisone

## 2015-11-15 ENCOUNTER — Other Ambulatory Visit: Payer: Self-pay | Admitting: Endocrinology

## 2015-11-19 NOTE — Addendum Note (Signed)
Addended by: Kaylyn Lim I on: 11/19/2015 02:41 PM   Modules accepted: Orders

## 2015-11-22 LAB — CREATININE, URINE, 24 HOUR
CREATININE 24H UR: 811 mg/(24.h) (ref 800–1800)
CREATININE, UR: 77.2 mg/dL

## 2015-11-22 LAB — CORTISOL, URINE, FREE
CORTISOL (UR), FREE: 27 ug/(24.h) (ref 0–50)
Cortisol,F,ug/L,U: 26 ug/L

## 2015-11-27 ENCOUNTER — Ambulatory Visit: Payer: 59 | Admitting: Physician Assistant

## 2015-11-27 ENCOUNTER — Inpatient Hospital Stay (HOSPITAL_COMMUNITY)
Admission: EM | Admit: 2015-11-27 | Discharge: 2015-12-03 | DRG: 871 | Disposition: A | Discharge status: Skilled Nursing Facility | Payer: 59 | Attending: Internal Medicine | Admitting: Internal Medicine

## 2015-11-27 ENCOUNTER — Emergency Department (HOSPITAL_COMMUNITY): Payer: 59

## 2015-11-27 ENCOUNTER — Encounter (HOSPITAL_COMMUNITY): Payer: Self-pay

## 2015-11-27 ENCOUNTER — Telehealth: Payer: Self-pay | Admitting: Endocrinology

## 2015-11-27 ENCOUNTER — Encounter: Payer: Self-pay | Admitting: Endocrinology

## 2015-11-27 DIAGNOSIS — G40909 Epilepsy, unspecified, not intractable, without status epilepticus: Secondary | ICD-10-CM | POA: Diagnosis present

## 2015-11-27 DIAGNOSIS — J181 Lobar pneumonia, unspecified organism: Secondary | ICD-10-CM | POA: Diagnosis present

## 2015-11-27 DIAGNOSIS — C711 Malignant neoplasm of frontal lobe: Secondary | ICD-10-CM | POA: Diagnosis present

## 2015-11-27 DIAGNOSIS — G43909 Migraine, unspecified, not intractable, without status migrainosus: Secondary | ICD-10-CM | POA: Diagnosis present

## 2015-11-27 DIAGNOSIS — M79609 Pain in unspecified limb: Secondary | ICD-10-CM | POA: Diagnosis not present

## 2015-11-27 DIAGNOSIS — M6281 Muscle weakness (generalized): Secondary | ICD-10-CM

## 2015-11-27 DIAGNOSIS — Z8673 Personal history of transient ischemic attack (TIA), and cerebral infarction without residual deficits: Secondary | ICD-10-CM | POA: Diagnosis not present

## 2015-11-27 DIAGNOSIS — K219 Gastro-esophageal reflux disease without esophagitis: Secondary | ICD-10-CM | POA: Diagnosis present

## 2015-11-27 DIAGNOSIS — A419 Sepsis, unspecified organism: Secondary | ICD-10-CM | POA: Diagnosis present

## 2015-11-27 DIAGNOSIS — E038 Other specified hypothyroidism: Secondary | ICD-10-CM | POA: Diagnosis present

## 2015-11-27 DIAGNOSIS — Z85841 Personal history of malignant neoplasm of brain: Secondary | ICD-10-CM

## 2015-11-27 DIAGNOSIS — Z7982 Long term (current) use of aspirin: Secondary | ICD-10-CM

## 2015-11-27 DIAGNOSIS — Z8249 Family history of ischemic heart disease and other diseases of the circulatory system: Secondary | ICD-10-CM

## 2015-11-27 DIAGNOSIS — G219 Secondary parkinsonism, unspecified: Secondary | ICD-10-CM | POA: Diagnosis present

## 2015-11-27 DIAGNOSIS — I10 Essential (primary) hypertension: Secondary | ICD-10-CM | POA: Diagnosis present

## 2015-11-27 DIAGNOSIS — J9601 Acute respiratory failure with hypoxia: Secondary | ICD-10-CM | POA: Diagnosis present

## 2015-11-27 DIAGNOSIS — I2699 Other pulmonary embolism without acute cor pulmonale: Secondary | ICD-10-CM | POA: Diagnosis present

## 2015-11-27 DIAGNOSIS — E669 Obesity, unspecified: Secondary | ICD-10-CM | POA: Diagnosis not present

## 2015-11-27 DIAGNOSIS — E039 Hypothyroidism, unspecified: Secondary | ICD-10-CM | POA: Diagnosis present

## 2015-11-27 DIAGNOSIS — Z79899 Other long term (current) drug therapy: Secondary | ICD-10-CM | POA: Diagnosis not present

## 2015-11-27 DIAGNOSIS — R7989 Other specified abnormal findings of blood chemistry: Secondary | ICD-10-CM | POA: Diagnosis present

## 2015-11-27 DIAGNOSIS — Z888 Allergy status to other drugs, medicaments and biological substances status: Secondary | ICD-10-CM

## 2015-11-27 DIAGNOSIS — R197 Diarrhea, unspecified: Secondary | ICD-10-CM | POA: Diagnosis present

## 2015-11-27 DIAGNOSIS — F419 Anxiety disorder, unspecified: Secondary | ICD-10-CM | POA: Diagnosis present

## 2015-11-27 DIAGNOSIS — Z880 Allergy status to penicillin: Secondary | ICD-10-CM | POA: Diagnosis not present

## 2015-11-27 DIAGNOSIS — E271 Primary adrenocortical insufficiency: Secondary | ICD-10-CM | POA: Diagnosis present

## 2015-11-27 DIAGNOSIS — K5900 Constipation, unspecified: Secondary | ICD-10-CM | POA: Diagnosis present

## 2015-11-27 DIAGNOSIS — M797 Fibromyalgia: Secondary | ICD-10-CM | POA: Diagnosis present

## 2015-11-27 DIAGNOSIS — J189 Pneumonia, unspecified organism: Secondary | ICD-10-CM

## 2015-11-27 DIAGNOSIS — R531 Weakness: Secondary | ICD-10-CM

## 2015-11-27 DIAGNOSIS — R0902 Hypoxemia: Secondary | ICD-10-CM

## 2015-11-27 HISTORY — DX: Primary adrenocortical insufficiency: E27.1

## 2015-11-27 LAB — I-STAT CG4 LACTIC ACID, ED
LACTIC ACID, VENOUS: 5.73 mmol/L — AB (ref 0.5–1.9)
LACTIC ACID, VENOUS: 3.48 mmol/L — AB (ref 0.5–1.9)

## 2015-11-27 LAB — COMPREHENSIVE METABOLIC PANEL
ALT: 23 U/L (ref 14–54)
AST: 40 U/L (ref 15–41)
Albumin: 3.5 g/dL (ref 3.5–5.0)
Alkaline Phosphatase: 55 U/L (ref 38–126)
Anion gap: 14 (ref 5–15)
BILIRUBIN TOTAL: 0.6 mg/dL (ref 0.3–1.2)
BUN: 9 mg/dL (ref 6–20)
CHLORIDE: 105 mmol/L (ref 101–111)
CO2: 21 mmol/L — ABNORMAL LOW (ref 22–32)
CREATININE: 1.01 mg/dL — AB (ref 0.44–1.00)
Calcium: 9.7 mg/dL (ref 8.9–10.3)
Glucose, Bld: 146 mg/dL — ABNORMAL HIGH (ref 65–99)
POTASSIUM: 3.7 mmol/L (ref 3.5–5.1)
Sodium: 140 mmol/L (ref 135–145)
TOTAL PROTEIN: 6.9 g/dL (ref 6.5–8.1)

## 2015-11-27 LAB — CBC WITH DIFFERENTIAL/PLATELET
BASOS ABS: 0.1 10*3/uL (ref 0.0–0.1)
BASOS PCT: 0 %
Basophils Absolute: 0 10*3/uL (ref 0.0–0.1)
Basophils Relative: 0 %
EOS ABS: 0.2 10*3/uL (ref 0.0–0.7)
EOS PCT: 0 %
Eosinophils Absolute: 0 10*3/uL (ref 0.0–0.7)
Eosinophils Relative: 2 %
HCT: 39.9 % (ref 36.0–46.0)
HEMATOCRIT: 33.2 % — AB (ref 36.0–46.0)
Hemoglobin: 12.1 g/dL (ref 12.0–15.0)
Hemoglobin: 9.8 g/dL — ABNORMAL LOW (ref 12.0–15.0)
LYMPHS PCT: 7 %
Lymphocytes Relative: 20 %
Lymphs Abs: 2.8 10*3/uL (ref 0.7–4.0)
Lymphs Abs: 0.7 10*3/uL (ref 0.7–4.0)
MCH: 24.5 pg — ABNORMAL LOW (ref 26.0–34.0)
MCH: 23.9 pg — ABNORMAL LOW (ref 26.0–34.0)
MCHC: 30.3 g/dL (ref 30.0–36.0)
MCHC: 29.5 g/dL — AB (ref 30.0–36.0)
MCV: 80.8 fL (ref 78.0–100.0)
MCV: 81 fL (ref 78.0–100.0)
MONO ABS: 1.1 10*3/uL — AB (ref 0.1–1.0)
MONO ABS: 0.2 10*3/uL (ref 0.1–1.0)
MONOS PCT: 8 %
MONOS PCT: 2 %
NEUTROS ABS: 8.6 10*3/uL — AB (ref 1.7–7.7)
Neutro Abs: 9.7 10*3/uL — ABNORMAL HIGH (ref 1.7–7.7)
Neutrophils Relative %: 70 %
Neutrophils Relative %: 91 %
PLATELETS: 484 10*3/uL — AB (ref 150–400)
PLATELETS: 291 10*3/uL (ref 150–400)
RBC: 4.94 MIL/uL (ref 3.87–5.11)
RBC: 4.1 MIL/uL (ref 3.87–5.11)
RDW: 17 % — AB (ref 11.5–15.5)
RDW: 16.8 % — AB (ref 11.5–15.5)
WBC: 13.9 10*3/uL — ABNORMAL HIGH (ref 4.0–10.5)
WBC: 9.5 10*3/uL (ref 4.0–10.5)

## 2015-11-27 LAB — PROCALCITONIN: Procalcitonin: 0.49 ng/mL

## 2015-11-27 LAB — URINALYSIS, ROUTINE W REFLEX MICROSCOPIC
GLUCOSE, UA: NEGATIVE mg/dL
HGB URINE DIPSTICK: NEGATIVE
KETONES UR: 15 mg/dL — AB
Nitrite: NEGATIVE
PH: 6.5 (ref 5.0–8.0)
Protein, ur: 30 mg/dL — AB
Specific Gravity, Urine: 1.029 (ref 1.005–1.030)

## 2015-11-27 LAB — APTT: APTT: 37 s — AB (ref 24–36)

## 2015-11-27 LAB — TSH: TSH: 0.023 u[IU]/mL — ABNORMAL LOW (ref 0.350–4.500)

## 2015-11-27 LAB — PROTIME-INR
INR: 1.18
INR: 1.24
PROTHROMBIN TIME: 15 s (ref 11.4–15.2)
Prothrombin Time: 15.7 seconds — ABNORMAL HIGH (ref 11.4–15.2)

## 2015-11-27 LAB — LACTIC ACID, PLASMA: Lactic Acid, Venous: 1.3 mmol/L (ref 0.5–1.9)

## 2015-11-27 LAB — URINE MICROSCOPIC-ADD ON: RBC / HPF: NONE SEEN RBC/hpf (ref 0–5)

## 2015-11-27 MED ORDER — ONDANSETRON HCL 4 MG/2ML IJ SOLN: 4.0000 mg | Freq: Four times a day (QID) | INTRAMUSCULAR | Status: DC | PRN

## 2015-11-27 MED ORDER — HYDROCORTISONE NA SUCCINATE PF 100 MG IJ SOLR
100.0000 mg | Freq: Once | INTRAMUSCULAR | Status: AC
  Administered 2015-11-27: 100 mg via INTRAVENOUS
  Filled 2015-11-27: qty 2

## 2015-11-27 MED ORDER — SODIUM CHLORIDE 0.9 % IV BOLUS (SEPSIS)
3000.0000 mL | Freq: Once | INTRAVENOUS | Status: AC
  Administered 2015-11-27: 3000 mL via INTRAVENOUS

## 2015-11-27 MED ORDER — ACETAMINOPHEN 325 MG PO TABS
650.0000 mg | ORAL_TABLET | Freq: Four times a day (QID) | ORAL | Status: DC | PRN
  Administered 2015-11-29 – 2015-12-03 (×5): 650 mg via ORAL
  Filled 2015-11-27 (×5): qty 2

## 2015-11-27 MED ORDER — PROMETHAZINE HCL 25 MG PO TABS
25.0000 mg | ORAL_TABLET | Freq: Four times a day (QID) | ORAL | Status: DC | PRN
  Administered 2015-11-30 – 2015-12-02 (×3): 25 mg via ORAL
  Filled 2015-11-27 (×3): qty 1

## 2015-11-27 MED ORDER — METHOCARBAMOL 500 MG PO TABS: 500.0000 mg | ORAL_TABLET | Freq: Three times a day (TID) | ORAL | Status: DC | PRN

## 2015-11-27 MED ORDER — TRAZODONE HCL 50 MG PO TABS: 25.0000 mg | ORAL_TABLET | Freq: Every evening | ORAL | Status: DC | PRN

## 2015-11-27 MED ORDER — VANCOMYCIN HCL IN DEXTROSE 1-5 GM/200ML-% IV SOLN
1000.0000 mg | Freq: Once | INTRAVENOUS | Status: DC
  Filled 2015-11-27: qty 200

## 2015-11-27 MED ORDER — ENOXAPARIN SODIUM 40 MG/0.4ML ~~LOC~~ SOLN
40.0000 mg | SUBCUTANEOUS | Status: DC
  Administered 2015-11-28: 40 mg via SUBCUTANEOUS
  Filled 2015-11-27: qty 0.4

## 2015-11-27 MED ORDER — PREGABALIN 75 MG PO CAPS
150.0000 mg | ORAL_CAPSULE | Freq: Two times a day (BID) | ORAL | Status: DC
  Administered 2015-11-27 – 2015-12-03 (×12): 150 mg via ORAL
  Filled 2015-11-27 (×3): qty 2
  Filled 2015-11-27: qty 6
  Filled 2015-11-27: qty 2
  Filled 2015-11-27: qty 6
  Filled 2015-11-27: qty 2
  Filled 2015-11-27 (×3): qty 6
  Filled 2015-11-27: qty 2
  Filled 2015-11-27: qty 6

## 2015-11-27 MED ORDER — ASPIRIN EC 81 MG PO TBEC
81.0000 mg | DELAYED_RELEASE_TABLET | Freq: Every day | ORAL | Status: DC
  Administered 2015-11-27 – 2015-12-03 (×7): 81 mg via ORAL
  Filled 2015-11-27 (×7): qty 1

## 2015-11-27 MED ORDER — ONDANSETRON HCL 4 MG PO TABS: 4.0000 mg | ORAL_TABLET | Freq: Four times a day (QID) | ORAL | Status: DC | PRN

## 2015-11-27 MED ORDER — LEVETIRACETAM 750 MG PO TABS
1500.0000 mg | ORAL_TABLET | Freq: Two times a day (BID) | ORAL | Status: DC
  Administered 2015-11-27 – 2015-12-03 (×12): 1500 mg via ORAL
  Filled 2015-11-27 (×12): qty 2

## 2015-11-27 MED ORDER — LEVOTHYROXINE SODIUM 100 MCG PO TABS: 100.0000 ug | ORAL_TABLET | Freq: Every day | ORAL | Status: DC

## 2015-11-27 MED ORDER — TRIMETHOPRIM 100 MG PO TABS
100.0000 mg | ORAL_TABLET | Freq: Every day | ORAL | Status: DC
Start: 2015-11-27 — End: 2015-11-27

## 2015-11-27 MED ORDER — FESOTERODINE FUMARATE ER 8 MG PO TB24
8.0000 mg | ORAL_TABLET | Freq: Every day | ORAL | Status: DC
  Administered 2015-11-27 – 2015-12-03 (×7): 8 mg via ORAL
  Filled 2015-11-27 (×7): qty 1

## 2015-11-27 MED ORDER — ACETAMINOPHEN 650 MG RE SUPP: 650.0000 mg | Freq: Four times a day (QID) | RECTAL | Status: DC | PRN

## 2015-11-27 MED ORDER — LINACLOTIDE 145 MCG PO CAPS
145.0000 ug | ORAL_CAPSULE | Freq: Every day | ORAL | Status: DC | PRN
  Filled 2015-11-27 (×2): qty 1

## 2015-11-27 MED ORDER — PIPERACILLIN-TAZOBACTAM 3.375 G IVPB
3.3750 g | Freq: Three times a day (TID) | INTRAVENOUS | Status: DC
  Administered 2015-11-28: 3.375 g via INTRAVENOUS
  Filled 2015-11-27 (×2): qty 50

## 2015-11-27 MED ORDER — NITROFURANTOIN MACROCRYSTAL 50 MG PO CAPS: 50.0000 mg | ORAL_CAPSULE | Freq: Two times a day (BID) | ORAL | Status: DC

## 2015-11-27 MED ORDER — ROSUVASTATIN CALCIUM 10 MG PO TABS
10.0000 mg | ORAL_TABLET | ORAL | Status: DC
  Administered 2015-11-28 – 2015-12-03 (×3): 10 mg via ORAL
  Filled 2015-11-27 (×4): qty 1

## 2015-11-27 MED ORDER — ACETAMINOPHEN 500 MG PO TABS
1000.0000 mg | ORAL_TABLET | Freq: Once | ORAL | Status: AC
Start: 2015-11-27 — End: 2015-11-27
  Administered 2015-11-27: 1000 mg via ORAL
  Filled 2015-11-27: qty 2

## 2015-11-27 MED ORDER — FENOFIBRATE 160 MG PO TABS
160.0000 mg | ORAL_TABLET | Freq: Every day | ORAL | Status: DC
  Administered 2015-11-28 – 2015-12-03 (×6): 160 mg via ORAL
  Filled 2015-11-27 (×6): qty 1

## 2015-11-27 MED ORDER — SODIUM CHLORIDE 0.9 % IV SOLN
2000.0000 mg | Freq: Once | INTRAVENOUS | Status: AC
  Administered 2015-11-27: 2000 mg via INTRAVENOUS
  Filled 2015-11-27: qty 2000

## 2015-11-27 MED ORDER — SODIUM CHLORIDE 0.9 % IV SOLN
INTRAVENOUS | Status: DC
  Administered 2015-11-27: 23:00:00 via INTRAVENOUS
  Administered 2015-11-28: 1000 mL via INTRAVENOUS
  Administered 2015-11-29: 10:00:00 via INTRAVENOUS

## 2015-11-27 MED ORDER — PIPERACILLIN-TAZOBACTAM 3.375 G IVPB 30 MIN
3.3750 g | Freq: Once | INTRAVENOUS | Status: AC
  Administered 2015-11-27: 3.375 g via INTRAVENOUS
  Filled 2015-11-27: qty 50

## 2015-11-27 MED ORDER — SODIUM CHLORIDE 0.9 % IV BOLUS (SEPSIS): 1000.0000 mL | Freq: Once | INTRAVENOUS | Status: DC

## 2015-11-27 MED ORDER — DEXTROSE 5 % IV SOLN
2.0000 g | Freq: Two times a day (BID) | INTRAVENOUS | Status: DC
  Administered 2015-11-27 – 2015-11-30 (×6): 2 g via INTRAVENOUS
  Filled 2015-11-27 (×7): qty 2

## 2015-11-27 MED ORDER — VANCOMYCIN HCL 10 G IV SOLR
1250.0000 mg | INTRAVENOUS | Status: DC
  Administered 2015-11-28 – 2015-11-29 (×2): 1250 mg via INTRAVENOUS
  Filled 2015-11-27 (×3): qty 1250

## 2015-11-27 MED ORDER — PANTOPRAZOLE SODIUM 40 MG PO TBEC
40.0000 mg | DELAYED_RELEASE_TABLET | Freq: Every day | ORAL | Status: DC
  Administered 2015-11-28 – 2015-12-03 (×6): 40 mg via ORAL
  Filled 2015-11-27 (×6): qty 1

## 2015-11-27 MED ORDER — DULOXETINE HCL 60 MG PO CPEP
60.0000 mg | ORAL_CAPSULE | Freq: Every day | ORAL | Status: DC
  Administered 2015-11-28 – 2015-12-03 (×6): 60 mg via ORAL
  Filled 2015-11-27 (×6): qty 1

## 2015-11-27 NOTE — Progress Notes (Addendum)
Pharmacy Antibiotic Note  [name] is a 60 y.o. female admitted on 11/27/2015 with weakness, diarrhea and wheezing. Pt has a history of panhypopituitarism for which she receives chronic hydrocortisone. She also has a hx of frontal lobe glioma s/p radiation. Planning to start broad spectrum antibiotics for septic appearance. WBC 13.9, LA 5.7, Tm 101.2, SCr 1, eCrCl 60-70 ml/min.   Plan: -Vancomycin 2 g IV x1 then  1250 mg IV q24h -Zosyn 3.375 g IV q8h -Monitor renal fx, cultures, obtain VT at steady state   Height: 5\' 2"  (157.5 cm) Weight: 230 lb (104.3 kg) IBW/kg (Calculated) : 50.1  Temp (24hrs), Avg:99.6 F (37.6 C), Min:98 F (36.7 C), Max:101.2 F (38.4 C)   Recent Labs Lab 11/27/15 1445 11/27/15 1451  WBC 13.9*  --   CREATININE 1.01*  --   LATICACIDVEN  --  5.73*    Estimated Creatinine Clearance: 68.8 mL/min (by C-G formula based on SCr of 1.01 mg/dL (H)).    Allergies  Allergen Reactions  . Versed [Midazolam] Other (See Comments) and Anaphylaxis    Severe respiratory depression, had to be masked in preop holding.  . Ciprofloxacin     REACTION: NAUSEA AND VOMITING  . Cyclobenzaprine Itching    Other reaction(s): Itching / Pruritis (ALLERGY/intolerance)  . Methocarbamol     REACTION: itching Patient currently tolerating it as its on her home med list.  . Metronidazole Other (See Comments)    Unknown   . Penicillins Rash    REACTION: RASH    Antimicrobials this admission: 11/21 vancomycin > 11/21 zosyn >  Dose adjustments this admission: NA   Microbiology results: 11/21 blood cx: 11/21 urine cx: 11/21 cdiff:   Thank you for allowing pharmacy to be a part of this patient's care.  Harvel Quale 11/27/2015 5:06 PM    Addendum -Changing zosyn to cefepime -Cefepime 2 g IV q12h  Harvel Quale  11/27/2015 7:00 PM

## 2015-11-27 NOTE — ED Notes (Signed)
Ordered diet tray 

## 2015-11-27 NOTE — ED Notes (Signed)
Admitting md at bedside

## 2015-11-27 NOTE — ED Provider Notes (Signed)
[facility] DEPT Provider Note   CSN: [id number] Arrival date & time: 11/27/15  1415     History   Chief Complaint Chief Complaint  Patient presents with  . Weakness  . Diarrhea    HPI [name] is a 60 y.o. female.   Weakness  Primary symptoms comment: generalized weakness. This is a new problem. The current episode started 2 days ago. The problem has been gradually worsening. There was no focality noted. There has been no fever. Associated symptoms include shortness of breath and confusion. Pertinent negatives include no chest pain and no vomiting. There were no medications administered prior to arrival.    Past Medical History:  Diagnosis Date  . Addison's disease ([place])   . Anemia 03/06/2013  . Bladder spasm   . Brain cancer ([place])    Frontal lobe, 1993 and 2005  . Fibromyalgia   . GERD (gastroesophageal reflux disease)   . Hyperchloremia   . Hypertension   . 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 Migraines   . Morton neuroma   . Right fibular fracture 07/12/2011  . 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 Seizures ([place])   . Stroke [facility])     Patient Active Problem List   Diagnosis Date Noted  . Seizure disorder ([place]) 11/27/2015  . Adrenal insufficiency ([place]) 11/22/2014  . 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 Hypotension due to drugs   . Disorientation   . Urinary tract infection, site not specified   . Acute kidney injury ([place])   . Essential hypertension   . 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 Sepsis ([place]) 11/06/2014  . AKI (acute kidney injury) ([place]) 11/06/2014  . Stroke-like symptoms   . Yeast infection of the skin 09/22/2014  . Cognitive impairment 02/09/2014  . Gross motor impairment 02/09/2014  . Esophageal stricture 12/28/2013  . GI bleed 12/27/2013  . CVA (cerebral infarction) 12/27/2013  . Dysphagia, pharyngoesophageal phase 12/27/2013  . Dysphagia   . Pressure ulcer  stage II 12/08/2013  . 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 HTN (hypertension) 08/31/2013  . Postop check 08/12/2013  . Pyelonephritis 07/31/2013  . Hepatitis 07/31/2013  . Acute hepatitis 07/31/2013  . UTI (urinary tract infection) 03/06/2013  . Hypotension 03/06/2013  . Weakness of both legs 03/06/2013  . UTI (lower urinary tract infection) 03/06/2013  . Anemia 03/06/2013  . Weakness 03/06/2013  . Vaginal discharge 09/19/2011  . Right fibular fracture 07/12/2011  . Sinusitis, acute 03/14/2011  . Persistent disorder of initiating or maintaining sleep 03/14/2011  . Routine general medical examination at a health care facility 12/04/2010  . Screening for malignant neoplasm of the cervix 12/04/2010  . Migraine 08/30/2010  . Fibromyalgia 04/05/2010  . DEPRESSIVE DISORDER 11/27/2009  . MYALGIA 11/27/2009  . LEG CRAMPS 11/27/2009  . INSOMNIA-SLEEP DISORDER-UNSPEC 11/27/2009  . DYSURIA 08/27/2009  . FUNGAL DERMATITIS 07/16/2009  . Obesity 07/16/2009  . RHINITIS 06/28/2009  . Hyperlipidemia 10/16/2008  . 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 BACK PAIN, THORACIC REGION 09/18/2008    Past Surgical History:  Procedure Laterality Date  . APPENDECTOMY  1976  . brain cancer     1993 and 2005  . CHOLECYSTECTOMY  08/02/2013   Procedure: LAPAROSCOPIC CHOLECYSTECTOMY;  Surgeon: Gwenyth Ober, MD;  Location: [facility] OR;  Service: General;;  . ESOPHAGOGASTRODUODENOSCOPY N/A 12/28/2013   Procedure: ESOPHAGOGASTRODUODENOSCOPY (EGD);  Surgeon: Herbie Baltimore  Shaaron Adler, MD;  Location: [facility];  Service: Endoscopy;  Laterality: N/A;  . EXCISION MORTON'S NEUROMA     right foot  . ORIF ANKLE FRACTURE  07/12/2011   Procedure: OPEN REDUCTION INTERNAL FIXATION (ORIF) ANKLE FRACTURE;  Surgeon: Johnny Bridge, MD;  Location: [facility];  Service: Orthopedics;  Laterality: Right;  . SAVORY DILATION N/A 12/28/2013   Procedure: SAVORY DILATION;  Surgeon: Inda Castle, MD;  Location: [facility];  Service: Endoscopy;  Laterality: N/A;    OB History    No data available       Home Medications    Prior to Admission medications   Medication Sig Start Date End Date Taking? Authorizing Provider  aspirin EC 81 MG EC tablet Take 1 tablet (81 mg total) by mouth daily. 12/29/13  Yes Bonnielee Haff, MD  aspirin-acetaminophen-caffeine (EXCEDRIN MIGRAINE) [phone] MG per tablet Take by mouth every 6 (six) hours as needed for headache.   Yes Historical Provider, MD  Calcium Citrate-Vitamin D (CITRACAL + D PO) Take 1 tablet by mouth daily.   Yes Historical Provider, MD  DULoxetine (CYMBALTA) 60 MG capsule Take 1 capsule (60 mg total) by mouth daily. 06/18/15  Yes Midge Minium, MD  fenofibrate 160 MG tablet Take 1 tablet (160 mg total) by mouth daily. 07/11/15  Yes Midge Minium, MD  KEPPRA 500 MG tablet Take 3 tablets (1,500 mg total) by mouth 2 (two) times daily. 06/05/15  Yes Midge Minium, MD  methocarbamol (ROBAXIN) 500 MG tablet Take 1 tablet (500 mg total) by mouth 3 (three) times daily as needed for muscle spasms. 08/16/15  Yes Midge Minium, MD  omeprazole (PRILOSEC) 20 MG capsule TAKE 1 CAPSULE BY MOUTH DAILY 09/21/15  Yes Midge Minium, MD  pregabalin (LYRICA) 150 MG capsule Take 1 capsule (150 mg total) by mouth 2 (two) times daily. 10/12/15  Yes Midge Minium, MD  promethazine (PHENERGAN) 25 MG tablet Take 1 tablet (25 mg total) by mouth every 6 (six) hours as needed for nausea or vomiting. 10/19/15  Yes Midge Minium, MD  rosuvastatin (CRESTOR) 10 MG tablet TAKE 1 TABLET BY MOUTH  EVERY MONDAY, [place],  FRIDAY 05/22/15  Yes Midge Minium, MD  traZODone (DESYREL) 50 MG tablet Take 0.5-1 tablets (25-50 mg total) by mouth at bedtime as needed for sleep. Patient taking differently: Take 50 mg by mouth at bedtime.  10/04/15  Yes Midge Minium, MD  hydrocortisone (CORTEF) 20 MG tablet TAKE 1 TABLET BY MOUTH EVERY MORNING AND1/2 TABLET IN  THE EVENING 11/15/15   Elayne Snare, MD  levothyroxine (SYNTHROID, LEVOTHROID) 100 MCG tablet Take 1 tablet (100 mcg total) by mouth daily. Patient taking differently: Take 50 mcg by mouth daily.  11/01/15   Elayne Snare, MD    Family History Family History  Problem Relation Age of Onset  . Diabetes Mother   . Hypertension Mother   . Hypertension Father   . Diabetes Brother     Social History Social History  Substance Use Topics  . Smoking status: Never Smoker  . Smokeless tobacco: Never Used  . Alcohol use No     Allergies   Versed [midazolam]; Ciprofloxacin; Cyclobenzaprine; Methocarbamol; Metronidazole; and Penicillins   Review of Systems Review of Systems  Constitutional: Positive for chills and fatigue. Negative for fever.  HENT: Negative for ear pain and sore throat.   Eyes: Negative for pain and visual disturbance.  Respiratory: Positive for shortness of breath. Negative  for cough.   Cardiovascular: Negative for chest pain and palpitations.  Gastrointestinal: Negative for abdominal pain and vomiting.  Genitourinary: Positive for frequency. Negative for dysuria and hematuria.  Musculoskeletal: Negative for arthralgias and back pain.  Skin: Negative for color change and rash.  Neurological: Positive for tremors and weakness. Negative for seizures and syncope.  Psychiatric/Behavioral: Positive for confusion.  All other systems reviewed and are negative.    Physical Exam Updated Vital Signs BP 127/88 (BP Location: Right Arm)   Pulse 98   Temp 97.7 F (36.5 C) (Oral)   Resp 17   Ht 5\' 2"  (1.575 m)   Wt 110.4 kg   SpO2 94%   BMI 44.52 kg/m   Physical Exam  Constitutional: She is oriented to person, place, and time. She appears well-developed and well-nourished. No distress.  HENT:  Head: Normocephalic and atraumatic.  Eyes: Conjunctivae are normal.  Neck: Neck supple.  Cardiovascular: Regular rhythm.  Tachycardia present.   No murmur  heard. Pulmonary/Chest: Effort normal and breath sounds normal. No tachypnea. No respiratory distress.  Transmitted upper airway sounds  Abdominal: Soft. There is generalized tenderness.  Musculoskeletal: She exhibits no edema.  Neurological: She is alert and oriented to person, place, and time. No cranial nerve deficit or sensory deficit. She exhibits normal muscle tone.  Skin: Skin is warm and dry.  Psychiatric: She has a normal mood and affect.  Nursing note and vitals reviewed.    ED Treatments / Results  Labs (all labs ordered are listed, but only abnormal results are displayed) Labs Reviewed  COMPREHENSIVE METABOLIC PANEL - Abnormal; Notable for the following:       Result Value   CO2 21 (*)    Glucose, Bld 146 (*)    Creatinine, Ser 1.01 (*)    All other components within normal limits  CBC WITH DIFFERENTIAL/PLATELET - Abnormal; Notable for the following:    WBC 13.9 (*)    MCH 24.5 (*)    RDW 17.0 (*)    Platelets 484 (*)    Neutro Abs 9.7 (*)    Monocytes Absolute 1.1 (*)    All other components within normal limits  URINALYSIS, ROUTINE W REFLEX MICROSCOPIC (NOT AT [facility]) - Abnormal; Notable for the following:    Color, Urine AMBER (*)    Bilirubin Urine SMALL (*)    Ketones, ur 15 (*)    Protein, ur 30 (*)    Leukocytes, UA SMALL (*)    All other components within normal limits  TSH - Abnormal; Notable for the following:    TSH 0.023 (*)    All other components within normal limits  URINE MICROSCOPIC-ADD ON - Abnormal; Notable for the following:    Squamous Epithelial / LPF 0-5 (*)    Bacteria, UA FEW (*)    All other components within normal limits  CBC WITH DIFFERENTIAL/PLATELET - Abnormal; Notable for the following:    Hemoglobin 9.8 (*)    HCT 33.2 (*)    MCH 23.9 (*)    MCHC 29.5 (*)    RDW 16.8 (*)    Neutro Abs 8.6 (*)    All other components within normal limits  PROTIME-INR - Abnormal; Notable for the following:    Prothrombin Time 15.7 (*)     All other components within normal limits  APTT - Abnormal; Notable for the following:    aPTT 37 (*)    All other components within normal limits  I-STAT CG4 LACTIC ACID, ED -  Abnormal; Notable for the following:    Lactic Acid, Venous 5.73 (*)    All other components within normal limits  I-STAT CG4 LACTIC ACID, ED - Abnormal; Notable for the following:    Lactic Acid, Venous 3.48 (*)    All other components within normal limits  CULTURE, BLOOD (ROUTINE X 2)  CULTURE, BLOOD (ROUTINE X 2)  URINE CULTURE  C DIFFICILE QUICK SCREEN W PCR REFLEX  GASTROINTESTINAL PANEL BY PCR, STOOL (REPLACES STOOL CULTURE)  MRSA PCR SCREENING  PROTIME-INR  LACTIC ACID, PLASMA  PROCALCITONIN  T4, FREE  LEVETIRACETAM LEVEL  LACTIC ACID, PLASMA  CBC  CBC  BASIC METABOLIC PANEL  I-STAT BETA HCG BLOOD, ED ([facility], [facility], [facility] ONLY)    EKG  EKG Interpretation None       Radiology Dg Chest Port 1 View  Result Date: 11/27/2015 CLINICAL DATA:  Fever EXAM: PORTABLE CHEST 1 VIEW COMPARISON:  November 05, 2014 FINDINGS: The degree of inspiration is shallow. There is atelectatic change in the right upper and lower lobe regions. Lungs elsewhere clear. Heart is upper normal in size with pulmonary vascularity within normal limits. No adenopathy. No bone lesions. IMPRESSION: Shallow degree of inspiration. Patchy atelectasis on the right. No edema or consolidation. Stable cardiac silhouette. Electronically Signed   By: Lowella Grip III M.D.   On: 11/27/2015 16:18    Procedures Procedures (including critical care time)  Medications Ordered in ED Medications  pregabalin (LYRICA) capsule 150 mg (150 mg Oral Given 11/27/15 2320)  promethazine (PHENERGAN) tablet 25 mg (not administered)  methocarbamol (ROBAXIN) tablet 500 mg (not administered)  pantoprazole (PROTONIX) EC tablet 40 mg (not administered)  DULoxetine (CYMBALTA) DR capsule 60 mg (not administered)  levETIRAcetam (KEPPRA) tablet 1,500 mg (1,500 mg  Oral Given 11/27/15 2320)  rosuvastatin (CRESTOR) tablet 10 mg (not administered)  fesoterodine (TOVIAZ) tablet 8 mg (8 mg Oral Given 11/27/15 2320)  linaclotide (LINZESS) capsule 145 mcg (not administered)  aspirin EC tablet 81 mg (81 mg Oral Given 11/27/15 2320)  traZODone (DESYREL) tablet 25-50 mg (not administered)  fenofibrate tablet 160 mg (not administered)  piperacillin-tazobactam (ZOSYN) IVPB 3.375 g (3.375 g Intravenous Given 11/28/15 0114)  vancomycin (VANCOCIN) 1,250 mg in sodium chloride 0.9 % 250 mL IVPB (not administered)  enoxaparin (LOVENOX) injection 40 mg (not administered)  0.9 %  sodium chloride infusion ( Intravenous New Bag/Given 11/27/15 2316)  acetaminophen (TYLENOL) tablet 650 mg (not administered)    Or  acetaminophen (TYLENOL) suppository 650 mg (not administered)  ondansetron (ZOFRAN) tablet 4 mg (not administered)    Or  ondansetron (ZOFRAN) injection 4 mg (not administered)  ceFEPIme (MAXIPIME) 2 g in dextrose 5 % 50 mL IVPB (2 g Intravenous Given 11/27/15 2316)  hydrocortisone sodium succinate (SOLU-CORTEF) 100 MG injection 100 mg (100 mg Intravenous Given 11/27/15 1624)  sodium chloride 0.9 % bolus 3,000 mL (0 mLs Intravenous Stopped 11/27/15 1711)  piperacillin-tazobactam (ZOSYN) IVPB 3.375 g (0 g Intravenous Stopped 11/27/15 1742)  vancomycin (VANCOCIN) 2,000 mg in sodium chloride 0.9 % 500 mL IVPB (0 mg Intravenous Stopped 11/27/15 2024)  acetaminophen (TYLENOL) tablet 1,000 mg (1,000 mg Oral Given 11/27/15 1825)     Initial Impression / Assessment and Plan / ED Course  I have reviewed the triage vital signs and the nursing notes.  Pertinent labs & imaging results that were available during my care of the patient were reviewed by me and considered in my medical decision making (see chart for details).  Clinical Course  60 year old female with past medical history of Addison's disease tumors in her brain that have been resected, seizure  disorder, recent illness requiring antibiotic use. She comes today with weakness fatigue diarrheal episodes that are nonbloody. She also is tachycardic and at triage had a lactic acidosis of 5.4. At that time her blood pressure was stable to 120s over 80s, sepsis protocol was initiated. Her oral temperature was 98. We will get a rectal temperature. Husband states she has had several urinary tract infections in the past he thinks that's with going on here. We will evaluate for infections of the blood urine and lungs as well as for infectious diarrhea. Patient is given fluid bolus.  Stress dose Solu-Cortef is given. Antibiotics were given. Patient will be seen by neurology as an inpatient and admitted to the stepdown unit. Vital signs are stable at time of handoff. Lactic acid is down turning after large fluid bolus. For remainder this patient's care please see inpatient team notes.  Final Clinical Impressions(s) / ED Diagnoses   Final diagnoses:  Sepsis ([place])  Sepsis, due to unspecified organism [facility])    New Prescriptions Current Discharge Medication List       Dewaine Conger, MD [phone]    Delora Fuel, MD [phone]

## 2015-11-27 NOTE — Telephone Encounter (Signed)
Pt is now in the hospital, husband calling again for the dosing of the pts levothyroxin and hydrocortisone-please see the my chart meds

## 2015-11-27 NOTE — H&P (Signed)
[facility] History and Physical  MAGABY BEEGLE QMV:[id number] DOB: January 04, 1956 DOA: 11/27/2015  PCP: Neena Rhymes, MD  Patient coming from: home  Chief Complaint: Generalized weakness, diarrhea  HPI: [name] is a 60 y.o. female with a medical history of Addison's disease, brain cancer, seizure disorder, hypothyroidism, presented to the emergency department with complaints of generalized weakness, fatigue, and diarrhea. Patient stated that she started having diarrhea yesterday and feeling upset her stomach. She denies any anything abnormal. She also had decreased appetite throughout the day and decreased oral intake. Per husband, at approximately 1 AM this morning, patient was having a bowel movement, when he noticed that she started to stare off into space and was unresponsive to him. Episode lasted approximately 60 seconds. Husband states the patient has had seizures related to her brain cancer however since removal the seizures have improved. Patient was on generic Keppra one point time which wasn't changed the brand name which also helped to decrease her seizure activity. He feels that she may have a urinary tract infection. Patient was recently on clindamycin for tooth infection. Patient denies any chest pain, shortness of breath, headache, dizziness, recent travel, ill contacts. She has not had any problems with urination although has had frequent urinary tract infections in the past. Of note, patient has not been taking her Synthroid or Solu-Cortef for the last several weeks. Patient was last seen by her endocrinologist 11/01/2015.  ED Course: Found to have possible sepsis, sepsis protocol initiated. Urinalysis and chest x-ray unremarkable for infection. Stool sent for C. difficile and GI pathogen panel. Patient given vancomycin and Zosyn. [facility] called for admission.  Review of Systems:  All other systems reviewed and are negative.   Past Medical History:  Diagnosis Date  .  Addison's disease (HCC)   . Anemia 03/06/2013  . Bladder spasm   . Brain cancer (HCC)    Frontal lobe, 1993 and 2005  . Fibromyalgia   . GERD (gastroesophageal reflux disease)   . Hyperchloremia   . Hypertension   . 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 Migraines   . Morton neuroma   . Right fibular fracture 07/12/2011  . Secondary Parkinson disease (HCC)   . Seizures (HCC)   . Stroke [facility])     Past Surgical History:  Procedure Laterality Date  . APPENDECTOMY  1976  . brain cancer     1993 and 2005  . CHOLECYSTECTOMY  08/02/2013   Procedure: LAPAROSCOPIC CHOLECYSTECTOMY;  Surgeon: Cherylynn Ridges, MD;  Location: [facility] OR;  Service: General;;  . ESOPHAGOGASTRODUODENOSCOPY N/A 12/28/2013   Procedure: ESOPHAGOGASTRODUODENOSCOPY (EGD);  Surgeon: Louis Meckel, MD;  Location: [facility] ENDOSCOPY;  Service: Endoscopy;  Laterality: N/A;  . EXCISION MORTON'S NEUROMA     right foot  . ORIF ANKLE FRACTURE  07/12/2011   Procedure: OPEN REDUCTION INTERNAL FIXATION (ORIF) ANKLE FRACTURE;  Surgeon: Eulas Post, MD;  Location: [facility] OR;  Service: Orthopedics;  Laterality: Right;  . SAVORY DILATION N/A 12/28/2013   Procedure: SAVORY DILATION;  Surgeon: Louis Meckel, MD;  Location: [facility] ENDOSCOPY;  Service: Endoscopy;  Laterality: N/A;    Social History:  reports that she has never smoked. She has never used smokeless tobacco. She reports that she does not drink alcohol or use drugs.   Allergies  Allergen Reactions  . Versed [Midazolam] Other (See Comments) and Anaphylaxis    Severe respiratory depression,  had to be masked in preop holding.  . Ciprofloxacin     REACTION: NAUSEA AND VOMITING  . Cyclobenzaprine Itching    Other reaction(s): Itching / Pruritis (ALLERGY/intolerance)  . Methocarbamol     REACTION: itching Patient currently tolerating it as its on her home med list.  . Metronidazole Other (See Comments)     Unknown   . Penicillins Rash    REACTION: RASH    Family History  Problem Relation Age of Onset  . Diabetes Mother   . Hypertension Mother   . Hypertension Father   . Diabetes Brother       Prior to Admission medications   Medication Sig Start Date End Date Taking? Authorizing Provider  aspirin EC 81 MG EC tablet Take 1 tablet (81 mg total) by mouth daily. 12/29/13  Yes Osvaldo Shipper, MD  aspirin-acetaminophen-caffeine (EXCEDRIN MIGRAINE) [phone] MG per tablet Take by mouth every 6 (six) hours as needed for headache.   Yes Historical Provider, MD  Calcium Citrate-Vitamin D (CITRACAL + D PO) Take 1 tablet by mouth daily.   Yes Historical Provider, MD  DULoxetine (CYMBALTA) 60 MG capsule Take 1 capsule (60 mg total) by mouth daily. 06/18/15  Yes Sheliah Hatch, MD  fenofibrate 160 MG tablet Take 1 tablet (160 mg total) by mouth daily. 07/11/15  Yes Sheliah Hatch, MD  KEPPRA 500 MG tablet Take 3 tablets (1,500 mg total) by mouth 2 (two) times daily. 06/05/15  Yes Sheliah Hatch, MD  methocarbamol (ROBAXIN) 500 MG tablet Take 1 tablet (500 mg total) by mouth 3 (three) times daily as needed for muscle spasms. 08/16/15  Yes Sheliah Hatch, MD  omeprazole (PRILOSEC) 20 MG capsule TAKE 1 CAPSULE BY MOUTH DAILY 09/21/15  Yes Sheliah Hatch, MD  pregabalin (LYRICA) 150 MG capsule Take 1 capsule (150 mg total) by mouth 2 (two) times daily. 10/12/15  Yes Sheliah Hatch, MD  promethazine (PHENERGAN) 25 MG tablet Take 1 tablet (25 mg total) by mouth every 6 (six) hours as needed for nausea or vomiting. 10/19/15  Yes Sheliah Hatch, MD  rosuvastatin (CRESTOR) 10 MG tablet TAKE 1 TABLET BY MOUTH  EVERY MONDAY, WEDNESDAY,  FRIDAY 05/22/15  Yes Sheliah Hatch, MD  traZODone (DESYREL) 50 MG tablet Take 0.5-1 tablets (25-50 mg total) by mouth at bedtime as needed for sleep. Patient taking differently: Take 50 mg by mouth at bedtime.  10/04/15  Yes Sheliah Hatch, MD    fesoterodine (TOVIAZ) 8 MG TB24 tablet Take 8 mg by mouth daily.    Historical Provider, MD  hydrocortisone (CORTEF) 20 MG tablet TAKE 1 TABLET BY MOUTH EVERY MORNING AND1/2 TABLET IN THE EVENING 11/15/15   Reather Littler, MD  levothyroxine (SYNTHROID, LEVOTHROID) 100 MCG tablet Take 1 tablet (100 mcg total) by mouth daily. Patient taking differently: Take 50 mcg by mouth daily.  11/01/15   Reather Littler, MD  levothyroxine (SYNTHROID, LEVOTHROID) 125 MCG tablet TAKE 1/2 TABLET BY MOUTH DAILY 11/15/15   Reather Littler, MD  Linaclotide [facility]) 145 MCG CAPS capsule Take 145 mcg by mouth daily as needed (constipation).     Historical Provider, MD  nitrofurantoin (MACRODANTIN) 50 MG capsule Take 50 mg by mouth 2 (two) times daily. Maintenance    Historical Provider, MD  TRIMETHOPRIM PO Take 100 mg by mouth daily. When active UTI    Historical Provider, MD    Physical Exam: Vitals:   11/27/15 1730 11/27/15 1748  BP: 140/95 108/70  Pulse: 99 99  Resp: 21 23  Temp:       General: Well developed, well nourished, NAD, appears stated age  HEENT: NCAT, PERRLA, EOMI, Anicteic Sclera, mucous membranes moist.   Neck: Supple, no JVD, no masses  Cardiovascular: S1 S2 auscultated, no rubs, murmurs or gallops. tachycardic  Respiratory: Clear to auscultation bilaterally with equal chest rise  Abdomen: Soft, obese, mild diffuse TTP, nondistended, + bowel sounds  Extremities: warm dry without cyanosis clubbing or edema  Neuro: AAOx3, cranial nerves grossly intact. Strength 5/5 in patient's upper and lower extremities bilaterally  Skin: Without rashes exudates or nodules  Psych: Normal affect and demeanor with intact judgement and insight  Labs on Admission: I have personally reviewed following labs and imaging studies CBC:  Recent Labs Lab 11/27/15 1445  WBC 13.9*  NEUTROABS 9.7*  HGB 12.1  HCT 39.9  MCV 80.8  PLT 484*   Basic Metabolic Panel:  Recent Labs Lab 11/27/15 1445  NA 140  K 3.7   CL 105  CO2 21*  GLUCOSE 146*  BUN 9  CREATININE 1.01*  CALCIUM 9.7   GFR: Estimated Creatinine Clearance: 68.8 mL/min (by C-G formula based on SCr of 1.01 mg/dL (H)). Liver Function Tests:  Recent Labs Lab 11/27/15 1445  AST 40  ALT 23  ALKPHOS 55  BILITOT 0.6  PROT 6.9  ALBUMIN 3.5   No results for input(s): LIPASE, AMYLASE in the last 168 hours. No results for input(s): AMMONIA in the last 168 hours. Coagulation Profile:  Recent Labs Lab 11/27/15 1445  INR 1.18   Cardiac Enzymes: No results for input(s): CKTOTAL, CKMB, CKMBINDEX, TROPONINI in the last 168 hours. BNP (last 3 results) No results for input(s): PROBNP in the last 8760 hours. HbA1C: No results for input(s): HGBA1C in the last 72 hours. CBG: No results for input(s): GLUCAP in the last 168 hours. Lipid Profile: No results for input(s): CHOL, HDL, LDLCALC, TRIG, CHOLHDL, LDLDIRECT in the last 72 hours. Thyroid Function Tests:  Recent Labs  11/27/15 1614  TSH 0.023*   Anemia Panel: No results for input(s): VITAMINB12, FOLATE, FERRITIN, TIBC, IRON, RETICCTPCT in the last 72 hours. Urine analysis:    Component Value Date/Time   COLORURINE AMBER (A) 11/27/2015 1608   APPEARANCEUR CLEAR 11/27/2015 1608   LABSPEC 1.029 11/27/2015 1608   PHURINE 6.5 11/27/2015 1608   GLUCOSEU NEGATIVE 11/27/2015 1608   HGBUR NEGATIVE 11/27/2015 1608   HGBUR negative 08/27/2009 1308   BILIRUBINUR SMALL (A) 11/27/2015 1608   BILIRUBINUR Neg 03/24/2013 0958   KETONESUR 15 (A) 11/27/2015 1608   PROTEINUR 30 (A) 11/27/2015 1608   UROBILINOGEN 1.0 11/05/2014 2240   NITRITE NEGATIVE 11/27/2015 1608   LEUKOCYTESUR SMALL (A) 11/27/2015 1608   Sepsis Labs: @LABRCNTIP (procalcitonin:4,lacticidven:4) )No results found for this or any previous visit (from the past 240 hour(s)).   Radiological Exams on Admission: Dg Chest Port 1 View  Result Date: 11/27/2015 CLINICAL DATA:  Fever EXAM: PORTABLE CHEST 1 VIEW  COMPARISON:  November 05, 2014 FINDINGS: The degree of inspiration is shallow. There is atelectatic change in the right upper and lower lobe regions. Lungs elsewhere clear. Heart is upper normal in size with pulmonary vascularity within normal limits. No adenopathy. No bone lesions. IMPRESSION: Shallow degree of inspiration. Patchy atelectasis on the right. No edema or consolidation. Stable cardiac silhouette. Electronically Signed   By: Bretta Bang III M.D.   On: 11/27/2015 16:18    EKG: None  Assessment/Plan  SIRS secondary to unknown  sources -Presentation to the emergency department, patient was febrile, leukocytosis with a lactic acid 5.7, mildly hypotensive -Sepsis protocol initiated emergency department -BP has not responded to fluids -She was given vancomycin and Zosyn in the emergency department -Will continue vancomycin and cefepime -Urinalysis unremarkable, chest x-ray unremarkable for infection -Patient did have some GI symptoms recently -Blood cultures pending  Diarrhea, possible gastroenteritis -C. Difficile and GI pathogen panel pending -Patient recently completed a course of clindamycin for tooth infection -Of note patient does have a Flagyl allergy -Will not start oral vancomycin at this time  Hypothyroidism -Husband is not sure if patient's current Synthroid dose. States that she has not been on Synthroid for 3 weeks now. There seems to be some confusion as to instructions regarding patient's Synthroid medication. -Patient sees Dr. Lucianne Muss, last PN states dose of synthroid 100ug -Will restart Synthroid -If possible, try to reach Dr. Lucianne Muss for further instructions regarding patient's hypothyroidism and Addison's disease. -TSH 0.023, FT4 pending  Addison's disease -Patient was told to hold Solu-Cortef which she has been doing for the last several weeks.  Seizure disorder -Related to brain cancer -Patient does take Keppra, level pending -Patient follows up with a  [facility]urosurgeon [facility]. -Per husband, patient was noted to be staring off into space and altered this morning while having a bowel movement. Felt patient had a seizure. -Will place on seizure precautions  History of brain cancer -1993, 2005 -As previously mentioned patient does follow with neurosurgery [facility].   Thrombocytosis -Possibly reactive, continue to monitor  Generalized weakness -Likely multifactorial including possible sepsis versus hypothyroidism and Addison's disease. As noted above, patient has not been on her medications last several weeks. -Continue supportive care at this time.  DVT prophylaxis: lovenox  Code Status: Full  Family Communication: Husband at bedside. Admission, patients condition and plan of care including tests being ordered have been discussed with the patient and husband who indicate understanding and agree with the plan and Code Status.  Disposition Plan: Admitted. Home when stable   Consults called: None   Admission status: Inpatient, stepdown   Time spent: 70 minutes  Jaylnn Ullery D.O. [facility] Pager [phone]  If 7PM-7AM, please contact night-coverage [email or web address] Password [facility] 11/27/2015, 6:23 PM

## 2015-11-27 NOTE — Telephone Encounter (Signed)
Pt husband called in and said that he sent a MyChart message and would like if someone would get back to him.

## 2015-11-27 NOTE — ED Triage Notes (Signed)
Per PT's Husband, Pt is coming from home with weakness, diarrhea, and wheezing that started yesterday. Pt has Hx of cancer and is being cared for at home by her husband. Upon arrival to the ED, Pt is diaphoretic, weak and unable to handle transfer from wheelchair like she is at home.

## 2015-11-27 NOTE — ED Notes (Signed)
Attempted report to floor x 1 

## 2015-11-27 NOTE — Telephone Encounter (Signed)
Pt husband calling back again to speak with someone.

## 2015-11-27 NOTE — Telephone Encounter (Signed)
Patient is calling on the status of patient medication.

## 2015-11-28 ENCOUNTER — Inpatient Hospital Stay (HOSPITAL_COMMUNITY): Payer: 59

## 2015-11-28 ENCOUNTER — Encounter (HOSPITAL_COMMUNITY): Payer: Self-pay | Admitting: Radiology

## 2015-11-28 DIAGNOSIS — G40909 Epilepsy, unspecified, not intractable, without status epilepticus: Secondary | ICD-10-CM

## 2015-11-28 DIAGNOSIS — I2699 Other pulmonary embolism without acute cor pulmonale: Secondary | ICD-10-CM

## 2015-11-28 DIAGNOSIS — J9601 Acute respiratory failure with hypoxia: Secondary | ICD-10-CM

## 2015-11-28 DIAGNOSIS — E038 Other specified hypothyroidism: Secondary | ICD-10-CM

## 2015-11-28 DIAGNOSIS — C711 Malignant neoplasm of frontal lobe: Secondary | ICD-10-CM

## 2015-11-28 LAB — CBC
HEMATOCRIT: 32.1 % — AB (ref 36.0–46.0)
Hemoglobin: 9.6 g/dL — ABNORMAL LOW (ref 12.0–15.0)
MCH: 24.2 pg — AB (ref 26.0–34.0)
MCHC: 29.9 g/dL — AB (ref 30.0–36.0)
MCV: 80.9 fL (ref 78.0–100.0)
Platelets: 296 10*3/uL (ref 150–400)
RBC: 3.97 MIL/uL (ref 3.87–5.11)
RDW: 16.7 % — AB (ref 11.5–15.5)
WBC: 9.7 10*3/uL (ref 4.0–10.5)

## 2015-11-28 LAB — BASIC METABOLIC PANEL
Anion gap: 7 (ref 5–15)
BUN: 9 mg/dL (ref 6–20)
CALCIUM: 8.1 mg/dL — AB (ref 8.9–10.3)
CHLORIDE: 110 mmol/L (ref 101–111)
CO2: 24 mmol/L (ref 22–32)
CREATININE: 0.73 mg/dL (ref 0.44–1.00)
GFR calc non Af Amer: >60 mL/min (ref >60)
Glucose, Bld: 162 mg/dL — ABNORMAL HIGH (ref 65–99)
Potassium: 3.8 mmol/L (ref 3.5–5.1)
Sodium: 141 mmol/L (ref 135–145)

## 2015-11-28 LAB — T4, FREE: FREE T4: 1.12 ng/dL (ref 0.61–1.12)

## 2015-11-28 LAB — D-DIMER, QUANTITATIVE (NOT AT ARMC): D DIMER QUANT: 1.43 ug{FEU}/mL — AB (ref 0.00–0.50)

## 2015-11-28 LAB — PROCALCITONIN: Procalcitonin: 0.66 ng/mL

## 2015-11-28 LAB — LACTIC ACID, PLASMA: Lactic Acid, Venous: 1.3 mmol/L (ref 0.5–1.9)

## 2015-11-28 LAB — MRSA PCR SCREENING: MRSA by PCR: NEGATIVE

## 2015-11-28 MED ORDER — IOPAMIDOL (ISOVUE-370) INJECTION 76%
INTRAVENOUS | Status: AC
  Administered 2015-11-28: 80 mL
  Filled 2015-11-28: qty 100

## 2015-11-28 MED ORDER — HEPARIN (PORCINE) IN NACL 100-0.45 UNIT/ML-% IJ SOLN
1600.0000 [IU]/h | INTRAMUSCULAR | Status: DC
  Administered 2015-11-28: 1350 [IU]/h via INTRAVENOUS
  Filled 2015-11-28: qty 250

## 2015-11-28 MED ORDER — IPRATROPIUM-ALBUTEROL 0.5-2.5 (3) MG/3ML IN SOLN
3.0000 mL | Freq: Three times a day (TID) | RESPIRATORY_TRACT | Status: DC
  Administered 2015-11-28: 3 mL via RESPIRATORY_TRACT
  Filled 2015-11-28: qty 3

## 2015-11-28 MED ORDER — HEPARIN BOLUS VIA INFUSION
2000.0000 [IU] | Freq: Once | INTRAVENOUS | Status: AC
  Administered 2015-11-28: 2000 [IU] via INTRAVENOUS
  Filled 2015-11-28: qty 2000

## 2015-11-28 MED ORDER — IPRATROPIUM-ALBUTEROL 0.5-2.5 (3) MG/3ML IN SOLN
3.0000 mL | Freq: Three times a day (TID) | RESPIRATORY_TRACT | Status: DC
  Administered 2015-11-29 – 2015-11-30 (×4): 3 mL via RESPIRATORY_TRACT
  Filled 2015-11-28 (×5): qty 3

## 2015-11-28 NOTE — Progress Notes (Signed)
Discussed Advance Directives with patient. Per patient and husband, [name], they have an estate which also has a living will and health care power of attorney outside of the hospital.

## 2015-11-28 NOTE — Progress Notes (Signed)
CTA chest shows possible LUL filling defects in pulm artery although the study was suboptimal.  In the setting of new tachycardia, hypoxia and dyspnea in pt that is very immobile, plan to treat as PE.  Check D-dimer, LE dopplers, start IV heparin, ordered Echo.  Called Husband--no answer--left voicemail for call back.  Patient also updated  [name]

## 2015-11-28 NOTE — Consult Note (Signed)
Reason for Consult: possible seizure Referring Physician: hospitalist  [name] is an 60 y.o. female.  HPI: Patient provides the history along with chart review.  She had an episode of loss of contact/awareness at home while sitting on a bedside commode in her room.  The event was witnessed by her husband who stated she was not talking to her for about a minute.  She does have a h/o left temporal lobe surgery for brain tumor.  She has been on Keppra for this and has not had any seizure in years.  She is compliant with her meds.  Since coming in she has had an EEG and the results are pending.   She is scheduled to have an MRI brain.  She was also found to have a presumed PE and is being treated for this.  Past Medical History:  Diagnosis Date  . Addison's disease ([place])   . Anemia 03/06/2013  . Bladder spasm   . Brain cancer ([place])    Frontal lobe, 1993 and 2005  . Fibromyalgia   . GERD (gastroesophageal reflux disease)   . Hyperchloremia   . Hypertension   . 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 Migraines   . Morton neuroma   . Right fibular fracture 07/12/2011  . 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 Seizures ([place])   . Stroke [facility])     Past Surgical History:  Procedure Laterality Date  . APPENDECTOMY  1976  . brain cancer     1993 and 2005  . CHOLECYSTECTOMY  08/02/2013   Procedure: LAPAROSCOPIC CHOLECYSTECTOMY;  Surgeon: Gwenyth Ober, MD;  Location: [facility] OR;  Service: General;;  . ESOPHAGOGASTRODUODENOSCOPY N/A 12/28/2013   Procedure: ESOPHAGOGASTRODUODENOSCOPY (EGD);  Surgeon: Inda Castle, MD;  Location: [place];  Service: Endoscopy;  Laterality: N/A;  . EXCISION MORTON'S NEUROMA     right foot  . ORIF ANKLE FRACTURE  07/12/2011   Procedure: OPEN REDUCTION INTERNAL FIXATION (ORIF) ANKLE FRACTURE;  Surgeon: Johnny Bridge, MD;  Location: [place];  Service: Orthopedics;  Laterality: Right;  .  SAVORY DILATION N/A 12/28/2013   Procedure: SAVORY DILATION;  Surgeon: Inda Castle, MD;  Location: [place];  Service: Endoscopy;  Laterality: N/A;    Family History  Problem Relation Age of Onset  . Diabetes Mother   . Hypertension Mother   . Hypertension Father   . Diabetes Brother     Social History:  reports that she has never smoked. She has never used smokeless tobacco. She reports that she does not drink alcohol or use drugs.  Allergies:  Allergies  Allergen Reactions  . Versed [Midazolam] Other (See Comments) and Anaphylaxis    Severe respiratory depression, had to be masked in preop holding.  . Ciprofloxacin Nausea And Vomiting  . Cyclobenzaprine Itching    Other reaction(s): Itching / Pruritis (ALLERGY/intolerance)  . Methocarbamol Itching    REACTION: itching   . Metronidazole Other (See Comments)    Unknown   . Penicillins Rash    Medications: I have reviewed the patient's current medications.  Results for orders placed or performed during the hospital encounter of 11/27/15 (from the past 48 hour(s))  Culture, blood (Routine x 2)     Status: None (Preliminary result)   Collection Time: 11/27/15  2:42 PM  Result Value Ref Range   Specimen Description BLOOD RIGHT WRIST    Special Requests  BOTTLES DRAWN AEROBIC AND ANAEROBIC 5CC    Culture NO GROWTH < 24 HOURS    Report Status PENDING   Comprehensive metabolic panel     Status: Abnormal   Collection Time: 11/27/15  2:45 PM  Result Value Ref Range   Sodium 140 135 - 145 mmol/L   Potassium 3.7 3.5 - 5.1 mmol/L   Chloride 105 101 - 111 mmol/L   CO2 21 (L) 22 - 32 mmol/L   Glucose, Bld 146 (H) 65 - 99 mg/dL   BUN 9 6 - 20 mg/dL   Creatinine, Ser 1.01 (H) 0.44 - 1.00 mg/dL   Calcium 9.7 8.9 - 10.3 mg/dL   Total Protein 6.9 6.5 - 8.1 g/dL   Albumin 3.5 3.5 - 5.0 g/dL   AST 40 15 - 41 U/L   ALT 23 14 - 54 U/L   Alkaline Phosphatase 55 38 - 126 U/L   Total Bilirubin 0.6 0.3 - 1.2 mg/dL   GFR calc non  Af Amer >60 >60 mL/min   GFR calc Af Amer >60 >60 mL/min    Comment: (NOTE) The eGFR has been calculated using the CKD EPI equation. This calculation has not been validated in all clinical situations. eGFR's persistently <60 mL/min signify possible Chronic Kidney Disease.    Anion gap 14 5 - 15  CBC with Differential     Status: Abnormal   Collection Time: 11/27/15  2:45 PM  Result Value Ref Range   WBC 13.9 (H) 4.0 - 10.5 K/uL   RBC 4.94 3.87 - 5.11 MIL/uL   Hemoglobin 12.1 12.0 - 15.0 g/dL   HCT 39.9 36.0 - 46.0 %   MCV 80.8 78.0 - 100.0 fL   MCH 24.5 (L) 26.0 - 34.0 pg   MCHC 30.3 30.0 - 36.0 g/dL   RDW 17.0 (H) 11.5 - 15.5 %   Platelets 484 (H) 150 - 400 K/uL   Neutrophils Relative % 70 %   Neutro Abs 9.7 (H) 1.7 - 7.7 K/uL   Lymphocytes Relative 20 %   Lymphs Abs 2.8 0.7 - 4.0 K/uL   Monocytes Relative 8 %   Monocytes Absolute 1.1 (H) 0.1 - 1.0 K/uL   Eosinophils Relative 2 %   Eosinophils Absolute 0.2 0.0 - 0.7 K/uL   Basophils Relative 0 %   Basophils Absolute 0.1 0.0 - 0.1 K/uL  Protime-INR     Status: None   Collection Time: 11/27/15  2:45 PM  Result Value Ref Range   Prothrombin Time 15.0 11.4 - 15.2 seconds   INR 1.18   I-Stat CG4 Lactic Acid, ED     Status: Abnormal   Collection Time: 11/27/15  2:51 PM  Result Value Ref Range   Lactic Acid, Venous 5.73 (HH) 0.5 - 1.9 mmol/L   Comment NOTIFIED PHYSICIAN   Culture, blood (Routine x 2)     Status: None (Preliminary result)   Collection Time: 11/27/15  3:43 PM  Result Value Ref Range   Specimen Description BLOOD RIGHT WRIST    Special Requests BOTTLES DRAWN AEROBIC AND ANAEROBIC 5CC    Culture NO GROWTH < 24 HOURS    Report Status PENDING   Urine culture     Status: None (Preliminary result)   Collection Time: 11/27/15  4:05 PM  Result Value Ref Range   Specimen Description URINE, CATHETERIZED    Special Requests NONE    Culture CULTURE REINCUBATED FOR BETTER GROWTH    Report Status PENDING   Urinalysis,  Routine w  reflex microscopic     Status: Abnormal   Collection Time: 11/27/15  4:08 PM  Result Value Ref Range   Color, Urine AMBER (A) YELLOW    Comment: BIOCHEMICALS MAY BE AFFECTED BY COLOR   APPearance CLEAR CLEAR   Specific Gravity, Urine 1.029 1.005 - 1.030   pH 6.5 5.0 - 8.0   Glucose, UA NEGATIVE NEGATIVE mg/dL   Hgb urine dipstick NEGATIVE NEGATIVE   Bilirubin Urine SMALL (A) NEGATIVE   Ketones, ur 15 (A) NEGATIVE mg/dL   Protein, ur 30 (A) NEGATIVE mg/dL   Nitrite NEGATIVE NEGATIVE   Leukocytes, UA SMALL (A) NEGATIVE  Urine microscopic-add on     Status: Abnormal   Collection Time: 11/27/15  4:08 PM  Result Value Ref Range   Squamous Epithelial / LPF 0-5 (A) NONE SEEN   WBC, UA 0-5 0 - 5 WBC/hpf   RBC / HPF NONE SEEN 0 - 5 RBC/hpf   Bacteria, UA FEW (A) NONE SEEN   Urine-Other MUCOUS PRESENT   TSH     Status: Abnormal   Collection Time: 11/27/15  4:14 PM  Result Value Ref Range   TSH 0.023 (L) 0.350 - 4.500 uIU/mL    Comment: Performed by a 3rd Generation assay with a functional sensitivity of <=0.01 uIU/mL.  I-Stat CG4 Lactic Acid, ED     Status: Abnormal   Collection Time: 11/27/15  6:14 PM  Result Value Ref Range   Lactic Acid, Venous 3.48 (HH) 0.5 - 1.9 mmol/L   Comment NOTIFIED PHYSICIAN   MRSA PCR Screening     Status: None   Collection Time: 11/27/15  9:39 PM  Result Value Ref Range   MRSA by PCR NEGATIVE NEGATIVE    Comment:        The GeneXpert MRSA Assay (FDA approved for NASAL specimens only), is one component of a comprehensive MRSA colonization surveillance program. It is not intended to diagnose MRSA infection nor to guide or monitor treatment for MRSA infections.   CBC with Differential     Status: Abnormal   Collection Time: 11/27/15 10:03 PM  Result Value Ref Range   WBC 9.5 4.0 - 10.5 K/uL   RBC 4.10 3.87 - 5.11 MIL/uL   Hemoglobin 9.8 (L) 12.0 - 15.0 g/dL   HCT 33.2 (L) 36.0 - 46.0 %   MCV 81.0 78.0 - 100.0 fL   MCH 23.9 (L) 26.0 -  34.0 pg   MCHC 29.5 (L) 30.0 - 36.0 g/dL   RDW 16.8 (H) 11.5 - 15.5 %   Platelets 291 150 - 400 K/uL   Neutrophils Relative % 91 %   Neutro Abs 8.6 (H) 1.7 - 7.7 K/uL   Lymphocytes Relative 7 %   Lymphs Abs 0.7 0.7 - 4.0 K/uL   Monocytes Relative 2 %   Monocytes Absolute 0.2 0.1 - 1.0 K/uL   Eosinophils Relative 0 %   Eosinophils Absolute 0.0 0.0 - 0.7 K/uL   Basophils Relative 0 %   Basophils Absolute 0.0 0.0 - 0.1 K/uL  Lactic acid, plasma     Status: None   Collection Time: 11/27/15 10:03 PM  Result Value Ref Range   Lactic Acid, Venous 1.3 0.5 - 1.9 mmol/L  Procalcitonin     Status: None   Collection Time: 11/27/15 10:03 PM  Result Value Ref Range   Procalcitonin 0.49 ng/mL    Comment:        Interpretation: PCT (Procalcitonin) <= 0.5 ng/mL: Systemic infection (sepsis) is not likely.  Local bacterial infection is possible. (NOTE)         ICU PCT Algorithm               Non ICU PCT Algorithm    ----------------------------     ------------------------------         PCT < 0.25 ng/mL                 PCT < 0.1 ng/mL     Stopping of antibiotics            Stopping of antibiotics       strongly encouraged.               strongly encouraged.    ----------------------------     ------------------------------       PCT level decrease by               PCT < 0.25 ng/mL       >= 80% from peak PCT       OR PCT 0.25 - 0.5 ng/mL          Stopping of antibiotics                                             encouraged.     Stopping of antibiotics           encouraged.    ----------------------------     ------------------------------       PCT level decrease by              PCT >= 0.25 ng/mL       < 80% from peak PCT        AND PCT >= 0.5 ng/mL            Continuin g antibiotics                                              encouraged.       Continuing antibiotics            encouraged.    ----------------------------     ------------------------------     PCT level increase compared           PCT > 0.5 ng/mL         with peak PCT AND          PCT >= 0.5 ng/mL             Escalation of antibiotics                                          strongly encouraged.      Escalation of antibiotics        strongly encouraged.   Protime-INR     Status: Abnormal   Collection Time: 11/27/15 10:03 PM  Result Value Ref Range   Prothrombin Time 15.7 (H) 11.4 - 15.2 seconds   INR 1.24   APTT     Status: Abnormal   Collection Time: 11/27/15 10:03 PM  Result Value Ref Range   aPTT 37 (H) 24 - 36 seconds    Comment:  IF BASELINE aPTT IS ELEVATED, SUGGEST PATIENT RISK ASSESSMENT BE USED TO DETERMINE APPROPRIATE ANTICOAGULANT THERAPY.   T4, free     Status: None   Collection Time: 11/28/15 12:45 AM  Result Value Ref Range   Free T4 1.12 0.61 - 1.12 ng/dL    Comment: (NOTE) Biotin ingestion may interfere with free T4 tests. If the results are inconsistent with the TSH level, previous test results, or the clinical presentation, then consider biotin interference. If needed, order repeat testing after stopping biotin.   Lactic acid, plasma     Status: None   Collection Time: 11/28/15 12:45 AM  Result Value Ref Range   Lactic Acid, Venous 1.3 0.5 - 1.9 mmol/L  CBC     Status: Abnormal   Collection Time: 11/28/15 12:45 AM  Result Value Ref Range   WBC 9.7 4.0 - 10.5 K/uL   RBC 3.97 3.87 - 5.11 MIL/uL   Hemoglobin 9.6 (L) 12.0 - 15.0 g/dL   HCT 32.1 (L) 36.0 - 46.0 %   MCV 80.9 78.0 - 100.0 fL   MCH 24.2 (L) 26.0 - 34.0 pg   MCHC 29.9 (L) 30.0 - 36.0 g/dL   RDW 16.7 (H) 11.5 - 15.5 %   Platelets 296 150 - 400 K/uL  Basic metabolic panel     Status: Abnormal   Collection Time: 11/28/15 12:45 AM  Result Value Ref Range   Sodium 141 135 - 145 mmol/L   Potassium 3.8 3.5 - 5.1 mmol/L   Chloride 110 101 - 111 mmol/L   CO2 24 22 - 32 mmol/L   Glucose, Bld 162 (H) 65 - 99 mg/dL   BUN 9 6 - 20 mg/dL   Creatinine, Ser 0.73 0.44 - 1.00 mg/dL   Calcium 8.1 (L) 8.9 - 10.3  mg/dL   GFR calc non Af Amer >60 >60 mL/min   GFR calc Af Amer >60 >60 mL/min    Comment: (NOTE) The eGFR has been calculated using the CKD EPI equation. This calculation has not been validated in all clinical situations. eGFR's persistently <60 mL/min signify possible Chronic Kidney Disease.    Anion gap 7 5 - 15    Ct Angio Chest Pe W Or Wo Contrast  Result Date: 11/28/2015 CLINICAL DATA:  Shortness of breath for 3 weeks worse in the last few days EXAM: CT ANGIOGRAPHY CHEST WITH CONTRAST TECHNIQUE: Multidetector CT imaging of the chest was performed using the standard protocol during bolus administration of intravenous contrast. Multiplanar CT image reconstructions and MIPs were obtained to evaluate the vascular anatomy. CONTRAST:  80 mL Isovue 370 intravenous COMPARISON:  Chest x-ray 11/28/2015 FINDINGS: Cardiovascular: Suboptimal contrast bolus for evaluation of pulmonary embolus. No gross filling defects within the main pulmonary arteries. There are no obvious filling defects within the central segmental pulmonary arteries on the right. Questionable small filling defects within left upper lobe segmental pulmonary artery, series 4, image number 31. Assessment of RV LV ratio limited due to imaging planes. This is estimated to be less than 0.9. Aorta demonstrates no evidence for dissection. Minimal calcification is present. Heart is upper normal in size. No pericardial effusion. Mediastinum/Nodes: Thyroid gland within normal limits. Prominent mediastinal fat. Sub cm lymph nodes within the mediastinum. No axillary adenopathy. Mild diffuse air distention of the esophagus. Lungs/Pleura: Bilateral ground-glass densities within the lung fields. Bandlike atelectasis in the right upper lobe. No significant pleural effusion. Upper Abdomen: No acute findings in the upper abdomen. Probable pill fragment in the distal stomach. Musculoskeletal:  No acute osseous abnormality. Review of the MIP images confirms the  above findings. IMPRESSION: 1. Limited examination secondary to suboptimal contrast bolus and poor opacification of the pulmonary arteries. Possible small filling defect within segmental left upper lobe pulmonary artery, small thrombus cannot be excluded. No definite evidence for right heart strain. 2. Bilateral scattered areas of ground-glass density, could relate to edema, infection, or small airways disease. Bandlike atelectasis in the right upper lobe. No pleural effusion. These results will be called to the ordering clinician or representative by the Radiologist Assistant, and communication documented in the PACS or zVision Dashboard. Electronically Signed   By: Donavan Foil M.D.   On: 11/28/2015 15:31   Dg Chest Port 1 View  Result Date: 11/28/2015 CLINICAL DATA:  Sepsis, followup EXAM: PORTABLE CHEST 1 VIEW COMPARISON:  Portable chest x-ray of 11/27/2015 FINDINGS: Again the lungs are poorly aerated although aeration has improved slightly. Mild left basilar linear atelectasis is present. No effusion is noted. Mediastinal and hilar contours are unchanged and the heart is within normal limits in size. No bony abnormality is seen. IMPRESSION: Slightly improved aeration.  Mild left basilar linear atelectasis. Electronically Signed   By: Ivar Drape M.D.   On: 11/28/2015 08:07   Dg Chest Port 1 View  Result Date: 11/27/2015 CLINICAL DATA:  Fever EXAM: PORTABLE CHEST 1 VIEW COMPARISON:  November 05, 2014 FINDINGS: The degree of inspiration is shallow. There is atelectatic change in the right upper and lower lobe regions. Lungs elsewhere clear. Heart is upper normal in size with pulmonary vascularity within normal limits. No adenopathy. No bone lesions. IMPRESSION: Shallow degree of inspiration. Patchy atelectasis on the right. No edema or consolidation. Stable cardiac silhouette. Electronically Signed   By: Lowella Grip III M.D.   On: 11/27/2015 16:18    ROS  Denies headache, dizziness or  weakness  Blood pressure (!) 147/82, pulse 99, temperature 98.9 F (37.2 C), temperature source Oral, resp. rate 19, height _0  (1.575 m), weight 109.1 kg (240 lb 8 oz), SpO2 97 %. Physical Exam MS - normal CN - EOMI, face symmetric, no dysarthria Motor - has 5/5 in all 4 limbs Sensory - no extinction coor - no dysmetria DTR - symmetric Station/gait - not tested  Assessment/Plan: 1. Transient loss of awareness Doubt this was a breakthrough seizure.  Likely related to the PE that was found.  Await EEG and MRI results to exclude seizure.  For now would continue current home dosing of Keppra.   Doren Custard, MD Neurology 11/28/2015, 5:35 PM

## 2015-11-28 NOTE — Progress Notes (Signed)
Text page to MD with notification of CT scan impression.

## 2015-11-28 NOTE — Progress Notes (Signed)
Pt unavailable for EEG per RN she is going to have an MRI and a CT. Will re-attempt as schedule permits.

## 2015-11-28 NOTE — Progress Notes (Signed)
PROGRESS NOTE  [name] QIH:[id number] DOB: 1955/02/15 DOA: 11/27/2015 PCP: Neena Rhymes, MD  Brief History:   60 y.o. female with a medical history of Addison's disease, brain cancer, seizure disorder, hypothyroidism, presented to the emergency department with complaints of generalized weakness, fatigue, and diarrhea. Patient stated that she started having diarrhea early am 11/26/15 and feeling upset her stomach. Per husband, at approximately 1 AM this morning, patient was having a bowel movement, when he noticed that she started to stare off into space and was unresponsive to him. Episode lasted approximately 60 seconds. Husband states the patient has had seizures related to her brain cancer however since removal the seizures have improved. Patient iss on generic Keppra. Notably, the patient has been off of her hydrocortisone and levothyroxine for the past 3 weeks due to some communication mixup with her endocrinologist. In the emergency department, the patient was noted to have a temperature up to 101.17F with tachycardia and soft blood pressures of 95/56. The patient was started on intravenous vancomycin and cefepime and IV fluids.  Assessment/Plan: SIRS -suspect adrenal insufficiency may be contributing to much of this picture as she has been off her usual hydrocortisone -Lactic acid. 5.7 -Continue IV fluids -Initially received vancomycin and Zosyn in the emergency department -Continue vancomycin and cefepime pending culture data -Urinalysis negative for pyuria -Chest x-ray negative for consolidation  Diarrhea -Suspect that this may be related to the patient's recent Maalox use -She normally suffers with constipation -Has not had a bowel movement since admission  Acute respiratory failure with hypoxia -Secondary to pulmonary embolus -11/28/2015 CT intragraft chest--possible left upper lobe pulmonary artery filling defects, with scattered groundglass  opacities -Procalcitonin -Start IV heparin -Check influenza PCR  Addison's disease -Case was discussed with the patient's endocrinologist, Dr. Milagros Reap was supposed to continue on levothyroxine and continue morning dose of hydrocortisone -plan to d/c with Cortef 30 mg daily -Restart levothyroxine after discussion with Dr. Lucianne Muss  Seizure disorder -Consult and neurology -Continue Keppra -EEG -MRI brain per neurology request -no seizure in 2 years  History of ganglioglioma -follows neurology at [facility] -History of resection in 1993 with left temporal lobe resection 2004 -whole brain radiation 2006  Generalized weakness -Likely multifactorial including possible sepsis versus hypothyroidism and Addison's disease. As noted above, patient has not been on her medications last several weeks. -Continue supportive care at this time.     Disposition Plan:   Home in 2-3 days  Family Communication:   Husband updated at bedside--Total time spent 35 minutes.  Greater than 50% spent face to face counseling and coordinating care.   Consultants:  neurology  Code Status:  FULL   DVT Prophylaxis:  -IV heparin   Procedures: As Listed in Progress Note Above  Antibiotics: None    Subjective: Patient complains of chronic daily headache. It is bifrontal in nature. She denies any fevers, chills, chest pain, nausea, vomiting, diarrhea. She complains of some shortness of breath with nonproductive cough. No hemoptysis.  Objective: Vitals:   11/28/15 0835 11/28/15 1138 11/28/15 1650 11/28/15 1743  BP:  104/73 (!) 147/82   Pulse:  (!) 108 99   Resp:  (!) 22 19   Temp:  98.5 F (36.9 C) 98.9 F (37.2 C)   TempSrc:  Axillary Oral   SpO2:  97%  98%  Weight: 109.1 kg (240 lb 8 oz)     Height: 5\' 2"  (1.575 m)  Intake/Output Summary (Last 24 hours) at 11/28/15 1802 Last data filed at 11/28/15 0835  Gross per 24 hour  Intake           443.75 ml  Output                0 ml   Net           443.75 ml   Weight change:  Exam:   General:  Pt is alert, follows commands appropriately, not in acute distress  HEENT: No icterus, No thrush, No neck mass, [name]/AT  Cardiovascular: RRR, S1/S2, no rubs, no gallops  Respiratory: Bibasilar rales. Minimal bibasilar wheeze. Good air movement.   Abdomen: Soft/+BS, non tender, non distended, no guarding  Extremities:  1 + LE edema, No lymphangitis, No petechiae, No rashes, no synovitis   Data Reviewed: I have personally reviewed following labs and imaging studies Basic Metabolic Panel:  Recent Labs Lab 11/27/15 1445 11/28/15 0045  NA 140 141  K 3.7 3.8  CL 105 110  CO2 21* 24  GLUCOSE 146* 162*  BUN 9 9  CREATININE 1.01* 0.73  CALCIUM 9.7 8.1*   Liver Function Tests:  Recent Labs Lab 11/27/15 1445  AST 40  ALT 23  ALKPHOS 55  BILITOT 0.6  PROT 6.9  ALBUMIN 3.5   No results for input(s): LIPASE, AMYLASE in the last 168 hours. No results for input(s): AMMONIA in the last 168 hours. Coagulation Profile:  Recent Labs Lab 11/27/15 1445 11/27/15 2203  INR 1.18 1.24   CBC:  Recent Labs Lab 11/27/15 1445 11/27/15 2203 11/28/15 0045  WBC 13.9* 9.5 9.7  NEUTROABS 9.7* 8.6*  --   HGB 12.1 9.8* 9.6*  HCT 39.9 33.2* 32.1*  MCV 80.8 81.0 80.9  PLT 484* 291 296   Cardiac Enzymes: No results for input(s): CKTOTAL, CKMB, CKMBINDEX, TROPONINI in the last 168 hours. BNP: Invalid input(s): POCBNP CBG: No results for input(s): GLUCAP in the last 168 hours. HbA1C: No results for input(s): HGBA1C in the last 72 hours. Urine analysis:    Component Value Date/Time   COLORURINE AMBER (A) 11/27/2015 1608   APPEARANCEUR CLEAR 11/27/2015 1608   LABSPEC 1.029 11/27/2015 1608   PHURINE 6.5 11/27/2015 1608   GLUCOSEU NEGATIVE 11/27/2015 1608   HGBUR NEGATIVE 11/27/2015 1608   HGBUR negative 08/27/2009 1308   BILIRUBINUR SMALL (A) 11/27/2015 1608   BILIRUBINUR Neg 03/24/2013 0958   KETONESUR 15 (A)  11/27/2015 1608   PROTEINUR 30 (A) 11/27/2015 1608   UROBILINOGEN 1.0 11/05/2014 2240   NITRITE NEGATIVE 11/27/2015 1608   LEUKOCYTESUR SMALL (A) 11/27/2015 1608   Sepsis Labs: @LABRCNTIP (procalcitonin:4,lacticidven:4) ) Recent Results (from the past 240 hour(s))  Culture, blood (Routine x 2)     Status: None (Preliminary result)   Collection Time: 11/27/15  2:42 PM  Result Value Ref Range Status   Specimen Description BLOOD RIGHT WRIST  Final   Special Requests BOTTLES DRAWN AEROBIC AND ANAEROBIC 5CC  Final   Culture NO GROWTH < 24 HOURS  Final   Report Status PENDING  Incomplete  Culture, blood (Routine x 2)     Status: None (Preliminary result)   Collection Time: 11/27/15  3:43 PM  Result Value Ref Range Status   Specimen Description BLOOD RIGHT WRIST  Final   Special Requests BOTTLES DRAWN AEROBIC AND ANAEROBIC 5CC  Final   Culture NO GROWTH < 24 HOURS  Final   Report Status PENDING  Incomplete  Urine culture  Status: None (Preliminary result)   Collection Time: 11/27/15  4:05 PM  Result Value Ref Range Status   Specimen Description URINE, CATHETERIZED  Final   Special Requests NONE  Final   Culture CULTURE REINCUBATED FOR BETTER GROWTH  Final   Report Status PENDING  Incomplete  MRSA PCR Screening     Status: None   Collection Time: 11/27/15  9:39 PM  Result Value Ref Range Status   MRSA by PCR NEGATIVE NEGATIVE Final    Comment:        The GeneXpert MRSA Assay (FDA approved for NASAL specimens only), is one component of a comprehensive MRSA colonization surveillance program. It is not intended to diagnose MRSA infection nor to guide or monitor treatment for MRSA infections.      Scheduled Meds: . aspirin EC  81 mg Oral Daily  . ceFEPime (MAXIPIME) IV  2 g Intravenous Q12H  . DULoxetine  60 mg Oral Daily  . fenofibrate  160 mg Oral Daily  . fesoterodine  8 mg Oral Daily  . heparin  2,000 Units Intravenous Once  . ipratropium-albuterol  3 mL Nebulization  Q8H  . levETIRAcetam  1,500 mg Oral BID  . pantoprazole  40 mg Oral Daily  . pregabalin  150 mg Oral BID  . rosuvastatin  10 mg Oral Q M,W,F  . vancomycin  1,250 mg Intravenous Q24H   Continuous Infusions: . sodium chloride 75 mL/hr at 11/27/15 2316  . heparin      Procedures/Studies: Ct Angio Chest Pe W Or Wo Contrast  Result Date: 11/28/2015 CLINICAL DATA:  Shortness of breath for 3 weeks worse in the last few days EXAM: CT ANGIOGRAPHY CHEST WITH CONTRAST TECHNIQUE: Multidetector CT imaging of the chest was performed using the standard protocol during bolus administration of intravenous contrast. Multiplanar CT image reconstructions and MIPs were obtained to evaluate the vascular anatomy. CONTRAST:  80 mL Isovue 370 intravenous COMPARISON:  Chest x-ray 11/28/2015 FINDINGS: Cardiovascular: Suboptimal contrast bolus for evaluation of pulmonary embolus. No gross filling defects within the main pulmonary arteries. There are no obvious filling defects within the central segmental pulmonary arteries on the right. Questionable small filling defects within left upper lobe segmental pulmonary artery, series 4, image number 31. Assessment of RV LV ratio limited due to imaging planes. This is estimated to be less than 0.9. Aorta demonstrates no evidence for dissection. Minimal calcification is present. Heart is upper normal in size. No pericardial effusion. Mediastinum/Nodes: Thyroid gland within normal limits. Prominent mediastinal fat. Sub cm lymph nodes within the mediastinum. No axillary adenopathy. Mild diffuse air distention of the esophagus. Lungs/Pleura: Bilateral ground-glass densities within the lung fields. Bandlike atelectasis in the right upper lobe. No significant pleural effusion. Upper Abdomen: No acute findings in the upper abdomen. Probable pill fragment in the distal stomach. Musculoskeletal: No acute osseous abnormality. Review of the MIP images confirms the above findings. IMPRESSION: 1.  Limited examination secondary to suboptimal contrast bolus and poor opacification of the pulmonary arteries. Possible small filling defect within segmental left upper lobe pulmonary artery, small thrombus cannot be excluded. No definite evidence for right heart strain. 2. Bilateral scattered areas of ground-glass density, could relate to edema, infection, or small airways disease. Bandlike atelectasis in the right upper lobe. No pleural effusion. These results will be called to the ordering clinician or representative by the Radiologist Assistant, and communication documented in the PACS or zVision Dashboard. Electronically Signed   By: Jasmine Pang M.D.   On: 11/28/2015  15:31   Dg Chest Port 1 View  Result Date: 11/28/2015 CLINICAL DATA:  Sepsis, followup EXAM: PORTABLE CHEST 1 VIEW COMPARISON:  Portable chest x-ray of 11/27/2015 FINDINGS: Again the lungs are poorly aerated although aeration has improved slightly. Mild left basilar linear atelectasis is present. No effusion is noted. Mediastinal and hilar contours are unchanged and the heart is within normal limits in size. No bony abnormality is seen. IMPRESSION: Slightly improved aeration.  Mild left basilar linear atelectasis. Electronically Signed   By: Dwyane Dee M.D.   On: 11/28/2015 08:07   Dg Chest Port 1 View  Result Date: 11/27/2015 CLINICAL DATA:  Fever EXAM: PORTABLE CHEST 1 VIEW COMPARISON:  November 05, 2014 FINDINGS: The degree of inspiration is shallow. There is atelectatic change in the right upper and lower lobe regions. Lungs elsewhere clear. Heart is upper normal in size with pulmonary vascularity within normal limits. No adenopathy. No bone lesions. IMPRESSION: Shallow degree of inspiration. Patchy atelectasis on the right. No edema or consolidation. Stable cardiac silhouette. Electronically Signed   By: Bretta Bang III M.D.   On: 11/27/2015 16:18    Davene Jobin, DO  [facility] Pager [phone]  If 7PM-7AM,  please contact night-coverage [email or web address] Password [email or web address] 11/28/2015, 6:02 PM   LOS: 1 day

## 2015-11-28 NOTE — Progress Notes (Signed)
Routine EEG completed, results pending.  Uneventful procedure.

## 2015-11-28 NOTE — Progress Notes (Signed)
Pt returned to 4e. Received report; pt stable bp 146/40 HR 58, NSR

## 2015-11-28 NOTE — Progress Notes (Signed)
ANTICOAGULATION CONSULT NOTE - Initial Consult  Pharmacy Consult for Heparin Indication: pulmonary embolus  Allergies  Allergen Reactions  . Versed [Midazolam] Other (See Comments) and Anaphylaxis    Severe respiratory depression, had to be masked in preop holding.  . Ciprofloxacin Nausea And Vomiting  . Cyclobenzaprine Itching    Other reaction(s): Itching / Pruritis (ALLERGY/intolerance)  . Methocarbamol Itching    REACTION: itching   . Metronidazole Other (See Comments)    Unknown   . Penicillins Rash    Patient Measurements: Height: 5\' 2"  (157.5 cm) Weight: 240 lb 8 oz (109.1 kg) IBW/kg (Calculated) : 50.1 Heparin Dosing Weight: 76.6 kg  Vital Signs: Temp: 98.9 F (37.2 C) (11/22 1650) Temp Source: Oral (11/22 1650) BP: 147/82 (11/22 1650) Pulse Rate: 99 (11/22 1650)  Labs:  Recent Labs  11/27/15 1445 11/27/15 2203 11/28/15 0045  HGB 12.1 9.8* 9.6*  HCT 39.9 33.2* 32.1*  PLT 484* 291 296  APTT  --  37*  --   LABPROT 15.0 15.7*  --   INR 1.18 1.24  --   CREATININE 1.01*  --  0.73    Estimated Creatinine Clearance: 89.2 mL/min (by C-G formula based on SCr of 0.73 mg/dL).   Medical History: Past Medical History:  Diagnosis Date  . Addison's disease ([place])   . Anemia 03/06/2013  . Bladder spasm   . Brain cancer ([place])    Frontal lobe, 1993 and 2005  . Fibromyalgia   . GERD (gastroesophageal reflux disease)   . Hyperchloremia   . Hypertension   . 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 Migraines   . Morton neuroma   . Right fibular fracture 07/12/2011  . 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 Seizures ([place])   . Stroke [facility])     Assessment: [name] is a 67 yoF with a past medical history significant for seizures secondary to ganglioglioma s/p craniotomy in 1993 and 2005 with radiation therapy in 2006. She presented to the ED after a 60-second episode of unresponsiveness while  toileting.   She is tachycardic, hypoxic, and dyspneic. CT angio shows possible small filling defect within left upper lobe pulmonary artery. MD ordered heparin per pharmacy for treatment of possible PE.   CBC stable. No signs of bleeding noted. She received one dose of prophylactic lovenox this am. Will bolus conservatively based on this, and use a higher hourly rate due to her body habitus.   Goal of Therapy:  Heparin level 0.3-0.7 units/ml Monitor platelets by anticoagulation protocol: Yes   Plan:  Heparin 2000 unit bolus Heparin 1350 units/hr 6-hr heparin level Monitor daily heparin level and CBC Monitor for signs/symptoms of bleeding Follow-up D-Dimer, LE dopplers, and ECHO  [name], Broadus John 11/28/2015,5:34 PM

## 2015-11-29 ENCOUNTER — Inpatient Hospital Stay (HOSPITAL_COMMUNITY): Payer: 59

## 2015-11-29 ENCOUNTER — Encounter (HOSPITAL_COMMUNITY): Payer: 59

## 2015-11-29 DIAGNOSIS — R531 Weakness: Secondary | ICD-10-CM

## 2015-11-29 DIAGNOSIS — A419 Sepsis, unspecified organism: Principal | ICD-10-CM

## 2015-11-29 DIAGNOSIS — I2699 Other pulmonary embolism without acute cor pulmonale: Secondary | ICD-10-CM

## 2015-11-29 LAB — BASIC METABOLIC PANEL
ANION GAP: 9 (ref 5–15)
BUN: 6 mg/dL (ref 6–20)
CALCIUM: 8.4 mg/dL — AB (ref 8.9–10.3)
CO2: 23 mmol/L (ref 22–32)
Chloride: 110 mmol/L (ref 101–111)
Creatinine, Ser: 0.81 mg/dL (ref 0.44–1.00)
GFR calc Af Amer: >60 mL/min (ref >60)
GLUCOSE: 107 mg/dL — AB (ref 65–99)
Potassium: 3.3 mmol/L — ABNORMAL LOW (ref 3.5–5.1)
Sodium: 142 mmol/L (ref 135–145)

## 2015-11-29 LAB — CBC
HCT: 30.6 % — ABNORMAL LOW (ref 36.0–46.0)
HEMOGLOBIN: 9.1 g/dL — AB (ref 12.0–15.0)
MCH: 23.9 pg — ABNORMAL LOW (ref 26.0–34.0)
MCHC: 29.7 g/dL — AB (ref 30.0–36.0)
MCV: 80.5 fL (ref 78.0–100.0)
Platelets: 273 10*3/uL (ref 150–400)
RBC: 3.8 MIL/uL — ABNORMAL LOW (ref 3.87–5.11)
RDW: 17.1 % — AB (ref 11.5–15.5)
WBC: 6.6 10*3/uL (ref 4.0–10.5)

## 2015-11-29 LAB — ECHOCARDIOGRAM COMPLETE
Height: 62 in
Weight: 3894.4 oz

## 2015-11-29 LAB — GLUCOSE, CAPILLARY: GLUCOSE-CAPILLARY: 99 mg/dL (ref 65–99)

## 2015-11-29 LAB — HEPARIN LEVEL (UNFRACTIONATED)
HEPARIN UNFRACTIONATED: 0.72 [IU]/mL — AB (ref 0.30–0.70)
HEPARIN UNFRACTIONATED: 0.55 [IU]/mL (ref 0.30–0.70)
HEPARIN UNFRACTIONATED: 0.25 [IU]/mL — AB (ref 0.30–0.70)

## 2015-11-29 LAB — INFLUENZA PANEL BY PCR (TYPE A & B)
INFLAPCR: NEGATIVE
Influenza B By PCR: NEGATIVE

## 2015-11-29 MED ORDER — PERFLUTREN LIPID MICROSPHERE
1.0000 mL | INTRAVENOUS | Status: AC | PRN
  Administered 2015-11-29: 2 mL via INTRAVENOUS
  Filled 2015-11-29: qty 10

## 2015-11-29 MED ORDER — IPRATROPIUM-ALBUTEROL 0.5-2.5 (3) MG/3ML IN SOLN: 3.0000 mL | RESPIRATORY_TRACT | Status: DC | PRN

## 2015-11-29 MED ORDER — POTASSIUM CHLORIDE CRYS ER 20 MEQ PO TBCR
40.0000 meq | EXTENDED_RELEASE_TABLET | Freq: Once | ORAL | Status: AC
  Administered 2015-11-29: 40 meq via ORAL
  Filled 2015-11-29: qty 2

## 2015-11-29 MED ORDER — HEPARIN BOLUS VIA INFUSION
2000.0000 [IU] | Freq: Once | INTRAVENOUS | Status: AC
  Administered 2015-11-29: 2000 [IU] via INTRAVENOUS
  Filled 2015-11-29: qty 2000

## 2015-11-29 MED ORDER — HEPARIN (PORCINE) IN NACL 100-0.45 UNIT/ML-% IJ SOLN
1500.0000 [IU]/h | INTRAMUSCULAR | Status: AC
  Administered 2015-11-29 – 2015-11-30 (×3): 1500 [IU]/h via INTRAVENOUS
  Filled 2015-11-29 (×2): qty 250

## 2015-11-29 NOTE — Progress Notes (Signed)
ANTICOAGULATION CONSULT NOTE - Follow Up Consult  Pharmacy Consult for heparin Indication: r/o PE  Labs:  Recent Labs  11/27/15 1445 11/27/15 2203 11/28/15 0045 11/29/15 0023  HGB 12.1 9.8* 9.6*  --   HCT 39.9 33.2* 32.1*  --   PLT 484* 291 296  --   APTT  --  37*  --   --   LABPROT 15.0 15.7*  --   --   INR 1.18 1.24  --   --   HEPARINUNFRC  --   --   --  0.25*  CREATININE 1.01*  --  0.73  --     Assessment: 60yo female subtherapeutic on heparin with initial dosing for possible PE; bolus and gtt started late so lab was only ~4hr after bolus.  Goal of Therapy:  Heparin level 0.3-0.7 units/ml   Plan:  Will rebolus with heparin 2000 units and increase gtt by 2-3 units/kg/hr to 1600 units/hr and check level in 6hr.  Wynona Neat, PharmD, BCPS  11/29/2015,1:43 AM

## 2015-11-29 NOTE — Progress Notes (Signed)
  Echocardiogram 2D Echocardiogram with Definity has been performed.  Diamond Nickel 11/29/2015, 4:11 PM

## 2015-11-29 NOTE — Progress Notes (Signed)
[place] [facility] for Heparin Indication: pulmonary embolus  Allergies  Allergen Reactions  . Versed [Midazolam] Other (See Comments) and Anaphylaxis    Severe respiratory depression, had to be masked in preop holding.  . Ciprofloxacin Nausea And Vomiting  . Cyclobenzaprine Itching    Other reaction(s): Itching / Pruritis (ALLERGY/intolerance)  . Methocarbamol Itching    REACTION: itching   . Metronidazole Other (See Comments)    Unknown   . Penicillins Rash    Patient Measurements: Height: 5\' 2"  (157.5 cm) Weight: 243 lb 6.4 oz (110.4 kg) IBW/kg (Calculated) : 50.1 Heparin Dosing Weight: 76.6 kg  Vital Signs: Temp: 98.9 F (37.2 C) (11/23 0800) Temp Source: Oral (11/23 0800) BP: 156/86 (11/23 0800) Pulse Rate: 102 (11/23 0800)  Labs:  Recent Labs  11/27/15 1445 11/27/15 2203 11/28/15 0045 11/29/15 0023 11/29/15 0332 11/29/15 0819  HGB 12.1 9.8* 9.6*  --  9.1*  --   HCT 39.9 33.2* 32.1*  --  30.6*  --   PLT 484* 291 296  --  273  --   APTT  --  37*  --   --   --   --   LABPROT 15.0 15.7*  --   --   --   --   INR 1.18 1.24  --   --   --   --   HEPARINUNFRC  --   --   --  0.25*  --  0.72*  CREATININE 1.01*  --  0.73  --  0.81  --     Estimated Creatinine Clearance: 88.7 mL/min (by C-G formula based on SCr of 0.81 mg/dL).   Medical History: Past Medical History:  Diagnosis Date  . Addison's disease ([place])   . Anemia 03/06/2013  . Bladder spasm   . Brain cancer ([place])    Frontal lobe, 1993 and 2005  . Fibromyalgia   . GERD (gastroesophageal reflux disease)   . Hyperchloremia   . Hypertension   . Malignant neoplasm of frontal lobe of brain ([place]) 09/18/2008   Qualifier: Diagnosis of  By: Birdie Riddle MD, Belenda Cruise    . Migraines   . Morton neuroma   . Right fibular fracture 07/12/2011  . 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 Seizures ([place])   . 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 She presented to the ED after a 60-second episode of unresponsiveness while toileting.   She is tachycardic, hypoxic, and dyspneic. CT angio shows possible small filling defect within left upper lobe pulmonary artery. MD ordered heparin per pharmacy for treatment of possible PE.   CBC stable.  Heparin level slightly above goal after bolus/rate increase overnight.    Goal of Therapy:  Heparin level 0.3-0.7 units/ml Monitor platelets by anticoagulation protocol: Yes   Plan:  Decrease IV heparin to 1500 units/hr. Recheck heparin level in 6 hrs. Monitor daily heparin level and CBC Monitor for signs/symptoms of bleeding F/u plans for oral anticoagulation eventually.  Uvaldo Rising, BCPS  Clinical Pharmacist Pager [phone]  11/29/2015 9:51 AM

## 2015-11-29 NOTE — Progress Notes (Signed)
PROGRESS NOTE  [name] DGU:[id number] DOB: [birth year] DOA: 11/27/2015 PCP: Neena Rhymes, MD Brief History:  60 y.o.femalewith a medical history of Addison's disease, brain cancer, seizure disorder, hypothyroidism, presented to the emergency department with complaints of generalized weakness, fatigue, and diarrhea. Patient stated that she started having diarrhea early am 11/26/15 and feeling upset her stomach. Per husband, at approximately 1 AM this morning, patient was having a bowel movement, when he noticed that she started to stare off into space and was unresponsive to him. Episode lasted approximately 60 seconds. Husband states the patient has had seizures related to her brain cancer however since removal the seizures have improved. Patient iss on generic Keppra. Notably, the patient has been off of her hydrocortisone and levothyroxine for the past 3 weeks due to some communication mixup with her endocrinologist. In the emergency department, the patient was noted to have a temperature up to 101.68F with tachycardia and soft blood pressures of 95/56. The patient was started on intravenous vancomycin and cefepime and IV fluids.  Assessment/Plan: Sepsis -suspect adrenal insufficiency may be contributing to much of this picture as she has been off her usual hydrocortisone -underlying pneumonia also contributing -Lactic acid. 5.7 -Continue IV fluids -Initially received vancomycin and Zosyn in the emergency department -Continue vancomycin and cefepime pending culture data -Urinalysis negative for pyuria -Chest x-ray negative for consolidation -CTA chest with bilateral ground glass opacities  Diarrhea -Suspect that this may be related to the patient's recent Maalox use -She normally suffers with constipation -Has not had a bowel movement since admission  Acute respiratory failure with hypoxia -Secondary to pulmonary embolus -11/28/2015 CTA chest--possible left upper  lobe pulmonary artery filling defects, with scattered groundglass opacities -Procalcitonin 0.49-->0.66 -now stable on RA  Acute pulmonary embolus -11/28/2015 CTA chest--possible left upper lobe pulmonary artery filling defects, with scattered groundglass opacities -started IV heparin 11/22 -plan to ultimately transition to apixaban -given immobility, tachycardia, hypoxia--plan to treat as PE -lower extremity venous duplex  Pulmonary Opacities--lobar pneumonia -pt presented with fever and dyspnea -Procalcitonin 0.49-->0.66 -plan 5 days of abx  Addison's disease -Case was discussed with the patient's endocrinologist, Dr. Milagros Reap was supposed to continue on levothyroxine and continue morning dose of hydrocortisone -plan to d/c with Cortef 30 mg daily -Restart levothyroxine after discussion with Dr. Lucianne Muss  Seizure disorder -Appreciate neurology--no additional AEDs for now -Continue Keppra -EEG -MRI brain per neurology request -no seizure in 2 years  History of ganglioglioma -follows neurology at [facility] -History of resection in 1993 with left temporal lobe resection 2004 -whole brain radiation 2006  Generalized weakness -Likely multifactorial including sepsis versus hypothyroidism and Addison's disease. As noted above, patient has not been on her medications last several weeks. -Continue supportive care at this time. -PT eval  Bacteruria -unclear clinical significance without any pyuria     Disposition Plan:   Home in 2-3 days  Family Communication:   Husband updated at bedside 11/23--Total time spent 35 minutes.  Greater than 50% spent face to face counseling and coordinating care.   Consultants:  neurology  Code Status:  FULL   DVT Prophylaxis:  -IV heparin     Subjective: Overall, patient is breathing better. She still has some dyspnea but denies any chest pain, headache, fevers, chills, nausea, vomiting, diarrhea, abdominal pain. No  dysuria or hematuria. No rashes.  Objective: Vitals:   11/29/15 0302 11/29/15 0500 11/29/15 0730 11/29/15 0800  BP: (!) 163/94   Marland Kitchen)  156/86  Pulse: (!) 104   (!) 102  Resp: (!) 26   (!) 24  Temp: 99.1 F (37.3 C)   98.9 F (37.2 C)  TempSrc: Oral   Oral  SpO2: 92%  96% 93%  Weight:  110.4 kg (243 lb 6.4 oz)    Height:        Intake/Output Summary (Last 24 hours) at 11/29/15 1316 Last data filed at 11/29/15 1003  Gross per 24 hour  Intake          1804.18 ml  Output              845 ml  Net           959.18 ml   Weight change: 4.763 kg (10 lb 8 oz) Exam:   General:  Pt is alert, follows commands appropriately, not in acute distress  HEENT: No icterus, No thrush, No neck mass, /AT  Cardiovascular: RRR, S1/S2, no rubs, no gallops  Respiratory: Basilar rales. No wheezing. Good air movement.  Abdomen: Soft/+BS, non tender, non distended, no guarding  Extremities:  1 + LE edema, No lymphangitis, No petechiae, No rashes, no synovitis   Data Reviewed: I have personally reviewed following labs and imaging studies Basic Metabolic Panel:  Recent Labs Lab 11/27/15 1445 11/28/15 0045 11/29/15 0332  NA 140 141 142  K 3.7 3.8 3.3*  CL 105 110 110  CO2 21* 24 23  GLUCOSE 146* 162* 107*  BUN 9 9 6   CREATININE 1.01* 0.73 0.81  CALCIUM 9.7 8.1* 8.4*   Liver Function Tests:  Recent Labs Lab 11/27/15 1445  AST 40  ALT 23  ALKPHOS 55  BILITOT 0.6  PROT 6.9  ALBUMIN 3.5   No results for input(s): LIPASE, AMYLASE in the last 168 hours. No results for input(s): AMMONIA in the last 168 hours. Coagulation Profile:  Recent Labs Lab 11/27/15 1445 11/27/15 2203  INR 1.18 1.24   CBC:  Recent Labs Lab 11/27/15 1445 11/27/15 2203 11/28/15 0045 11/29/15 0332  WBC 13.9* 9.5 9.7 6.6  NEUTROABS 9.7* 8.6*  --   --   HGB 12.1 9.8* 9.6* 9.1*  HCT 39.9 33.2* 32.1* 30.6*  MCV 80.8 81.0 80.9 80.5  PLT 484* 291 296 273   Cardiac Enzymes: No results for input(s):  CKTOTAL, CKMB, CKMBINDEX, TROPONINI in the last 168 hours. BNP: Invalid input(s): POCBNP CBG: No results for input(s): GLUCAP in the last 168 hours. HbA1C: No results for input(s): HGBA1C in the last 72 hours. Urine analysis:    Component Value Date/Time   COLORURINE AMBER (A) 11/27/2015 1608   APPEARANCEUR CLEAR 11/27/2015 1608   LABSPEC 1.029 11/27/2015 1608   PHURINE 6.5 11/27/2015 1608   GLUCOSEU NEGATIVE 11/27/2015 1608   HGBUR NEGATIVE 11/27/2015 1608   HGBUR negative 08/27/2009 1308   BILIRUBINUR SMALL (A) 11/27/2015 1608   BILIRUBINUR Neg 03/24/2013 0958   KETONESUR 15 (A) 11/27/2015 1608   PROTEINUR 30 (A) 11/27/2015 1608   UROBILINOGEN 1.0 11/05/2014 2240   NITRITE NEGATIVE 11/27/2015 1608   LEUKOCYTESUR SMALL (A) 11/27/2015 1608   Sepsis Labs: @LABRCNTIP (procalcitonin:4,lacticidven:4) ) Recent Results (from the past 240 hour(s))  Culture, blood (Routine x 2)     Status: None (Preliminary result)   Collection Time: 11/27/15  2:42 PM  Result Value Ref Range Status   Specimen Description BLOOD RIGHT WRIST  Final   Special Requests BOTTLES DRAWN AEROBIC AND ANAEROBIC 5CC  Final   Culture NO GROWTH < 24 HOURS  Final  Report Status PENDING  Incomplete  Culture, blood (Routine x 2)     Status: None (Preliminary result)   Collection Time: 11/27/15  3:43 PM  Result Value Ref Range Status   Specimen Description BLOOD RIGHT WRIST  Final   Special Requests BOTTLES DRAWN AEROBIC AND ANAEROBIC 5CC  Final   Culture NO GROWTH < 24 HOURS  Final   Report Status PENDING  Incomplete  Urine culture     Status: Abnormal (Preliminary result)   Collection Time: 11/27/15  4:05 PM  Result Value Ref Range Status   Specimen Description URINE, CATHETERIZED  Final   Special Requests NONE  Final   Culture (A)  Final    >=100,000 COLONIES/mL ENTEROCOCCUS FAECALIS SUSCEPTIBILITIES TO FOLLOW    Report Status PENDING  Incomplete  MRSA PCR Screening     Status: None   Collection Time:  11/27/15  9:39 PM  Result Value Ref Range Status   MRSA by PCR NEGATIVE NEGATIVE Final    Comment:        The GeneXpert MRSA Assay (FDA approved for NASAL specimens only), is one component of a comprehensive MRSA colonization surveillance program. It is not intended to diagnose MRSA infection nor to guide or monitor treatment for MRSA infections.      Scheduled Meds: . aspirin EC  81 mg Oral Daily  . ceFEPime (MAXIPIME) IV  2 g Intravenous Q12H  . DULoxetine  60 mg Oral Daily  . fenofibrate  160 mg Oral Daily  . fesoterodine  8 mg Oral Daily  . ipratropium-albuterol  3 mL Nebulization TID  . levETIRAcetam  1,500 mg Oral BID  . pantoprazole  40 mg Oral Daily  . pregabalin  150 mg Oral BID  . rosuvastatin  10 mg Oral Q M,W,F  . vancomycin  1,250 mg Intravenous Q24H   Continuous Infusions: . sodium chloride 75 mL/hr at 11/29/15 1002  . heparin 1,500 Units/hr (11/29/15 1002)    Procedures/Studies: Ct Angio Chest Pe W Or Wo Contrast  Result Date: 11/28/2015 CLINICAL DATA:  Shortness of breath for 3 weeks worse in the last few days EXAM: CT ANGIOGRAPHY CHEST WITH CONTRAST TECHNIQUE: Multidetector CT imaging of the chest was performed using the standard protocol during bolus administration of intravenous contrast. Multiplanar CT image reconstructions and MIPs were obtained to evaluate the vascular anatomy. CONTRAST:  80 mL Isovue 370 intravenous COMPARISON:  Chest x-ray 11/28/2015 FINDINGS: Cardiovascular: Suboptimal contrast bolus for evaluation of pulmonary embolus. No gross filling defects within the main pulmonary arteries. There are no obvious filling defects within the central segmental pulmonary arteries on the right. Questionable small filling defects within left upper lobe segmental pulmonary artery, series 4, image number 31. Assessment of RV LV ratio limited due to imaging planes. This is estimated to be less than 0.9. Aorta demonstrates no evidence for dissection. Minimal  calcification is present. Heart is upper normal in size. No pericardial effusion. Mediastinum/Nodes: Thyroid gland within normal limits. Prominent mediastinal fat. Sub cm lymph nodes within the mediastinum. No axillary adenopathy. Mild diffuse air distention of the esophagus. Lungs/Pleura: Bilateral ground-glass densities within the lung fields. Bandlike atelectasis in the right upper lobe. No significant pleural effusion. Upper Abdomen: No acute findings in the upper abdomen. Probable pill fragment in the distal stomach. Musculoskeletal: No acute osseous abnormality. Review of the MIP images confirms the above findings. IMPRESSION: 1. Limited examination secondary to suboptimal contrast bolus and poor opacification of the pulmonary arteries. Possible small filling defect within segmental left  upper lobe pulmonary artery, small thrombus cannot be excluded. No definite evidence for right heart strain. 2. Bilateral scattered areas of ground-glass density, could relate to edema, infection, or small airways disease. Bandlike atelectasis in the right upper lobe. No pleural effusion. These results will be called to the ordering clinician or representative by the Radiologist Assistant, and communication documented in the PACS or zVision Dashboard. Electronically Signed   By: Jasmine Pang M.D.   On: 11/28/2015 15:31   Dg Chest Port 1 View  Result Date: 11/28/2015 CLINICAL DATA:  Sepsis, followup EXAM: PORTABLE CHEST 1 VIEW COMPARISON:  Portable chest x-ray of 11/27/2015 FINDINGS: Again the lungs are poorly aerated although aeration has improved slightly. Mild left basilar linear atelectasis is present. No effusion is noted. Mediastinal and hilar contours are unchanged and the heart is within normal limits in size. No bony abnormality is seen. IMPRESSION: Slightly improved aeration.  Mild left basilar linear atelectasis. Electronically Signed   By: Dwyane Dee M.D.   On: 11/28/2015 08:07   Dg Chest Port 1  View  Result Date: 11/27/2015 CLINICAL DATA:  Fever EXAM: PORTABLE CHEST 1 VIEW COMPARISON:  November 05, 2014 FINDINGS: The degree of inspiration is shallow. There is atelectatic change in the right upper and lower lobe regions. Lungs elsewhere clear. Heart is upper normal in size with pulmonary vascularity within normal limits. No adenopathy. No bone lesions. IMPRESSION: Shallow degree of inspiration. Patchy atelectasis on the right. No edema or consolidation. Stable cardiac silhouette. Electronically Signed   By: Bretta Bang III M.D.   On: 11/27/2015 16:18    Ermon Sagan, DO  [facility] Pager [phone]  If 7PM-7AM, please contact night-coverage [email or web address] Password [email or web address] 11/29/2015, 1:16 PM   LOS: 2 days

## 2015-11-29 NOTE — Progress Notes (Signed)
ANTICOAGULATION CONSULT NOTE - Follow Up Consult  Pharmacy Consult for Heparin Indication: pulmonary embolus  Allergies  Allergen Reactions  . Versed [Midazolam] Other (See Comments) and Anaphylaxis    Severe respiratory depression, had to be masked in preop holding.  . Ciprofloxacin Nausea And Vomiting  . Cyclobenzaprine Itching    Other reaction(s): Itching / Pruritis (ALLERGY/intolerance)  . Methocarbamol Itching    REACTION: itching   . Metronidazole Other (See Comments)    Unknown   . Penicillins Rash    Patient Measurements: Height: 5\' 2"  (157.5 cm) Weight: 243 lb 6.4 oz (110.4 kg) IBW/kg (Calculated) : 50.1 Heparin Dosing Weight: 76.6 kg  Vital Signs: Temp: 98.2 F (36.8 C) (11/23 1600) Temp Source: Oral (11/23 1600) BP: 134/77 (11/23 1600) Pulse Rate: 107 (11/23 1600)  Labs:  Recent Labs  11/27/15 1445 11/27/15 2203 11/28/15 0045 11/29/15 0023 11/29/15 0332 11/29/15 0819 11/29/15 1623  HGB 12.1 9.8* 9.6*  --  9.1*  --   --   HCT 39.9 33.2* 32.1*  --  30.6*  --   --   PLT 484* 291 296  --  273  --   --   APTT  --  37*  --   --   --   --   --   LABPROT 15.0 15.7*  --   --   --   --   --   INR 1.18 1.24  --   --   --   --   --   HEPARINUNFRC  --   --   --  0.25*  --  0.72* 0.55  CREATININE 1.01*  --  0.73  --  0.81  --   --     Estimated Creatinine Clearance: 88.7 mL/min (by C-G formula based on SCr of 0.81 mg/dL).  Medications: Heparin @ 1500 units/hr  Assessment: [name] continues on heparin for possible PE. Heparin level is now therapeutic at 0.55 after rate decrease earlier today. No bleeding reported.  Goal of Therapy:  Heparin level 0.3-0.7 units/ml Monitor platelets by anticoagulation protocol: Yes   Plan:  1) Continue heparin at 1500 units/hr 2) Daily heparin level and CBC   Nena Jordan, PharmD, BCPS  11/29/2015 5:41 PM

## 2015-11-29 NOTE — Progress Notes (Signed)
Influenza panel reentered due to    Lab staff unable to view order.  Influenza panel has been collected.

## 2015-11-30 ENCOUNTER — Inpatient Hospital Stay (HOSPITAL_COMMUNITY): Payer: 59

## 2015-11-30 DIAGNOSIS — M79609 Pain in unspecified limb: Secondary | ICD-10-CM

## 2015-11-30 LAB — URINE CULTURE

## 2015-11-30 LAB — BASIC METABOLIC PANEL
Anion gap: 8 (ref 5–15)
BUN: <5 mg/dL — ABNORMAL LOW (ref 6–20)
CALCIUM: 8.4 mg/dL — AB (ref 8.9–10.3)
CO2: 27 mmol/L (ref 22–32)
CREATININE: 0.71 mg/dL (ref 0.44–1.00)
Chloride: 108 mmol/L (ref 101–111)
GFR calc non Af Amer: >60 mL/min (ref >60)
GLUCOSE: 93 mg/dL (ref 65–99)
Potassium: 3.5 mmol/L (ref 3.5–5.1)
Sodium: 143 mmol/L (ref 135–145)

## 2015-11-30 LAB — CBC
HCT: 30 % — ABNORMAL LOW (ref 36.0–46.0)
Hemoglobin: 8.9 g/dL — ABNORMAL LOW (ref 12.0–15.0)
MCH: 24.1 pg — AB (ref 26.0–34.0)
MCHC: 29.7 g/dL — ABNORMAL LOW (ref 30.0–36.0)
MCV: 81.1 fL (ref 78.0–100.0)
PLATELETS: 272 10*3/uL (ref 150–400)
RBC: 3.7 MIL/uL — ABNORMAL LOW (ref 3.87–5.11)
RDW: 17.5 % — AB (ref 11.5–15.5)
WBC: 5.4 10*3/uL (ref 4.0–10.5)

## 2015-11-30 LAB — HEPARIN LEVEL (UNFRACTIONATED): HEPARIN UNFRACTIONATED: 0.56 [IU]/mL (ref 0.30–0.70)

## 2015-11-30 LAB — MAGNESIUM: Magnesium: 1.5 mg/dL — ABNORMAL LOW (ref 1.7–2.4)

## 2015-11-30 LAB — LEVETIRACETAM LEVEL: Levetiracetam Lvl: 64.3 ug/mL — ABNORMAL HIGH (ref 10.0–40.0)

## 2015-11-30 LAB — PROCALCITONIN: PROCALCITONIN: 0.36 ng/mL

## 2015-11-30 MED ORDER — IPRATROPIUM-ALBUTEROL 0.5-2.5 (3) MG/3ML IN SOLN
3.0000 mL | Freq: Four times a day (QID) | RESPIRATORY_TRACT | Status: DC
  Administered 2015-11-30 – 2015-12-01 (×3): 3 mL via RESPIRATORY_TRACT
  Filled 2015-11-30 (×4): qty 3

## 2015-11-30 MED ORDER — HYDROXYZINE HCL 25 MG PO TABS
25.0000 mg | ORAL_TABLET | Freq: Three times a day (TID) | ORAL | Status: DC | PRN
  Administered 2015-11-30: 25 mg via ORAL
  Filled 2015-11-30: qty 1

## 2015-11-30 MED ORDER — AZITHROMYCIN 500 MG PO TABS
500.0000 mg | ORAL_TABLET | Freq: Every day | ORAL | Status: AC
  Administered 2015-11-30 – 2015-12-02 (×3): 500 mg via ORAL
  Filled 2015-11-30 (×3): qty 1

## 2015-11-30 MED ORDER — APIXABAN 5 MG PO TABS: 5.0000 mg | ORAL_TABLET | Freq: Two times a day (BID) | ORAL | Status: DC

## 2015-11-30 MED ORDER — RACEPINEPHRINE HCL 2.25 % IN NEBU
0.5000 mL | INHALATION_SOLUTION | Freq: Once | RESPIRATORY_TRACT | Status: AC
  Administered 2015-11-30: 0.5 mL via RESPIRATORY_TRACT
  Filled 2015-11-30: qty 0.5

## 2015-11-30 MED ORDER — MAGNESIUM SULFATE 2 GM/50ML IV SOLN
2.0000 g | Freq: Once | INTRAVENOUS | Status: AC
  Administered 2015-11-30: 2 g via INTRAVENOUS
  Filled 2015-11-30: qty 50

## 2015-11-30 MED ORDER — HYDROCORTISONE 20 MG PO TABS
30.0000 mg | ORAL_TABLET | Freq: Every day | ORAL | Status: DC
  Administered 2015-12-01 – 2015-12-03 (×3): 30 mg via ORAL
  Filled 2015-11-30 (×3): qty 1

## 2015-11-30 MED ORDER — METHYLPREDNISOLONE SODIUM SUCC 40 MG IJ SOLR
40.0000 mg | Freq: Once | INTRAMUSCULAR | Status: AC
  Administered 2015-11-30: 40 mg via INTRAVENOUS
  Filled 2015-11-30: qty 1

## 2015-11-30 MED ORDER — APIXABAN 5 MG PO TABS
10.0000 mg | ORAL_TABLET | Freq: Two times a day (BID) | ORAL | Status: DC
  Administered 2015-11-30 – 2015-12-03 (×6): 10 mg via ORAL
  Filled 2015-11-30: qty 2
  Filled 2015-11-30: qty 4
  Filled 2015-11-30 (×4): qty 2

## 2015-11-30 NOTE — Progress Notes (Signed)
Called to patient's room for PRN, patient was audibly wheezing and after patient's treatment was given she was complaining of pain in her throat and stridor was heard over the trachea and audibly. RN notified and pharmacy called to send up Racemic Epi.

## 2015-11-30 NOTE — Progress Notes (Signed)
*  PRELIMINARY RESULTS* Vascular Ultrasound Bilateral lower extremity venous duplex has been completed.  Preliminary findings: No evidence of deep vein thrombosis in the visualized veins of the lower extremities. Negative for baker's cysts bilaterally.    Everrett Coombe 11/30/2015, 4:32 PM

## 2015-11-30 NOTE — Progress Notes (Signed)
PROGRESS NOTE  [name] ZOX:[id number] DOB: 10/16/1955 DOA: 11/27/2015 PCP: Neena Rhymes, MD  Brief History: 60 y.o.femalewith a medical history of Addison's disease, brain cancer, seizure disorder, hypothyroidism, presented to the emergency department with complaints of generalized weakness, fatigue, and diarrhea. Patient stated that she started having diarrhea early am 11/20/17and feeling upset her stomach. Per husband, at approximately 1 AM this morning, patient was having a bowel movement, when he noticed that she started to stare off into space and was unresponsive to him. Episode lasted approximately 60 seconds. Husband states the patient has had seizures related to her brain cancer however since removal the seizures have improved. Patient iss on generic Keppra.Notably, the patient has been off of her hydrocortisone and levothyroxine for the past 3 weeks due to some communication mixup with her endocrinologist. In the emergency department, the patient was noted to have a temperature up to 101.33F with tachycardia and soft blood pressures of 95/56. The patient was started on intravenous vancomycin and cefepime and IV fluids.  Assessment/Plan: Sepsis -suspect adrenal insufficiency may be contributing to much of this picture as she has been off her usual hydrocortisone -underlying pneumonia also contributing -Lactic acid Peaked at 5.7 -Continue IV fluids -Initially received vancomycin and Zosyn in the emergency department -Continue vancomycin and cefepime pending culture data -Urinalysis negative for pyuria -Chest x-ray negative for consolidation -CTA chest with bilateral ground glass opacities  Diarrhea -Suspect that this may be related to the patient's recent Maalox use -She normally suffers with constipation -Has not had a bowel movement since admission  Acute respiratory failure with hypoxia -Secondary to pulmonary embolus -11/28/2015 CTA chest--possible  left upper lobe pulmonary artery filling defects,with scattered groundglass opacities -Procalcitonin 0.49-->0.66 -now stable on RA  Acute pulmonary embolus -11/28/2015 CTA chest--possible left upper lobe pulmonary artery filling defects,with scattered groundglass opacities -started IV heparin 11/22 -11/30/15 transition to apixaban this evening -given immobility, tachycardia, hypoxia--plan to treat as PE -lower extremity venous duplex--neg for DVT -11/29/15 echo--EF 55-60%, no WMA, grade 1 DD, trivial TR  Pulmonary Opacities--lobar pneumonia -pt presented with fever and dyspnea -Procalcitonin 0.49-->0.66 -plan 5 days of abx -d/c vanco and cefepime (11/21>>>11/24) -azithromycin x 3 days  Addison's disease -Case was discussed with the patient's endocrinologist, Dr. Milagros Reap was supposed to continue on levothyroxine and continue morning dose of hydrocortisone -plan to d/c with Cortef 30 mg daily -Restart levothyroxine after discussion with Dr. Lucianne Muss  Seizure disorder -Appreciate neurology--no additional AEDs for now -Continue Keppra -EEG--no epileptiform potential -MRI brain--no acute infarct, stable left frontal resection cavity, no new mass. -no seizure in 2 years  History of ganglioglioma -follows neurology at [facility] -History of resection in 1993 with left temporal lobe resection 2004 -whole brain radiation 2006  Generalized weakness -Likely multifactorial including sepsis versus hypothyroidism and Addison's disease. As noted above, patient has not been on her medications last several weeks. -Continue supportive care at this time. -PT eval  Bacteruria -unclear clinical significance without any pyuria  Anxiety -alprazolam 0.25 mg bid prn anxiety     Disposition Plan: Home in 2-3 days  Family Communication: Husband updated at bedside 11/24--Total time spent 35 minutes. Greater than 50% spent face to face counseling and coordinating  care.   Consultants: neurology  Code Status: FULL   DVT Prophylaxis: -IV heparin   Subjective: Patient feels anxious today and husband reports that she is a little "on edge". The patient feels that she is breathing better.  She denies any chest pain, nausea, vomiting, diarrhea, abdominal pain. No dysuria or hematuria. Denies any headache, fevers.  Objective: Vitals:   11/30/15 1145 11/30/15 1151 11/30/15 1700 11/30/15 1720  BP:   (!) 152/84   Pulse:   (!) 102   Resp:      Temp:  98.2 F (36.8 C)  99.2 F (37.3 C)  TempSrc:  Axillary  Oral  SpO2: 95%  93%   Weight:   112.9 kg (248 lb 12.8 oz)   Height:   5\' 2"  (1.575 m)     Intake/Output Summary (Last 24 hours) at 11/30/15 1806 Last data filed at 11/30/15 1743  Gross per 24 hour  Intake            724.5 ml  Output             1000 ml  Net           -275.5 ml   Weight change: 3.901 kg (8 lb 9.6 oz) Exam:   General:  Pt is alert, follows commands appropriately, not in acute distress  HEENT: No icterus, No thrush, No neck mass, [place]/AT  Cardiovascular: RRR, S1/S2, no rubs, no gallops  Respiratory: Bibasilar crackles, left greater than right. No wheezing. Good air movement.  Abdomen: Soft/+BS, non tender, non distended, no guarding  Extremities: 1 + LE edema, No lymphangitis, No petechiae, No rashes, no synovitis   Data Reviewed: I have personally reviewed following labs and imaging studies Basic Metabolic Panel:  Recent Labs Lab 11/27/15 1445 11/28/15 0045 11/29/15 0332 11/30/15 0509  NA 140 141 142 143  K 3.7 3.8 3.3* 3.5  CL 105 110 110 108  CO2 21* 24 23 27   GLUCOSE 146* 162* 107* 93  BUN 9 9 6  <5*  CREATININE 1.01* 0.73 0.81 0.71  CALCIUM 9.7 8.1* 8.4* 8.4*  MG  --   --   --  1.5*   Liver Function Tests:  Recent Labs Lab 11/27/15 1445  AST 40  ALT 23  ALKPHOS 55  BILITOT 0.6  PROT 6.9  ALBUMIN 3.5   No results for input(s): LIPASE, AMYLASE in the last 168 hours. No results for  input(s): AMMONIA in the last 168 hours. Coagulation Profile:  Recent Labs Lab 11/27/15 1445 11/27/15 2203  INR 1.18 1.24   CBC:  Recent Labs Lab 11/27/15 1445 11/27/15 2203 11/28/15 0045 11/29/15 0332 11/30/15 0509  WBC 13.9* 9.5 9.7 6.6 5.4  NEUTROABS 9.7* 8.6*  --   --   --   HGB 12.1 9.8* 9.6* 9.1* 8.9*  HCT 39.9 33.2* 32.1* 30.6* 30.0*  MCV 80.8 81.0 80.9 80.5 81.1  PLT 484* 291 296 273 272   Cardiac Enzymes: No results for input(s): CKTOTAL, CKMB, CKMBINDEX, TROPONINI in the last 168 hours. BNP: Invalid input(s): POCBNP CBG:  Recent Labs Lab 11/29/15 1623  GLUCAP 99   HbA1C: No results for input(s): HGBA1C in the last 72 hours. Urine analysis:    Component Value Date/Time   COLORURINE AMBER (A) 11/27/2015 1608   APPEARANCEUR CLEAR 11/27/2015 1608   LABSPEC 1.029 11/27/2015 1608   PHURINE 6.5 11/27/2015 1608   GLUCOSEU NEGATIVE 11/27/2015 1608   HGBUR NEGATIVE 11/27/2015 1608   HGBUR negative 08/27/2009 1308   BILIRUBINUR SMALL (A) 11/27/2015 1608   BILIRUBINUR Neg 03/24/2013 0958   KETONESUR 15 (A) 11/27/2015 1608   PROTEINUR 30 (A) 11/27/2015 1608   UROBILINOGEN 1.0 11/05/2014 2240   NITRITE NEGATIVE 11/27/2015 1608   LEUKOCYTESUR SMALL (A)  11/27/2015 1608   Sepsis Labs: @LABRCNTIP (procalcitonin:4,lacticidven:4) ) Recent Results (from the past 240 hour(s))  Culture, blood (Routine x 2)     Status: None (Preliminary result)   Collection Time: 11/27/15  2:42 PM  Result Value Ref Range Status   Specimen Description BLOOD RIGHT WRIST  Final   Special Requests BOTTLES DRAWN AEROBIC AND ANAEROBIC 5CC  Final   Culture NO GROWTH 3 DAYS  Final   Report Status PENDING  Incomplete  Culture, blood (Routine x 2)     Status: None (Preliminary result)   Collection Time: 11/27/15  3:43 PM  Result Value Ref Range Status   Specimen Description BLOOD RIGHT WRIST  Final   Special Requests BOTTLES DRAWN AEROBIC AND ANAEROBIC 5CC  Final   Culture NO GROWTH 3  DAYS  Final   Report Status PENDING  Incomplete  Urine culture     Status: Abnormal   Collection Time: 11/27/15  4:05 PM  Result Value Ref Range Status   Specimen Description URINE, CATHETERIZED  Final   Special Requests NONE  Final   Culture >=100,000 COLONIES/mL ENTEROCOCCUS FAECALIS (A)  Final   Report Status 11/30/2015 FINAL  Final   Organism ID, Bacteria ENTEROCOCCUS FAECALIS (A)  Final      Susceptibility   Enterococcus faecalis - MIC*    AMPICILLIN <=2 SENSITIVE Sensitive     LEVOFLOXACIN >=8 RESISTANT Resistant     NITROFURANTOIN <=16 SENSITIVE Sensitive     VANCOMYCIN 1 SENSITIVE Sensitive     * >=100,000 COLONIES/mL ENTEROCOCCUS FAECALIS  MRSA PCR Screening     Status: None   Collection Time: 11/27/15  9:39 PM  Result Value Ref Range Status   MRSA by PCR NEGATIVE NEGATIVE Final    Comment:        The GeneXpert MRSA Assay (FDA approved for NASAL specimens only), is one component of a comprehensive MRSA colonization surveillance program. It is not intended to diagnose MRSA infection nor to guide or monitor treatment for MRSA infections.      Scheduled Meds: . apixaban  10 mg Oral BID   Followed by  . [START ON 12/07/2015] apixaban  5 mg Oral BID  . aspirin EC  81 mg Oral Daily  . azithromycin  500 mg Oral Daily  . DULoxetine  60 mg Oral Daily  . fenofibrate  160 mg Oral Daily  . fesoterodine  8 mg Oral Daily  . [START ON 12/01/2015] hydrocortisone  30 mg Oral Daily  . ipratropium-albuterol  3 mL Nebulization QID  . levETIRAcetam  1,500 mg Oral BID  . pantoprazole  40 mg Oral Daily  . pregabalin  150 mg Oral BID  . rosuvastatin  10 mg Oral Q M,W,F   Continuous Infusions:  Procedures/Studies: Ct Angio Chest Pe W Or Wo Contrast  Result Date: 11/28/2015 CLINICAL DATA:  Shortness of breath for 3 weeks worse in the last few days EXAM: CT ANGIOGRAPHY CHEST WITH CONTRAST TECHNIQUE: Multidetector CT imaging of the chest was performed using the standard protocol  during bolus administration of intravenous contrast. Multiplanar CT image reconstructions and MIPs were obtained to evaluate the vascular anatomy. CONTRAST:  80 mL Isovue 370 intravenous COMPARISON:  Chest x-ray 11/28/2015 FINDINGS: Cardiovascular: Suboptimal contrast bolus for evaluation of pulmonary embolus. No gross filling defects within the main pulmonary arteries. There are no obvious filling defects within the central segmental pulmonary arteries on the right. Questionable small filling defects within left upper lobe segmental pulmonary artery, series 4, image number 31.  Assessment of RV LV ratio limited due to imaging planes. This is estimated to be less than 0.9. Aorta demonstrates no evidence for dissection. Minimal calcification is present. Heart is upper normal in size. No pericardial effusion. Mediastinum/Nodes: Thyroid gland within normal limits. Prominent mediastinal fat. Sub cm lymph nodes within the mediastinum. No axillary adenopathy. Mild diffuse air distention of the esophagus. Lungs/Pleura: Bilateral ground-glass densities within the lung fields. Bandlike atelectasis in the right upper lobe. No significant pleural effusion. Upper Abdomen: No acute findings in the upper abdomen. Probable pill fragment in the distal stomach. Musculoskeletal: No acute osseous abnormality. Review of the MIP images confirms the above findings. IMPRESSION: 1. Limited examination secondary to suboptimal contrast bolus and poor opacification of the pulmonary arteries. Possible small filling defect within segmental left upper lobe pulmonary artery, small thrombus cannot be excluded. No definite evidence for right heart strain. 2. Bilateral scattered areas of ground-glass density, could relate to edema, infection, or small airways disease. Bandlike atelectasis in the right upper lobe. No pleural effusion. These results will be called to the ordering clinician or representative by the Radiologist Assistant, and  communication documented in the PACS or zVision Dashboard. Electronically Signed   By: Jasmine Pang M.D.   On: 11/28/2015 15:31   Mr Brain Wo Contrast  Result Date: 11/29/2015 CLINICAL DATA:  60 y/o F; history of Addison's disease, brain cancer post resection and radiation, seizure disorder, hypothyroidism, presenting to the emergency department with generalized weakness. EXAM: MRI HEAD WITHOUT CONTRAST TECHNIQUE: Multiplanar, multiecho pulse sequences of the brain and surrounding structures were obtained without intravenous contrast. COMPARISON:  11/06/2015 MRI of the head. FINDINGS: Brain: No diffusion signal abnormality. Partially empty sella turcica. Large left frontal lobe resection cavity is stable in size. Stable ex vacuo dilatation of frontal horn of left lateral ventricle. Stable confluent T2 FLAIR hyperintense signal abnormality throughout white matter in the left-greater-than-right hemispheres of the brain. Stable mild parenchymal volume loss of the brain. No new focal mass effect. No new susceptibility hypointensity to indicate interval intracranial hemorrhage. No extra-axial collection. No effacement of basilar cisterns. Vascular: Normal flow voids. Skull and upper cervical spine: Stable postsurgical changes related to left frontal craniotomy. Sinuses/Orbits: Negative. Other: None. IMPRESSION: 1. No evidence for acute infarct, new mass effect, or intracranial hemorrhage. 2. Left frontal resection cavity is stable. Confluent T2 FLAIR hyperintense white matter changes of the left-greater-than-right hemispheres is also stable in distribution and consistent with posttreatment change. 3. Stable brain parenchymal volume loss. Electronically Signed   By: Mitzi Hansen M.D.   On: 11/29/2015 23:54   Dg Chest Port 1 View  Result Date: 11/30/2015 CLINICAL DATA:  Pneumonia. EXAM: PORTABLE CHEST 1 VIEW COMPARISON:  Chest CTA and radiographs 11/28/2015 FINDINGS: The cardiomediastinal silhouette  is unchanged. The lungs remain hypoinflated. Minimal left basilar opacity likely reflects atelectasis. Small focus of curvilinear opacity in the right lung also likely represents atelectasis. No overt pulmonary edema, sizable pleural effusion, or pneumothorax is identified. IMPRESSION: Low lung volumes with minimal atelectasis. Electronically Signed   By: Sebastian Ache M.D.   On: 11/30/2015 07:09   Dg Chest Port 1 View  Result Date: 11/28/2015 CLINICAL DATA:  Sepsis, followup EXAM: PORTABLE CHEST 1 VIEW COMPARISON:  Portable chest x-ray of 11/27/2015 FINDINGS: Again the lungs are poorly aerated although aeration has improved slightly. Mild left basilar linear atelectasis is present. No effusion is noted. Mediastinal and hilar contours are unchanged and the heart is within normal limits in size. No bony abnormality  is seen. IMPRESSION: Slightly improved aeration.  Mild left basilar linear atelectasis. Electronically Signed   By: Dwyane Dee M.D.   On: 11/28/2015 08:07   Dg Chest Port 1 View  Result Date: 11/27/2015 CLINICAL DATA:  Fever EXAM: PORTABLE CHEST 1 VIEW COMPARISON:  November 05, 2014 FINDINGS: The degree of inspiration is shallow. There is atelectatic change in the right upper and lower lobe regions. Lungs elsewhere clear. Heart is upper normal in size with pulmonary vascularity within normal limits. No adenopathy. No bone lesions. IMPRESSION: Shallow degree of inspiration. Patchy atelectasis on the right. No edema or consolidation. Stable cardiac silhouette. Electronically Signed   By: Bretta Bang III M.D.   On: 11/27/2015 16:18    Mersadez Linden, DO  [facility] Pager [phone]  If 7PM-7AM, please contact night-coverage [email or web address] Password [email or web address] 11/30/2015, 6:06 PM   LOS: 3 days

## 2015-11-30 NOTE — Progress Notes (Addendum)
ANTICOAGULATION CONSULT NOTE - Follow Up Consult  Pharmacy Consult for heparin and vancomycin/cefepime Indication: pulmonary embolus and sepsis  Allergies  Allergen Reactions  . Versed [Midazolam] Other (See Comments) and Anaphylaxis    Severe respiratory depression, had to be masked in preop holding.  . Ciprofloxacin Nausea And Vomiting  . Cyclobenzaprine Itching    Other reaction(s): Itching / Pruritis (ALLERGY/intolerance)  . Methocarbamol Itching    REACTION: itching   . Metronidazole Other (See Comments)    Unknown   . Penicillins Rash    Patient Measurements: Height: 5\' 2"  (157.5 cm) Weight: 249 lb 1.6 oz (113 kg) IBW/kg (Calculated) : 50.1 Heparin Dosing Weight: 77.7 kg  Vital Signs: Temp: 98.9 F (37.2 C) (11/24 0422) Temp Source: Oral (11/24 0422) BP: 155/94 (11/24 0422) Pulse Rate: 97 (11/24 0422)  Labs:  Recent Labs  11/27/15 1445 11/27/15 2203 11/28/15 0045  11/29/15 0332 11/29/15 0819 11/29/15 1623 11/30/15 0509 11/30/15 0510  HGB 12.1 9.8* 9.6*  --  9.1*  --   --  8.9*  --   HCT 39.9 33.2* 32.1*  --  30.6*  --   --  30.0*  --   PLT 484* 291 296  --  273  --   --  272  --   APTT  --  37*  --   --   --   --   --   --   --   LABPROT 15.0 15.7*  --   --   --   --   --   --   --   INR 1.18 1.24  --   --   --   --   --   --   --   HEPARINUNFRC  --   --   --   < >  --  0.72* 0.55  --  0.56  CREATININE 1.01*  --  0.73  --  0.81  --   --  0.71  --   < > = values in this interval not displayed.  Estimated Creatinine Clearance: 91.1 mL/min (by C-G formula based on SCr of 0.71 mg/dL).   Medications:  Prescriptions Prior to Admission  Medication Sig Dispense Refill Last Dose  . aspirin EC 81 MG EC tablet Take 1 tablet (81 mg total) by mouth daily. 30 tablet 3 11/27/2015 at Unknown time  . aspirin-acetaminophen-caffeine (EXCEDRIN MIGRAINE) 250-250-65 MG per tablet Take by mouth every 6 (six) hours as needed for headache.   11/26/2015 at Unknown time  .  Calcium Citrate-Vitamin D (CITRACAL + D PO) Take 1 tablet by mouth daily.   11/27/2015 at Unknown time  . DULoxetine (CYMBALTA) 60 MG capsule Take 1 capsule (60 mg total) by mouth daily. 30 capsule 6 11/27/2015 at Unknown time  . fenofibrate 160 MG tablet Take 1 tablet (160 mg total) by mouth daily. 30 tablet 6 11/27/2015 at Unknown time  . KEPPRA 500 MG tablet Take 3 tablets (1,500 mg total) by mouth 2 (two) times daily. 270 tablet 11 11/27/2015 at Unknown time  . methocarbamol (ROBAXIN) 500 MG tablet Take 1 tablet (500 mg total) by mouth 3 (three) times daily as needed for muscle spasms. 60 tablet 0 Past Month at Unknown time  . omeprazole (PRILOSEC) 20 MG capsule TAKE 1 CAPSULE BY MOUTH DAILY 30 capsule 6 11/27/2015 at Unknown time  . pregabalin (LYRICA) 150 MG capsule Take 1 capsule (150 mg total) by mouth 2 (two) times daily. 60 capsule 2 11/27/2015 at Unknown  time  . promethazine (PHENERGAN) 25 MG tablet Take 1 tablet (25 mg total) by mouth every 6 (six) hours as needed for nausea or vomiting. 30 tablet 6 11/26/2015 at Unknown time  . rosuvastatin (CRESTOR) 10 MG tablet TAKE 1 TABLET BY MOUTH  EVERY MONDAY, WEDNESDAY,  FRIDAY 39 tablet 6 11/26/2015 at Unknown time  . traZODone (DESYREL) 50 MG tablet Take 0.5-1 tablets (25-50 mg total) by mouth at bedtime as needed for sleep. (Patient taking differently: Take 50 mg by mouth at bedtime. ) 30 tablet 3 11/26/2015 at Unknown time  . hydrocortisone (CORTEF) 20 MG tablet TAKE 1 TABLET BY MOUTH EVERY MORNING AND1/2 TABLET IN THE EVENING 45 tablet 2 on hold  . levothyroxine (SYNTHROID, LEVOTHROID) 100 MCG tablet Take 1 tablet (100 mcg total) by mouth daily. (Patient taking differently: Take 50 mcg by mouth daily. ) 30 tablet 3 on hold    Assessment: 60 year old female admitted 11/27/2015 with new acute PE. D-dimer elevated 1.43. Pharmacy consulted to dose heparin. Patient also on vancomycin and cefepime for sepsis. She is afebrile, WBC wnl and she is on  2L . Today is day 3 of antibiotics.  HL 0.56 (therapeutic), Hgb 8.9, Plt wnl. No s/sx of bleeding noted.  Goal of Therapy:  Heparin level 0.3-0.7 units/ml Monitor platelets by anticoagulation protocol: Yes   Plan:  - Continue heparin 1500 units/hr - Monitor daily heparin level, CBC and signs/symptoms of bleeeding - Follow up starting apixaban when appropriate - Continue vancomycin 1250g IV q24h - Continue cefepime 2g IV q12h - Monitor C&S, vancomycin trough at steady state and clinical progression  Dimitri Ped, PharmD. PGY-2 Infectious Diseases Pharmacy Resident Pager: [phone] 11/30/2015,7:43 [place] consulted to start apixaban for pulmonary embolism in place of heparin. Patient is currently therapeutic on IV heparin. Patient is also on aspirin 81 mg daily.  Plan:  - Discontinue IV heparin at 1800 - Give first dose of Eliquis at same time heparin is discontinued at 1800 - Eliquis 10 mg BID x 7 days, followed by 5 mg BID - Monitor CBC and signs/symptoms of bleeding

## 2015-11-30 NOTE — Progress Notes (Signed)
Report called to RN on [place]. Patient to be transferred to [place] [facility].

## 2015-11-30 NOTE — Procedures (Signed)
EEG REPORT  CLINICAL HISTORY This is a 60yo F with h/o seizures who may have had a breakthrough seizure vs. Another event  TECHNIQUE This is a routine bedside EEG done with standard technique.  The patient was drowsy at times  INTERPRETATION Background activity was 6-7hz  theta rhythm.  It was disorganized. There appeared to be a breach rhythm on the left consistent with prior resection.  There was also periodic slowing noted on the left hemisphere.  Note was made of artifact from noise and muscle activity at times.  There was no evidence for any epileptiform activity.  IMPRESSION This is an abnormal EEG due to mild-moderate generalized slowing.  CLINICAL CORRELATION The findings are consistent with a mild encephalopathy but the etiology cannot be determined from EEG.  There is nothing to suggest any seizure activity at this time.  Valene Bors, MD Neurology

## 2015-12-01 LAB — BASIC METABOLIC PANEL
ANION GAP: 8 (ref 5–15)
BUN: 6 mg/dL (ref 6–20)
CALCIUM: 8.8 mg/dL — AB (ref 8.9–10.3)
CO2: 28 mmol/L (ref 22–32)
Chloride: 103 mmol/L (ref 101–111)
Creatinine, Ser: 0.8 mg/dL (ref 0.44–1.00)
GFR calc Af Amer: >60 mL/min (ref >60)
GFR calc non Af Amer: >60 mL/min (ref >60)
Glucose, Bld: 122 mg/dL — ABNORMAL HIGH (ref 65–99)
POTASSIUM: 4.3 mmol/L (ref 3.5–5.1)
SODIUM: 139 mmol/L (ref 135–145)

## 2015-12-01 LAB — CBC
HCT: 30 % — ABNORMAL LOW (ref 36.0–46.0)
Hemoglobin: 9.1 g/dL — ABNORMAL LOW (ref 12.0–15.0)
MCH: 24.1 pg — ABNORMAL LOW (ref 26.0–34.0)
MCHC: 30.3 g/dL (ref 30.0–36.0)
MCV: 79.6 fL (ref 78.0–100.0)
PLATELETS: 314 10*3/uL (ref 150–400)
RBC: 3.77 MIL/uL — AB (ref 3.87–5.11)
RDW: 17.6 % — ABNORMAL HIGH (ref 11.5–15.5)
WBC: 11.3 10*3/uL — AB (ref 4.0–10.5)

## 2015-12-01 LAB — MAGNESIUM: MAGNESIUM: 2.1 mg/dL (ref 1.7–2.4)

## 2015-12-01 MED ORDER — IPRATROPIUM-ALBUTEROL 0.5-2.5 (3) MG/3ML IN SOLN
3.0000 mL | Freq: Three times a day (TID) | RESPIRATORY_TRACT | Status: DC
Start: 2015-12-01 — End: 2015-12-02
  Administered 2015-12-01 – 2015-12-02 (×3): 3 mL via RESPIRATORY_TRACT
  Filled 2015-12-01 (×3): qty 3

## 2015-12-01 NOTE — Discharge Instructions (Signed)
Information on my medicine - ELIQUIS (apixaban)  This medication education was reviewed with me or my healthcare representative as part of my discharge preparation.    Why was Eliquis prescribed for you? Eliquis was prescribed to treat blood clots that may have been found in the veins of your legs (deep vein thrombosis) or in your lungs (pulmonary embolism) and to reduce the risk of them occurring again.  What do You need to know about Eliquis ? The starting dose is 10 mg (two 5 mg tablets) taken TWICE daily for the FIRST SEVEN (7) DAYS, then on 12/07/15 the dose is reduced to ONE 5 mg tablet taken TWICE daily.  Eliquis may be taken with or without food.   Try to take the dose about the same time in the morning and in the evening. If you have difficulty swallowing the tablet whole please discuss with your pharmacist how to take the medication safely.  Take Eliquis exactly as prescribed and DO NOT stop taking Eliquis without talking to the doctor who prescribed the medication.  Stopping may increase your risk of developing a new blood clot.  Refill your prescription before you run out.  After discharge, you should have regular check-up appointments with your healthcare provider that is prescribing your Eliquis.    What do you do if you miss a dose? If a dose of ELIQUIS is not taken at the scheduled time, take it as soon as possible on the same day and twice-daily administration should be resumed. The dose should not be doubled to make up for a missed dose.  Important Safety Information A possible side effect of Eliquis is bleeding. You should call your healthcare provider right away if you experience any of the following: ? Bleeding from an injury or your nose that does not stop. ? Unusual colored urine (red or dark brown) or unusual colored stools (red or black). ? Unusual bruising for unknown reasons. ? A serious fall or if you hit your head (even if there is no bleeding).  Some  medicines may interact with Eliquis and might increase your risk of bleeding or clotting while on Eliquis. To help avoid this, consult your healthcare provider or pharmacist prior to using any new prescription or non-prescription medications, including herbals, vitamins, non-steroidal anti-inflammatory drugs (NSAIDs) and supplements.  This website has more information on Eliquis (apixaban): http://[email or web address].com/eliquis/home

## 2015-12-01 NOTE — Evaluation (Signed)
Physical Therapy Evaluation Patient Details Name: [name] MRN: [id number] DOB: December 01, 1955 Today's Date: 12/01/2015   History of Present Illness  60 yo female admitted on 11/27/15 with sepsis, Acute respiratory failure, Acute Pulmonary embolism. PMH significant for Addison's disease, ganglioylioma with brain surgery, anxiety.    Clinical Impression  Pt presents with the above diagnosis and below deficits which is limited her ability to safely return home. Pt's husband is her PCG and he assists with most ADLs and IADLs but pt does report that she is able to dress herself some during the day. Currently, pt is unable to get further than EOB with therapy due to anxiety and increased c/o dizziness. Pt is left in bed and would benefit from attempting a transfer via the STEADY at next therapy session. Pt is a good candidate for SNF placement due to below deficits in order to assist with return to PLOF. Pt will benefit from being seen acutely for improved strength and mobility.     Follow Up Recommendations SNF    Equipment Recommendations  None recommended by PT    Recommendations for Other Services OT consult     Precautions / Restrictions Precautions Precautions: Fall Restrictions Weight Bearing Restrictions: No      Mobility  Bed Mobility Overal bed mobility: Needs Assistance Bed Mobility: Supine to Sit     Supine to sit: Mod assist     General bed mobility comments: Mod A for supine to sit x2 Pt becomes dizzy and has to lay back down  Transfers                 General transfer comment: attempted sit to stand with steady and without, pt unable to perform due to dizziness.   Ambulation/Gait                Stairs            Wheelchair Mobility    Modified Rankin (Stroke Patients Only)       Balance Overall balance assessment: Needs assistance Sitting-balance support: Bilateral upper extremity supported;Feet supported Sitting balance-Leahy Scale:  Fair   Postural control: Posterior lean                                   Pertinent Vitals/Pain Pain Assessment: No/denies pain    Home Living Family/patient expects to be discharged to:: Private residence Living Arrangements: Spouse/significant other Available Help at Discharge: Family;Home health Type of Home: House Home Access: Level entry;Ramped entrance     Home Layout: One level Home Equipment: Wheelchair - manual;Grab bars - tub/shower;Bedside commode;Walker - 2 wheels;Shower seat Additional Comments: uses bed side commode because bathroom is not accessible    Prior Function Level of Independence: Needs assistance   Gait / Transfers Assistance Needed: getting out of bed, getting out of chair and short distance walk 2-3 steps to wc   ADL's / Homemaking Assistance Needed: sba for dressing, eat out majority, husband helps with cleaning  Comments: mainly from bed to chair and spends majority of day in chair     Hand Dominance   Dominant Hand: Right    Extremity/Trunk Assessment   Upper Extremity Assessment: Generalized weakness           Lower Extremity Assessment: Generalized weakness         Communication   Communication: No difficulties  Cognition Arousal/Alertness: Awake/alert Behavior During Therapy: Anxious;WFL for tasks assessed/performed Overall  Cognitive Status: Within Functional Limits for tasks assessed                      General Comments      Exercises General Exercises - Lower Extremity Ankle Circles/Pumps: AROM;20 reps;Both;Supine Quad Sets: AROM;Both;10 reps;Supine Heel Slides: AROM;Both;10 reps;Supine   Assessment/Plan    PT Assessment Patient needs continued PT services  PT Problem List Decreased strength;Decreased activity tolerance;Decreased balance;Decreased mobility;Decreased knowledge of use of DME;Obesity          PT Treatment Interventions DME instruction;Gait training;Functional mobility  training;Therapeutic activities;Therapeutic exercise;Balance training;Patient/family education    PT Goals (Current goals can be found in the Care Plan section)  Acute Rehab PT Goals Patient Stated Goal: to get home PT Goal Formulation: With patient Potential to Achieve Goals: Good    Frequency Min 3X/week   Barriers to discharge        Co-evaluation               End of Session Equipment Utilized During Treatment: Gait belt Activity Tolerance: Treatment limited secondary to medical complications (Comment) Patient left: in bed;with call bell/phone within reach Nurse Communication: Mobility status;Need for lift equipment         Time: [id number] PT Time Calculation (min) (ACUTE ONLY): 44 min   Charges:   PT Evaluation $PT Eval High Complexity: 1 Procedure PT Treatments $Therapeutic Exercise: 8-22 mins $Therapeutic Activity: 23-37 mins   PT G Codes:        Scheryl Marten PT, DPT  [phone]  12/01/2015, 2:19 PM

## 2015-12-01 NOTE — Progress Notes (Signed)
PROGRESS NOTE  [name] [id number] DOB: Dec 20, 1955 DOA: 11/27/2015 PCP: Neena Rhymes, MD  Brief History: 60 y.o.femalewith a medical history of Addison's disease, brain cancer, seizure disorder, hypothyroidism, presented to the emergency department with complaints of generalized weakness, fatigue, and diarrhea. Patient stated that she started having diarrhea early am 11/20/17and feeling upset her stomach. Per husband, at approximately 1 AM this morning, patient was having a bowel movement, when he noticed that she started to stare off into space and was unresponsive to him. Episode lasted approximately 60 seconds. Husband states the patient has had seizures related to her brain cancer however since removal the seizures have improved. Patient iss on generic Keppra.Notably, the patient has been off of her hydrocortisone and levothyroxine for the past 3 weeks due to some communication mixup with her endocrinologist. In the emergency department, the patient was noted to have a temperature up to 101.28F with tachycardia and soft blood pressures of 95/56. The patient was started on intravenous vancomycin and cefepime and IV fluids.  Assessment/Plan: Sepsis -suspect adrenal insufficiency may be contributing to much of this picture as she has been off her usual hydrocortisone -underlying pneumonia also contributing -Lactic acid Peaked at 5.7-->1.3 -Continue IV fluids-->saline locked -Initially received vancomycin and Zosyn in the emergency department -Continued vancomycin and cefepime pending culture data -de-escalated to azithromycin -Urinalysis negative for pyuria -Chest x-ray negative for consolidation -CTA chest with bilateral ground glass opacities  Diarrhea -Suspect that this may be related to the patient's recent Maalox use -She normally suffers with constipation -Has not had a bowel movement since admission -resolved  Acute respiratory failure with  hypoxia -Secondary to pulmonary embolus -11/28/2015 CTAchest--possible left upper lobe pulmonary artery filling defects,with scattered groundglass opacities -Procalcitonin 0.49-->0.66 -now stable on RA, but sats marginal 90-93%  Acute pulmonary embolus -11/28/2015 CTAchest--possible left upper lobe pulmonary artery filling defects,with scattered groundglass opacities -started IV heparin 11/22 -11/30/15 transition to apixaban this evening -given immobility, tachycardia, hypoxia--plan to treat as PE -lower extremity venous duplex--neg for DVT -11/29/15 echo--EF 55-60%, no WMA, grade 1 DD, trivial TR  Pulmonary Opacities--lobar pneumonia -pt presented with fever and dyspnea -Procalcitonin 0.49-->0.66-->0.36 -plan 5 days of abx -d/c vanco and cefepime (11/21>>>11/24) -azithromycin x 1 day more  Addison's disease -Case was discussed with the patient's endocrinologist, Dr. Milagros Reap was supposed to continue on levothyroxine and continue morning dose of hydrocortisone -plan to d/c with Cortef 30 mg daily -Restart levothyroxine after discussion with Dr. Lucianne Muss  Seizure disorder -Appreciateneurology--no additional AEDs for now -Continue Keppra -EEG--no epileptiform potential -MRI brain--no acute infarct, stable left frontal resection cavity, no new mass. -no seizure in 2 years  History of ganglioglioma -follows neurology at [facility] -History of resection in 1993 with left temporal lobe resection 2004 -whole brain radiation 2006  Generalized weakness -Likely multifactorial including sepsis versus hypothyroidism and Addison's disease. As noted above, patient has not been on her medications last several weeks. -Continue supportive care at this time. -PT eval-->SNF  Bacteruria -unclear clinical significance without any pyuria  Anxiety -vistaril 25 mg bid prn anxiety     Disposition Plan: SNF 11/26 or 11/27  Family Communication: Husband updated on  phone 11/25   Consultants: neurology  Code Status: FULL   DVT Prophylaxis: -IV heparin   Subjective: Overall breathing better but becomes dyspneic with exertion working with physical therapy. Still feels a little anxious. Denies any fevers, chills, chest pain, nausea, vomiting, diarrhea, abdominal pain. No dysuria  or hematuria. No rashes.  Objective: Vitals:   12/01/15 0500 12/01/15 0832 12/01/15 1419 12/01/15 1449  BP: (!) 145/76   (!) 154/70  Pulse: 95   (!) 117  Resp: 18     Temp: 98.3 F (36.8 C)   98.1 F (36.7 C)  TempSrc: Oral   Oral  SpO2: 92% 93% 90% 93%  Weight:      Height:        Intake/Output Summary (Last 24 hours) at 12/01/15 1622 Last data filed at 12/01/15 1132  Gross per 24 hour  Intake              360 ml  Output              350 ml  Net               10 ml   Weight change: -0.136 kg (-4.8 oz) Exam:   General:  Pt is alert, follows commands appropriately, not in acute distress  HEENT: No icterus, No thrush, No neck mass, [place]/AT  Cardiovascular: RRR, S1/S2, no rubs, no gallops  Respiratory: Bibasilar rales. No wheezing. Good air movement.  Abdomen: Soft/+BS, non tender, non distended, no guarding  Extremities: trace LE edema, No lymphangitis, No petechiae, No rashes, no synovitis   Data Reviewed: I have personally reviewed following labs and imaging studies Basic Metabolic Panel:  Recent Labs Lab 11/27/15 1445 11/28/15 0045 11/29/15 0332 11/30/15 0509 12/01/15 0432  NA 140 141 142 143 139  K 3.7 3.8 3.3* 3.5 4.3  CL 105 110 110 108 103  CO2 21* 24 23 27 28   GLUCOSE 146* 162* 107* 93 122*  BUN 9 9 6  <5* 6  CREATININE 1.01* 0.73 0.81 0.71 0.80  CALCIUM 9.7 8.1* 8.4* 8.4* 8.8*  MG  --   --   --  1.5* 2.1   Liver Function Tests:  Recent Labs Lab 11/27/15 1445  AST 40  ALT 23  ALKPHOS 55  BILITOT 0.6  PROT 6.9  ALBUMIN 3.5   No results for input(s): LIPASE, AMYLASE in the last 168 hours. No results for input(s):  AMMONIA in the last 168 hours. Coagulation Profile:  Recent Labs Lab 11/27/15 1445 11/27/15 2203  INR 1.18 1.24   CBC:  Recent Labs Lab 11/27/15 1445 11/27/15 2203 11/28/15 0045 11/29/15 0332 11/30/15 0509 12/01/15 0432  WBC 13.9* 9.5 9.7 6.6 5.4 11.3*  NEUTROABS 9.7* 8.6*  --   --   --   --   HGB 12.1 9.8* 9.6* 9.1* 8.9* 9.1*  HCT 39.9 33.2* 32.1* 30.6* 30.0* 30.0*  MCV 80.8 81.0 80.9 80.5 81.1 79.6  PLT 484* 291 296 273 272 314   Cardiac Enzymes: No results for input(s): CKTOTAL, CKMB, CKMBINDEX, TROPONINI in the last 168 hours. BNP: Invalid input(s): POCBNP CBG:  Recent Labs Lab 11/29/15 1623  GLUCAP 99   HbA1C: No results for input(s): HGBA1C in the last 72 hours. Urine analysis:    Component Value Date/Time   COLORURINE AMBER (A) 11/27/2015 1608   APPEARANCEUR CLEAR 11/27/2015 1608   LABSPEC 1.029 11/27/2015 1608   PHURINE 6.5 11/27/2015 1608   GLUCOSEU NEGATIVE 11/27/2015 1608   HGBUR NEGATIVE 11/27/2015 1608   HGBUR negative 08/27/2009 1308   BILIRUBINUR SMALL (A) 11/27/2015 1608   BILIRUBINUR Neg 03/24/2013 0958   KETONESUR 15 (A) 11/27/2015 1608   PROTEINUR 30 (A) 11/27/2015 1608   UROBILINOGEN 1.0 11/05/2014 2240   NITRITE NEGATIVE 11/27/2015 1608   LEUKOCYTESUR SMALL (A)  11/27/2015 1608   Sepsis Labs: @LABRCNTIP (procalcitonin:4,lacticidven:4) ) Recent Results (from the past 240 hour(s))  Culture, blood (Routine x 2)     Status: None (Preliminary result)   Collection Time: 11/27/15  2:42 PM  Result Value Ref Range Status   Specimen Description BLOOD RIGHT WRIST  Final   Special Requests BOTTLES DRAWN AEROBIC AND ANAEROBIC 5CC  Final   Culture NO GROWTH 4 DAYS  Final   Report Status PENDING  Incomplete  Culture, blood (Routine x 2)     Status: None (Preliminary result)   Collection Time: 11/27/15  3:43 PM  Result Value Ref Range Status   Specimen Description BLOOD RIGHT WRIST  Final   Special Requests BOTTLES DRAWN AEROBIC AND ANAEROBIC  5CC  Final   Culture NO GROWTH 4 DAYS  Final   Report Status PENDING  Incomplete  Urine culture     Status: Abnormal   Collection Time: 11/27/15  4:05 PM  Result Value Ref Range Status   Specimen Description URINE, CATHETERIZED  Final   Special Requests NONE  Final   Culture >=100,000 COLONIES/mL ENTEROCOCCUS FAECALIS (A)  Final   Report Status 11/30/2015 FINAL  Final   Organism ID, Bacteria ENTEROCOCCUS FAECALIS (A)  Final      Susceptibility   Enterococcus faecalis - MIC*    AMPICILLIN <=2 SENSITIVE Sensitive     LEVOFLOXACIN >=8 RESISTANT Resistant     NITROFURANTOIN <=16 SENSITIVE Sensitive     VANCOMYCIN 1 SENSITIVE Sensitive     * >=100,000 COLONIES/mL ENTEROCOCCUS FAECALIS  MRSA PCR Screening     Status: None   Collection Time: 11/27/15  9:39 PM  Result Value Ref Range Status   MRSA by PCR NEGATIVE NEGATIVE Final    Comment:        The GeneXpert MRSA Assay (FDA approved for NASAL specimens only), is one component of a comprehensive MRSA colonization surveillance program. It is not intended to diagnose MRSA infection nor to guide or monitor treatment for MRSA infections.      Scheduled Meds: . apixaban  10 mg Oral BID   Followed by  . [START ON 12/07/2015] apixaban  5 mg Oral BID  . aspirin EC  81 mg Oral Daily  . azithromycin  500 mg Oral Daily  . DULoxetine  60 mg Oral Daily  . fenofibrate  160 mg Oral Daily  . fesoterodine  8 mg Oral Daily  . hydrocortisone  30 mg Oral Daily  . ipratropium-albuterol  3 mL Nebulization TID  . levETIRAcetam  1,500 mg Oral BID  . pantoprazole  40 mg Oral Daily  . pregabalin  150 mg Oral BID  . rosuvastatin  10 mg Oral Q M,W,F   Continuous Infusions:  Procedures/Studies: Ct Angio Chest Pe W Or Wo Contrast  Result Date: 11/28/2015 CLINICAL DATA:  Shortness of breath for 3 weeks worse in the last few days EXAM: CT ANGIOGRAPHY CHEST WITH CONTRAST TECHNIQUE: Multidetector CT imaging of the chest was performed using the  standard protocol during bolus administration of intravenous contrast. Multiplanar CT image reconstructions and MIPs were obtained to evaluate the vascular anatomy. CONTRAST:  80 mL Isovue 370 intravenous COMPARISON:  Chest x-ray 11/28/2015 FINDINGS: Cardiovascular: Suboptimal contrast bolus for evaluation of pulmonary embolus. No gross filling defects within the main pulmonary arteries. There are no obvious filling defects within the central segmental pulmonary arteries on the right. Questionable small filling defects within left upper lobe segmental pulmonary artery, series 4, image number 31. Assessment of RV  LV ratio limited due to imaging planes. This is estimated to be less than 0.9. Aorta demonstrates no evidence for dissection. Minimal calcification is present. Heart is upper normal in size. No pericardial effusion. Mediastinum/Nodes: Thyroid gland within normal limits. Prominent mediastinal fat. Sub cm lymph nodes within the mediastinum. No axillary adenopathy. Mild diffuse air distention of the esophagus. Lungs/Pleura: Bilateral ground-glass densities within the lung fields. Bandlike atelectasis in the right upper lobe. No significant pleural effusion. Upper Abdomen: No acute findings in the upper abdomen. Probable pill fragment in the distal stomach. Musculoskeletal: No acute osseous abnormality. Review of the MIP images confirms the above findings. IMPRESSION: 1. Limited examination secondary to suboptimal contrast bolus and poor opacification of the pulmonary arteries. Possible small filling defect within segmental left upper lobe pulmonary artery, small thrombus cannot be excluded. No definite evidence for right heart strain. 2. Bilateral scattered areas of ground-glass density, could relate to edema, infection, or small airways disease. Bandlike atelectasis in the right upper lobe. No pleural effusion. These results will be called to the ordering clinician or representative by the Radiologist  Assistant, and communication documented in the PACS or zVision Dashboard. Electronically Signed   By: Jasmine Pang M.D.   On: 11/28/2015 15:31   Mr Brain Wo Contrast  Result Date: 11/29/2015 CLINICAL DATA:  60 y/o F; history of Addison's disease, brain cancer post resection and radiation, seizure disorder, hypothyroidism, presenting to the emergency department with generalized weakness. EXAM: MRI HEAD WITHOUT CONTRAST TECHNIQUE: Multiplanar, multiecho pulse sequences of the brain and surrounding structures were obtained without intravenous contrast. COMPARISON:  11/06/2015 MRI of the head. FINDINGS: Brain: No diffusion signal abnormality. Partially empty sella turcica. Large left frontal lobe resection cavity is stable in size. Stable ex vacuo dilatation of frontal horn of left lateral ventricle. Stable confluent T2 FLAIR hyperintense signal abnormality throughout white matter in the left-greater-than-right hemispheres of the brain. Stable mild parenchymal volume loss of the brain. No new focal mass effect. No new susceptibility hypointensity to indicate interval intracranial hemorrhage. No extra-axial collection. No effacement of basilar cisterns. Vascular: Normal flow voids. Skull and upper cervical spine: Stable postsurgical changes related to left frontal craniotomy. Sinuses/Orbits: Negative. Other: None. IMPRESSION: 1. No evidence for acute infarct, new mass effect, or intracranial hemorrhage. 2. Left frontal resection cavity is stable. Confluent T2 FLAIR hyperintense white matter changes of the left-greater-than-right hemispheres is also stable in distribution and consistent with posttreatment change. 3. Stable brain parenchymal volume loss. Electronically Signed   By: Mitzi Hansen M.D.   On: 11/29/2015 23:54   Dg Chest Port 1 View  Result Date: 11/30/2015 CLINICAL DATA:  Pneumonia. EXAM: PORTABLE CHEST 1 VIEW COMPARISON:  Chest CTA and radiographs 11/28/2015 FINDINGS: The  cardiomediastinal silhouette is unchanged. The lungs remain hypoinflated. Minimal left basilar opacity likely reflects atelectasis. Small focus of curvilinear opacity in the right lung also likely represents atelectasis. No overt pulmonary edema, sizable pleural effusion, or pneumothorax is identified. IMPRESSION: Low lung volumes with minimal atelectasis. Electronically Signed   By: Sebastian Ache M.D.   On: 11/30/2015 07:09   Dg Chest Port 1 View  Result Date: 11/28/2015 CLINICAL DATA:  Sepsis, followup EXAM: PORTABLE CHEST 1 VIEW COMPARISON:  Portable chest x-ray of 11/27/2015 FINDINGS: Again the lungs are poorly aerated although aeration has improved slightly. Mild left basilar linear atelectasis is present. No effusion is noted. Mediastinal and hilar contours are unchanged and the heart is within normal limits in size. No bony abnormality is seen. IMPRESSION:  Slightly improved aeration.  Mild left basilar linear atelectasis. Electronically Signed   By: Dwyane Dee M.D.   On: 11/28/2015 08:07   Dg Chest Port 1 View  Result Date: 11/27/2015 CLINICAL DATA:  Fever EXAM: PORTABLE CHEST 1 VIEW COMPARISON:  November 05, 2014 FINDINGS: The degree of inspiration is shallow. There is atelectatic change in the right upper and lower lobe regions. Lungs elsewhere clear. Heart is upper normal in size with pulmonary vascularity within normal limits. No adenopathy. No bone lesions. IMPRESSION: Shallow degree of inspiration. Patchy atelectasis on the right. No edema or consolidation. Stable cardiac silhouette. Electronically Signed   By: Bretta Bang III M.D.   On: 11/27/2015 16:18    Nikita Surman, DO  [facility] Pager [phone]  If 7PM-7AM, please contact night-coverage [email or web address] Password [email or web address] 12/01/2015, 4:22 PM   LOS: 4 days

## 2015-12-02 LAB — CULTURE, BLOOD (ROUTINE X 2)
CULTURE: NO GROWTH
Culture: NO GROWTH

## 2015-12-02 LAB — CBC
HEMATOCRIT: 29.8 % — AB (ref 36.0–46.0)
HEMOGLOBIN: 8.9 g/dL — AB (ref 12.0–15.0)
MCH: 24.2 pg — ABNORMAL LOW (ref 26.0–34.0)
MCHC: 29.9 g/dL — AB (ref 30.0–36.0)
MCV: 81 fL (ref 78.0–100.0)
Platelets: 269 10*3/uL (ref 150–400)
RBC: 3.68 MIL/uL — AB (ref 3.87–5.11)
RDW: 18.2 % — ABNORMAL HIGH (ref 11.5–15.5)
WBC: 6.8 10*3/uL (ref 4.0–10.5)

## 2015-12-02 LAB — PROCALCITONIN: Procalcitonin: 0.16 ng/mL

## 2015-12-02 MED ORDER — SENNA 8.6 MG PO TABS
2.0000 | ORAL_TABLET | Freq: Every day | ORAL | Status: DC
  Administered 2015-12-02 – 2015-12-03 (×2): 17.2 mg via ORAL
  Filled 2015-12-02 (×2): qty 2

## 2015-12-02 MED ORDER — DOCUSATE SODIUM 100 MG PO CAPS
100.0000 mg | ORAL_CAPSULE | Freq: Two times a day (BID) | ORAL | Status: DC
  Administered 2015-12-02 – 2015-12-03 (×3): 100 mg via ORAL
  Filled 2015-12-02 (×4): qty 1

## 2015-12-02 NOTE — Discharge Summary (Signed)
Physician Discharge Summary  [name]:[id number] DOB: 09/14/1955 DOA: 11/27/2015  PCP: Neena Rhymes, MD  Admit date: 11/27/2015 Discharge date: 12/03/15 Admitted From: Home Disposition:  SNF  Recommendations for Outpatient Follow-up:  1. Follow up with PCP in 1-2 weeks 2. Please obtain BMP/CBC in one week    Discharge Condition:Stable CODE STATUS: FULL Diet recommendation: Heart Healthy   Brief/Interim Summary: 60 y.o.femalewith a medical history of Addison's disease, brain cancer, seizure disorder, hypothyroidism, presented to the emergency department with complaints of generalized weakness, fatigue, and diarrhea. Patient stated that she started having diarrhea early am 11/20/17and feeling upset her stomach. Per husband, at approximately 1 AM this morning, patient was having a bowel movement, when he noticed that she started to stare off into space and was unresponsive to him. Episode lasted approximately 60 seconds. Husband states the patient has had seizures related to her brain cancer however since removal the seizures have improved. Patient iss on generic Keppra.Notably, the patient has been off of her hydrocortisone and levothyroxine for the past 3 weeks due to some communication mixup with her endocrinologist. In the emergency department, the patient was noted to have a temperature up to 101.48F with tachycardia and soft blood pressures of 95/56. The patient was started on intravenous vancomycin and cefepime and IV fluids.  She was de-escalated to azithromycin and finished 5 days of abx.  Due to hypoxia and tachycardia, CTA of chest was obtained and it was concerning for PE.  Pt was started on IV heparin which was subsequently transitioned to apixaban without complications.   PT was consulted and recommended SNF to which the patient agreed.    Discharge Diagnoses:  Sepsis -suspect adrenal insufficiency may be contributing to much of this picture as she has been off  her usual hydrocortisone for 3 weeks prior to admission -underlying pneumonia also contributing -Lactic acid Peaked at 5.7-->1.3 -Continue IV fluids-->saline locked -Initially received vancomycin and Zosyn in the emergency department -Continuedvancomycin and cefepime pending culture data -de-escalated to azithromycin -finished 6 days of abx during the admission -Urinalysis negative for pyuria -Chest x-ray negative for consolidation -CTA chest with bilateral ground glass opacities and possible PE  Diarrhea -Suspect that this may be related to the patient's recent Maalox use -She normally suffers with constipation -No diarrhea since admission -resolved  Acute respiratory failure with hypoxia -Secondary to pulmonary embolus -11/28/2015 CTAchest--possible left upper lobe pulmonary artery filling defects,with scattered groundglass opacities -Procalcitonin 0.49-->0.66-->0.16 -now stable on RA, but sats marginal 90-94%  Acute pulmonary embolus -11/28/2015 CTAchest--possible left upper lobe pulmonary artery filling defects,with scattered groundglass opacities -started IV heparin 11/22 -11/30/15 transition to apixaban-->Hgb stable without signs of infection -given immobility, tachycardia, hypoxia--plan to treat as PE -lower extremity venous duplex--neg for DVT -11/29/15 echo--EF 55-60%, no WMA, grade 1 DD, trivial TR  Pulmonary Opacities--lobar pneumonia -pt presented with fever and dyspnea -Procalcitonin 0.49-->0.66-->0.36 -finished 6 days of abx -d/c vanco and cefepime (11/21>>>11/24) -finished azithromycin x 3  Addison's disease -Case was discussed with the patient's endocrinologist, Dr. Milagros Reap was supposed to continue on levothyroxine 100 mcg daily and continue morning dose of hydrocortisone -d/c with Cortef 30 mg daily -Restart levothyroxine after discussion with Dr. Lucianne Muss  Seizure disorder -Appreciateneurology consult--no additional AEDs for  now -Continue Keppra -EEG--no epileptiform potential -MRI brain--no acute infarct, stable left frontal resection cavity, no new mass. -no seizure in 2 years -no further seizure activity during the admission  History of ganglioglioma -follows neurology at [facility] -History of resection in 1993  with left temporal lobe resection 2004 -whole brain radiation 2006  Generalized weakness -Likely multifactorial including sepsis versus hypothyroidism and Addison's disease. As noted above, patient has not been on her medications last several weeks. -Continue supportive care at this time. -PT eval-->SNF  Bacteruria -unclear clinical significance without any pyuria  Anxiety -vistaril 25mg  bid prn anxiety  HTN -numerous readings of SBP 150-160 and DBP >90 during the admission -start metoprolol tartrate 12.5 mg bid     Discharge Instructions     Medication List    TAKE these medications   apixaban 5 MG Tabs tablet Commonly known as:  ELIQUIS Take 2 tablets (10 mg total) by mouth 2 (two) times daily. Through 12/07/15 10 AM dose. Then 1 tablet (5 mg ) twice daily starting 2200 on 12/07/15.   aspirin 81 MG EC tablet Take 1 tablet (81 mg total) by mouth daily.   aspirin-acetaminophen-caffeine 250-250-65 MG tablet Commonly known as:  EXCEDRIN MIGRAINE Take by mouth every 6 (six) hours as needed for headache.   CITRACAL + D PO Take 1 tablet by mouth daily.   DULoxetine 60 MG capsule Commonly known as:  CYMBALTA Take 1 capsule (60 mg total) by mouth daily.   fenofibrate 160 MG tablet Take 1 tablet (160 mg total) by mouth daily.   hydrocortisone 10 MG tablet Commonly known as:  CORTEF Take 3 tablets (30 mg total) by mouth daily. What changed:  See the new instructions.   hydrOXYzine 25 MG tablet Commonly known as:  ATARAX/VISTARIL Take 1 tablet (25 mg total) by mouth 3 (three) times daily as needed for anxiety.   KEPPRA 500 MG tablet Generic drug:  levETIRAcetam Take  3 tablets (1,500 mg total) by mouth 2 (two) times daily.   levothyroxine 100 MCG tablet Commonly known as:  SYNTHROID, LEVOTHROID Take 1 tablet (100 mcg total) by mouth daily. What changed:  how much to take   methocarbamol 500 MG tablet Commonly known as:  ROBAXIN Take 1 tablet (500 mg total) by mouth 3 (three) times daily as needed for muscle spasms.   metoprolol tartrate 25 MG tablet Commonly known as:  LOPRESSOR Take 0.5 tablets (12.5 mg total) by mouth 2 (two) times daily.   omeprazole 20 MG capsule Commonly known as:  PRILOSEC TAKE 1 CAPSULE BY MOUTH DAILY   pregabalin 150 MG capsule Commonly known as:  LYRICA Take 1 capsule (150 mg total) by mouth 2 (two) times daily.   promethazine 25 MG tablet Commonly known as:  PHENERGAN Take 1 tablet (25 mg total) by mouth every 6 (six) hours as needed for nausea or vomiting.   rosuvastatin 10 MG tablet Commonly known as:  CRESTOR TAKE 1 TABLET BY MOUTH  EVERY MONDAY, WEDNESDAY,  FRIDAY   traZODone 50 MG tablet Commonly known as:  DESYREL Take 0.5-1 tablets (25-50 mg total) by mouth at bedtime as needed for sleep. What changed:  how much to take  when to take this       Allergies  Allergen Reactions  . Versed [Midazolam] Other (See Comments) and Anaphylaxis    Severe respiratory depression, had to be masked in preop holding.  . Ciprofloxacin Nausea And Vomiting  . Cyclobenzaprine Itching    Other reaction(s): Itching / Pruritis (ALLERGY/intolerance)  . Methocarbamol Itching    REACTION: itching   . Metronidazole Other (See Comments)    Unknown   . Penicillins Rash    Consultations:  Reather Littler on phone   Procedures/Studies: Ct Angio Chest Pe W  Or Wo Contrast  Result Date: 11/28/2015 CLINICAL DATA:  Shortness of breath for 3 weeks worse in the last few days EXAM: CT ANGIOGRAPHY CHEST WITH CONTRAST TECHNIQUE: Multidetector CT imaging of the chest was performed using the standard protocol during bolus  administration of intravenous contrast. Multiplanar CT image reconstructions and MIPs were obtained to evaluate the vascular anatomy. CONTRAST:  80 mL Isovue 370 intravenous COMPARISON:  Chest x-ray 11/28/2015 FINDINGS: Cardiovascular: Suboptimal contrast bolus for evaluation of pulmonary embolus. No gross filling defects within the main pulmonary arteries. There are no obvious filling defects within the central segmental pulmonary arteries on the right. Questionable small filling defects within left upper lobe segmental pulmonary artery, series 4, image number 31. Assessment of RV LV ratio limited due to imaging planes. This is estimated to be less than 0.9. Aorta demonstrates no evidence for dissection. Minimal calcification is present. Heart is upper normal in size. No pericardial effusion. Mediastinum/Nodes: Thyroid gland within normal limits. Prominent mediastinal fat. Sub cm lymph nodes within the mediastinum. No axillary adenopathy. Mild diffuse air distention of the esophagus. Lungs/Pleura: Bilateral ground-glass densities within the lung fields. Bandlike atelectasis in the right upper lobe. No significant pleural effusion. Upper Abdomen: No acute findings in the upper abdomen. Probable pill fragment in the distal stomach. Musculoskeletal: No acute osseous abnormality. Review of the MIP images confirms the above findings. IMPRESSION: 1. Limited examination secondary to suboptimal contrast bolus and poor opacification of the pulmonary arteries. Possible small filling defect within segmental left upper lobe pulmonary artery, small thrombus cannot be excluded. No definite evidence for right heart strain. 2. Bilateral scattered areas of ground-glass density, could relate to edema, infection, or small airways disease. Bandlike atelectasis in the right upper lobe. No pleural effusion. These results will be called to the ordering clinician or representative by the Radiologist Assistant, and communication documented  in the PACS or zVision Dashboard. Electronically Signed   By: Jasmine Pang M.D.   On: 11/28/2015 15:31   Mr Brain Wo Contrast  Result Date: 11/29/2015 CLINICAL DATA:  60 y/o F; history of Addison's disease, brain cancer post resection and radiation, seizure disorder, hypothyroidism, presenting to the emergency department with generalized weakness. EXAM: MRI HEAD WITHOUT CONTRAST TECHNIQUE: Multiplanar, multiecho pulse sequences of the brain and surrounding structures were obtained without intravenous contrast. COMPARISON:  11/06/2015 MRI of the head. FINDINGS: Brain: No diffusion signal abnormality. Partially empty sella turcica. Large left frontal lobe resection cavity is stable in size. Stable ex vacuo dilatation of frontal horn of left lateral ventricle. Stable confluent T2 FLAIR hyperintense signal abnormality throughout white matter in the left-greater-than-right hemispheres of the brain. Stable mild parenchymal volume loss of the brain. No new focal mass effect. No new susceptibility hypointensity to indicate interval intracranial hemorrhage. No extra-axial collection. No effacement of basilar cisterns. Vascular: Normal flow voids. Skull and upper cervical spine: Stable postsurgical changes related to left frontal craniotomy. Sinuses/Orbits: Negative. Other: None. IMPRESSION: 1. No evidence for acute infarct, new mass effect, or intracranial hemorrhage. 2. Left frontal resection cavity is stable. Confluent T2 FLAIR hyperintense white matter changes of the left-greater-than-right hemispheres is also stable in distribution and consistent with posttreatment change. 3. Stable brain parenchymal volume loss. Electronically Signed   By: Mitzi Hansen M.D.   On: 11/29/2015 23:54   Dg Chest Port 1 View  Result Date: 11/30/2015 CLINICAL DATA:  Pneumonia. EXAM: PORTABLE CHEST 1 VIEW COMPARISON:  Chest CTA and radiographs 11/28/2015 FINDINGS: The cardiomediastinal silhouette is unchanged. The lungs  remain hypoinflated. Minimal left basilar opacity likely reflects atelectasis. Small focus of curvilinear opacity in the right lung also likely represents atelectasis. No overt pulmonary edema, sizable pleural effusion, or pneumothorax is identified. IMPRESSION: Low lung volumes with minimal atelectasis. Electronically Signed   By: Sebastian Ache M.D.   On: 11/30/2015 07:09   Dg Chest Port 1 View  Result Date: 11/28/2015 CLINICAL DATA:  Sepsis, followup EXAM: PORTABLE CHEST 1 VIEW COMPARISON:  Portable chest x-ray of 11/27/2015 FINDINGS: Again the lungs are poorly aerated although aeration has improved slightly. Mild left basilar linear atelectasis is present. No effusion is noted. Mediastinal and hilar contours are unchanged and the heart is within normal limits in size. No bony abnormality is seen. IMPRESSION: Slightly improved aeration.  Mild left basilar linear atelectasis. Electronically Signed   By: Dwyane Dee M.D.   On: 11/28/2015 08:07   Dg Chest Port 1 View  Result Date: 11/27/2015 CLINICAL DATA:  Fever EXAM: PORTABLE CHEST 1 VIEW COMPARISON:  November 05, 2014 FINDINGS: The degree of inspiration is shallow. There is atelectatic change in the right upper and lower lobe regions. Lungs elsewhere clear. Heart is upper normal in size with pulmonary vascularity within normal limits. No adenopathy. No bone lesions. IMPRESSION: Shallow degree of inspiration. Patchy atelectasis on the right. No edema or consolidation. Stable cardiac silhouette. Electronically Signed   By: Bretta Bang III M.D.   On: 11/27/2015 16:18        Discharge Exam: Vitals:   12/02/15 2135 12/03/15 0523  BP: (!) 157/89 (!) 152/77  Pulse: (!) 104 88  Resp: 18 20  Temp: 98.2 F (36.8 C) 98.1 F (36.7 C)   Vitals:   12/02/15 0539 12/02/15 0859 12/02/15 2135 12/03/15 0523  BP:   (!) 157/89 (!) 152/77  Pulse:   (!) 104 88  Resp:   18 20  Temp:   98.2 F (36.8 C) 98.1 F (36.7 C)  TempSrc:   Oral Oral  SpO2:   94% 90% 93%  Weight: 110.2 kg (242 lb 14.4 oz)   107.1 kg (236 lb 3.2 oz)  Height:        General: Pt is alert, awake, not in acute distress Cardiovascular: RRR, S1/S2 +, no rubs, no gallops Respiratory: bibasilar rales, no wheeze Abdominal: Soft, NT, ND, bowel sounds + Extremities: trace LE edema, no cyanosis   The results of significant diagnostics from this hospitalization (including imaging, microbiology, ancillary and laboratory) are listed below for reference.    Significant Diagnostic Studies: Ct Angio Chest Pe W Or Wo Contrast  Result Date: 11/28/2015 CLINICAL DATA:  Shortness of breath for 3 weeks worse in the last few days EXAM: CT ANGIOGRAPHY CHEST WITH CONTRAST TECHNIQUE: Multidetector CT imaging of the chest was performed using the standard protocol during bolus administration of intravenous contrast. Multiplanar CT image reconstructions and MIPs were obtained to evaluate the vascular anatomy. CONTRAST:  80 mL Isovue 370 intravenous COMPARISON:  Chest x-ray 11/28/2015 FINDINGS: Cardiovascular: Suboptimal contrast bolus for evaluation of pulmonary embolus. No gross filling defects within the main pulmonary arteries. There are no obvious filling defects within the central segmental pulmonary arteries on the right. Questionable small filling defects within left upper lobe segmental pulmonary artery, series 4, image number 31. Assessment of RV LV ratio limited due to imaging planes. This is estimated to be less than 0.9. Aorta demonstrates no evidence for dissection. Minimal calcification is present. Heart is upper normal in size. No pericardial effusion. Mediastinum/Nodes: Thyroid gland within  normal limits. Prominent mediastinal fat. Sub cm lymph nodes within the mediastinum. No axillary adenopathy. Mild diffuse air distention of the esophagus. Lungs/Pleura: Bilateral ground-glass densities within the lung fields. Bandlike atelectasis in the right upper lobe. No significant pleural  effusion. Upper Abdomen: No acute findings in the upper abdomen. Probable pill fragment in the distal stomach. Musculoskeletal: No acute osseous abnormality. Review of the MIP images confirms the above findings. IMPRESSION: 1. Limited examination secondary to suboptimal contrast bolus and poor opacification of the pulmonary arteries. Possible small filling defect within segmental left upper lobe pulmonary artery, small thrombus cannot be excluded. No definite evidence for right heart strain. 2. Bilateral scattered areas of ground-glass density, could relate to edema, infection, or small airways disease. Bandlike atelectasis in the right upper lobe. No pleural effusion. These results will be called to the ordering clinician or representative by the Radiologist Assistant, and communication documented in the PACS or zVision Dashboard. Electronically Signed   By: Jasmine Pang M.D.   On: 11/28/2015 15:31   Mr Brain Wo Contrast  Result Date: 11/29/2015 CLINICAL DATA:  60 y/o F; history of Addison's disease, brain cancer post resection and radiation, seizure disorder, hypothyroidism, presenting to the emergency department with generalized weakness. EXAM: MRI HEAD WITHOUT CONTRAST TECHNIQUE: Multiplanar, multiecho pulse sequences of the brain and surrounding structures were obtained without intravenous contrast. COMPARISON:  11/06/2015 MRI of the head. FINDINGS: Brain: No diffusion signal abnormality. Partially empty sella turcica. Large left frontal lobe resection cavity is stable in size. Stable ex vacuo dilatation of frontal horn of left lateral ventricle. Stable confluent T2 FLAIR hyperintense signal abnormality throughout white matter in the left-greater-than-right hemispheres of the brain. Stable mild parenchymal volume loss of the brain. No new focal mass effect. No new susceptibility hypointensity to indicate interval intracranial hemorrhage. No extra-axial collection. No effacement of basilar cisterns.  Vascular: Normal flow voids. Skull and upper cervical spine: Stable postsurgical changes related to left frontal craniotomy. Sinuses/Orbits: Negative. Other: None. IMPRESSION: 1. No evidence for acute infarct, new mass effect, or intracranial hemorrhage. 2. Left frontal resection cavity is stable. Confluent T2 FLAIR hyperintense white matter changes of the left-greater-than-right hemispheres is also stable in distribution and consistent with posttreatment change. 3. Stable brain parenchymal volume loss. Electronically Signed   By: Mitzi Hansen M.D.   On: 11/29/2015 23:54   Dg Chest Port 1 View  Result Date: 11/30/2015 CLINICAL DATA:  Pneumonia. EXAM: PORTABLE CHEST 1 VIEW COMPARISON:  Chest CTA and radiographs 11/28/2015 FINDINGS: The cardiomediastinal silhouette is unchanged. The lungs remain hypoinflated. Minimal left basilar opacity likely reflects atelectasis. Small focus of curvilinear opacity in the right lung also likely represents atelectasis. No overt pulmonary edema, sizable pleural effusion, or pneumothorax is identified. IMPRESSION: Low lung volumes with minimal atelectasis. Electronically Signed   By: Sebastian Ache M.D.   On: 11/30/2015 07:09   Dg Chest Port 1 View  Result Date: 11/28/2015 CLINICAL DATA:  Sepsis, followup EXAM: PORTABLE CHEST 1 VIEW COMPARISON:  Portable chest x-ray of 11/27/2015 FINDINGS: Again the lungs are poorly aerated although aeration has improved slightly. Mild left basilar linear atelectasis is present. No effusion is noted. Mediastinal and hilar contours are unchanged and the heart is within normal limits in size. No bony abnormality is seen. IMPRESSION: Slightly improved aeration.  Mild left basilar linear atelectasis. Electronically Signed   By: Dwyane Dee M.D.   On: 11/28/2015 08:07   Dg Chest Port 1 View  Result Date: 11/27/2015 CLINICAL DATA:  Fever  EXAM: PORTABLE CHEST 1 VIEW COMPARISON:  November 05, 2014 FINDINGS: The degree of inspiration is  shallow. There is atelectatic change in the right upper and lower lobe regions. Lungs elsewhere clear. Heart is upper normal in size with pulmonary vascularity within normal limits. No adenopathy. No bone lesions. IMPRESSION: Shallow degree of inspiration. Patchy atelectasis on the right. No edema or consolidation. Stable cardiac silhouette. Electronically Signed   By: Bretta Bang III M.D.   On: 11/27/2015 16:18     Microbiology: Recent Results (from the past 240 hour(s))  Culture, blood (Routine x 2)     Status: None   Collection Time: 11/27/15  2:42 PM  Result Value Ref Range Status   Specimen Description BLOOD RIGHT WRIST  Final   Special Requests BOTTLES DRAWN AEROBIC AND ANAEROBIC 5CC  Final   Culture NO GROWTH 5 DAYS  Final   Report Status 12/02/2015 FINAL  Final  Culture, blood (Routine x 2)     Status: None   Collection Time: 11/27/15  3:43 PM  Result Value Ref Range Status   Specimen Description BLOOD RIGHT WRIST  Final   Special Requests BOTTLES DRAWN AEROBIC AND ANAEROBIC 5CC  Final   Culture NO GROWTH 5 DAYS  Final   Report Status 12/02/2015 FINAL  Final  Urine culture     Status: Abnormal   Collection Time: 11/27/15  4:05 PM  Result Value Ref Range Status   Specimen Description URINE, CATHETERIZED  Final   Special Requests NONE  Final   Culture >=100,000 COLONIES/mL ENTEROCOCCUS FAECALIS (A)  Final   Report Status 11/30/2015 FINAL  Final   Organism ID, Bacteria ENTEROCOCCUS FAECALIS (A)  Final      Susceptibility   Enterococcus faecalis - MIC*    AMPICILLIN <=2 SENSITIVE Sensitive     LEVOFLOXACIN >=8 RESISTANT Resistant     NITROFURANTOIN <=16 SENSITIVE Sensitive     VANCOMYCIN 1 SENSITIVE Sensitive     * >=100,000 COLONIES/mL ENTEROCOCCUS FAECALIS  MRSA PCR Screening     Status: None   Collection Time: 11/27/15  9:39 PM  Result Value Ref Range Status   MRSA by PCR NEGATIVE NEGATIVE Final    Comment:        The GeneXpert MRSA Assay (FDA approved for NASAL  specimens only), is one component of a comprehensive MRSA colonization surveillance program. It is not intended to diagnose MRSA infection nor to guide or monitor treatment for MRSA infections.      Labs: Basic Metabolic Panel:  Recent Labs Lab 11/28/15 0045 11/29/15 0332 11/30/15 0509 12/01/15 0432 12/03/15 0731  NA 141 142 143 139 143  K 3.8 3.3* 3.5 4.3 3.5  CL 110 110 108 103 104  CO2 24 23 27 28 30   GLUCOSE 162* 107* 93 122* 83  BUN 9 6 <5* 6 11  CREATININE 0.73 0.81 0.71 0.80 0.74  CALCIUM 8.1* 8.4* 8.4* 8.8* 8.7*  MG  --   --  1.5* 2.1  --    Liver Function Tests:  Recent Labs Lab 11/27/15 1445  AST 40  ALT 23  ALKPHOS 55  BILITOT 0.6  PROT 6.9  ALBUMIN 3.5   No results for input(s): LIPASE, AMYLASE in the last 168 hours. No results for input(s): AMMONIA in the last 168 hours. CBC:  Recent Labs Lab 11/27/15 1445 11/27/15 2203  11/29/15 0332 11/30/15 0509 12/01/15 0432 12/02/15 0541 12/03/15 0731  WBC 13.9* 9.5  < > 6.6 5.4 11.3* 6.8 7.4  NEUTROABS 9.7*  8.6*  --   --   --   --   --   --   HGB 12.1 9.8*  < > 9.1* 8.9* 9.1* 8.9* 9.2*  HCT 39.9 33.2*  < > 30.6* 30.0* 30.0* 29.8* 31.4*  MCV 80.8 81.0  < > 80.5 81.1 79.6 81.0 81.3  PLT 484* 291  < > 273 272 314 269 280  < > = values in this interval not displayed. Cardiac Enzymes: No results for input(s): CKTOTAL, CKMB, CKMBINDEX, TROPONINI in the last 168 hours. BNP: Invalid input(s): POCBNP CBG:  Recent Labs Lab 11/29/15 1623  GLUCAP 99    Time coordinating discharge:  Greater than 30 minutes  Signed:  Nneoma Harral, DO [facility] Pager: [phone] 12/03/2015, 9:53 AM

## 2015-12-02 NOTE — Progress Notes (Signed)
PROGRESS NOTE  [name]:[id number] DOB: [birth year] DOA: 11/27/2015 PCP: Neena Rhymes, MD  Brief History: 60 y.o.femalewith a medical history of Addison's disease, brain cancer, seizure disorder, hypothyroidism, presented to the emergency department with complaints of generalized weakness, fatigue, and diarrhea. Patient stated that she started having diarrhea early am 11/20/17and feeling upset her stomach. Per husband, at approximately 1 AM this morning, patient was having a bowel movement, when he noticed that she started to stare off into space and was unresponsive to him. Episode lasted approximately 60 seconds. Husband states the patient has had seizures related to her brain cancer however since removal the seizures have improved. Patient iss on generic Keppra.Notably, the patient has been off of her hydrocortisone and levothyroxine for the past 3 weeks due to some communication mixup with her endocrinologist. In the emergency department, the patient was noted to have a temperature up to 101.71F with tachycardia and soft blood pressures of 95/56. The patient was started on intravenous vancomycin and cefepime and IV fluids.  Assessment/Plan: Sepsis -suspect adrenal insufficiency may be contributing to much of this picture as she has been off her usual hydrocortisone -underlying pneumonia also contributing -Lactic acid Peaked at 5.7-->1.3 -Continue IV fluids-->saline locked -Initially received vancomycin and Zosyn in the emergency department -Continued vancomycin and cefepime pending culture data -de-escalated to azithromycin -Urinalysis negative for pyuria -Chest x-ray negative for consolidation -CTA chest with bilateral ground glass opacities and possible PE  Diarrhea -Suspect that this may be related to the patient's recent Maalox use -She normally suffers with constipation -Has not had a bowel movement since admission -resolved  Acute respiratory failure  with hypoxia -Secondary to pulmonary embolus -11/28/2015 CTAchest--possible left upper lobe pulmonary artery filling defects,with scattered groundglass opacities -Procalcitonin 0.49-->0.66-->0.16 -now stable on RA, but sats marginal 90-94%  Acute pulmonary embolus -11/28/2015 CTAchest--possible left upper lobe pulmonary artery filling defects,with scattered groundglass opacities -started IV heparin 11/22 -11/30/15 transition to apixaban  -given immobility, tachycardia, hypoxia--plan to treat as PE -lower extremity venous duplex--neg for DVT -11/29/15 echo--EF 55-60%, no WMA, grade 1 DD, trivial TR  Pulmonary Opacities--lobar pneumonia -pt presented with fever and dyspnea -Procalcitonin 0.49-->0.66-->0.36 -finished 5 days of abx -d/c vanco and cefepime (11/21>>>11/24) -finished azithromycin x 3  Addison's disease -Case was discussed with the patient's endocrinologist, Dr. Milagros Reap was supposed to continue on levothyroxine and continue morning dose of hydrocortisone -plan to d/c with Cortef 30 mg daily -Restart levothyroxine after discussion with Dr. Lucianne Muss  Seizure disorder -Appreciateneurology--no additional AEDs for now -Continue Keppra -EEG--no epileptiform potential -MRI brain--no acute infarct, stable left frontal resection cavity, no new mass. -no seizure in 2 years  History of ganglioglioma -follows neurology at [facility] -History of resection in 1993 with left temporal lobe resection 2004 -whole brain radiation 2006  Generalized weakness -Likely multifactorial including sepsis versus hypothyroidism and Addison's disease. As noted above, patient has not been on her medications last several weeks. -Continue supportive care at this time. -PT eval-->SNF  Bacteruria -unclear clinical significance without any pyuria  Anxiety -vistaril 25 mg bid prn anxiety     Disposition Plan: SNF  11/27 if stable Family Communication: Husband updated  at bedside 11/26   Consultants: neurology  Code Status: FULL   DVT Prophylaxis: -IV heparin  Subjective: Patient denies fevers, chills, headache, chest pain, dyspnea, nausea, vomiting, diarrhea, abdominal pain, dysuria, hematuria, hematochezia, and melena.   Objective: Vitals:   12/01/15 2045 12/02/15 0505 12/02/15 8119  12/02/15 0859  BP: (!) 155/84 (!) 159/87    Pulse: 96 90    Resp: (!) 22 20    Temp: 98.8 F (37.1 C) 97.5 F (36.4 C)    TempSrc: Oral Oral    SpO2: 93% 90%  94%  Weight:   110.2 kg (242 lb 14.4 oz)   Height:        Intake/Output Summary (Last 24 hours) at 12/02/15 1746 Last data filed at 12/01/15 1946  Gross per 24 hour  Intake                0 ml  Output              175 ml  Net             -175 ml   Weight change: -2.676 kg (-5 lb 14.4 oz) Exam:   General:  Pt is alert, follows commands appropriately, not in acute distress  HEENT: No icterus, No thrush, No neck mass, [place]/AT  Cardiovascular: RRR, S1/S2, no rubs, no gallops  Respiratory: bibasilar crackles, no wheeze  Abdomen: Soft/+BS, non tender, non distended, no guarding  Extremities: trace edema, No lymphangitis, No petechiae, No rashes, no synovitis   Data Reviewed: I have personally reviewed following labs and imaging studies Basic Metabolic Panel:  Recent Labs Lab 11/27/15 1445 11/28/15 0045 11/29/15 0332 11/30/15 0509 12/01/15 0432  NA 140 141 142 143 139  K 3.7 3.8 3.3* 3.5 4.3  CL 105 110 110 108 103  CO2 21* 24 23 27 28   GLUCOSE 146* 162* 107* 93 122*  BUN 9 9 6  <5* 6  CREATININE 1.01* 0.73 0.81 0.71 0.80  CALCIUM 9.7 8.1* 8.4* 8.4* 8.8*  MG  --   --   --  1.5* 2.1   Liver Function Tests:  Recent Labs Lab 11/27/15 1445  AST 40  ALT 23  ALKPHOS 55  BILITOT 0.6  PROT 6.9  ALBUMIN 3.5   No results for input(s): LIPASE, AMYLASE in the last 168 hours. No results for input(s): AMMONIA in the last 168 hours. Coagulation Profile:  Recent Labs Lab  11/27/15 1445 11/27/15 2203  INR 1.18 1.24   CBC:  Recent Labs Lab 11/27/15 1445 11/27/15 2203 11/28/15 0045 11/29/15 0332 11/30/15 0509 12/01/15 0432 12/02/15 0541  WBC 13.9* 9.5 9.7 6.6 5.4 11.3* 6.8  NEUTROABS 9.7* 8.6*  --   --   --   --   --   HGB 12.1 9.8* 9.6* 9.1* 8.9* 9.1* 8.9*  HCT 39.9 33.2* 32.1* 30.6* 30.0* 30.0* 29.8*  MCV 80.8 81.0 80.9 80.5 81.1 79.6 81.0  PLT 484* 291 296 273 272 314 269   Cardiac Enzymes: No results for input(s): CKTOTAL, CKMB, CKMBINDEX, TROPONINI in the last 168 hours. BNP: Invalid input(s): POCBNP CBG:  Recent Labs Lab 11/29/15 1623  GLUCAP 99   HbA1C: No results for input(s): HGBA1C in the last 72 hours. Urine analysis:    Component Value Date/Time   COLORURINE AMBER (A) 11/27/2015 1608   APPEARANCEUR CLEAR 11/27/2015 1608   LABSPEC 1.029 11/27/2015 1608   PHURINE 6.5 11/27/2015 1608   GLUCOSEU NEGATIVE 11/27/2015 1608   HGBUR NEGATIVE 11/27/2015 1608   HGBUR negative 08/27/2009 1308   BILIRUBINUR SMALL (A) 11/27/2015 1608   BILIRUBINUR Neg 03/24/2013 0958   KETONESUR 15 (A) 11/27/2015 1608   PROTEINUR 30 (A) 11/27/2015 1608   UROBILINOGEN 1.0 11/05/2014 2240   NITRITE NEGATIVE 11/27/2015 1608   LEUKOCYTESUR SMALL (A) 11/27/2015 1608  Sepsis Labs: @LABRCNTIP (procalcitonin:4,lacticidven:4) ) Recent Results (from the past 240 hour(s))  Culture, blood (Routine x 2)     Status: None   Collection Time: 11/27/15  2:42 PM  Result Value Ref Range Status   Specimen Description BLOOD RIGHT WRIST  Final   Special Requests BOTTLES DRAWN AEROBIC AND ANAEROBIC 5CC  Final   Culture NO GROWTH 5 DAYS  Final   Report Status 12/02/2015 FINAL  Final  Culture, blood (Routine x 2)     Status: None   Collection Time: 11/27/15  3:43 PM  Result Value Ref Range Status   Specimen Description BLOOD RIGHT WRIST  Final   Special Requests BOTTLES DRAWN AEROBIC AND ANAEROBIC 5CC  Final   Culture NO GROWTH 5 DAYS  Final   Report Status  12/02/2015 FINAL  Final  Urine culture     Status: Abnormal   Collection Time: 11/27/15  4:05 PM  Result Value Ref Range Status   Specimen Description URINE, CATHETERIZED  Final   Special Requests NONE  Final   Culture >=100,000 COLONIES/mL ENTEROCOCCUS FAECALIS (A)  Final   Report Status 11/30/2015 FINAL  Final   Organism ID, Bacteria ENTEROCOCCUS FAECALIS (A)  Final      Susceptibility   Enterococcus faecalis - MIC*    AMPICILLIN <=2 SENSITIVE Sensitive     LEVOFLOXACIN >=8 RESISTANT Resistant     NITROFURANTOIN <=16 SENSITIVE Sensitive     VANCOMYCIN 1 SENSITIVE Sensitive     * >=100,000 COLONIES/mL ENTEROCOCCUS FAECALIS  MRSA PCR Screening     Status: None   Collection Time: 11/27/15  9:39 PM  Result Value Ref Range Status   MRSA by PCR NEGATIVE NEGATIVE Final    Comment:        The GeneXpert MRSA Assay (FDA approved for NASAL specimens only), is one component of a comprehensive MRSA colonization surveillance program. It is not intended to diagnose MRSA infection nor to guide or monitor treatment for MRSA infections.      Scheduled Meds: . apixaban  10 mg Oral BID   Followed by  . [START ON 12/07/2015] apixaban  5 mg Oral BID  . aspirin EC  81 mg Oral Daily  . docusate sodium  100 mg Oral BID  . DULoxetine  60 mg Oral Daily  . fenofibrate  160 mg Oral Daily  . fesoterodine  8 mg Oral Daily  . hydrocortisone  30 mg Oral Daily  . levETIRAcetam  1,500 mg Oral BID  . pantoprazole  40 mg Oral Daily  . pregabalin  150 mg Oral BID  . rosuvastatin  10 mg Oral Q M,W,F  . senna  2 tablet Oral Daily   Continuous Infusions:  Procedures/Studies: Ct Angio Chest Pe W Or Wo Contrast  Result Date: 11/28/2015 CLINICAL DATA:  Shortness of breath for 3 weeks worse in the last few days EXAM: CT ANGIOGRAPHY CHEST WITH CONTRAST TECHNIQUE: Multidetector CT imaging of the chest was performed using the standard protocol during bolus administration of intravenous contrast. Multiplanar  CT image reconstructions and MIPs were obtained to evaluate the vascular anatomy. CONTRAST:  80 mL Isovue 370 intravenous COMPARISON:  Chest x-ray 11/28/2015 FINDINGS: Cardiovascular: Suboptimal contrast bolus for evaluation of pulmonary embolus. No gross filling defects within the main pulmonary arteries. There are no obvious filling defects within the central segmental pulmonary arteries on the right. Questionable small filling defects within left upper lobe segmental pulmonary artery, series 4, image number 31. Assessment of RV LV ratio limited due to  imaging planes. This is estimated to be less than 0.9. Aorta demonstrates no evidence for dissection. Minimal calcification is present. Heart is upper normal in size. No pericardial effusion. Mediastinum/Nodes: Thyroid gland within normal limits. Prominent mediastinal fat. Sub cm lymph nodes within the mediastinum. No axillary adenopathy. Mild diffuse air distention of the esophagus. Lungs/Pleura: Bilateral ground-glass densities within the lung fields. Bandlike atelectasis in the right upper lobe. No significant pleural effusion. Upper Abdomen: No acute findings in the upper abdomen. Probable pill fragment in the distal stomach. Musculoskeletal: No acute osseous abnormality. Review of the MIP images confirms the above findings. IMPRESSION: 1. Limited examination secondary to suboptimal contrast bolus and poor opacification of the pulmonary arteries. Possible small filling defect within segmental left upper lobe pulmonary artery, small thrombus cannot be excluded. No definite evidence for right heart strain. 2. Bilateral scattered areas of ground-glass density, could relate to edema, infection, or small airways disease. Bandlike atelectasis in the right upper lobe. No pleural effusion. These results will be called to the ordering clinician or representative by the Radiologist Assistant, and communication documented in the PACS or zVision Dashboard. Electronically  Signed   By: Jasmine Pang M.D.   On: 11/28/2015 15:31   Mr Brain Wo Contrast  Result Date: 11/29/2015 CLINICAL DATA:  60 y/o F; history of Addison's disease, brain cancer post resection and radiation, seizure disorder, hypothyroidism, presenting to the emergency department with generalized weakness. EXAM: MRI HEAD WITHOUT CONTRAST TECHNIQUE: Multiplanar, multiecho pulse sequences of the brain and surrounding structures were obtained without intravenous contrast. COMPARISON:  11/06/2015 MRI of the head. FINDINGS: Brain: No diffusion signal abnormality. Partially empty sella turcica. Large left frontal lobe resection cavity is stable in size. Stable ex vacuo dilatation of frontal horn of left lateral ventricle. Stable confluent T2 FLAIR hyperintense signal abnormality throughout white matter in the left-greater-than-right hemispheres of the brain. Stable mild parenchymal volume loss of the brain. No new focal mass effect. No new susceptibility hypointensity to indicate interval intracranial hemorrhage. No extra-axial collection. No effacement of basilar cisterns. Vascular: Normal flow voids. Skull and upper cervical spine: Stable postsurgical changes related to left frontal craniotomy. Sinuses/Orbits: Negative. Other: None. IMPRESSION: 1. No evidence for acute infarct, new mass effect, or intracranial hemorrhage. 2. Left frontal resection cavity is stable. Confluent T2 FLAIR hyperintense white matter changes of the left-greater-than-right hemispheres is also stable in distribution and consistent with posttreatment change. 3. Stable brain parenchymal volume loss. Electronically Signed   By: Mitzi Hansen M.D.   On: 11/29/2015 23:54   Dg Chest Port 1 View  Result Date: 11/30/2015 CLINICAL DATA:  Pneumonia. EXAM: PORTABLE CHEST 1 VIEW COMPARISON:  Chest CTA and radiographs 11/28/2015 FINDINGS: The cardiomediastinal silhouette is unchanged. The lungs remain hypoinflated. Minimal left basilar opacity  likely reflects atelectasis. Small focus of curvilinear opacity in the right lung also likely represents atelectasis. No overt pulmonary edema, sizable pleural effusion, or pneumothorax is identified. IMPRESSION: Low lung volumes with minimal atelectasis. Electronically Signed   By: Sebastian Ache M.D.   On: 11/30/2015 07:09   Dg Chest Port 1 View  Result Date: 11/28/2015 CLINICAL DATA:  Sepsis, followup EXAM: PORTABLE CHEST 1 VIEW COMPARISON:  Portable chest x-ray of 11/27/2015 FINDINGS: Again the lungs are poorly aerated although aeration has improved slightly. Mild left basilar linear atelectasis is present. No effusion is noted. Mediastinal and hilar contours are unchanged and the heart is within normal limits in size. No bony abnormality is seen. IMPRESSION: Slightly improved aeration.  Mild  left basilar linear atelectasis. Electronically Signed   By: Dwyane Dee M.D.   On: 11/28/2015 08:07   Dg Chest Port 1 View  Result Date: 11/27/2015 CLINICAL DATA:  Fever EXAM: PORTABLE CHEST 1 VIEW COMPARISON:  November 05, 2014 FINDINGS: The degree of inspiration is shallow. There is atelectatic change in the right upper and lower lobe regions. Lungs elsewhere clear. Heart is upper normal in size with pulmonary vascularity within normal limits. No adenopathy. No bone lesions. IMPRESSION: Shallow degree of inspiration. Patchy atelectasis on the right. No edema or consolidation. Stable cardiac silhouette. Electronically Signed   By: Bretta Bang III M.D.   On: 11/27/2015 16:18    Julea Hutto, DO  [facility] Pager [phone]  If 7PM-7AM, please contact night-coverage [email or web address] Password [email or web address] 12/02/2015, 5:46 PM   LOS: 5 days

## 2015-12-03 LAB — BASIC METABOLIC PANEL
Anion gap: 9 (ref 5–15)
BUN: 11 mg/dL (ref 6–20)
CHLORIDE: 104 mmol/L (ref 101–111)
CO2: 30 mmol/L (ref 22–32)
CREATININE: 0.74 mg/dL (ref 0.44–1.00)
Calcium: 8.7 mg/dL — ABNORMAL LOW (ref 8.9–10.3)
GFR calc Af Amer: >60 mL/min (ref >60)
GFR calc non Af Amer: >60 mL/min (ref >60)
GLUCOSE: 83 mg/dL (ref 65–99)
POTASSIUM: 3.5 mmol/L (ref 3.5–5.1)
SODIUM: 143 mmol/L (ref 135–145)

## 2015-12-03 LAB — CBC
HCT: 31.4 % — ABNORMAL LOW (ref 36.0–46.0)
HEMOGLOBIN: 9.2 g/dL — AB (ref 12.0–15.0)
MCH: 23.8 pg — AB (ref 26.0–34.0)
MCHC: 29.3 g/dL — AB (ref 30.0–36.0)
MCV: 81.3 fL (ref 78.0–100.0)
PLATELETS: 280 10*3/uL (ref 150–400)
RBC: 3.86 MIL/uL — ABNORMAL LOW (ref 3.87–5.11)
RDW: 18 % — ABNORMAL HIGH (ref 11.5–15.5)
WBC: 7.4 10*3/uL (ref 4.0–10.5)

## 2015-12-03 MED ORDER — HYDROCORTISONE 10 MG PO TABS: 30.0000 mg | ORAL_TABLET | Freq: Every day | ORAL | 0 refills | Status: DC

## 2015-12-03 MED ORDER — APIXABAN 5 MG PO TABS: 10.0000 mg | ORAL_TABLET | Freq: Two times a day (BID) | ORAL | 0 refills | Status: DC

## 2015-12-03 MED ORDER — LEVOTHYROXINE SODIUM 100 MCG PO TABS: 100.0000 ug | ORAL_TABLET | Freq: Every day | ORAL | 0 refills | Status: DC

## 2015-12-03 MED ORDER — HYDROXYZINE HCL 25 MG PO TABS: 25.0000 mg | ORAL_TABLET | Freq: Three times a day (TID) | ORAL | 0 refills | Status: DC | PRN

## 2015-12-03 MED ORDER — METOPROLOL TARTRATE 25 MG PO TABS: 12.5000 mg | ORAL_TABLET | Freq: Two times a day (BID) | ORAL | 0 refills | Status: DC

## 2015-12-03 MED ORDER — METOPROLOL TARTRATE 12.5 MG HALF TABLET
12.5000 mg | ORAL_TABLET | Freq: Two times a day (BID) | ORAL | Status: DC
  Administered 2015-12-03: 12.5 mg via ORAL
  Filled 2015-12-03: qty 1

## 2015-12-03 NOTE — Progress Notes (Signed)
Patient will DC to: [facility] Anticipated DC date: 12/03/15 Family notified: Son Transport by: Domenica Reamer   Per MD patient ready for DC to [facility]. RN, patient, patient's family, and facility notified of DC. Discharge Summary sent to facility. RN given number for report. DC packet on chart. Ambulance transport requested for patient.   CSW signing off.  Cedric Fishman, [place] Social Worker [phone]

## 2015-12-03 NOTE — Care Management Note (Signed)
Case Management Note  Patient Details  Name: [name] MRN: [id number] Date of Birth: 1955/01/31  Subjective/Objective:                 Patient from home with spouse admitted with sepsis.    Action/Plan:  DC to SNF today.   Expected Discharge Date:                  Expected Discharge Plan:  Skilled Nursing Facility  In-House Referral:  Clinical Social Work  Discharge planning Services  CM Consult  Post Acute Care Choice:    Choice offered to:     DME Arranged:    DME Agency:     HH Arranged:    [place] Agency:     Status of Service:  Completed, signed off  If discussed at [employer], dates discussed:    Additional Comments:  Carles Collet, RN 12/03/2015, 1:27 PM

## 2015-12-03 NOTE — Clinical Social Work Placement (Signed)
   CLINICAL SOCIAL WORK PLACEMENT  NOTE  Date:  12/03/2015  Patient Details  Name: [name] MRN: [id number] Date of Birth: [birth year]  Clinical Social Work is seeking post-discharge placement for this patient at the [place] level of care (*CSW will initial, date and re-position this form in  chart as items are completed):  Yes   Patient/family provided with [place] Work Department's list of facilities offering this level of care within the geographic area requested by the patient (or if unable, by the patient's family).  Yes   Patient/family informed of their freedom to choose among providers that offer the needed level of care, that participate in Medicare, Medicaid or managed care program needed by the patient, have an available bed and are willing to accept the patient.  Yes   Patient/family informed of [facility]'s ownership interest in [facility] and [facility], as well as of the fact that they are under no obligation to receive care at these facilities.  PASRR submitted to EDS on 12/03/15     PASRR number received on 12/03/15     Existing PASRR number confirmed on       FL2 transmitted to all facilities in geographic area requested by pt/family on 12/03/15     FL2 transmitted to all facilities within larger geographic area on       Patient informed that his/her managed care company has contracts with or will negotiate with certain facilities, including the following:        Yes   Patient/family informed of bed offers received.  Patient chooses bed at [facility] [place]     Physician recommends and patient chooses bed at      Patient to be transferred to [facility] on 12/03/15.  Patient to be transferred to facility by [facility]     Patient family notified on 12/03/15 of transfer.  Name of family member notified:  Son     PHYSICIAN Please sign FL2     Additional Comment:     _______________________________________________ [name], [place] 12/03/2015, 12:15 PM

## 2015-12-03 NOTE — Evaluation (Signed)
Occupational Therapy Evaluation Patient Details Name: [name] MRN: [id number] DOB: [birth year] Today's Date: 12/03/2015    History of Present Illness 60 yo female admitted on 11/27/15 with sepsis, Acute respiratory failure, Acute Pulmonary embolism. PMH significant for Addison's disease, ganglioylioma with brain surgery, anxiety.     Clinical Impression   Patient presenting with decreased I in self care, balance, safety awareness, functional transfers, strength, and endurance.  Patient required assist from husband for functional transfers and ADLS PTA. Pt not ambulating only taking a few streps for transfers.  Patient currently functioning at mod A - +2. Patient will benefit from acute OT to increase overall independence in the areas of ADLs, functional mobility,and safety awareness in order to safely discharge to next venue of care.    Follow Up Recommendations  SNF;Supervision/Assistance - 24 hour    Equipment Recommendations  Other (comment) (defer to next venue of care)    Recommendations for Other Services       Precautions / Restrictions Precautions Precautions: Fall Restrictions Weight Bearing Restrictions: No         Balance Overall balance assessment: Needs assistance Sitting-balance support: Bilateral upper extremity supported;Feet supported Sitting balance-Leahy Scale: Poor   Postural control: Posterior lean                                  ADL Overall ADL's : Needs assistance/impaired       General ADL Comments: Total A to don B nonslip socks.                Pertinent Vitals/Pain Pain Assessment: No/denies pain     Hand Dominance Right   Extremity/Trunk Assessment Upper Extremity Assessment Upper Extremity Assessment: Generalized weakness   Lower Extremity Assessment Lower Extremity Assessment: Generalized weakness       Communication Communication Communication: No difficulties   Cognition Arousal/Alertness:  Awake/alert Behavior During Therapy: Anxious;WFL for tasks assessed/performed Overall Cognitive Status: Within Functional Limits for tasks assessed                                Home Living Family/patient expects to be discharged to:: Private residence Living Arrangements: Spouse/significant other Available Help at Discharge: Family;Home health Type of Home: House Home Access: Level entry;Ramped entrance     Home Layout: One level     Bathroom Shower/Tub: Occupational psychologist: Handicapped height Bathroom Accessibility: No   Home Equipment: Wheelchair - manual;Grab bars - tub/shower;Bedside commode;Walker - 2 wheels;Shower seat   Additional Comments: uses bed side commode because bathroom is not accessible      Prior Functioning/Environment Level of Independence: Needs assistance  Gait / Transfers Assistance Needed: getting out of bed, getting out of chair and short distance walk 2-3 steps to wc  ADL's / Homemaking Assistance Needed: sba for dressing, eat out majority, husband helps with cleaning   Comments: mainly from bed to chair and spends majority of day in chair        OT Problem List: Decreased strength;Decreased activity tolerance;Impaired balance (sitting and/or standing);Decreased safety awareness;Pain;Impaired UE functional use;Decreased coordination;Decreased range of motion;Decreased knowledge of use of DME or AE   OT Treatment/Interventions: Self-care/ADL training;Therapeutic exercise;Energy conservation;DME and/or AE instruction;Therapeutic activities;Balance training;Patient/family education    OT Goals(Current goals can be found in the care plan section) Acute Rehab OT Goals Patient Stated Goal: to get home  OT Goal Formulation: With patient/family Time For Goal Achievement: 12/17/15 Potential to Achieve Goals: Fair ADL Goals Pt Will Perform Grooming: with set-up Pt Will Perform Upper Body Bathing: with set-up Pt Will Perform  Lower Body Bathing: with min assist Pt Will Perform Upper Body Dressing: with set-up Pt Will Transfer to Toilet: with mod assist;bedside commode  OT Frequency: Min 2X/week   Barriers to D/C:    none known at this time          End of Session    Activity Tolerance: Patient limited by fatigue Patient left: in bed;with call bell/phone within reach;with bed alarm set;with family/visitor present   Time: 1212-1230 OT Time Calculation (min): 18 min Charges:  OT General Charges $OT Visit: 1 Procedure OT Evaluation $OT Eval Moderate Complexity: 1 Procedure (18) G-Codes:    Gypsy Decant, MS, OTR/L 12/03/2015, 12:40 PM

## 2015-12-03 NOTE — Progress Notes (Signed)
NURSING PROGRESS NOTE  [name] [id number] Discharge Data: 12/03/2015 2:14 PM Attending Provider: Orson Eva, MD [id number] Birdie Riddle, MD     Andree Coss to be D/C'd [facility] in [place] facility per MD order.   All IV's discontinued with no bleeding noted. All belongings returned to patient for patient to take home. Report was called to [place], Rn at [facility] in [employer]. Pt will be transported by stretcher via EMS to the facility.  Last Vital Signs:  Blood pressure (!) 152/77, pulse 88, temperature 98.1 F (36.7 C), temperature source Oral, resp. rate 20, height 5\' 2"  (1.575 m), weight 107.1 kg (236 lb 3.2 oz), SpO2 93 %.  Discharge Medication List   Medication List    TAKE these medications   apixaban 5 MG Tabs tablet Commonly known as:  ELIQUIS Take 2 tablets (10 mg total) by mouth 2 (two) times daily. Through 12/07/15 10 AM dose. Then 1 tablet (5 mg ) twice daily starting 2200 on 12/07/15.   aspirin 81 MG EC tablet Take 1 tablet (81 mg total) by mouth daily.   aspirin-acetaminophen-caffeine [id number] MG tablet Commonly known as:  EXCEDRIN MIGRAINE Take by mouth every 6 (six) hours as needed for headache.   CITRACAL + D PO Take 1 tablet by mouth daily.   DULoxetine 60 MG capsule Commonly known as:  CYMBALTA Take 1 capsule (60 mg total) by mouth daily.   fenofibrate 160 MG tablet Take 1 tablet (160 mg total) by mouth daily.   hydrocortisone 10 MG tablet Commonly known as:  CORTEF Take 3 tablets (30 mg total) by mouth daily. What changed:  See the new instructions.   hydrOXYzine 25 MG tablet Commonly known as:  ATARAX/VISTARIL Take 1 tablet (25 mg total) by mouth 3 (three) times daily as needed for anxiety.   KEPPRA 500 MG tablet Generic drug:  levETIRAcetam Take 3 tablets (1,500 mg total) by mouth 2 (two) times daily.   levothyroxine 100 MCG tablet Commonly known as:  SYNTHROID, LEVOTHROID Take 1 tablet (100 mcg total) by mouth  daily. What changed:  how much to take   methocarbamol 500 MG tablet Commonly known as:  ROBAXIN Take 1 tablet (500 mg total) by mouth 3 (three) times daily as needed for muscle spasms.   metoprolol tartrate 25 MG tablet Commonly known as:  LOPRESSOR Take 0.5 tablets (12.5 mg total) by mouth 2 (two) times daily.   omeprazole 20 MG capsule Commonly known as:  PRILOSEC TAKE 1 CAPSULE BY MOUTH DAILY   pregabalin 150 MG capsule Commonly known as:  LYRICA Take 1 capsule (150 mg total) by mouth 2 (two) times daily.   promethazine 25 MG tablet Commonly known as:  PHENERGAN Take 1 tablet (25 mg total) by mouth every 6 (six) hours as needed for nausea or vomiting.   rosuvastatin 10 MG tablet Commonly known as:  CRESTOR TAKE 1 TABLET BY MOUTH  EVERY MONDAY, WEDNESDAY,  FRIDAY   traZODone 50 MG tablet Commonly known as:  DESYREL Take 0.5-1 tablets (25-50 mg total) by mouth at bedtime as needed for sleep. What changed:  how much to take  when to take this

## 2015-12-03 NOTE — NC FL2 (Signed)
[place] LEVEL OF CARE SCREENING TOOL     IDENTIFICATION  Patient Name: [name] Birthdate: 21-Dec-1955 Sex: female Admission Date (Current Location): 11/27/2015  [facility] and [place] Number:  Herbalist and Address:  [facility], [place] [address], [place], [place] [address]      Provider Number: [id number]  Attending Physician Name and Address:  Orson Eva, MD  Relative Name and Phone Number:       Current Level of Care: Hospital Recommended Level of Care: [place] Prior Approval Number:    Date Approved/Denied:   PASRR Number: [id number]  Discharge Plan: SNF    Current Diagnoses: Patient Active Problem List   Diagnosis Date Noted  . Acute pulmonary embolus ([place]) 11/28/2015  . Acute respiratory failure with hypoxia ([place]) 11/28/2015  . Seizure disorder ([place]) 11/27/2015  . Adrenal insufficiency ([place]) 11/22/2014  . 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 Hypotension due to drugs   . Disorientation   . Urinary tract infection, site not specified   . Acute kidney injury ([place])   . Essential hypertension   . Epilepsy, grand mal ([place])   . Sepsis ([place]) 11/06/2014  . AKI (acute kidney injury) ([place]) 11/06/2014  . Stroke-like symptoms   . Yeast infection of the skin 09/22/2014  . Cognitive impairment 02/09/2014  . Gross motor impairment 02/09/2014  . Esophageal stricture 12/28/2013  . GI bleed 12/27/2013  . CVA (cerebral infarction) 12/27/2013  . Dysphagia, pharyngoesophageal phase 12/27/2013  . Dysphagia   . Pressure ulcer stage II 12/08/2013  . 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 HTN (hypertension) 08/31/2013  . Postop check 08/12/2013  . Pyelonephritis 07/31/2013  . Hepatitis 07/31/2013  . Acute hepatitis 07/31/2013  . UTI (urinary tract infection) 03/06/2013  . Hypotension 03/06/2013  . Weakness of both legs 03/06/2013  . UTI (lower urinary tract infection) 03/06/2013  . Anemia 03/06/2013  .  Weakness 03/06/2013  . Vaginal discharge 09/19/2011  . Right fibular fracture 07/12/2011  . Sinusitis, acute 03/14/2011  . Persistent disorder of initiating or maintaining sleep 03/14/2011  . Routine general medical examination at a health care facility 12/04/2010  . Screening for malignant neoplasm of the cervix 12/04/2010  . Migraine 08/30/2010  . Fibromyalgia 04/05/2010  . DEPRESSIVE DISORDER 11/27/2009  . MYALGIA 11/27/2009  . LEG CRAMPS 11/27/2009  . INSOMNIA-SLEEP DISORDER-UNSPEC 11/27/2009  . DYSURIA 08/27/2009  . FUNGAL DERMATITIS 07/16/2009  . Obesity 07/16/2009  . RHINITIS 06/28/2009  . Hyperlipidemia 10/16/2008  . 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 BACK PAIN, THORACIC REGION 09/18/2008    Orientation RESPIRATION BLADDER Height & Weight     Self, Time, Situation, Place  Normal Incontinent Weight: 107.1 kg (236 lb 3.2 oz) Height:  5\' 2"  (157.5 cm)  BEHAVIORAL SYMPTOMS/MOOD NEUROLOGICAL BOWEL NUTRITION STATUS    Convulsions/Seizures Continent Diet (Please see DC Summary)  AMBULATORY STATUS COMMUNICATION OF NEEDS Skin   Extensive Assist Verbally Normal                       Personal Care Assistance Level of Assistance  Bathing, Feeding, Dressing Bathing Assistance: Maximum assistance Feeding assistance: Independent Dressing Assistance: Limited assistance     Functional Limitations Info             SPECIAL CARE FACTORS FREQUENCY  PT (By licensed PT)     PT Frequency: 5x/week  Contractures      Additional Factors Info  Code Status, Allergies Code Status Info: Full Allergies Info:  Versed Midazolam, Ciprofloxacin, Cyclobenzaprine, Methocarbamol, Metronidazole, Penicillins           Current Medications (12/03/2015):  This is the current hospital active medication list Current Facility-Administered Medications  Medication Dose Route Frequency Provider Last Rate Last Dose  . acetaminophen (TYLENOL)  tablet 650 mg  650 mg Oral Q6H PRN Maryann Mikhail, DO   650 mg at 12/02/15 1002   Or  . acetaminophen (TYLENOL) suppository 650 mg  650 mg Rectal Q6H PRN Maryann Mikhail, DO      . apixaban (ELIQUIS) tablet 10 mg  10 mg Oral BID Assunta Found Stone, RPH   10 mg at 12/02/15 2248   Followed by  . [START ON 12/07/2015] apixaban (ELIQUIS) tablet 5 mg  5 mg Oral BID Ricka Burdock, RPH      . aspirin EC tablet 81 mg  81 mg Oral Daily Maryann Mikhail, DO   81 mg at 12/02/15 [id number]  . docusate sodium (COLACE) capsule 100 mg  100 mg Oral BID Orson Eva, MD   100 mg at 12/02/15 2248  . DULoxetine (CYMBALTA) DR capsule 60 mg  60 mg Oral Daily Maryann Mikhail, DO   60 mg at 12/02/15 [id number]  . fenofibrate tablet 160 mg  160 mg Oral Daily Maryann Mikhail, DO   160 mg at 12/02/15 [id number]  . fesoterodine (TOVIAZ) tablet 8 mg  8 mg Oral Daily Maryann Mikhail, DO   8 mg at 12/02/15 [id number]  . hydrocortisone (CORTEF) tablet 30 mg  30 mg Oral Daily Orson Eva, MD   30 mg at 12/02/15 0938  . hydrOXYzine (ATARAX/VISTARIL) tablet 25 mg  25 mg Oral TID PRN Orson Eva, MD   25 mg at 11/30/15 1412  . ipratropium-albuterol (DUONEB) 0.5-2.5 (3) MG/3ML nebulizer solution 3 mL  3 mL Nebulization Q4H PRN Orson Eva, MD      . levETIRAcetam (KEPPRA) tablet 1,500 mg  1,500 mg Oral BID Maryann Mikhail, DO   1,500 mg at 12/02/15 2249  . linaclotide (LINZESS) capsule 145 mcg  145 mcg Oral Daily PRN Maryann Mikhail, DO      . methocarbamol (ROBAXIN) tablet 500 mg  500 mg Oral TID PRN Maryann Mikhail, DO      . metoprolol tartrate (LOPRESSOR) tablet 12.5 mg  12.5 mg Oral BID Orson Eva, MD      . ondansetron [facility]) tablet 4 mg  4 mg Oral Q6H PRN Maryann Mikhail, DO       Or  . ondansetron (ZOFRAN) injection 4 mg  4 mg Intravenous Q6H PRN Maryann Mikhail, DO      . pantoprazole (PROTONIX) EC tablet 40 mg  40 mg Oral Daily Maryann Mikhail, DO   40 mg at 12/02/15 [id number]  . pregabalin (LYRICA) capsule 150 mg  150 mg Oral BID Maryann Mikhail, DO   150 mg  at 12/02/15 2249  . promethazine (PHENERGAN) tablet 25 mg  25 mg Oral Q6H PRN Maryann Mikhail, DO   25 mg at 12/02/15 1003  . rosuvastatin (CRESTOR) tablet 10 mg  10 mg Oral Q M,W,F Maryann Mikhail, DO   10 mg at 11/30/15 1007  . senna (SENOKOT) tablet 17.2 mg  2 tablet Oral Daily Orson Eva, MD   17.2 mg at 12/02/15 1002  . traZODone (DESYREL) tablet 25-50 mg  25-50 mg Oral QHS PRN Cristal Ford, DO  Discharge Medications: Please see discharge summary for a list of discharge medications.  Relevant Imaging Results:  Relevant Lab Results:   Additional Information SSN: [place], [place]

## 2015-12-04 ENCOUNTER — Telehealth: Payer: Self-pay

## 2015-12-04 NOTE — Telephone Encounter (Signed)
Spoke with patients husband, Shanon Brow.   Transition Care Management Follow-up Telephone Call   Date discharged? 12/03/15   How have you been since you were released from the hospital? "she's a lot better"   Do you understand why you were in the hospital? yes, "blood clot in her left lung and I messed up her medications"   Do you understand the discharge instructions? yes, unsure of medication changes.    Where were you discharged to? SNF, [facility] x approximately 2 weeks.    Items Reviewed:  Medications reviewed: yes, husband unclear regarding some medication changes. Meeting with SNF today.   Allergies reviewed: yes  Dietary changes reviewed: yes  Referrals reviewed: no   Functional Questionnaire:   Activities of Daily Living (ADLs):   She states they are independent in the following: none States they require assistance with the following: ambulation, bathing and hygiene, feeding, continence, grooming, toileting and dressing   Any transportation issues/concerns?: no   Any patient concerns? no   Confirmed importance and date/time of follow-up visits scheduled yes  Provider Appointment booked with Dr. Birdie Riddle on 12/17/15 at 11am.   Confirmed with patient if condition begins to worsen call PCP or go to the ER.  Patient was given the office number and encouraged to call back with question or concerns.  : yes

## 2015-12-17 ENCOUNTER — Ambulatory Visit: Payer: 59 | Admitting: Family Medicine

## 2015-12-24 ENCOUNTER — Other Ambulatory Visit: Payer: Self-pay | Admitting: Endocrinology

## 2015-12-24 DIAGNOSIS — E230 Hypopituitarism: Secondary | ICD-10-CM

## 2016-01-04 ENCOUNTER — Other Ambulatory Visit: Payer: 59

## 2016-01-08 ENCOUNTER — Ambulatory Visit (INDEPENDENT_AMBULATORY_CARE_PROVIDER_SITE_OTHER): Payer: 59 | Admitting: Endocrinology

## 2016-01-08 ENCOUNTER — Encounter: Payer: Self-pay | Admitting: Endocrinology

## 2016-01-08 VITALS — BP 122/100 | HR 101 | Ht 62.99 in | Wt 236.6 lb

## 2016-01-08 DIAGNOSIS — E230 Hypopituitarism: Secondary | ICD-10-CM | POA: Diagnosis not present

## 2016-01-08 LAB — COMPREHENSIVE METABOLIC PANEL
ALT: 13 U/L (ref 0–35)
AST: 14 U/L (ref 0–37)
Albumin: 4.1 g/dL (ref 3.5–5.2)
Alkaline Phosphatase: 65 U/L (ref 39–117)
BILIRUBIN TOTAL: 0.3 mg/dL (ref 0.2–1.2)
BUN: 18 mg/dL (ref 6–23)
CALCIUM: 9.6 mg/dL (ref 8.4–10.5)
CHLORIDE: 103 meq/L (ref 96–112)
CO2: 32 meq/L (ref 19–32)
Creatinine, Ser: 0.73 mg/dL (ref 0.40–1.20)
GFR: 86.93 mL/min (ref >60.00)
GLUCOSE: 108 mg/dL — AB (ref 70–99)
POTASSIUM: 3.9 meq/L (ref 3.5–5.1)
Sodium: 141 mEq/L (ref 135–145)
Total Protein: 7.2 g/dL (ref 6.0–8.3)

## 2016-01-08 LAB — TSH: TSH: 0.09 u[IU]/mL — AB (ref 0.35–4.50)

## 2016-01-08 LAB — T4, FREE: FREE T4: 1.49 ng/dL (ref 0.60–1.60)

## 2016-01-08 NOTE — Progress Notes (Signed)
Patient ID: [name], female   DOB: Jan 02, 1956, 61 y.o.   MRN: [id number]           Chief complaint:   Follow-up of Adrenal insufficiency and hypopituitarism  History of Present Illness:    The patient was diagnosed to have adrenal insufficiency when she was hospitalized on 11/05/14 with a syncopal episode and hypotension. 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  After her initial consultation she was switched to hydrocortisone with a maintenance dose of 20 mg in the morning and 10 mg in the late afternoon  Recent history: Because of her significant weight gain and mild cushingoid appearance her hydrocortisone was reduced to 10 mg twice a day in 5/17 and then further down to 10 mg once daily since her urine free cortisol was 26 while taking dexamethasone in 11/17 However she apparently may have stopped her hydrocortisone completely as her husband admission to start the instructions and apparently had problems with hypotension during her hospitalization for sepsis and pulmonary embolism in November  She was discharged on full replacement doses of hydrocortisone although not clear if her husband is giving her the 30 mg in split doses Recently she feels fairly good with no increased weakness, decreased appetite or nausea Weight is about the same Labs pending    Lab Results  Component Value Date   CREATININE 0.74 12/03/2015   BUN 11 12/03/2015   NA 143 12/03/2015   K 3.5 12/03/2015   CL 104 12/03/2015   CO2 30 12/03/2015    Growth hormone levels: IGF-I is low normal Previously at 51 and repeat level pending   SECONDARY HYPOTHYROIDISM:  On her  initial consultation 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  With starting levothyroxine she  had felt significantly more energetic, alert and able to walk better  She is currently on 100 g Again she feels fairly good with normal energy level, no significant cold intolerance  Her free T4 level is in normal range in November and improved from prior level   Wt Readings from Last 3 Encounters:  01/08/16 236 lb 9.6 oz (107.3 kg)  12/03/15 236 lb 3.2 oz (107.1 kg)  11/01/15 237 lb (107.5 kg)    Labs:  Lab Results  Component Value Date   TSH 0.023 (L) 11/27/2015   TSH 0.34 (L) 05/28/2015   TSH 1.396 11/05/2014   FREET4 1.12 11/28/2015   FREET4 0.76 10/29/2015   FREET4 1.07 07/31/2015       Past Medical History:  Diagnosis Date  . Addison's disease ([place])   . Anemia 03/06/2013  . Bladder spasm   . Brain cancer ([place])    Frontal lobe, 1993 and 2005  . Fibromyalgia   . GERD (gastroesophageal reflux disease)   . Hyperchloremia   . Hypertension   . 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 Migraines   . Morton neuroma   . Right fibular fracture 07/12/2011  . 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 Seizures ([place])   .  Stroke [facility])     Past Surgical History:  Procedure Laterality Date  . APPENDECTOMY  1976  . brain cancer     1993 and 2005  . CHOLECYSTECTOMY  08/02/2013   Procedure: LAPAROSCOPIC CHOLECYSTECTOMY;  Surgeon: Gwenyth Ober, MD;  Location: [facility] OR;  Service: General;;  . ESOPHAGOGASTRODUODENOSCOPY N/A 12/28/2013   Procedure: ESOPHAGOGASTRODUODENOSCOPY (EGD);  Surgeon: Inda Castle, MD;  Location: [place];  Service: Endoscopy;  Laterality: N/A;  . EXCISION MORTON'S NEUROMA     right foot  . ORIF ANKLE FRACTURE  07/12/2011   Procedure: OPEN REDUCTION INTERNAL FIXATION (ORIF) ANKLE FRACTURE;   Surgeon: Johnny Bridge, MD;  Location: [facility] [facility];  Service: Orthopedics;  Laterality: Right;  . SAVORY DILATION N/A 12/28/2013   Procedure: SAVORY DILATION;  Surgeon: Inda Castle, MD;  Location: [place];  Service: Endoscopy;  Laterality: N/A;    Family History  Problem Relation Age of Onset  . Diabetes Mother   . Hypertension Mother   . Hypertension Father   . Diabetes Brother     Social History:  reports that she has never smoked. She has never used smokeless tobacco. She reports that she does not drink alcohol or use drugs.  Allergies:  Allergies  Allergen Reactions  . Versed [Midazolam] Other (See Comments) and Anaphylaxis    Severe respiratory depression, had to be masked in preop holding.  . Ciprofloxacin Nausea And Vomiting  . Cyclobenzaprine Itching    Other reaction(s): Itching / Pruritis (ALLERGY/intolerance)  . Methocarbamol Itching    REACTION: itching   . Metronidazole Other (See Comments)    Unknown   . Penicillins Rash    Allergies as of 01/08/2016      Reactions   Versed [midazolam] Other (See Comments), Anaphylaxis   Severe respiratory depression, had to be masked in preop holding.   Ciprofloxacin Nausea And Vomiting   Cyclobenzaprine Itching   Other reaction(s): Itching / Pruritis (ALLERGY/intolerance)   Methocarbamol Itching   REACTION: itching   Metronidazole Other (See Comments)   Unknown    Penicillins Rash      Medication List       Accurate as of 01/08/16  2:33 PM. Always use your most recent med list.          apixaban 5 MG Tabs tablet Commonly known as:  ELIQUIS Take 2 tablets (10 mg total) by mouth 2 (two) times daily. Through 12/07/15 10 AM dose. Then 1 tablet (5 mg ) twice daily starting 2200 on 12/07/15.   aspirin 81 MG EC tablet Take 1 tablet (81 mg total) by mouth daily.   aspirin-acetaminophen-caffeine [id number] MG tablet Commonly known as:  EXCEDRIN MIGRAINE Take by mouth every 6 (six) hours as needed for headache.     CITRACAL + D PO Take 1 tablet by mouth daily.   DULoxetine 60 MG capsule Commonly known as:  CYMBALTA Take 1 capsule (60 mg total) by mouth daily.   fenofibrate 160 MG tablet Take 1 tablet (160 mg total) by mouth daily.   hydrocortisone 10 MG tablet Commonly known as:  CORTEF Take 3 tablets (30 mg total) by mouth daily.   hydrOXYzine 25 MG tablet Commonly known as:  ATARAX/VISTARIL Take 1 tablet (25 mg total) by mouth 3 (three) times daily as needed for anxiety.   KEPPRA 500 MG tablet Generic drug:  levETIRAcetam Take 3 tablets (1,500 mg total) by mouth 2 (two) times daily.   levothyroxine 100 MCG tablet Commonly known as:  SYNTHROID, LEVOTHROID  Take 1 tablet (100 mcg total) by mouth daily.   methocarbamol 500 MG tablet Commonly known as:  ROBAXIN Take 1 tablet (500 mg total) by mouth 3 (three) times daily as needed for muscle spasms.   metoprolol tartrate 25 MG tablet Commonly known as:  LOPRESSOR Take 0.5 tablets (12.5 mg total) by mouth 2 (two) times daily.   omeprazole 20 MG capsule Commonly known as:  PRILOSEC TAKE 1 CAPSULE BY MOUTH DAILY   pregabalin 150 MG capsule Commonly known as:  LYRICA Take 1 capsule (150 mg total) by mouth 2 (two) times daily.   promethazine 25 MG tablet Commonly known as:  PHENERGAN Take 1 tablet (25 mg total) by mouth every 6 (six) hours as needed for nausea or vomiting.   rosuvastatin 10 MG tablet Commonly known as:  CRESTOR TAKE 1 TABLET BY MOUTH  EVERY MONDAY, WEDNESDAY,  FRIDAY   traZODone 50 MG tablet Commonly known as:  DESYREL Take 0.5-1 tablets (25-50 mg total) by mouth at bedtime as needed for sleep.       LABS:  No visits with results within 1 Week(s) from this visit.  Latest known visit with results is:  Admission on 11/27/2015, Discharged on 12/03/2015  No results displayed because visit has over 200 results.       REVIEW OF SYSTEMS:        Weight gain: Husband thinks that she has had chronic obesity    PHYSICAL EXAM:  BP (!) 133/101   Pulse (!) 101   Ht 5' 2.99" (1.6 m)   Wt 236 lb 9.6 oz (107.3 kg)   SpO2 91%   BMI 41.92 kg/m   Repeat blood pressure 122/100 with automatic off on the right side Her pulse is better felt on the left side  Her face looks full but not cushingoid She has no supraclavicular fat pads present and mild buffalo hump. She is again in a wheelchair today  ASSESSMENT:   HYPOPITUITARISM with adrenal, thyroid and gonadotropin  deficiencies  Secondary adrenal insufficiency:. She is subjectively doing  well with a total of 30 mg hydrocortisone since her hospitalization about 6 weeks ago Her weight gain has leveled off Has no  weakness or lightheadedness  Electrolytes are pending  SECONDARY HYPOTHYROIDISM  She has been fairly alert, feels fairly good with energy level more recently after recovering from her severe illness in November  Her free T4 is improved on her assessment in November and will need follow-up level also  Growth hormone levels: IGF-I has been low normal at 51 but needs follow-up  HYPERTENSION: Patient is not on any medications and will be seeing her PCP soon for management  PLAN:   She will continue her hydrocortisone, 20 mg in the morning and 10 mg evening for now Labs to be checked today   Follow-up in 3 months again    [facility] 01/08/2016, 2:33 PM   Addendum: Electrolytes and free T4 normal Since free T4 is upper normal will reduce her dose back down to 88 g levothyroxine

## 2016-01-08 NOTE — Patient Instructions (Signed)
Hydrocortisone 20 mg in am and 10mg at 5 pm   

## 2016-01-09 LAB — INSULIN-LIKE GROWTH FACTOR: Insulin-Like GF-1: 42 ng/mL — ABNORMAL LOW (ref 46–172)

## 2016-01-10 ENCOUNTER — Other Ambulatory Visit: Payer: Self-pay

## 2016-01-10 ENCOUNTER — Encounter: Payer: Self-pay | Admitting: Family Medicine

## 2016-01-10 MED ORDER — METHOCARBAMOL 500 MG PO TABS: 500.0000 mg | ORAL_TABLET | Freq: Three times a day (TID) | ORAL | 0 refills | Status: DC | PRN

## 2016-01-10 MED ORDER — LEVOTHYROXINE SODIUM 88 MCG PO TABS: 88.0000 ug | ORAL_TABLET | Freq: Every day | ORAL | 3 refills | Status: DC

## 2016-01-10 MED ORDER — PROMETHAZINE HCL 25 MG PO TABS: 25.0000 mg | ORAL_TABLET | Freq: Four times a day (QID) | ORAL | 0 refills | Status: DC | PRN

## 2016-01-18 ENCOUNTER — Other Ambulatory Visit: Payer: Self-pay | Admitting: General Practice

## 2016-01-18 ENCOUNTER — Encounter: Payer: Self-pay | Admitting: Family Medicine

## 2016-01-18 ENCOUNTER — Ambulatory Visit (INDEPENDENT_AMBULATORY_CARE_PROVIDER_SITE_OTHER): Payer: 59 | Admitting: Family Medicine

## 2016-01-18 VITALS — BP 123/88 | HR 89 | Temp 98.2°F | Resp 17 | Ht 63.0 in | Wt 234.4 lb

## 2016-01-18 DIAGNOSIS — E785 Hyperlipidemia, unspecified: Secondary | ICD-10-CM | POA: Diagnosis not present

## 2016-01-18 DIAGNOSIS — I2699 Other pulmonary embolism without acute cor pulmonale: Secondary | ICD-10-CM

## 2016-01-18 DIAGNOSIS — B372 Candidiasis of skin and nail: Secondary | ICD-10-CM

## 2016-01-18 DIAGNOSIS — I10 Essential (primary) hypertension: Secondary | ICD-10-CM

## 2016-01-18 MED ORDER — PROMETHAZINE HCL 25 MG PO TABS: 25.0000 mg | ORAL_TABLET | Freq: Four times a day (QID) | ORAL | 6 refills | Status: DC | PRN

## 2016-01-18 MED ORDER — NYSTATIN 100000 UNIT/GM EX POWD: Freq: Four times a day (QID) | CUTANEOUS | 6 refills | Status: DC

## 2016-01-18 NOTE — Assessment & Plan Note (Signed)
Chronic problem.  Adequate control.  Asymptomatic at this time.  Check labs.  No anticipated med changes. 

## 2016-01-18 NOTE — Progress Notes (Signed)
   Subjective:    Patient ID: [name], female    DOB: Aug 23, 1955, 61 y.o.   MRN: [id number]  HPI Hyperlipidemia- chronic problem, on Fenofibrate and Crestor daily.  Denies abd pain, N/V.  PE- pt was hospitalized 11/21-27 for sepsis and PE.  D/c'd on Eliquis.  No SOB, CP.  Eliquis was d/c'd after 30 days of tx and she was switched to ASA 81mg  daily.    HTN- chronic problem, on Metoprolol w/ adequate BP control today.  No CP, SOB, HAs, visual changes, edema.  'yeast infection'- used OTC Monistat 3 internally but she has external yeast dermatitis.   Review of Systems For ROS see HPI     Objective:   Physical Exam  Constitutional: She is oriented to person, place, and time. She appears well-developed and well-nourished. No distress.  Obese, sitting in wheelchair  HENT:  Head: Normocephalic and atraumatic.  Eyes: Conjunctivae and EOM are normal. Pupils are equal, round, and reactive to light.  Neck: Normal range of motion. Neck supple. No thyromegaly present.  Cardiovascular: Normal rate, regular rhythm, normal heart sounds and intact distal pulses.   No murmur heard. Pulmonary/Chest: Effort normal and breath sounds normal. No respiratory distress.  Abdominal: Soft. She exhibits no distension. There is no tenderness.  Genitourinary:  Genitourinary Comments: Pt declined  Musculoskeletal: She exhibits no edema.  Lymphadenopathy:    She has no cervical adenopathy.  Neurological: She is alert and oriented to person, place, and time.  Skin: Skin is warm and dry.  Psychiatric: She has a normal mood and affect. Her behavior is normal.  Vitals reviewed.         Assessment & Plan:

## 2016-01-18 NOTE — Assessment & Plan Note (Signed)
Chronic problem.  Tolerating statin w/o difficulty.  Stressed need for healthy diet.  Check labs.  Adjust meds prn  

## 2016-01-18 NOTE — Patient Instructions (Signed)
Schedule your complete physical in 6 months We'll notify you of your lab results and make any changes if needed Continue to work on healthy diet and exercise as able- you can do it! Use the Nystatin powder up to 4x/day in the groin creases to help w/ yeast Call with any questions or concerns Happy New Year!!!

## 2016-01-18 NOTE — Assessment & Plan Note (Signed)
Pt reports this is external, in her groin creases.  Start Nystatin powder.  Reviewed supportive care and red flags that should prompt return.  Pt expressed understanding and is in agreement w/ plan.

## 2016-01-18 NOTE — Progress Notes (Signed)
Pre visit review using our clinic review tool, if applicable. No additional management support is needed unless otherwise documented below in the visit note. 

## 2016-01-18 NOTE — Assessment & Plan Note (Signed)
New to provider.  Pt was tx'd w/ 30 days of Eliquis and this was then switched 81mg  ASA.  Unclear why her anticoagulation was stopped but pt and husband are more comfortable on ASA due to hx of seizures.  Will follow.

## 2016-01-19 LAB — LIPID PANEL
CHOLESTEROL TOTAL: 148 mg/dL (ref 100–199)
Chol/HDL Ratio: 2.2 ratio units (ref 0.0–4.4)
HDL: 68 mg/dL (ref >39)
LDL Calculated: 62 mg/dL (ref 0–99)
Triglycerides: 88 mg/dL (ref 0–149)
VLDL Cholesterol Cal: 18 mg/dL (ref 5–40)

## 2016-01-19 LAB — CBC WITH DIFFERENTIAL/PLATELET
BASOS: 1 %
Basophils Absolute: 0 10*3/uL (ref 0.0–0.2)
EOS (ABSOLUTE): 0.1 10*3/uL (ref 0.0–0.4)
EOS: 1 %
HEMATOCRIT: 34.3 % (ref 34.0–46.6)
Hemoglobin: 10 g/dL — ABNORMAL LOW (ref 11.1–15.9)
Immature Grans (Abs): 0 10*3/uL (ref 0.0–0.1)
Immature Granulocytes: 0 %
LYMPHS ABS: 1.2 10*3/uL (ref 0.7–3.1)
Lymphs: 17 %
MCH: 22.8 pg — ABNORMAL LOW (ref 26.6–33.0)
MCHC: 29.2 g/dL — AB (ref 31.5–35.7)
MCV: 78 fL — AB (ref 79–97)
MONOS ABS: 0.5 10*3/uL (ref 0.1–0.9)
Monocytes: 7 %
Neutrophils Absolute: 5.1 10*3/uL (ref 1.4–7.0)
Neutrophils: 74 %
Platelets: 416 10*3/uL — ABNORMAL HIGH (ref 150–379)
RBC: 4.38 x10E6/uL (ref 3.77–5.28)
RDW: 17.2 % — AB (ref 12.3–15.4)
WBC: 6.9 10*3/uL (ref 3.4–10.8)

## 2016-01-19 LAB — HEPATIC FUNCTION PANEL
ALBUMIN: 4.1 g/dL (ref 3.5–5.5)
ALK PHOS: 64 IU/L (ref 39–117)
ALT: 19 IU/L (ref 0–32)
AST: 19 IU/L (ref 0–40)
BILIRUBIN, DIRECT: 0.11 mg/dL (ref 0.00–0.40)
Bilirubin Total: 0.2 mg/dL (ref 0.0–1.2)
TOTAL PROTEIN: 6.8 g/dL (ref 6.0–8.5)

## 2016-01-22 ENCOUNTER — Telehealth: Payer: Self-pay | Admitting: Family Medicine

## 2016-01-22 NOTE — Telephone Encounter (Signed)
Caller with [facility] asking for a copy of pt lipid panel to be faxed to her at [phone] to her attn.

## 2016-01-23 ENCOUNTER — Encounter: Payer: Self-pay | Admitting: Family Medicine

## 2016-01-25 ENCOUNTER — Encounter: Payer: Self-pay | Admitting: General Practice

## 2016-01-25 MED ORDER — FLUCONAZOLE 150 MG PO TABS: 150.0000 mg | ORAL_TABLET | Freq: Once | ORAL | 0 refills | Status: AC

## 2016-01-25 NOTE — Telephone Encounter (Signed)
Cannot fax any information until pt gives the ok. Waiting on the response from [employer].

## 2016-02-04 ENCOUNTER — Other Ambulatory Visit: Payer: Self-pay | Admitting: Family Medicine

## 2016-02-05 ENCOUNTER — Other Ambulatory Visit: Payer: Self-pay | Admitting: Family Medicine

## 2016-02-05 NOTE — Telephone Encounter (Signed)
Last OV 01/18/16 Lyrica last filled 10/12/15 #60 with 2

## 2016-02-06 ENCOUNTER — Ambulatory Visit (INDEPENDENT_AMBULATORY_CARE_PROVIDER_SITE_OTHER): Payer: 59 | Admitting: Family Medicine

## 2016-02-06 ENCOUNTER — Encounter: Payer: Self-pay | Admitting: Family Medicine

## 2016-02-06 VITALS — BP 121/82 | HR 97 | Temp 97.9°F | Resp 16 | Ht 63.0 in | Wt 235.1 lb

## 2016-02-06 DIAGNOSIS — R319 Hematuria, unspecified: Secondary | ICD-10-CM | POA: Diagnosis not present

## 2016-02-06 DIAGNOSIS — R300 Dysuria: Secondary | ICD-10-CM | POA: Diagnosis not present

## 2016-02-06 LAB — POCT URINALYSIS DIPSTICK
Bilirubin, UA: NEGATIVE
Glucose, UA: NEGATIVE
Ketones, UA: NEGATIVE
Leukocytes, UA: NEGATIVE
PH UA: 5
UROBILINOGEN UA: 0.2

## 2016-02-06 MED ORDER — PREGABALIN 150 MG PO CAPS: 150.0000 mg | ORAL_CAPSULE | Freq: Two times a day (BID) | ORAL | 3 refills | Status: DC

## 2016-02-06 MED ORDER — NITROFURANTOIN MONOHYD MACRO 100 MG PO CAPS: 100.0000 mg | ORAL_CAPSULE | Freq: Two times a day (BID) | ORAL | 0 refills | Status: DC

## 2016-02-06 NOTE — Progress Notes (Signed)
   Subjective:    Patient ID: [name], female    DOB: 10/01/1955, 61 y.o.   MRN: [id number]  HPI UTI- pt reports burning w/ urination x10 days.  Denies frequency, some urgency.  No blood in urine.  There is an abnormal odor to urine.  No fevers.   Review of Systems For ROS see HPI     Objective:   Physical Exam  Constitutional: She is oriented to person, place, and time. She appears well-developed and well-nourished. No distress.  Abdominal: Soft. She exhibits no distension. There is no tenderness (no suprapubic or CVA tenderness).  Neurological: She is alert and oriented to person, place, and time.  Skin: Skin is warm and dry.  Vitals reviewed.         Assessment & Plan:

## 2016-02-06 NOTE — Progress Notes (Signed)
Pre visit review using our clinic review tool, if applicable. No additional management support is needed unless otherwise documented below in the visit note. 

## 2016-02-06 NOTE — Assessment & Plan Note (Signed)
Pt's sxs and UA consistent w/ infxn.  Start Macrobid.  Reviewed supportive care and red flags that should prompt return.  Pt expressed understanding and is in agreement w/ plan.

## 2016-02-06 NOTE — Patient Instructions (Signed)
Follow up as needed/scheduled We'll notify you of your urine culture result and make any changes if needed Take the Macrobid twice daily as directed Drink plenty of fluids Call with any questions or concerns Hang in there!!!

## 2016-02-08 ENCOUNTER — Other Ambulatory Visit: Payer: Self-pay | Admitting: General Practice

## 2016-02-08 LAB — URINE CULTURE

## 2016-02-08 MED ORDER — PROMETHAZINE HCL 25 MG PO TABS: 25.0000 mg | ORAL_TABLET | Freq: Four times a day (QID) | ORAL | 1 refills | Status: DC | PRN

## 2016-02-08 MED ORDER — TRAZODONE HCL 50 MG PO TABS: 50.0000 mg | ORAL_TABLET | Freq: Every day | ORAL | 1 refills | Status: DC

## 2016-02-08 MED ORDER — PREGABALIN 150 MG PO CAPS: 150.0000 mg | ORAL_CAPSULE | Freq: Two times a day (BID) | ORAL | 1 refills | Status: DC

## 2016-02-08 MED ORDER — FENOFIBRATE 160 MG PO TABS: ORAL_TABLET | ORAL | 1 refills | Status: DC

## 2016-02-08 MED ORDER — DULOXETINE HCL 60 MG PO CPEP: 60.0000 mg | ORAL_CAPSULE | Freq: Every day | ORAL | 1 refills | Status: DC

## 2016-02-08 MED ORDER — OMEPRAZOLE 20 MG PO CPDR: 20.0000 mg | DELAYED_RELEASE_CAPSULE | Freq: Every day | ORAL | 1 refills | Status: DC

## 2016-02-08 NOTE — Telephone Encounter (Signed)
Pt is requesting meds to be filled to mail order  Last OV 02/06/16 keppra does not show that you have ever filled, please advise?  lyrica last filled 02/06/16 #60 with 3 (local pharmacy) Omeprazole last filled 09/21/15 #30 with 6 (local pharmacy) Promethazine last filled 01/18/16 #30 with 6 (local pharmacy) Fenofibrate last filled 02/04/16 #30 with 6 (local pharmacy) Duloxetine last filled 06/18/15 #30 with 6 (local pharmacy) Trazodone last filled 09/14/15 #30 with 3 (local pharmacy)

## 2016-02-13 ENCOUNTER — Encounter: Payer: Self-pay | Admitting: Family Medicine

## 2016-02-13 MED ORDER — LEVETIRACETAM 500 MG PO TABS: 1500.0000 mg | ORAL_TABLET | Freq: Two times a day (BID) | ORAL | 1 refills | Status: DC

## 2016-02-25 ENCOUNTER — Encounter: Payer: Self-pay | Admitting: Family Medicine

## 2016-03-03 ENCOUNTER — Encounter: Payer: 59 | Admitting: Family Medicine

## 2016-03-03 ENCOUNTER — Other Ambulatory Visit: Payer: Self-pay | Admitting: Family Medicine

## 2016-03-03 ENCOUNTER — Encounter: Payer: Self-pay | Admitting: Family Medicine

## 2016-03-03 ENCOUNTER — Ambulatory Visit (INDEPENDENT_AMBULATORY_CARE_PROVIDER_SITE_OTHER): Payer: 59 | Admitting: Family Medicine

## 2016-03-03 VITALS — BP 124/78 | HR 100 | Temp 98.7°F | Resp 17 | Ht 63.0 in | Wt 236.4 lb

## 2016-03-03 DIAGNOSIS — Z0000 Encounter for general adult medical examination without abnormal findings: Secondary | ICD-10-CM | POA: Diagnosis not present

## 2016-03-03 DIAGNOSIS — Z23 Encounter for immunization: Secondary | ICD-10-CM | POA: Diagnosis not present

## 2016-03-03 LAB — HEPATIC FUNCTION PANEL
ALK PHOS: 50 U/L (ref 33–130)
ALT: 18 U/L (ref 6–29)
AST: 20 U/L (ref 10–35)
Albumin: 3.8 g/dL (ref 3.6–5.1)
BILIRUBIN TOTAL: 0.3 mg/dL (ref 0.2–1.2)
Bilirubin, Direct: 0.1 mg/dL (ref <=0.2)
Indirect Bilirubin: 0.2 mg/dL (ref 0.2–1.2)
Total Protein: 6.7 g/dL (ref 6.1–8.1)

## 2016-03-03 LAB — CBC WITH DIFFERENTIAL/PLATELET
BASOS ABS: 0 {cells}/uL (ref 0–200)
BASOS PCT: 0 %
EOS PCT: 1 %
Eosinophils Absolute: 75 cells/uL (ref 15–500)
HCT: 35.8 % (ref 35.0–45.0)
HEMOGLOBIN: 10.4 g/dL — AB (ref 11.7–15.5)
LYMPHS ABS: 975 {cells}/uL (ref 850–3900)
Lymphocytes Relative: 13 %
MCH: 21.8 pg — ABNORMAL LOW (ref 27.0–33.0)
MCHC: 29.1 g/dL — ABNORMAL LOW (ref 32.0–36.0)
MCV: 74.9 fL — ABNORMAL LOW (ref 80.0–100.0)
MPV: 10.2 fL (ref 7.5–12.5)
Monocytes Absolute: 525 cells/uL (ref 200–950)
Monocytes Relative: 7 %
NEUTROS ABS: 5925 {cells}/uL (ref 1500–7800)
Neutrophils Relative %: 79 %
Platelets: 445 10*3/uL — ABNORMAL HIGH (ref 140–400)
RBC: 4.78 MIL/uL (ref 3.80–5.10)
RDW: 17.2 % — ABNORMAL HIGH (ref 11.0–15.0)
WBC: 7.5 10*3/uL (ref 3.8–10.8)

## 2016-03-03 LAB — BASIC METABOLIC PANEL WITH GFR
BUN: 12 mg/dL (ref 7–25)
CO2: 29 mmol/L (ref 20–31)
Calcium: 9.3 mg/dL (ref 8.6–10.4)
Chloride: 109 mmol/L (ref 98–110)
Creat: 0.76 mg/dL (ref 0.50–1.05)
Glucose, Bld: 121 mg/dL — ABNORMAL HIGH (ref 65–99)
Potassium: 4.7 mmol/L (ref 3.5–5.3)
Sodium: 147 mmol/L — ABNORMAL HIGH (ref 135–146)

## 2016-03-03 LAB — LIPID PANEL
Cholesterol: 150 mg/dL
HDL: 46 mg/dL — ABNORMAL LOW
LDL Cholesterol: 70 mg/dL
Total CHOL/HDL Ratio: 3.3 ratio
Triglycerides: 170 mg/dL — ABNORMAL HIGH
VLDL: 34 mg/dL — ABNORMAL HIGH

## 2016-03-03 LAB — TSH: TSH: 0.02 m[IU]/L — AB

## 2016-03-03 NOTE — Progress Notes (Signed)
Pre visit review using our clinic review tool, if applicable. No additional management support is needed unless otherwise documented below in the visit note. 

## 2016-03-03 NOTE — Assessment & Plan Note (Signed)
Pt's PE WNL w/ exception of obesity and inability to ambulate (not new).  UTD on GYN, colonoscopy.  Tdap updated today.  Check labs.  Anticipatory guidance provided.

## 2016-03-03 NOTE — Patient Instructions (Signed)
Follow up in 6 months to recheck cholesterol We'll notify you of your lab results and make any changes if needed Try and make healthy food choices and exercise as you are able Call with any questions or concerns [place]!!

## 2016-03-03 NOTE — Progress Notes (Signed)
   Subjective:    Patient ID: [name], female    DOB: 01-08-1955, 61 y.o.   MRN: [id number]  HPI CPE- UTD on pap, mammo (GYN), colonoscopy.     Review of Systems Patient reports no vision/ hearing changes, adenopathy,fever, weight change,  persistant/recurrent hoarseness , swallowing issues, chest pain, palpitations, edema, persistant/recurrent cough, hemoptysis, dyspnea (rest/exertional/paroxysmal nocturnal), gastrointestinal bleeding (melena, rectal bleeding), abdominal pain, significant heartburn, bowel changes, GU symptoms (dysuria, hematuria, incontinence), Gyn symptoms (abnormal  bleeding, pain),  syncope, focal weakness, memory loss, skin/hair/nail changes, abnormal bruising or bleeding, or depression.   + numbness of R thumb, first, and middle finger.  Started last week.  Pt is R hand dominant + intermittent anxiety    Objective:   Physical Exam General Appearance:    Alert, cooperative, no distress, appears stated older than stated age, obese  Head:    Normocephalic, without obvious abnormality, atraumatic  Eyes:    PERRL, conjunctiva/corneas clear, EOM's intact, fundi    benign, both eyes  Ears:    Normal TM's and external ear canals, both ears  Nose:   Nares normal, septum midline, mucosa normal, no drainage    or sinus tenderness  Throat:   Lips, mucosa, and tongue normal; teeth and gums normal  Neck:   Supple, symmetrical, trachea midline, no adenopathy;    Thyroid: no enlargement/tenderness/nodules  Back:     Symmetric, no curvature, ROM normal, no CVA tenderness  Lungs:     Clear to auscultation bilaterally, respirations unlabored  Chest Wall:    No tenderness or deformity   Heart:    Regular rate and rhythm, S1 and S2 normal, no murmur, rub   or gallop  Breast Exam:    Deferred to GYN  Abdomen:     Soft, non-tender, bowel sounds active all four quadrants,    no masses, no organomegaly  Genitalia:    Deferred to GYN  Rectal:    Extremities:   Extremities normal,  atraumatic, no cyanosis or edema  Pulses:   2+ and symmetric all extremities  Skin:   Skin color, texture, turgor normal, no rashes or lesions  Lymph nodes:   Cervical, supraclavicular, and axillary nodes normal  Neurologic:   CNII-XII intact, normal strength, sensation and reflexes    throughout          Assessment & Plan:

## 2016-03-04 ENCOUNTER — Other Ambulatory Visit: Payer: Self-pay | Admitting: General Practice

## 2016-03-04 LAB — VITAMIN D 25 HYDROXY (VIT D DEFICIENCY, FRACTURES): VIT D 25 HYDROXY: 24 ng/mL — AB (ref 30–100)

## 2016-03-04 MED ORDER — VITAMIN D (ERGOCALCIFEROL) 1.25 MG (50000 UNIT) PO CAPS: 50000.0000 [IU] | ORAL_CAPSULE | ORAL | 0 refills | Status: DC

## 2016-03-05 LAB — HEMOGLOBIN A1C
HEMOGLOBIN A1C: 5.8 % — AB (ref <5.7)
MEAN PLASMA GLUCOSE: 120 mg/dL

## 2016-03-07 ENCOUNTER — Encounter: Payer: Self-pay | Admitting: Family Medicine

## 2016-04-02 ENCOUNTER — Other Ambulatory Visit: Payer: 59

## 2016-04-07 ENCOUNTER — Ambulatory Visit: Payer: 59 | Admitting: Endocrinology

## 2016-04-21 ENCOUNTER — Other Ambulatory Visit: Payer: Self-pay | Admitting: Family Medicine

## 2016-04-21 DIAGNOSIS — Z1231 Encounter for screening mammogram for malignant neoplasm of breast: Secondary | ICD-10-CM

## 2016-04-24 ENCOUNTER — Encounter: Payer: Self-pay | Admitting: Endocrinology

## 2016-04-24 ENCOUNTER — Ambulatory Visit (INDEPENDENT_AMBULATORY_CARE_PROVIDER_SITE_OTHER): Payer: 59 | Admitting: Endocrinology

## 2016-04-24 VITALS — BP 112/78 | HR 72 | Ht 63.0 in | Wt 234.0 lb

## 2016-04-24 DIAGNOSIS — E230 Hypopituitarism: Secondary | ICD-10-CM | POA: Diagnosis not present

## 2016-04-24 DIAGNOSIS — R7301 Impaired fasting glucose: Secondary | ICD-10-CM | POA: Diagnosis not present

## 2016-04-24 DIAGNOSIS — E038 Other specified hypothyroidism: Secondary | ICD-10-CM | POA: Diagnosis not present

## 2016-04-24 DIAGNOSIS — E2749 Other adrenocortical insufficiency: Secondary | ICD-10-CM | POA: Diagnosis not present

## 2016-04-24 LAB — BASIC METABOLIC PANEL
BUN: 16 mg/dL (ref 6–23)
CALCIUM: 9.4 mg/dL (ref 8.4–10.5)
CO2: 30 meq/L (ref 19–32)
CREATININE: 0.74 mg/dL (ref 0.40–1.20)
Chloride: 103 mEq/L (ref 96–112)
GFR: 85.49 mL/min (ref >60.00)
GLUCOSE: 94 mg/dL (ref 70–99)
Potassium: 3.6 mEq/L (ref 3.5–5.1)
SODIUM: 142 meq/L (ref 135–145)

## 2016-04-24 LAB — T4, FREE: Free T4: 1.07 ng/dL (ref 0.60–1.60)

## 2016-04-24 NOTE — Patient Instructions (Addendum)
Hydrocortisone 10mg   2x daily, 2nd at dinner time

## 2016-04-24 NOTE — Progress Notes (Signed)
Patient ID: [name], female   DOB: April 03, 1955, 61 y.o.   MRN: [id number]           Chief complaint:   Follow-up of Adrenal insufficiency and hypopituitarism  History of Present Illness:    The patient was diagnosed to have adrenal insufficiency when she was hospitalized on 11/05/14 with a syncopal episode and hypotension. 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  After her initial consultation she was switched to hydrocortisone with a maintenance dose of 20 mg in the morning and 10 mg in the late afternoon  Recent history: Because of her significant weight gain and mild cushingoid appearance her hydrocortisone was reduced to 10 mg twice a day in 5/17 and then further down to 10 mg once daily since her urine free cortisol was 26 while taking dexamethasone in 11/17 However she had a hospitalization for sepsis and pulmonary embolism in November associated with hypotension  She was discharged on full replacement doses of hydrocortisone  He is not getting 20 mg in the morning but the evening dose of 10 mg is being given at bedtime Recently she feels fairly good with no  weakness, decreased appetite or nausea Weight is about the same  Her sodium was high 2 months ago but she had not eaten or had anything to drink all day before this    Lab Results  Component Value Date   CREATININE 0.76 03/03/2016   BUN 12 03/03/2016   NA 147 (H) 03/03/2016   K 4.7 03/03/2016   CL 109 03/03/2016   CO2 29 03/03/2016    Growth hormone levels: IGF-I is low At 42, Previously at 51 No unusual fatigue   SECONDARY HYPOTHYROIDISM:  On her initial consultation she was  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  With starting levothyroxine she  had felt significantly more energetic, alert and able to walk better  She is currently on only 88 g since her free T4 was higher in January Again she feels fairly good with normal energy level, good memory and mental function and no significant cold intolerance  Her free T4 level is pending   Wt Readings from Last 3 Encounters:  04/24/16 234 lb (106.1 kg)  03/03/16 236 lb 6 oz (107.2 kg)  02/06/16 235 lb 2 oz (106.7 kg)    Labs:  Lab Results  Component Value Date   TSH 0.02 (L) 03/03/2016   TSH 0.09 (L) 01/08/2016   TSH 0.023 (L) 11/27/2015   FREET4 1.49 01/08/2016   FREET4 1.12 11/28/2015   FREET4 0.76 10/29/2015    PREDIABETES: See review of systems   Past Medical History:  Diagnosis Date  . Addison's disease (HCC)   . Anemia 03/06/2013  . Bladder spasm   . Brain cancer (HCC)    Frontal lobe, 1993 and 2005  . Fibromyalgia   . GERD (gastroesophageal reflux disease)   . Hyperchloremia   . Hypertension   . 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 Migraines   . Gwenevere Abbot  neuroma   . Right fibular fracture 07/12/2011  . Secondary Parkinson disease (HCC)   . Seizures (HCC)   . Stroke [facility])     Past Surgical History:  Procedure Laterality Date  . APPENDECTOMY  1976  . brain cancer     1993 and 2005  . CHOLECYSTECTOMY  08/02/2013   Procedure: LAPAROSCOPIC CHOLECYSTECTOMY;  Surgeon: Cherylynn Ridges, MD;  Location[facility];  Service: General;;  . ESOPHAGOGASTRODUODENOSCOPY N/A 12/28/2013   Procedure: ESOPHAGOGASTRODUODENOSCOPY (EGD);  Surgeon: Louis Meckel, MD;  Location[facility] ENDOSCOPY;  Service: Endoscopy;  Laterality: N/A;  . EXCISION MORTON'S NEUROMA     right foot  . ORIF ANKLE FRACTURE  07/12/2011   Procedure: OPEN REDUCTION INTERNAL  FIXATION (ORIF) ANKLE FRACTURE;  Surgeon: Eulas Post, MD;  Location: [facility] OR;  Service: Orthopedics;  Laterality: Right;  . SAVORY DILATION N/A 12/28/2013   Procedure: SAVORY DILATION;  Surgeon: Louis Meckel, MD;  Location: [facility] ENDOSCOPY;  Service: Endoscopy;  Laterality: N/A;    Family History  Problem Relation Age of Onset  . Diabetes Mother   . Hypertension Mother   . Hypertension Father   . Diabetes Brother     Social History:  reports that she has never smoked. She has never used smokeless tobacco. She reports that she does not drink alcohol or use drugs.  Allergies:  Allergies  Allergen Reactions  . Versed [Midazolam] Other (See Comments) and Anaphylaxis    Severe respiratory depression, had to be masked in preop holding.  . Ciprofloxacin Nausea And Vomiting  . Cyclobenzaprine Itching    Other reaction(s): Itching / Pruritis (ALLERGY/intolerance)  . Methocarbamol Itching    REACTION: itching   . Metronidazole Other (See Comments)    Unknown   . Penicillins Rash    Allergies as of 04/24/2016      Reactions   Versed [midazolam] Other (See Comments), Anaphylaxis   Severe respiratory depression, had to be masked in preop holding.   Ciprofloxacin Nausea And Vomiting   Cyclobenzaprine Itching   Other reaction(s): Itching / Pruritis (ALLERGY/intolerance)   Methocarbamol Itching   REACTION: itching   Metronidazole Other (See Comments)   Unknown    Penicillins Rash      Medication List       Accurate as of 04/24/16  1:09 PM. Always use your most recent med list.          aspirin 81 MG EC tablet Take 1 tablet (81 mg total) by mouth daily.   aspirin-acetaminophen-caffeine 250-250-65 MG tablet Commonly known as:  EXCEDRIN MIGRAINE Take by mouth every 6 (six) hours as needed for headache.   CITRACAL + D PO Take 1 tablet by mouth daily.   clotrimazole-betamethasone cream Commonly known as:  LOTRISONE APPLY 1 APPLICATON ON THE SKIN TWICE A DAY   DULoxetine  60 MG capsule Commonly known as:  CYMBALTA Take 1 capsule (60 mg total) by mouth daily.   fenofibrate 160 MG tablet TAKE ONE (1) TABLET BY MOUTH EVERY DAY   hydrocortisone 20 MG tablet Commonly known as:  CORTEF   levETIRAcetam 500 MG tablet Commonly known as:  KEPPRA Take 3 tablets (1,500 mg total) by mouth 2 (two) times daily.   levothyroxine 88 MCG tablet Commonly known as:  SYNTHROID, LEVOTHROID Take 1 tablet (88 mcg total) by mouth daily.   methocarbamol 500 MG tablet Commonly known as:  ROBAXIN Take 1 tablet (500 mg total) by mouth 3 (three) times daily as needed for muscle spasms.  metoprolol tartrate 25 MG tablet Commonly known as:  LOPRESSOR Take 0.5 tablets (12.5 mg total) by mouth 2 (two) times daily.   nitrofurantoin (macrocrystal-monohydrate) 100 MG capsule Commonly known as:  MACROBID Take 1 capsule (100 mg total) by mouth 2 (two) times daily.   nystatin powder Commonly known as:  MYCOSTATIN/NYSTOP Apply topically 4 (four) times daily.   omeprazole 20 MG capsule Commonly known as:  PRILOSEC Take 1 capsule (20 mg total) by mouth daily.   pregabalin 150 MG capsule Commonly known as:  LYRICA Take 1 capsule (150 mg total) by mouth 2 (two) times daily.   promethazine 25 MG tablet Commonly known as:  PHENERGAN Take 1 tablet (25 mg total) by mouth every 6 (six) hours as needed for nausea or vomiting.   rosuvastatin 10 MG tablet Commonly known as:  CRESTOR TAKE 1 TABLET BY MOUTH  EVERY MONDAY, WEDNESDAY,  FRIDAY   traZODone 50 MG tablet Commonly known as:  DESYREL Take 1 tablet (50 mg total) by mouth at bedtime.   Vitamin D (Ergocalciferol) 50000 units Caps capsule Commonly known as:  DRISDOL Take 1 capsule (50,000 Units total) by mouth every 7 (seven) days.       LABS:  No visits with results within 1 Week(s) from this visit.  Latest known visit with results is:  Orders Only on 03/03/2016  Component Date Value Ref Range Status  . Hgb A1c MFr  Bld 03/03/2016 5.8* <5.7 % Final   Comment:   For someone without known diabetes, a hemoglobin A1c value between 5.7% and 6.4% is consistent with prediabetes and should be confirmed with a follow-up test.   For someone with known diabetes, a value <7% indicates that their diabetes is well controlled. A1c targets should be individualized based on duration of diabetes, age, co-morbid conditions and other considerations.   This assay result is consistent with an increased risk of diabetes.   Currently, no consensus exists regarding use of hemoglobin A1c for diagnosis of diabetes in children.     . Mean Plasma Glucose 03/03/2016 120  mg/dL Final     REVIEW OF SYSTEMS:        PREDIABETES: Her fasting glucose was 126 with PCP although A1c was only 5.8 Blood sugar  was taken late afternoon but she was fasting all day   Lab Results  Component Value Date   HGBA1C 5.8 (H) 03/03/2016   HGBA1C 5.5 11/05/2014   HGBA1C 5.5 12/28/2013   Lab Results  Component Value Date   LDLCALC 70 03/03/2016   CREATININE 0.76 03/03/2016       PHYSICAL EXAM:  BP 112/78   Pulse 72   Ht 5\' 3"  (1.6 m)   Wt 234 lb (106.1 kg)   BMI 41.45 kg/m   Unable to get blood pressure standing up, difficult to auscultate Her pulse is better felt on the left side  Her face looks full  She has mild supraclavicular fat pads present and mild buffalo hump. She is again in a wheelchair today Skin is relatively thin on the forearms  ASSESSMENT:   HYPOPITUITARISM with adrenal, thyroid and gonadotropin  deficiencies  Secondary adrenal insufficiency:. She is subjectively doing  well with a total of 30 mg hydrocortisone in divided doses Her weight gain has leveled off Has no  weakness or lightheadedness  Electrolytes are pending  SECONDARY HYPOTHYROIDISM  She has been fairly alert, feels fairly good with energy level being quite good Her free T4 needs to be rechecked  Growth  hormone levels: IGF-I has  been low normal at 51 but needs follow-up  PREDIABETES: She has a relatively high fasting glucose although A1c is normal She does need to lose weight Her husband thinks that she is eating small portions and not clear why she does not lose weight She does take high-dose Lyrica  Take to check fasting glucose again today   PLAN:   She will continue her hydrocortisone, 20 mg in the morning and 10 mg evening but she would take her evening dose at suppertime instead of bedtime Labs to be checked today   Consider metformin if her glucose is still high May need to consider reducing Lyrica as this potentially may be making it difficult for her to lose weight  Follow-up in 3 months again   Total visit time for reviewing multiple problems, reviewing patient's records, labs, counseling = 25 minutes   Raye Slyter 04/24/2016, 1:09 PM

## 2016-04-25 NOTE — Progress Notes (Signed)
Please let patient know that the lab result is normal and no medication change needed

## 2016-04-28 ENCOUNTER — Other Ambulatory Visit: Payer: Self-pay

## 2016-04-28 ENCOUNTER — Telehealth: Payer: Self-pay | Admitting: Endocrinology

## 2016-04-28 MED ORDER — HYDROCORTISONE 20 MG PO TABS: ORAL_TABLET | ORAL | 3 refills | Status: DC

## 2016-04-28 NOTE — Telephone Encounter (Signed)
Patient need a refill of  hydrocortisone (CORTEF) 20 MG tablet 45 tablet   [facility] - [place]. [phone] (Phone) [phone] (Fax)   Before!  please

## 2016-04-28 NOTE — Telephone Encounter (Signed)
Ordered

## 2016-05-03 ENCOUNTER — Encounter: Payer: Self-pay | Admitting: Endocrinology

## 2016-05-05 ENCOUNTER — Encounter: Payer: Self-pay | Admitting: Family Medicine

## 2016-05-05 ENCOUNTER — Encounter: Payer: Self-pay | Admitting: Endocrinology

## 2016-05-05 ENCOUNTER — Ambulatory Visit (INDEPENDENT_AMBULATORY_CARE_PROVIDER_SITE_OTHER): Payer: 59 | Admitting: Family Medicine

## 2016-05-05 VITALS — BP 122/76 | HR 112 | Temp 98.4°F | Resp 12 | Ht 63.0 in

## 2016-05-05 DIAGNOSIS — R5383 Other fatigue: Secondary | ICD-10-CM | POA: Diagnosis not present

## 2016-05-05 DIAGNOSIS — R300 Dysuria: Secondary | ICD-10-CM | POA: Diagnosis not present

## 2016-05-05 DIAGNOSIS — R399 Unspecified symptoms and signs involving the genitourinary system: Secondary | ICD-10-CM

## 2016-05-05 LAB — BASIC METABOLIC PANEL
BUN: 12 mg/dL (ref 6–23)
CHLORIDE: 100 meq/L (ref 96–112)
CO2: 28 mEq/L (ref 19–32)
Calcium: 9.3 mg/dL (ref 8.4–10.5)
Creatinine, Ser: 0.75 mg/dL (ref 0.40–1.20)
GFR: 84.17 mL/min (ref >60.00)
Glucose, Bld: 166 mg/dL — ABNORMAL HIGH (ref 70–99)
POTASSIUM: 3.7 meq/L (ref 3.5–5.1)
SODIUM: 140 meq/L (ref 135–145)

## 2016-05-05 LAB — HEPATIC FUNCTION PANEL
ALK PHOS: 66 U/L (ref 39–117)
ALT: 19 U/L (ref 0–35)
AST: 22 U/L (ref 0–37)
Albumin: 3.9 g/dL (ref 3.5–5.2)
BILIRUBIN DIRECT: 0.1 mg/dL (ref 0.0–0.3)
BILIRUBIN TOTAL: 0.4 mg/dL (ref 0.2–1.2)
Total Protein: 6.7 g/dL (ref 6.0–8.3)

## 2016-05-05 LAB — B12 AND FOLATE PANEL
Folate: >24 ng/mL (ref >5.9)
Vitamin B-12: 354 pg/mL (ref 211–911)

## 2016-05-05 LAB — CBC WITH DIFFERENTIAL/PLATELET
BASOS PCT: 0.9 % (ref 0.0–3.0)
Basophils Absolute: 0.1 10*3/uL (ref 0.0–0.1)
EOS PCT: 1.5 % (ref 0.0–5.0)
Eosinophils Absolute: 0.1 10*3/uL (ref 0.0–0.7)
HCT: 36.8 % (ref 36.0–46.0)
Hemoglobin: 11.1 g/dL — ABNORMAL LOW (ref 12.0–15.0)
LYMPHS ABS: 0.7 10*3/uL (ref 0.7–4.0)
Lymphocytes Relative: 9.9 % — ABNORMAL LOW (ref 12.0–46.0)
MCHC: 30.1 g/dL (ref 30.0–36.0)
MCV: 73.4 fl — AB (ref 78.0–100.0)
MONO ABS: 0.3 10*3/uL (ref 0.1–1.0)
MONOS PCT: 4.5 % (ref 3.0–12.0)
NEUTROS ABS: 6.1 10*3/uL (ref 1.4–7.7)
NEUTROS PCT: 83.2 % — AB (ref 43.0–77.0)
PLATELETS: 357 10*3/uL (ref 150.0–400.0)
RBC: 5.02 Mil/uL (ref 3.87–5.11)
RDW: 20.7 % — AB (ref 11.5–15.5)
WBC: 7.4 10*3/uL (ref 4.0–10.5)

## 2016-05-05 LAB — TSH: TSH: 0.02 u[IU]/mL — AB (ref 0.35–4.50)

## 2016-05-05 LAB — VITAMIN D 25 HYDROXY (VIT D DEFICIENCY, FRACTURES): VITD: 28.38 ng/mL — AB (ref 30.00–100.00)

## 2016-05-05 NOTE — Progress Notes (Signed)
Pre visit review using our clinic review tool, if applicable. No additional management support is needed unless otherwise documented below in the visit note. 

## 2016-05-05 NOTE — Progress Notes (Signed)
   Subjective:    Patient ID: [name], female    DOB: [birth year], 61 y.o.   MRN: [id number]  HPI 'many things'- started Wednesday, pt developed rapid breathing, SOB, worsening fatigue, abnormal urinary odor, decreased ability to transfer from wheelchair to car/bed/etc.  Just had recent Endo check up and things were 'good'.  At that time, pt was feeling good.  No abdominal pain.  No fever.  Denies urinary frequency.  Intermittent dysuria.   Taking Allegra D for seasonal allergies.  Increased tremor, R>L.   Review of Systems For ROS see HPI     Objective:   Physical Exam  Constitutional: She is oriented to person, place, and time. She appears well-developed and well-nourished. No distress.  Sitting in wheelchair  HENT:  Head: Normocephalic and atraumatic.  Eyes: Conjunctivae and EOM are normal. Pupils are equal, round, and reactive to light.  Neck: Normal range of motion. Neck supple. No thyromegaly present.  Cardiovascular: Normal rate, regular rhythm, normal heart sounds and intact distal pulses.   No murmur heard. Pulmonary/Chest: Effort normal and breath sounds normal. No respiratory distress.  Abdominal: Soft. She exhibits no distension. There is no tenderness.  Musculoskeletal: She exhibits no edema.  Lymphadenopathy:    She has no cervical adenopathy.  Neurological: She is alert and oriented to person, place, and time.  R hand tremor  Skin: Skin is warm and dry.  Psychiatric: She has a normal mood and affect. Her behavior is normal.  Vitals reviewed.         Assessment & Plan:

## 2016-05-05 NOTE — Patient Instructions (Signed)
Follow up as needed/scheduled We'll notify you of your lab results and make any changes if needed Continue to work on building your stamina- this is very important and will help w/ your shortness of breath If no improvement in shortness of breath or fatigue, please let me know so we can move forward with the work up Call with any questions or concerns Hang in there!!!

## 2016-05-06 ENCOUNTER — Other Ambulatory Visit (INDEPENDENT_AMBULATORY_CARE_PROVIDER_SITE_OTHER): Payer: 59

## 2016-05-06 DIAGNOSIS — D649 Anemia, unspecified: Secondary | ICD-10-CM | POA: Diagnosis not present

## 2016-05-06 DIAGNOSIS — R7309 Other abnormal glucose: Secondary | ICD-10-CM | POA: Diagnosis not present

## 2016-05-06 LAB — HEMOGLOBIN A1C: Hgb A1c MFr Bld: 6.5 % (ref 4.6–6.5)

## 2016-05-06 LAB — IBC PANEL
Iron: 31 ug/dL — ABNORMAL LOW (ref 42–145)
Saturation Ratios: 5.6 % — ABNORMAL LOW (ref 20.0–50.0)
Transferrin: 398 mg/dL — ABNORMAL HIGH (ref 212.0–360.0)

## 2016-05-06 NOTE — Assessment & Plan Note (Signed)
Check UA to r/o infxn.  Pt was not able to give urine sample today.  Will hold on abx at this time as sxs are only intermittent and may not be suggestive of UTI.  If sxs continue, pt will need to provide sample.  Pt expressed understanding and is in agreement w/ plan.

## 2016-05-06 NOTE — Assessment & Plan Note (Signed)
Recurrent issue for pt.  She had recent labs done at Endo and all were WNL and pt was feeling good.  Suspect there is a high level of deconditioning.  Check labs to r/o metabolic cause.  Will follow.

## 2016-05-07 ENCOUNTER — Other Ambulatory Visit: Payer: Self-pay | Admitting: General Practice

## 2016-05-07 ENCOUNTER — Other Ambulatory Visit: Payer: Self-pay | Admitting: Endocrinology

## 2016-05-07 DIAGNOSIS — E119 Type 2 diabetes mellitus without complications: Secondary | ICD-10-CM

## 2016-05-07 MED ORDER — FERROUS SULFATE 325 (65 FE) MG PO TABS: 325.0000 mg | ORAL_TABLET | Freq: Every day | ORAL | 6 refills | Status: DC

## 2016-05-08 ENCOUNTER — Ambulatory Visit
Admission: RE | Admit: 2016-05-08 | Discharge: 2016-05-08 | Disposition: A | Discharge status: Home / Self Care | Payer: 59 | Source: Ambulatory Visit | Attending: Family Medicine | Admitting: Family Medicine

## 2016-05-08 DIAGNOSIS — Z1231 Encounter for screening mammogram for malignant neoplasm of breast: Secondary | ICD-10-CM

## 2016-05-27 ENCOUNTER — Encounter: Payer: 59 | Attending: Endocrinology | Admitting: Registered"

## 2016-05-27 ENCOUNTER — Encounter: Payer: Self-pay | Admitting: Registered"

## 2016-05-27 DIAGNOSIS — E119 Type 2 diabetes mellitus without complications: Secondary | ICD-10-CM | POA: Diagnosis present

## 2016-05-27 DIAGNOSIS — Z713 Dietary counseling and surveillance: Secondary | ICD-10-CM | POA: Diagnosis not present

## 2016-05-27 NOTE — Patient Instructions (Addendum)
[place] nuts - selenium (2-3 per day) Omega 3: Fish 2-3 week, ground flaxseed, walnuts Snacks - veggies and hummus Good job on cutting back on the sweetened beverages!  Plan:  Aim for 2-3 Carb Choices per meal (30-45 grams)  Aim for 0-1 Carbs per snack if hungry  Include protein with your meals and snacks Consider reading food labels for Total Carbohydrate and Fat Grams of foods Continue doing your chair exercises and aim to get daily activity daily as tolerated  Goal: Aim to reduce eating out 2-3 week. When cooking at home, experiment with the different seasoning tips.

## 2016-05-27 NOTE — Progress Notes (Signed)
Diabetes Self-Management Education  Visit Type: First/Initial  Appt. Start Time: 1600 Appt. End Time: 9518  05/27/2016  [name], identified by name and date of birth, is a 61 y.o. female with a diagnosis of Diabetes: Type 2.   ASSESSMENT This patient is accompanied in the office by her spouse. Pt is status post stroke and brain tumor. Spouse reports that patient has historically been the one to shop and cook. Spouse reports that most nights he gets their meals from family dining restaurants and fast food. Spouse is supportive and willing to try some simple cooking tips provided by RD     Diabetes Self-Management Education - 05/27/16 1607      Visit Information   Visit Type First/Initial     Initial Visit   Diabetes Type Type 2   Are you currently following a meal plan? No   Are you taking your medications as prescribed? Not on Medications   Date Diagnosed 2 weeks ago     Health Coping   How would you rate your overall health? Good     Psychosocial Assessment   Patient Belief/Attitude about Diabetes Motivated to manage diabetes   How often do you need to have someone help you when you read instructions, pamphlets, or other written materials from your doctor or pharmacy? 1 - Never   What is the last grade level you completed in school? 84ZY     Complications   Last HgB A1C per patient/outside source 6.5 %  3 weeks ago   How often do you check your blood sugar? 0 times/day (not testing)   Have you had a dilated eye exam in the past 12 months? No   Have you had a dental exam in the past 12 months? Yes   Are you checking your feet? No     Dietary Intake   Snack (morning) none   Lunch oatmeal, 2 tsp sugar, butter, whole milk  11 am   Snack (afternoon) none   Dinner eat out OR liver pudding OR egg sandwich OR wings OR angus burger OR chef salad   Snack (evening) none   Beverage(s) diet dr pepper, water 6-8 glasses, unsweet tea when eating out     Exercise   Exercise  Type --  wheelchair   How many days per week to you exercise? 0   How many minutes per day do you exercise? 0   Total minutes per week of exercise 0     Patient Education   Previous Diabetes Education No   Disease state  Definition of diabetes, type 1 and 2, and the diagnosis of diabetes;Factors that contribute to the development of diabetes   Nutrition management  Role of diet in the treatment of diabetes and the relationship between the three main macronutrients and blood glucose level;Food label reading, portion sizes and measuring food.;Carbohydrate counting   Physical activity and exercise  Role of exercise on diabetes management, blood pressure control and cardiac health.   Monitoring Identified appropriate SMBG and/or A1C goals.   Acute complications Taught treatment of hypoglycemia - the 15 rule.   Chronic complications Relationship between chronic complications and blood glucose control;Assessed and discussed foot care and prevention of foot problems;Lipid levels, blood glucose control and heart disease;Identified and discussed with patient  current chronic complications     Individualized Goals (developed by patient)   Nutrition Follow meal plan discussed   Physical Activity Exercise 5-7 days per week     Outcomes  Expected Outcomes Demonstrated interest in learning. Expect positive outcomes   Future DMSE PRN   Program Status Completed      Individualized Plan for Diabetes Self-Management Training:   Learning Objective:  Patient will have a greater understanding of diabetes self-management. Patient education plan is to attend individual and/or group sessions per assessed needs and concerns.  Patient Instructions  [place] nuts - selenium (2-3 per day) Omega 3: Fish 2-3 week, ground flaxseed, walnuts Snacks - veggies and hummus Good job on cutting back on the sweetened beverages!  Plan:  Aim for 2-3 Carb Choices per meal (30-45 grams)  Aim for 0-1 Carbs per snack if  hungry  Include protein with your meals and snacks Consider reading food labels for Total Carbohydrate and Fat Grams of foods Continue doing your chair exercises and aim to get daily activity daily as tolerated  Goal: Aim to reduce eating out 2-3 week. When cooking at home, experiment with the different seasoning tips.  Expected Outcomes:  Demonstrated interest in learning. Expect positive outcomes  Education material provided: Living Well with Diabetes, A1C conversion sheet, My Plate, Carbohydrate counting sheet and No sodium seasonings  If problems or questions, patient to contact team via:  Phone and MyChart  Future DSME appointment: PRN

## 2016-06-05 ENCOUNTER — Other Ambulatory Visit: Payer: Self-pay | Admitting: Endocrinology

## 2016-06-05 ENCOUNTER — Other Ambulatory Visit: Payer: Self-pay | Admitting: Family Medicine

## 2016-06-05 ENCOUNTER — Encounter: Payer: Self-pay | Admitting: Family Medicine

## 2016-06-11 ENCOUNTER — Other Ambulatory Visit: Payer: Self-pay | Admitting: Family Medicine

## 2016-06-11 MED ORDER — METHOCARBAMOL 500 MG PO TABS: 500.0000 mg | ORAL_TABLET | Freq: Three times a day (TID) | ORAL | 1 refills | Status: DC | PRN

## 2016-06-15 ENCOUNTER — Inpatient Hospital Stay (HOSPITAL_COMMUNITY)
Admission: EM | Admit: 2016-06-15 | Discharge: 2016-06-16 | DRG: 064 | Disposition: A | Discharge status: Hospice | Payer: 59 | Attending: Neurology | Admitting: Neurology

## 2016-06-15 ENCOUNTER — Encounter (HOSPITAL_COMMUNITY): Payer: Self-pay | Admitting: Emergency Medicine

## 2016-06-15 ENCOUNTER — Emergency Department (HOSPITAL_COMMUNITY): Payer: 59

## 2016-06-15 DIAGNOSIS — Z85841 Personal history of malignant neoplasm of brain: Secondary | ICD-10-CM | POA: Diagnosis not present

## 2016-06-15 DIAGNOSIS — Z8673 Personal history of transient ischemic attack (TIA), and cerebral infarction without residual deficits: Secondary | ICD-10-CM

## 2016-06-15 DIAGNOSIS — I161 Hypertensive emergency: Secondary | ICD-10-CM | POA: Diagnosis present

## 2016-06-15 DIAGNOSIS — Z6841 Body Mass Index (BMI) 40.0 and over, adult: Secondary | ICD-10-CM | POA: Diagnosis not present

## 2016-06-15 DIAGNOSIS — Z880 Allergy status to penicillin: Secondary | ICD-10-CM

## 2016-06-15 DIAGNOSIS — R4189 Other symptoms and signs involving cognitive functions and awareness: Secondary | ICD-10-CM | POA: Diagnosis present

## 2016-06-15 DIAGNOSIS — E785 Hyperlipidemia, unspecified: Secondary | ICD-10-CM | POA: Diagnosis present

## 2016-06-15 DIAGNOSIS — I619 Nontraumatic intracerebral hemorrhage, unspecified: Secondary | ICD-10-CM | POA: Diagnosis not present

## 2016-06-15 DIAGNOSIS — I615 Nontraumatic intracerebral hemorrhage, intraventricular: Secondary | ICD-10-CM | POA: Diagnosis present

## 2016-06-15 DIAGNOSIS — Z7189 Other specified counseling: Secondary | ICD-10-CM

## 2016-06-15 DIAGNOSIS — Z923 Personal history of irradiation: Secondary | ICD-10-CM | POA: Diagnosis not present

## 2016-06-15 DIAGNOSIS — E039 Hypothyroidism, unspecified: Secondary | ICD-10-CM | POA: Diagnosis present

## 2016-06-15 DIAGNOSIS — G219 Secondary parkinsonism, unspecified: Secondary | ICD-10-CM | POA: Diagnosis present

## 2016-06-15 DIAGNOSIS — K219 Gastro-esophageal reflux disease without esophagitis: Secondary | ICD-10-CM | POA: Diagnosis present

## 2016-06-15 DIAGNOSIS — E038 Other specified hypothyroidism: Secondary | ICD-10-CM | POA: Diagnosis present

## 2016-06-15 DIAGNOSIS — G9340 Encephalopathy, unspecified: Secondary | ICD-10-CM

## 2016-06-15 DIAGNOSIS — M797 Fibromyalgia: Secondary | ICD-10-CM | POA: Diagnosis present

## 2016-06-15 DIAGNOSIS — G40909 Epilepsy, unspecified, not intractable, without status epilepticus: Secondary | ICD-10-CM

## 2016-06-15 DIAGNOSIS — E876 Hypokalemia: Secondary | ICD-10-CM | POA: Diagnosis not present

## 2016-06-15 DIAGNOSIS — Z66 Do not resuscitate: Secondary | ICD-10-CM | POA: Diagnosis present

## 2016-06-15 DIAGNOSIS — E271 Primary adrenocortical insufficiency: Secondary | ICD-10-CM

## 2016-06-15 DIAGNOSIS — I611 Nontraumatic intracerebral hemorrhage in hemisphere, cortical: Principal | ICD-10-CM | POA: Diagnosis present

## 2016-06-15 DIAGNOSIS — Z7982 Long term (current) use of aspirin: Secondary | ICD-10-CM | POA: Diagnosis not present

## 2016-06-15 DIAGNOSIS — Z79899 Other long term (current) drug therapy: Secondary | ICD-10-CM

## 2016-06-15 DIAGNOSIS — G43909 Migraine, unspecified, not intractable, without status migrainosus: Secondary | ICD-10-CM | POA: Diagnosis present

## 2016-06-15 DIAGNOSIS — Z888 Allergy status to other drugs, medicaments and biological substances status: Secondary | ICD-10-CM | POA: Diagnosis not present

## 2016-06-15 DIAGNOSIS — Z515 Encounter for palliative care: Secondary | ICD-10-CM | POA: Diagnosis present

## 2016-06-15 DIAGNOSIS — E2740 Unspecified adrenocortical insufficiency: Secondary | ICD-10-CM | POA: Diagnosis present

## 2016-06-15 LAB — CBG MONITORING, ED
Glucose-Capillary: 158 mg/dL — ABNORMAL HIGH (ref 65–99)
Glucose-Capillary: 142 mg/dL — ABNORMAL HIGH (ref 65–99)

## 2016-06-15 LAB — CBC
HEMATOCRIT: 42.3 % (ref 36.0–46.0)
Hemoglobin: 12.3 g/dL (ref 12.0–15.0)
MCH: 23.8 pg — ABNORMAL LOW (ref 26.0–34.0)
MCHC: 29.1 g/dL — AB (ref 30.0–36.0)
MCV: 82 fL (ref 78.0–100.0)
Platelets: 393 10*3/uL (ref 150–400)
RBC: 5.16 MIL/uL — ABNORMAL HIGH (ref 3.87–5.11)
RDW: 21 % — ABNORMAL HIGH (ref 11.5–15.5)
WBC: 16.5 10*3/uL — ABNORMAL HIGH (ref 4.0–10.5)

## 2016-06-15 LAB — URINALYSIS, ROUTINE W REFLEX MICROSCOPIC
BACTERIA UA: NONE SEEN
Bilirubin Urine: NEGATIVE
Glucose, UA: NEGATIVE mg/dL
Hgb urine dipstick: NEGATIVE
KETONES UR: NEGATIVE mg/dL
LEUKOCYTES UA: NEGATIVE
Nitrite: NEGATIVE
PROTEIN: 100 mg/dL — AB
Specific Gravity, Urine: 1.032 — ABNORMAL HIGH (ref 1.005–1.030)
pH: 5 (ref 5.0–8.0)

## 2016-06-15 LAB — COMPREHENSIVE METABOLIC PANEL
ALBUMIN: 3.8 g/dL (ref 3.5–5.0)
ALT: 24 U/L (ref 14–54)
AST: 32 U/L (ref 15–41)
Alkaline Phosphatase: 64 U/L (ref 38–126)
Anion gap: 10 (ref 5–15)
BUN: 13 mg/dL (ref 6–20)
CO2: 24 mmol/L (ref 22–32)
Calcium: 9.7 mg/dL (ref 8.9–10.3)
Chloride: 108 mmol/L (ref 101–111)
Creatinine, Ser: 0.82 mg/dL (ref 0.44–1.00)
GFR calc Af Amer: >60 mL/min (ref >60)
GFR calc non Af Amer: >60 mL/min (ref >60)
GLUCOSE: 172 mg/dL — AB (ref 65–99)
POTASSIUM: 3.3 mmol/L — AB (ref 3.5–5.1)
Sodium: 142 mmol/L (ref 135–145)
Total Bilirubin: 0.4 mg/dL (ref 0.3–1.2)
Total Protein: 7.1 g/dL (ref 6.5–8.1)

## 2016-06-15 LAB — PROTIME-INR
INR: 1.11
Prothrombin Time: 14.3 seconds (ref 11.4–15.2)

## 2016-06-15 LAB — GLUCOSE, CAPILLARY: Glucose-Capillary: 185 mg/dL — ABNORMAL HIGH (ref 65–99)

## 2016-06-15 LAB — APTT: aPTT: 27 seconds (ref 24–36)

## 2016-06-15 LAB — I-STAT CG4 LACTIC ACID, ED: Lactic Acid, Venous: 3.05 mmol/L (ref 0.5–1.9)

## 2016-06-15 LAB — BRAIN NATRIURETIC PEPTIDE: B NATRIURETIC PEPTIDE 5: 28.2 pg/mL (ref 0.0–100.0)

## 2016-06-15 MED ORDER — SODIUM CHLORIDE 0.9 % IV SOLN: INTRAVENOUS | Status: DC | PRN

## 2016-06-15 MED ORDER — MORPHINE SULFATE (PF) 4 MG/ML IV SOLN
1.0000 mg | INTRAVENOUS | Status: DC | PRN
  Administered 2016-06-16 (×2): 4 mg via INTRAVENOUS
  Filled 2016-06-15 (×2): qty 1

## 2016-06-15 MED ORDER — LIDOCAINE HCL (PF) 1 % IJ SOLN
30.0000 mL | Freq: Once | INTRAMUSCULAR | Status: AC
  Administered 2016-06-15: 30 mL via INTRADERMAL
  Filled 2016-06-15: qty 30

## 2016-06-15 MED ORDER — POTASSIUM CHLORIDE CRYS ER 20 MEQ PO TBCR: 20.0000 meq | EXTENDED_RELEASE_TABLET | Freq: Once | ORAL | Status: DC

## 2016-06-15 MED ORDER — LACTATED RINGERS IV BOLUS (SEPSIS)
1000.0000 mL | Freq: Once | INTRAVENOUS | Status: AC
  Administered 2016-06-15: 1000 mL via INTRAVENOUS

## 2016-06-15 MED ORDER — PANTOPRAZOLE SODIUM 40 MG IV SOLR
40.0000 mg | Freq: Every day | INTRAVENOUS | Status: DC
  Administered 2016-06-15: 40 mg via INTRAVENOUS
  Filled 2016-06-15: qty 40

## 2016-06-15 MED ORDER — SENNOSIDES-DOCUSATE SODIUM 8.6-50 MG PO TABS
1.0000 | ORAL_TABLET | Freq: Two times a day (BID) | ORAL | Status: DC
  Filled 2016-06-15: qty 1

## 2016-06-15 MED ORDER — POTASSIUM CHLORIDE 10 MEQ/100ML IV SOLN
10.0000 meq | INTRAVENOUS | Status: AC
  Administered 2016-06-15 (×2): 10 meq via INTRAVENOUS
  Filled 2016-06-15 (×2): qty 100

## 2016-06-15 MED ORDER — ACETAMINOPHEN 325 MG PO TABS: 650.0000 mg | ORAL_TABLET | ORAL | Status: DC | PRN

## 2016-06-15 MED ORDER — HYDROCORTISONE NA SUCCINATE PF 100 MG IJ SOLR
40.0000 mg | Freq: Every morning | INTRAMUSCULAR | Status: DC
  Administered 2016-06-16: 40 mg via INTRAVENOUS
  Filled 2016-06-15: qty 2

## 2016-06-15 MED ORDER — INSULIN ASPART 100 UNIT/ML ~~LOC~~ SOLN
2.0000 [IU] | SUBCUTANEOUS | Status: DC
  Administered 2016-06-15: 2 [IU] via SUBCUTANEOUS
  Administered 2016-06-16: 4 [IU] via SUBCUTANEOUS
  Administered 2016-06-16: 2 [IU] via SUBCUTANEOUS
  Filled 2016-06-15: qty 1

## 2016-06-15 MED ORDER — STROKE: EARLY STAGES OF RECOVERY BOOK
Freq: Once | Status: DC
  Filled 2016-06-15: qty 1

## 2016-06-15 MED ORDER — NICARDIPINE HCL IN NACL 20-0.86 MG/200ML-% IV SOLN
0.0000 mg/h | INTRAVENOUS | Status: DC
  Administered 2016-06-15: 10 mg/h via INTRAVENOUS
  Administered 2016-06-15: 5 mg/h via INTRAVENOUS
  Administered 2016-06-15 – 2016-06-16 (×2): 12.5 mg/h via INTRAVENOUS
  Administered 2016-06-16 (×5): 15 mg/h via INTRAVENOUS
  Filled 2016-06-15 (×11): qty 200

## 2016-06-15 MED ORDER — ACETAMINOPHEN 650 MG RE SUPP
650.0000 mg | RECTAL | Status: DC | PRN
Start: 2016-06-15 — End: 2016-06-16

## 2016-06-15 MED ORDER — ATROPINE SULFATE 1 MG/10ML IJ SOSY: 0.5000 mg | PREFILLED_SYRINGE | Freq: Once | INTRAMUSCULAR | Status: DC

## 2016-06-15 MED ORDER — ACETAMINOPHEN 160 MG/5ML PO SOLN: 650.0000 mg | ORAL | Status: DC | PRN

## 2016-06-15 MED ORDER — HYDROCORTISONE NA SUCCINATE PF 100 MG IJ SOLR
20.0000 mg | Freq: Every evening | INTRAMUSCULAR | Status: DC
  Administered 2016-06-15: 20 mg via INTRAVENOUS
  Filled 2016-06-15: qty 2

## 2016-06-15 MED ORDER — LEVETIRACETAM 500 MG/5ML IV SOLN
500.0000 mg | Freq: Two times a day (BID) | INTRAVENOUS | Status: DC
  Administered 2016-06-15 – 2016-06-16 (×3): 500 mg via INTRAVENOUS
  Filled 2016-06-15 (×5): qty 5

## 2016-06-15 MED ORDER — IOPAMIDOL (ISOVUE-370) INJECTION 76%
INTRAVENOUS | Status: AC
  Administered 2016-06-15: 100 mL via INTRAVENOUS
  Filled 2016-06-15: qty 100

## 2016-06-15 MED ORDER — LEVOTHYROXINE SODIUM 100 MCG IV SOLR
44.0000 ug | Freq: Every day | INTRAVENOUS | Status: DC
  Administered 2016-06-16: 44 ug via INTRAVENOUS
  Filled 2016-06-15: qty 5

## 2016-06-15 NOTE — ED Notes (Signed)
ED Provider at bedside. 

## 2016-06-15 NOTE — ED Notes (Signed)
Placement of iv at left [facility] confirmed with blood return.

## 2016-06-15 NOTE — H&P (Signed)
Neurology Admission H&P  Requesting provider: Kathrynn Humble  CC: Difficulty getting words out  HPI: This is a 89-yo woman with h/o brain tumor resection x2, brain radiation, epilepsy, and secondary parkinsonism who is now brought to the ED by her husband for evaluation of confusion and decline in function over the past three days. He states that she began having difficulty getting her words out about three days ago and this has steadily worsened since. He reports that she has had intermittent problems with language for years because of her prior surgeries but it usually comes and goes.  When she arrived at the ED, there is a initial concern this may be having a PE or perhaps sepsis. Eventually, CT scan of the head was obtained and showed a large cerebral hemorrhage. Neurology is now asked to admit the patient for further management.  She has a complex neurological history for which she has been followed at [facility]. 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 This was followed by a full course of radiation therapy. According to her husband, she initially didn't have any significant neurologic deficits following surgery and radiation. She was maintained on Keppra with good control of seizures, possibly one break through seizure each year.   Beginning in November 2012, she began having some decline in her motor skills and cognition. Cognitively, she has poor short-term memory and a short attention span. She's been documented to have impairments in executive function and language. She has a flat affect and developed some compulsive behaviors following her brain surgeries. She also developed difficulty walking following a gallbladder surgery in July 2015 requiring use of a wheelchair. She was seen in the movement disorders clinic at  [facility] where she was  evaluated for normal pressure hydrocephalus with a large volume lumbar puncture before ultimately being diagnosed with secondary parkinsonism. She also carries a diagnosis of Addison's disease for which she takes hydrocortisone 20 mg each morning and 10 mg each evening.  PMH:  Past Medical History:  Diagnosis Date  . Addison's disease ([place])   . Anemia 03/06/2013  . Bladder spasm   . Brain cancer ([place])    Frontal lobe, 1993 and 2005  . Fibromyalgia   . GERD (gastroesophageal reflux disease)   . Hyperchloremia   . Hypertension   . Malignant neoplasm of frontal lobe of brain ([place]) 09/18/2008   Qualifier: Diagnosis of  By: Birdie Riddle MD, Belenda Cruise    . Migraines   . Morton neuroma   . Right fibular fracture 07/12/2011  . Secondary Parkinson disease ([place])   . Seizures ([place])   . Stroke [facility])     PSH:  Past Surgical History:  Procedure Laterality Date  . APPENDECTOMY  1976  . brain cancer     1993 and 2005  . CHOLECYSTECTOMY  08/02/2013   Procedure: LAPAROSCOPIC CHOLECYSTECTOMY;  Surgeon: Gwenyth Ober, MD;  Location: [facility] OR;  Service: General;;  . ESOPHAGOGASTRODUODENOSCOPY N/A 12/28/2013   Procedure: ESOPHAGOGASTRODUODENOSCOPY (EGD);  Surgeon: Inda Castle, MD;  Location: [place];  Service: Endoscopy;  Laterality: N/A;  . EXCISION MORTON'S NEUROMA     right foot  . ORIF ANKLE FRACTURE  07/12/2011   Procedure: OPEN REDUCTION INTERNAL FIXATION (ORIF) ANKLE FRACTURE;  Surgeon: Johnny Bridge, MD;  Location: [place];  Service: Orthopedics;  Laterality: Right;  . SAVORY DILATION N/A 12/28/2013  Procedure: SAVORY DILATION;  Surgeon: Inda Castle, MD;  Location: [place];  Service: Endoscopy;  Laterality: N/A;    Family history: Family History  Problem Relation Age of Onset  . Diabetes Mother   . Hypertension Mother   . Hypertension Father   . Diabetes Brother     Social history:  Social History   Social History  . Marital status: Married    Spouse name: N/A  . Number  of children: N/A  . Years of education: N/A   Occupational History  . Not on file.   Social History Main Topics  . Smoking status: Never Smoker  . Smokeless tobacco: Never Used  . Alcohol use No  . Drug use: No  . Sexual activity: Not Currently   Other Topics Concern  . Not on file   Social History Narrative  . No narrative on file    Current outpatient meds: Medications reviewed and reconciled.  Current Meds  Medication Sig  . Ascorbic Acid (VITAMIN C PO) Take 1 tablet by mouth daily.  Marland Kitchen aspirin EC 81 MG EC tablet Take 1 tablet (81 mg total) by mouth daily.  Marland Kitchen aspirin-acetaminophen-caffeine (EXCEDRIN MIGRAINE) 250-250-65 MG per tablet Take 2 tablets by mouth every 6 (six) hours as needed for headache.   . DULoxetine (CYMBALTA) 60 MG capsule TAKE 1 CAPSULE BY MOUTH  DAILY  . fenofibrate 160 MG tablet TAKE 1 TABLET BY MOUTH  EVERY DAY  . ferrous sulfate 325 (65 FE) MG tablet Take 1 tablet (325 mg total) by mouth daily with breakfast.  . hydrocortisone (CORTEF) 20 MG tablet Take 1 tablet every morning and 1/2 tablet in the evening (Patient taking differently: Take 10-20 mg by mouth See admin instructions. Take 1 tablet 20 mg) by mouth every morning and 1/2 tablet (10 mg) at bedtime)  . KEPPRA 500 MG tablet TAKE 3 TABLETS BY MOUTH TWO TIMES DAILY  . levothyroxine (SYNTHROID, LEVOTHROID) 88 MCG tablet Take 1 tablet (88 mcg total) by mouth daily.  . methocarbamol (ROBAXIN) 500 MG tablet Take 1 tablet (500 mg total) by mouth 3 (three) times daily as needed for muscle spasms. (Patient taking differently: Take 500 mg by mouth 3 (three) times daily as needed (back spasms). )  . omeprazole (PRILOSEC) 20 MG capsule TAKE 1 CAPSULE BY MOUTH  DAILY  . OVER THE COUNTER MEDICATION Place 1 drop into both eyes at bedtime. Over the counter lubricating eye drops  . pregabalin (LYRICA) 150 MG capsule Take 1 capsule (150 mg total) by mouth 2 (two) times daily.  . promethazine (PHENERGAN) 25 MG tablet  TAKE 1 TABLET BY MOUTH  EVERY 6 HOURS AS NEEDED FOR NAUSEA OR VOMITING. (Patient taking differently: TAKE 1 TABLET BY MOUTH  EVERY 6 HOURS AS NEEDED FOR NAUSEA OR VOMITING FROM MIGRAINES)  . rosuvastatin (CRESTOR) 10 MG tablet TAKE 1 TABLET BY MOUTH  EVERY MONDAY, WEDNESDAY,  FRIDAY  . traZODone (DESYREL) 50 MG tablet TAKE 1 TABLET BY MOUTH AT  BEDTIME    Current inpatient meds: Medications reviewed and reconciled.  Current Facility-Administered Medications  Medication Dose Route Frequency Provider Last Rate Last Dose  .  stroke: mapping our early stages of recovery book   Does not apply Once Darrel Reach, MD      . 0.9 %  sodium chloride infusion   Intra-arterial PRN Varney Biles, MD      . acetaminophen (TYLENOL) tablet 650 mg  650 mg Oral Q4H PRN Darrel Reach, MD  Or  . acetaminophen (TYLENOL) solution 650 mg  650 mg Per Tube Q4H PRN Darrel Reach, MD       Or  . acetaminophen (TYLENOL) suppository 650 mg  650 mg Rectal Q4H PRN Darrel Reach, MD      . morphine 4 MG/ML injection 1-4 mg  1-4 mg Intravenous Q1H PRN Darrel Reach, MD      . nicardipine (CARDENE) 20mg  in 0.86% saline 272ml IV infusion (0.1 mg/ml)  0-15 mg/hr Intravenous Continuous Varney Biles, MD 25 mL/hr at 06/15/16 1813 2.5 mg/hr at 06/15/16 1813  . pantoprazole (PROTONIX) injection 40 mg  40 mg Intravenous QHS Darrel Reach, MD      . potassium chloride 10 mEq in 100 mL IVPB  10 mEq Intravenous Q1 Hr x 4 Nanavati, Ankit, MD      . potassium chloride SA (K-DUR,KLOR-CON) CR tablet 20 mEq  20 mEq Oral Once Maczis, Michael M, PA-C      . senna-docusate (Senokot-S) tablet 1 tablet  1 tablet Oral BID Darrel Reach, MD       Current Outpatient Prescriptions  Medication Sig Dispense Refill  . Ascorbic Acid (VITAMIN C PO) Take 1 tablet by mouth daily.    Marland Kitchen aspirin EC 81 MG EC tablet Take 1 tablet (81 mg total) by mouth daily. 30 tablet 3  .  aspirin-acetaminophen-caffeine (EXCEDRIN MIGRAINE) 528-413-24 MG per tablet Take 2 tablets by mouth every 6 (six) hours as needed for headache.     . DULoxetine (CYMBALTA) 60 MG capsule TAKE 1 CAPSULE BY MOUTH  DAILY 90 capsule 1  . fenofibrate 160 MG tablet TAKE 1 TABLET BY MOUTH  EVERY DAY 90 tablet 1  . ferrous sulfate 325 (65 FE) MG tablet Take 1 tablet (325 mg total) by mouth daily with breakfast. 30 tablet 6  . hydrocortisone (CORTEF) 20 MG tablet Take 1 tablet every morning and 1/2 tablet in the evening (Patient taking differently: Take 10-20 mg by mouth See admin instructions. Take 1 tablet 20 mg) by mouth every morning and 1/2 tablet (10 mg) at bedtime) 45 tablet 3  . KEPPRA 500 MG tablet TAKE 3 TABLETS BY MOUTH TWO TIMES DAILY 540 tablet 1  . levothyroxine (SYNTHROID, LEVOTHROID) 88 MCG tablet Take 1 tablet (88 mcg total) by mouth daily. 90 tablet 3  . methocarbamol (ROBAXIN) 500 MG tablet Take 1 tablet (500 mg total) by mouth 3 (three) times daily as needed for muscle spasms. (Patient taking differently: Take 500 mg by mouth 3 (three) times daily as needed (back spasms). ) 60 tablet 1  . omeprazole (PRILOSEC) 20 MG capsule TAKE 1 CAPSULE BY MOUTH  DAILY 90 capsule 1  . OVER THE COUNTER MEDICATION Place 1 drop into both eyes at bedtime. Over the counter lubricating eye drops    . pregabalin (LYRICA) 150 MG capsule Take 1 capsule (150 mg total) by mouth 2 (two) times daily. 180 capsule 1  . promethazine (PHENERGAN) 25 MG tablet TAKE 1 TABLET BY MOUTH  EVERY 6 HOURS AS NEEDED FOR NAUSEA OR VOMITING. (Patient taking differently: TAKE 1 TABLET BY MOUTH  EVERY 6 HOURS AS NEEDED FOR NAUSEA OR VOMITING FROM MIGRAINES) 180 tablet 1  . rosuvastatin (CRESTOR) 10 MG tablet TAKE 1 TABLET BY MOUTH  EVERY MONDAY, WEDNESDAY,  FRIDAY 39 tablet 1  . traZODone (DESYREL) 50 MG tablet TAKE 1 TABLET BY MOUTH AT  BEDTIME 90 tablet 1  . hydrocortisone (CORTEF) 20 MG tablet TAKE 1 TABLET  BY MOUTH EVERY MORNING  AND1/2 TALBET IN THE EVENING (Patient not taking: Reported on 06/15/2016) 45 tablet 3  . metoprolol tartrate (LOPRESSOR) 25 MG tablet Take 0.5 tablets (12.5 mg total) by mouth 2 (two) times daily. (Patient not taking: Reported on 06/15/2016) 60 tablet 0  . nystatin (MYCOSTATIN/NYSTOP) powder Apply topically 4 (four) times daily. (Patient not taking: Reported on 06/15/2016) 45 g 6  . Vitamin D, Ergocalciferol, (DRISDOL) 50000 units CAPS capsule Take 1 capsule (50,000 Units total) by mouth every 7 (seven) days. (Patient not taking: Reported on 06/15/2016) 12 capsule 0    Allergies: Allergies  Allergen Reactions  . Versed [Midazolam] Other (See Comments) and Anaphylaxis    Severe respiratory depression, had to be masked in preop holding.  . Ciprofloxacin Nausea And Vomiting  . Cyclobenzaprine Itching  . Methocarbamol Itching       . Metronidazole Other (See Comments)    Unknown reaction  . Penicillins Rash    Has patient had a PCN reaction causing immediate rash, facial/tongue/throat swelling, SOB or lightheadedness with hypotension: Yes Has patient had a PCN reaction causing severe rash involving mucus membranes or skin necrosis: No Has patient had a PCN reaction that required hospitalization: No Has patient had a PCN reaction occurring within the last 10 years: No If all of the above answers are "NO", then may proceed with Cephalosporin use.    ROS: As per HPI. A full 14-point review of systems was performed and is otherwise unremarkable. This is somewhat limited by her language difficulties, however.  PE:  BP (!) 139/97 (BP Location: Right Arm)   Pulse (!) 105   Temp 98.4 F (36.9 C) (Oral)   Resp (!) 22   Ht 5\' 2"  (1.575 m)   Wt 104.3 kg (230 lb)   SpO2 100%   BMI 42.07 kg/m    General: WDWN, no acute distress. She is a cushingoid appearance. She has impaired fluency with word finding difficulties. Comprehension appears to be intact. No significant dysarthria. Follows commands  briskly. Affect is flat. Comportment is normal.  HEENT:  Neck supple without LAD. MMM, OP clear. Dentition good. Sclerae anicteric. No conjunctival injection.  CV: Regular, no murmur. Carotid pulses full and symmetric, no bruits. Distal pulses 2+ and symmetric.  Lungs: CTAB.  Abdomen: Soft, obese, non-distended, non-tender. Bowel sounds present x4.  Extremities: No C/C/E. Neuro:  CN: Pupils are equal and round. They are symmetrically reactive from 3-->2 mm. visual fields appear full. EOMI notable for hypomanic trick saccades without nystagmus. No reported diplopia. Facial sensation is intact to light touch. Face is symmetric at rest with normal strength and mobility. Her face is somewhat masked. Hearing is intact to conversational voice. Palate elevates symmetrically and uvula is midline. Voice is normal in tone, pitch and quality. Bilateral SCM and trapezii are 5/5. Tongue is midline with normal bulk and mobility.  Motor: Normal bulk, tone. Strength is normal in both upper extremity is. She has mild weakness of both lower extremity, possibly worse on the right than the left. No tremor or other abnormal movements. No drift.  Sensation: Intact to light touch.  DTRs: 3+, with the exception of 4+ in the left knee and ankle. Toes upgoing bilaterally.   Coordination: Finger-to-nose is slow and somewhat deliberate but without dysmetria. Finger taps are symmetric.    Labs:  Lab Results  Component Value Date   WBC 16.5 (H) 06/15/2016   HGB 12.3 06/15/2016   HCT 42.3 06/15/2016   PLT 393 06/15/2016  GLUCOSE 172 (H) 06/15/2016   CHOL 150 03/03/2016   TRIG 170 (H) 03/03/2016   HDL 46 (L) 03/03/2016   LDLDIRECT 131.6 05/16/2011   LDLCALC 70 03/03/2016   ALT 24 06/15/2016   AST 32 06/15/2016   NA 142 06/15/2016   K 3.3 (L) 06/15/2016   CL 108 06/15/2016   CREATININE 0.82 06/15/2016   BUN 13 06/15/2016   CO2 24 06/15/2016   TSH 0.02 (L) 05/05/2016   INR 1.24 11/27/2015   HGBA1C 6.5 05/06/2016     Imaging:  I have personally and independently reviewed the CT scan of the head without contrast from today. This shows a large hematoma that appears to be centered around the left basal ganglia. The hemorrhage extends into the ventricles. There is a large resection bed in the left frontal lobe where prior tumor was removed and hemorrhage extends into this resection bed as well. Hematoma was measured at 8.1 x 4.0 x 3.5 cm in its largest dimensions. There is mild surrounding vasogenic edema with approximately 3 mm of right to left midline shift. There is mild dilation of the left temporal horn. Extensive hypodensity is noted in the bihemispheric white matter consistent with prior radiation exposure.  CT angiogram chest PE, CT of the abdomen and pelvis with contrast: no evidence of pulmonary embolism. CT is felt to be low sensitivity for evaluating the chest for other pathology. No acute process was seen in the abdomen or pelvis. There is right nephrolithiasis. Hepatic steatosis and hepatomegaly are noted. Cardiomegaly is present. Mild T4 and T8 compression deformities are seen which were not apparent on a previous scan from 11/28/15. Atherosclerotic changes of the aorta are present.   Assessment and Plan:  1. Acute ICH: This is most likely related to weakening of vessels in the setting of prior surgery and radiation therapy. Other potential considerations include hypertensive hemorrhage and complication from antiplatelet therapy. Neurosurgery seen the patient and feel that there is no indication for urgent surgical intervention. They will continue to follow, appreciate their assistance. Will check coags and urine drug screen. Monitor blood pressure, goal SBP less than 140 mmHg. Avoid antiplatelet agents and NSAIDs for the 1-2 weeks. Avoid therapeutic systemic anticoagulation for the next 3-4 weeks. Repeat CTH in 24 hours. Use SCDs for DVT prophylaxis. Avoid fever and hyperglycemia as these are associated  with worse neurologic prognosis. Avoid hypotonic IVF to minimize the risk of exacerbating cerebral edema. Frequent neuro checks. ICH score 1.  2. Acute encephalopathy: Her husband reports an increase in word finding difficulties and language disturbance over her baseline. This is likely attributable to her hemorrhage. Supportive care. Will optimize metabolic status as needed. Limit CNS active medications, particularly benzodiazepines, opiates, and anything strong anticholinergic properties.  3. Addison's disease: Typically takes hydrocortisone 20 mg each morning and 10 mg each evening. Will increase this to 40 each morning and 20 each evening in the setting of acute illness to help minimize risk of crisis.  4. Hypothyroidism: Will switch her levothyroxine to IV formulation until she is able to take oral medications.  5. Seizure disorder: By report, seizures are well controlled on Keppra 500 mg twice daily. We'll continue her home dose but changed to IV until she is able to take oral medications.  This was discussed with the patient's husband. Education was provided on the diagnosis and expected evaluation and treatment. He is in agreement with the plan as noted. He was given the opportunity to ask any questions and these were  addressed to his satisfaction.

## 2016-06-15 NOTE — ED Notes (Signed)
No addl blood draw,  Pt enroute to inpatient. 

## 2016-06-15 NOTE — ED Provider Notes (Addendum)
  Physical Exam  BP (!) 172/101   Pulse (!) 104   Temp 98.4 F (36.9 C) (Oral)   Resp (!) 26   Ht 5\' 2"  (1.575 m)   Wt 104.3 kg (230 lb)   SpO2 97%   BMI 42.07 kg/m   Physical Exam  ED Course  Procedures  MDM Assuming care of patient from Dr. Sabra Heck   Patient in the ED for AMS / confusion. Pt also had abd tenderness. Pt has hx of adrenal insufficiency, brain neoplasm, obesity. Workup thus far shows lactic acidosis and elevated WC.  Concerning findings are as following elevated lactate. Important pending results are blood cultures, CT head, CT angio chest and CT abd // pelvis. No clear signs of infection, and clinically no infection.  According to Dr. Sabra Heck, plan is to f/u on CT scan and then admit patient for confusion workup.   Patient had no complains, no concerns from the nursing side. Will continue to monitor.    Vitals:   06/15/16 1600 06/15/16 1615  BP: (!) 172/119 (!) 172/101  Pulse: (!) 105 (!) 104  Resp: (!) 21 (!) 26  Temp:        Varney Biles, MD 06/15/16 1636   5:47 PM Pt's CT scan of the head is positive for the bleed. Spoke with the Radiologist, they think it is unlikely to be an aneurysmal bleed based on the location, but due ton intra-parenchymal extension, that cant be ruled out. They recommend MRI if further concerns. Spoke with Dr. Rita Ohara, Neurosurgery. He will see the patient. He doesn't think there is acute intervention indicated for this type of bleed. Spoke with Neurology, who will admit. BP parameters set at 140-150 mmhg. We will place arterial line. Nicardipine iv gtt ordered.  Results from the ER workup discussed with the patient face to face and all questions answered to the best of my ability.  CRITICAL CARE Performed by: Varney Biles   Total critical care time: 33 minutes  Critical care time was exclusive of separately billable procedures and treating other patients.  Critical care was necessary to treat or prevent  imminent or life-threatening deterioration.  Critical care was time spent personally by me on the following activities: development of treatment plan with patient and/or surrogate as well as nursing, discussions with consultants, evaluation of patient's response to treatment, examination of patient, obtaining history from patient or surrogate, ordering and performing treatments and interventions, ordering and review of laboratory studies, ordering and review of radiographic studies, pulse oximetry and re-evaluation of patient's condition.    Varney Biles, MD 06/15/16 1750

## 2016-06-15 NOTE — ED Provider Notes (Signed)
[facility] DEPT Provider Note   CSN: [id number] Arrival date & time: 06/15/16  1333     History   Chief Complaint Chief Complaint  Patient presents with  . Altered Mental Status    HPI [name] is a 61 y.o. female with a past medical history of Addison's disease, brain cancer, fibromyalgia, hypertension, secondary Parkinson's disease, seizure, stroke who presents today for altered mental status 3 days. On arrival the patient was tachycardic, hypoxic, tachypneic with increased work of breathing. The history is gathered by the patient's husband 11 years. The husband states that over the last 3 days the patient has become more confused. "She is unable to answer basic questions such as what would she like for dinner". The husband states that she has forgotten how to do certain functions such as use a remote. He also states that her short-term memory has depleted. He will ask her to repeat back what she she just heard on TV and after 30 seconds she is unable to. Prior to this change in mental status the patient was able to hold full conversations and care for herself. The patient is wheelchair-bound due to secondary Parkinson's disease. The husband states that she is normally able to transfer herself from her wheelchair to bed/couch etc. without becoming short of breath but now becomes very short of breath as if "she just finished a hard workout". The patient is primarily immobile at home resting and wheelchairs or in recliner during the day. She had a recent travel in 3 hour car ride to [employer] two weeks ago. No travel by plane. The patient was diagnosed with a pulmonary embolism when she was admitted for sepsis back in November 2017. The husband states that she presented almost identically to this however she during that admission she was also hallucinating. The patient was discharged home on a Eliquis following the PE however only took this for 30 days prior to switching to 81mg  ASA daily  due to the comfort of the patient and her husband.The patient has history of CVA 2 years ago without any sequela. The husband states the patient has not complained of any fever, chills, chest pain, abdominal pain. She has been without diarrhea or vomiting. No recent falls. Only recent addition of medication was iron which has caused her stools to look melanous for some time now. Doesn't states that she has had 3 different surgeries in the past on her brain due to her seizure and brain cancer. He states that she still has cancer in the brain however it is not growing and last MRI of the brain showed a decrease in swelling. She has a history of appendectomy and a cholecystectomy in the past.   HPI  Past Medical History:  Diagnosis Date  . Addison's disease ([place])   . Anemia 03/06/2013  . Bladder spasm   . Brain cancer ([place])    Frontal lobe, 1993 and 2005  . Fibromyalgia   . GERD (gastroesophageal reflux disease)   . Hyperchloremia   . Hypertension   . 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 Migraines   . Morton neuroma   . Right fibular fracture 07/12/2011  . Secondary Parkinson disease ([place])   . Seizures ([place])   . Stroke [facility])     Patient Active Problem List   Diagnosis Date Noted  . Fatigue 05/05/2016  . Acute pulmonary embolus ([place]) 11/28/2015  .  Seizure disorder ([place]) 11/27/2015  . Adrenal insufficiency ([place]) 11/22/2014  . 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 Essential hypertension   . 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 Stroke-like symptoms   . Cognitive impairment 02/09/2014  . Gross motor impairment 02/09/2014  . Esophageal stricture 12/28/2013  . GI bleed 12/27/2013  . CVA (cerebral infarction) 12/27/2013  . Dysphagia, pharyngoesophageal phase 12/27/2013  . Dysphagia   . Secondary Parkinson disease ([place])   . UTI (urinary tract infection) 03/06/2013  . Weakness of both legs 03/06/2013  . Anemia  03/06/2013  . Weakness 03/06/2013  . Persistent disorder of initiating or maintaining sleep 03/14/2011  . Routine general medical examination at a health care facility 12/04/2010  . Migraine 08/30/2010  . Fibromyalgia 04/05/2010  . DEPRESSIVE DISORDER 11/27/2009  . MYALGIA 11/27/2009  . LEG CRAMPS 11/27/2009  . INSOMNIA-SLEEP DISORDER-UNSPEC 11/27/2009  . DYSURIA 08/27/2009  . FUNGAL DERMATITIS 07/16/2009  . Obesity 07/16/2009  . Hyperlipidemia 10/16/2008  . 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 BACK PAIN, THORACIC REGION 09/18/2008    Past Surgical History:  Procedure Laterality Date  . APPENDECTOMY  1976  . brain cancer     1993 and 2005  . CHOLECYSTECTOMY  08/02/2013   Procedure: LAPAROSCOPIC CHOLECYSTECTOMY;  Surgeon: Gwenyth Ober, MD;  [facility] OR;  Service: General;;  . ESOPHAGOGASTRODUODENOSCOPY N/A 12/28/2013   Procedure: ESOPHAGOGASTRODUODENOSCOPY (EGD);  Surgeon: Inda Castle, MD;  Location: [facility];  Service: Endoscopy;  Laterality: N/A;  . EXCISION MORTON'S NEUROMA     right foot  . ORIF ANKLE FRACTURE  07/12/2011   Procedure: OPEN REDUCTION INTERNAL FIXATION (ORIF) ANKLE FRACTURE;  Surgeon: Johnny Bridge, MD;  Location: [facility];  Service: Orthopedics;  Laterality: Right;  . SAVORY DILATION N/A 12/28/2013   Procedure: SAVORY DILATION;  Surgeon: Inda Castle, MD;  Location: [facility];  Service: Endoscopy;  Laterality: N/A;    OB History    No data available       Home Medications    Prior to Admission medications   Medication Sig Start Date End Date Taking? Authorizing Provider  aspirin EC 81 MG EC tablet Take 1 tablet (81 mg total) by mouth daily. 12/29/13   Bonnielee Haff, MD  aspirin-acetaminophen-caffeine [facility] MIGRAINE) [phone] MG per tablet Take by mouth every 6 (six) hours as needed for headache.    [provider]  Calcium Citrate-Vitamin D (CITRACAL + D PO) Take 1 tablet by mouth daily.     [provider]  clotrimazole-betamethasone (LOTRISONE) cream APPLY 1 APPLICATON ON THE SKIN TWICE A DAY 01/26/16   [provider]  DULoxetine (CYMBALTA) 60 MG capsule TAKE 1 CAPSULE BY MOUTH  DAILY 06/05/16   Midge Minium, MD  fenofibrate 160 MG tablet TAKE 1 TABLET BY MOUTH  EVERY DAY 06/05/16   Midge Minium, MD  ferrous sulfate 325 (65 FE) MG tablet Take 1 tablet (325 mg total) by mouth daily with breakfast. 05/07/16   Midge Minium, MD  hydrocortisone (CORTEF) 20 MG tablet Take 1 tablet every morning and 1/2 tablet in the evening 04/28/16   Elayne Snare, MD  hydrocortisone (CORTEF) 20 MG tablet TAKE 1 TABLET BY MOUTH EVERY MORNING AND1/2 TALBET IN THE EVENING 06/06/16   Elayne Snare, MD  KEPPRA 500 MG tablet TAKE 3 TABLETS BY MOUTH TWO TIMES DAILY 06/05/16   Midge Minium, MD  levothyroxine (SYNTHROID, LEVOTHROID) 88 MCG tablet Take 1 tablet (88 mcg total) by  mouth daily. 01/10/16   Elayne Snare, MD  methocarbamol (ROBAXIN) 500 MG tablet Take 1 tablet (500 mg total) by mouth 3 (three) times daily as needed for muscle spasms. 06/11/16   Midge Minium, MD  metoprolol tartrate (LOPRESSOR) 25 MG tablet Take 0.5 tablets (12.5 mg total) by mouth 2 (two) times daily. 12/03/15   Orson Eva, MD  nystatin (MYCOSTATIN/NYSTOP) powder Apply topically 4 (four) times daily. 01/18/16   Midge Minium, MD  omeprazole (PRILOSEC) 20 MG capsule TAKE 1 CAPSULE BY MOUTH  DAILY 06/05/16   Midge Minium, MD  pregabalin (LYRICA) 150 MG capsule Take 1 capsule (150 mg total) by mouth 2 (two) times daily. 02/08/16   Midge Minium, MD  promethazine (PHENERGAN) 25 MG tablet TAKE 1 TABLET BY MOUTH  EVERY 6 HOURS AS NEEDED FOR NAUSEA OR VOMITING. 06/05/16   Midge Minium, MD  rosuvastatin (CRESTOR) 10 MG tablet TAKE 1 TABLET BY MOUTH  EVERY MONDAY, [place],  FRIDAY 06/05/16   Midge Minium, MD  traZODone (DESYREL) 50 MG tablet TAKE 1 TABLET BY MOUTH AT  BEDTIME 06/05/16    Midge Minium, MD  Vitamin D, Ergocalciferol, (DRISDOL) 50000 units CAPS capsule Take 1 capsule (50,000 Units total) by mouth every 7 (seven) days. 03/04/16   Midge Minium, MD    Family History Family History  Problem Relation Age of Onset  . Diabetes Mother   . Hypertension Mother   . Hypertension Father   . Diabetes Brother     Social History Social History  Substance Use Topics  . Smoking status: Never Smoker  . Smokeless tobacco: Never Used  . Alcohol use No     Allergies   Versed [midazolam]; Ciprofloxacin; Cyclobenzaprine; Methocarbamol; Metronidazole; and Penicillins   Review of Systems Review of Systems  Unable to perform ROS: Mental status change   Level 5 caveat    Physical Exam Updated Vital Signs BP (!) 176/93   Pulse (!) 102   Temp 98.4 F (36.9 C) (Oral)   Resp 20   Ht 5\' 2"  (1.575 m)   Wt 104.3 kg (230 lb)   SpO2 97%   BMI 42.07 kg/m   Physical Exam  Constitutional: She appears well-developed and well-nourished.  Overweight female, nontoxic appearing.  HENT:  Head: Normocephalic and atraumatic.  Mouth/Throat: Oropharynx is clear and moist.  Eyes: Conjunctivae are normal. Pupils are equal, round, and reactive to light.  Neck: Neck supple.  Cardiovascular: Regular rhythm, intact distal pulses and normal pulses.  Tachycardia present.   No murmur heard. Pulses:      Radial pulses are 2+ on the right side, and 2+ on the left side.       Dorsalis pedis pulses are 2+ on the right side, and 2+ on the left side.  Pulmonary/Chest: Effort normal. She has decreased breath sounds. She exhibits no tenderness.  Abdominal: Soft. Bowel sounds are normal. There is generalized tenderness. There is no rebound, no guarding, no CVA tenderness and negative Murphy's sign.  Musculoskeletal: She exhibits no edema.  Lymphadenopathy:    She has no cervical adenopathy.  Neurological: She is alert.  Cranial nerves III through XII intact. No facial  droop. Follows commands. Moves all extremities 4. Normal strength to bilateral upper and lower extremities including grip. Sensation normal to light touch upper and lower bilaterally. Speech is clear. PERRLA. EOMI. Husband states movement is similar to baseline.    Skin: Skin is warm and dry. No rash noted.  Psychiatric: She has a normal mood and affect.  Nursing note and vitals reviewed.    ED Treatments / Results  Labs (all labs ordered are listed, but only abnormal results are displayed) Labs Reviewed  COMPREHENSIVE METABOLIC PANEL - Abnormal; Notable for the following:       Result Value   Potassium 3.3 (*)    Glucose, Bld 172 (*)    All other components within normal limits  CBC - Abnormal; Notable for the following:    WBC 16.5 (*)    RBC 5.16 (*)    MCH 23.8 (*)    MCHC 29.1 (*)    RDW 21.0 (*)    All other components within normal limits  URINALYSIS, ROUTINE W REFLEX MICROSCOPIC - Abnormal; Notable for the following:    Specific Gravity, Urine 1.032 (*)    Protein, ur 100 (*)    Squamous Epithelial / LPF 0-5 (*)    All other components within normal limits  CBG MONITORING, ED - Abnormal; Notable for the following:    Glucose-Capillary 158 (*)    All other components within normal limits  I-STAT CG4 LACTIC ACID, ED - Abnormal; Notable for the following:    Lactic Acid, Venous 3.05 (*)    All other components within normal limits  BRAIN NATRIURETIC PEPTIDE  TSH  I-STAT TROPOININ, ED  I-STAT CG4 LACTIC ACID, ED    EKG  EKG Interpretation  Date/Time:  Sunday June 15 2016 16:18:51 EDT Ventricular Rate:  99 PR Interval:    QRS Duration: 121 QT Interval:  374 QTC Calculation: 480 R Axis:   -51 Text Interpretation:  Sinus rhythm LVH with IVCD, LAD and secondary repol abnrm Since last tracing rate faster Confirmed by Miller, Brian ([id number]) on 06/15/2016 4:22:40 PM       Radiology Dg Chest Port 1 View  Result Date: 06/15/2016 CLINICAL DATA:  Shortness of breath  with altered mental status EXAM: PORTABLE CHEST 1 VIEW COMPARISON:  November 30, 2015 FINDINGS: There is no edema or consolidation. There is slight atelectasis in the right base. Heart size and pulmonary vascularity are normal. No adenopathy. No evident bone lesions. IMPRESSION: Right base atelectasis. No edema or consolidation. Stable cardiac silhouette. Electronically Signed   By: William  Woodruff III M.D.   On: 06/15/2016 15:04    Procedures Procedures (including critical care time)  Medications Ordered in ED Medications  potassium chloride SA (K-DUR,KLOR-CON) CR tablet 20 mEq (not administered)  iopamidol (ISOVUE-370) 76 % injection (100 mLs Intravenous Contrast Given 06/15/16 1646)     Initial Impression / Assessment and Plan / ED Course  I have reviewed the triage vital signs and the nursing notes.  Pertinent labs & imaging results that were available during my care of the patient were reviewed by me and considered in my medical decision making (see chart for details).     60  year old female who presents today with altered mental status 3 days with associated shortness of breath. On presentation the patient was tachycardic, hypoxic, tachypnea with increased work of breathing. The patient was seen here in November 2017 for similar presentation where she was diagnosed with sepsis and acute hypoxia due to a pulmonary embolism. The patient was discharged from that hospitalization on Eliquis, however only took this for 30 days and then switched to aspirin. On examination the patient tachycardic. She has diffuse abdominal tenderness. Neuro exam unremarkable from patient baseline. No lower extremity edema.  Basic labs obtained. Lactic acid, UA, CXR, BNP, CBG ordred to  identify source of AMS. CT head ordered.  Leukocytosis present. Lactic acidosis present. Hypokalemia present. Will replace orally. Renal function intact. No anemia present. BNP unremarkable.   CXR with right base atelectasis.  No edema or consolidation. EKG sinus rhythm, LVH with IVCD, LAD.  UA negative for UTI.   CT angio of chest ordered to evaluate for possible PE. CT abdomen ordered to investigate for diffuse tenderness on exam.   Patient seen and discussed in collaboration with Dr. Sabra Heck. Patient signed out to Dr. Kathrynn Humble with plan to admit the patient pending CT results.    Final Clinical Impressions(s) / ED Diagnoses   Final diagnoses:  None    New Prescriptions New Prescriptions   No medications on file     Lorelle Gibbs 06/15/16 1835    Jillyn Ledger, PA-C 06/16/16 0459    Noemi Chapel, MD 06/17/16 1224

## 2016-06-15 NOTE — ED Provider Notes (Signed)
The patient is a 61 year old female, she has secondary Parkinson's after undergoing treatment for brain mass as well as a history of a stroke, she presents to the hospital with her significant other because of increased amounts of confusion, difficulty answering questions, increasing amounts of weakness, she cannot even make the remote control work on the television which is very abnormal for her. There is been no fevers vomiting diarrhea coughing or shortness of breath and he denies any rashes. The significant other's the primary historian.  Level V caveat applies  Review of systems not obtainable secondary to altered mental status  The patient is morbidly obese, she has a soft nontender abdomen, she has clear heart and lung sounds but does have a mild tachycardia to 105. She has clear oropharynx, she is able to follow commands but when asked questions she is clearly disoriented and unable to answer very simple questions.  The etiology of the patient's symptoms is not entirely clear, at this time I reviewed her lab work, urinalysis pending, she will likely need admission to the hospital to rule out other sources including worsening stroke, she does not appear critically ill at this time.  CT scan of the brain, CT scan of the chest abdomen pelvis as well as she has recently had a pulmonary embolism and does have some abdominal tenderness.  Change of shift there is no definite etiology, she does have a lactic acidosis and a lactate of 3, change of shift, care signed out to Dr. Kathrynn Humble who will follow up results and disposition accordingly     EKG Interpretation  Date/Time:  Sunday June 15 2016 16:18:51 EDT Ventricular Rate:  99 PR Interval:    QRS Duration: 121 QT Interval:  374 QTC Calculation: 480 R Axis:   -51 Text Interpretation:  Sinus rhythm LVH with IVCD, LAD and secondary repol abnrm Since last tracing rate faster Confirmed by Noemi Chapel [phone]) on 06/15/2016 4:22:40 PM        Medical screening examination/treatment/procedure(s) were conducted as a shared visit with non-physician practitioner(s) and myself.  I personally evaluated the patient during the encounter.  Clinical Impression:   Final diagnoses:  None         Noemi Chapel, MD 06/17/16 1224

## 2016-06-15 NOTE — Consult Note (Signed)
Reason for Consult:  Left lateral and third ventricle intraventricular hemorrhage Referring Physician:  Dr. Varney Biles  [name] is an 61 y.o. right-handed white female.  HPI: Patient was brought to the [facility] emergency room by her husband early this afternoon because of altered neurologic responsiveness that began 3 days ago and has gradually increased. History is obtained from review of the record in Epic and from the patient's husband, who was at her bedside.  Her history is notable for having had a ganglioglioma resected in 1993 by Dr. Jenne Campus at [facility]. She underwent re-resection for recurrent tumor again at [facility] in 2004 followed by radiation therapy in 2005. She continues to be followed by both neurosurgery and radiation oncology at [facility]. Over the past several years she's had a number of difficulties arise including Addison's disease and hypothyroidism which are managed by Dr. Elayne Snare, a pulmonary embolism in the fall of 2017 treated with Eliquis 30 days, then aspirin 81 mg daily. She's had several neurologic difficulties develop over the past several years, including secondary Parkinson disease, seizure, and stroke, and has been managed by the neurology hospitalist here at [facility]. Previous MRI of the brain has revealed significant left frontal encephalomalacia and ex vacuo enlargement of the left lateral ventricle.  Patient's husband explains that he began to notice that her responsiveness and language function declined about 3 days ago. He found that he had to assist her to a greater extent in mobility and transfers. He explained that this has happened, in a self-limited fashion, in the past, but because this persisted for 3 days and gradually worsened, he felt that it was important to bring her to the emergency room for evaluation.  The patient herself denies any headaches, and she apparently has not had any  observed seizure activity.  CT of the brain was done following initial evaluation in the emergency room. It revealed a small area of hemorrhage in the junction between the area of left frontal encephalomalacia and immediately adjacent gliiosis, which is extended into the left lateral ventricle, and filled the previously documented dilated left lateral ventricle, as well as extending into the third ventricle. Notably there is no hydrocephalus, and there is no dilation of the right lateral ventricle. There is significant generalized atrophy, in addition to the area of significant left frontal encephalomalacia.   Past Medical History:  Past Medical History:  Diagnosis Date  . Addison's disease ([place])   . Anemia 03/06/2013  . Bladder spasm   . Brain cancer ([place])    Frontal lobe, 1993 and 2005  . Fibromyalgia   . GERD (gastroesophageal reflux disease)   . Hyperchloremia   . Hypertension   . 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 Migraines   . Morton neuroma   . Right fibular fracture 07/12/2011  . Secondary Parkinson disease ([place])   . Seizures ([place])   . Stroke [facility])     Past Surgical History:  Past Surgical History:  Procedure Laterality Date  . APPENDECTOMY  1976  . brain cancer     1993 and 2005  . CHOLECYSTECTOMY  08/02/2013   Procedure: LAPAROSCOPIC CHOLECYSTECTOMY;  Surgeon: Gwenyth Ober, MD;  Location: [facility] OR;  Service: General;;  . ESOPHAGOGASTRODUODENOSCOPY N/A 12/28/2013   Procedure: ESOPHAGOGASTRODUODENOSCOPY (EGD);  Surgeon: Inda Castle, MD;  Location: [place];  Service: Endoscopy;  Laterality: N/A;  .  EXCISION MORTON'S NEUROMA     right foot  . ORIF ANKLE FRACTURE  07/12/2011   Procedure: OPEN REDUCTION INTERNAL FIXATION (ORIF) ANKLE FRACTURE;  Surgeon: Johnny Bridge, MD;  Location: [place];  Service: Orthopedics;  Laterality: Right;  . SAVORY DILATION N/A 12/28/2013   Procedure: SAVORY  DILATION;  Surgeon: Inda Castle, MD;  Location: [place];  Service: Endoscopy;  Laterality: N/A;    Family History:  Family History  Problem Relation Age of Onset  . Diabetes Mother   . Hypertension Mother   . Hypertension Father   . Diabetes Brother     Social History:  reports that she has never smoked. She has never used smokeless tobacco. She reports that she does not drink alcohol or use drugs.  Allergies:  Allergies  Allergen Reactions  . Versed [Midazolam] Other (See Comments) and Anaphylaxis    Severe respiratory depression, had to be masked in preop holding.  . Ciprofloxacin Nausea And Vomiting  . Cyclobenzaprine Itching  . Methocarbamol Itching       . Metronidazole Other (See Comments)    Unknown reaction  . Penicillins Rash    Has patient had a PCN reaction causing immediate rash, facial/tongue/throat swelling, SOB or lightheadedness with hypotension: Yes Has patient had a PCN reaction causing severe rash involving mucus membranes or skin necrosis: No Has patient had a PCN reaction that required hospitalization: No Has patient had a PCN reaction occurring within the last 10 years: No If all of the above answers are "NO", then may proceed with Cephalosporin use.    Medications: I have reviewed the patient's current medications.  ROS:  Not obtainable due to patient's underlying as well as new deficits.  Physical Examination: Obese white female in no acute distress. Blood pressure (!) 139/97, pulse (!) 105, temperature 98.4 F (36.9 C), temperature source Oral, resp. rate (!) 22, height '5\' 2"'$  (1.575 m), weight 104.3 kg (230 lb), SpO2 100 %. Lungs:  Clear to ascultation, symmetrical respiratory excursion. Heart:  Mild sinus tachycardia. No murmur. Abdomen:  Soft, nondistended, nontender, bowel sounds present.  Extremity:  No clubbing, cyanosis, or edema.  Neurological Examination: Mental Status Examination:  Awake and alert, oriented to name, but not to  place or year. Follows commands briskly. Speech fluent, but somewhat slow and limited. Cranial Nerve Examination:  Pupils equal, round, 3 mm bilaterally, reactive to light. EOMI. Facial sensation intact. Face is symmetrical. Hearing present bilaterally. Palatal movements symmetrical. Shoulder shrug symmetrical. Tongue midline. Motor Examination:  Moves left upper and lower extremity more vigorously than right upper and lower extremity, but actual strength testing shows 5/5 strength bilaterally. Sensory Examination:  Intact to pinprick throughout. Reflex Examination:   Reflexes are 2 in the left upper and lower extremity and 2-3 in the right upper and lower extremity.  Gait and Stance Examination:  Not tested due to the nature condition.   Results for orders placed or performed during the hospital encounter of 06/15/16 (from the past 48 hour(s))  CBG monitoring, ED     Status: Abnormal   Collection Time: 06/15/16  1:41 PM  Result Value Ref Range   Glucose-Capillary 158 (H) 65 - 99 mg/dL  Comprehensive metabolic panel     Status: Abnormal   Collection Time: 06/15/16  1:57 PM  Result Value Ref Range   Sodium 142 135 - 145 mmol/L   Potassium 3.3 (L) 3.5 - 5.1 mmol/L   Chloride 108 101 - 111 mmol/L   CO2 24 22 -  32 mmol/L   Glucose, Bld 172 (H) 65 - 99 mg/dL   BUN 13 6 - 20 mg/dL   Creatinine, Ser 3.79 0.44 - 1.00 mg/dL   Calcium 9.7 8.9 - 90.9 mg/dL   Total Protein 7.1 6.5 - 8.1 g/dL   Albumin 3.8 3.5 - 5.0 g/dL   AST 32 15 - 41 U/L   ALT 24 14 - 54 U/L   Alkaline Phosphatase 64 38 - 126 U/L   Total Bilirubin 0.4 0.3 - 1.2 mg/dL   GFR calc non Af Amer >60 >60 mL/min   GFR calc Af Amer >60 >60 mL/min    Comment: (NOTE) The eGFR has been calculated using the CKD EPI equation. This calculation has not been validated in all clinical situations. eGFR's persistently <60 mL/min signify possible Chronic Kidney Disease.    Anion gap 10 5 - 15  CBC     Status: Abnormal   Collection Time:  06/15/16  1:57 PM  Result Value Ref Range   WBC 16.5 (H) 4.0 - 10.5 K/uL   RBC 5.16 (H) 3.87 - 5.11 MIL/uL   Hemoglobin 12.3 12.0 - 15.0 g/dL   HCT 40.0 05.0 - 56.7 %   MCV 82.0 78.0 - 100.0 fL   MCH 23.8 (L) 26.0 - 34.0 pg   MCHC 29.1 (L) 30.0 - 36.0 g/dL   RDW 88.9 (H) 33.8 - 82.6 %   Platelets 393 150 - 400 K/uL  Brain natriuretic peptide     Status: None   Collection Time: 06/15/16  2:39 PM  Result Value Ref Range   B Natriuretic Peptide 28.2 0.0 - 100.0 pg/mL  I-Stat CG4 Lactic Acid, ED     Status: Abnormal   Collection Time: 06/15/16  3:07 PM  Result Value Ref Range   Lactic Acid, Venous 3.05 (HH) 0.5 - 1.9 mmol/L   Comment NOTIFIED PHYSICIAN   Urinalysis, Routine w reflex microscopic     Status: Abnormal   Collection Time: 06/15/16  3:47 PM  Result Value Ref Range   Color, Urine YELLOW YELLOW   APPearance CLEAR CLEAR   Specific Gravity, Urine 1.032 (H) 1.005 - 1.030   pH 5.0 5.0 - 8.0   Glucose, UA NEGATIVE NEGATIVE mg/dL   Hgb urine dipstick NEGATIVE NEGATIVE   Bilirubin Urine NEGATIVE NEGATIVE   Ketones, ur NEGATIVE NEGATIVE mg/dL   Protein, ur 666 (A) NEGATIVE mg/dL   Nitrite NEGATIVE NEGATIVE   Leukocytes, UA NEGATIVE NEGATIVE   RBC / HPF 0-5 0 - 5 RBC/hpf   WBC, UA 0-5 0 - 5 WBC/hpf   Bacteria, UA NONE SEEN NONE SEEN   Squamous Epithelial / LPF 0-5 (A) NONE SEEN   Mucous PRESENT    Hyaline Casts, UA PRESENT    Ca Oxalate Crys, UA PRESENT     Ct Head Wo Contrast  Result Date: 06/15/2016 CLINICAL DATA:  Confusion.  History of brain neoplasm. EXAM: CT HEAD WITHOUT CONTRAST TECHNIQUE: Contiguous axial images were obtained from the base of the skull through the vertex without intravenous contrast. COMPARISON:  Brain MRI 11/29/2015 FINDINGS: Brain: There is a large intraparenchymal hematoma centered in the left basal ganglia with intraventricular extension and extension into the left frontal resection bed. The hematoma measures approximately 8.1 x 4.00 x 3.5 cm.  There is surrounding edema and mild rightward mass effect resulting in 3 mm of midline shift at the level of the foramina of [place]. There is hemorrhage that extends into the left temporal horn and third  ventricle and frontal horn of the left lateral ventricle. The left temporal horn is mildly dilated, suggesting early ventricular trapping. Vascular: No hyperdense vessel or unexpected calcification. Skull: Remote left frontal craniotomy. Sinuses/Orbits: No sinus fluid levels or advanced mucosal thickening. No mastoid effusion. Normal orbits. IMPRESSION: 1. Massive intraparenchymal hematoma, extending throughout much of the left hemisphere, but probably originating in the left basal ganglia. The appearance is more suggestive of hypertensive hemorrhage rather than hemorrhagic mass. 3 mm rightward midline shift. 2. Extension of blood into the extra-axial space, within the left frontal lobe resection cavity and left lateral and third ventricles. There is mild dilatation of the left lateral ventricle temporal horn, suggesting early ventricular trapping. 3. Left frontal lobe resection site containing a portion of extra-axial hemorrhage extension. No mass lesion visisble. Critical Value/emergent results were called by telephone at the time of interpretation on 06/15/2016 at 5:12 pm to Dr. Varney Biles, who verbally acknowledged these results. Electronically Signed   By: Ulyses Jarred M.D.   On: 06/15/2016 17:17   Ct Angio Chest Pe W/cm &/or Wo Cm  Result Date: 06/15/2016 CLINICAL DATA:  Confusion. Weakness. History of brain neoplasm. Lactic acidosis and elevated white blood cell count. EXAM: CT ANGIOGRAPHY CHEST CT ABDOMEN AND PELVIS WITH CONTRAST TECHNIQUE: Multidetector CT imaging of the chest was performed using the standard protocol during bolus administration of intravenous contrast. Multiplanar CT image reconstructions and MIPs were obtained to evaluate the vascular anatomy. Multidetector CT imaging of the  abdomen and pelvis was performed using the standard protocol during bolus administration of intravenous contrast. CONTRAST:  100 cc of Isovue 370 COMPARISON:  Brain MR of 11/29/2015. Head CT of earlier today. Chest radiograph of earlier today. Chest CT 11/28/2015. Abdominopelvic CT of 07/31/2013. FINDINGS: CTA CHEST FINDINGS Cardiovascular: The quality of this exam for evaluation of pulmonary embolism is moderate to good. The bolus is well timed. No evidence of pulmonary embolism. Mild cardiomegaly, without pericardial effusion. Normal aortic caliber, without dissection. Mediastinum/Nodes: No mediastinal or hilar adenopathy. Lungs/Pleura: Minimal right-sided pleural thickening. Multifactorial degradation, including patient body habitus and mild motion. Right upper lobe scarring or subsegmental atelectasis. Mosaic attenuation which is likely related to air trapping and hypoventilation. Musculoskeletal: Superior endplate irregularity T4 and T8 are new since the prior CT. Moderate right hemidiaphragm elevation. Review of the MIP images confirms the above findings. CT ABDOMEN and PELVIS FINDINGS Hepatobiliary: Mild hepatic steatosis, without focal liver lesion. Hepatomegaly at 20.5 cm craniocaudal. Cholecystectomy, without biliary ductal dilatation. Pancreas: Normal, without mass or ductal dilatation. Spleen: Normal in size, without focal abnormality. Adrenals/Urinary Tract: Normal adrenal glands. 5x10 mm inter/lower pole right renal collecting system calculus. Left renal cortical thinning is mild. No hydronephrosis. Normal urinary bladder. Stomach/Bowel: Normal stomach, without wall thickening. Normal colon and terminal ileum. Periampullary duodenal diverticulum is small. Otherwise normal small bowel. Vascular/Lymphatic: Aortic atherosclerosis. No abdominopelvic adenopathy. Reproductive: Normal uterus and adnexa. Other: No significant free fluid.  No free intraperitoneal air. Musculoskeletal: No acute osseous  abnormality. Review of the MIP images confirms the above findings. IMPRESSION: 1.  No evidence of pulmonary embolism. 2. Otherwise, low sensitivity evaluation of the chest secondary to multiple factors as detailed above. 3.  No acute process in the abdomen or pelvis. 4. Right nephrolithiasis. 5. Hepatic steatosis and hepatomegaly. 6. Cardiomegaly. 7. Mild T4 and T8 compression deformities, new since 11/28/2015. 8.  Aortic atherosclerosis. Electronically Signed   By: Abigail Miyamoto M.D.   On: 06/15/2016 17:40   Ct Abdomen Pelvis W Contrast  Result Date: 06/15/2016 CLINICAL DATA:  Confusion. Weakness. History of brain neoplasm. Lactic acidosis and elevated white blood cell count. EXAM: CT ANGIOGRAPHY CHEST CT ABDOMEN AND PELVIS WITH CONTRAST TECHNIQUE: Multidetector CT imaging of the chest was performed using the standard protocol during bolus administration of intravenous contrast. Multiplanar CT image reconstructions and MIPs were obtained to evaluate the vascular anatomy. Multidetector CT imaging of the abdomen and pelvis was performed using the standard protocol during bolus administration of intravenous contrast. CONTRAST:  100 cc of Isovue 370 COMPARISON:  Brain MR of 11/29/2015. Head CT of earlier today. Chest radiograph of earlier today. Chest CT 11/28/2015. Abdominopelvic CT of 07/31/2013. FINDINGS: CTA CHEST FINDINGS Cardiovascular: The quality of this exam for evaluation of pulmonary embolism is moderate to good. The bolus is well timed. No evidence of pulmonary embolism. Mild cardiomegaly, without pericardial effusion. Normal aortic caliber, without dissection. Mediastinum/Nodes: No mediastinal or hilar adenopathy. Lungs/Pleura: Minimal right-sided pleural thickening. Multifactorial degradation, including patient body habitus and mild motion. Right upper lobe scarring or subsegmental atelectasis. Mosaic attenuation which is likely related to air trapping and hypoventilation. Musculoskeletal: Superior  endplate irregularity T4 and T8 are new since the prior CT. Moderate right hemidiaphragm elevation. Review of the MIP images confirms the above findings. CT ABDOMEN and PELVIS FINDINGS Hepatobiliary: Mild hepatic steatosis, without focal liver lesion. Hepatomegaly at 20.5 cm craniocaudal. Cholecystectomy, without biliary ductal dilatation. Pancreas: Normal, without mass or ductal dilatation. Spleen: Normal in size, without focal abnormality. Adrenals/Urinary Tract: Normal adrenal glands. 5x10 mm inter/lower pole right renal collecting system calculus. Left renal cortical thinning is mild. No hydronephrosis. Normal urinary bladder. Stomach/Bowel: Normal stomach, without wall thickening. Normal colon and terminal ileum. Periampullary duodenal diverticulum is small. Otherwise normal small bowel. Vascular/Lymphatic: Aortic atherosclerosis. No abdominopelvic adenopathy. Reproductive: Normal uterus and adnexa. Other: No significant free fluid.  No free intraperitoneal air. Musculoskeletal: No acute osseous abnormality. Review of the MIP images confirms the above findings. IMPRESSION: 1.  No evidence of pulmonary embolism. 2. Otherwise, low sensitivity evaluation of the chest secondary to multiple factors as detailed above. 3.  No acute process in the abdomen or pelvis. 4. Right nephrolithiasis. 5. Hepatic steatosis and hepatomegaly. 6. Cardiomegaly. 7. Mild T4 and T8 compression deformities, new since 11/28/2015. 8.  Aortic atherosclerosis. Electronically Signed   By: Abigail Miyamoto M.D.   On: 06/15/2016 17:40   Dg Chest Port 1 View  Result Date: 06/15/2016 CLINICAL DATA:  Shortness of breath with altered mental status EXAM: PORTABLE CHEST 1 VIEW COMPARISON:  November 30, 2015 FINDINGS: There is no edema or consolidation. There is slight atelectasis in the right base. Heart size and pulmonary vascularity are normal. No adenopathy. No evident bone lesions. IMPRESSION: Right base atelectasis. No edema or consolidation.  Stable cardiac silhouette. Electronically Signed   By: Lowella Grip III M.D.   On: 06/15/2016 15:04     Assessment/Plan: Patient with history of left frontal ganglioglioma managed at [facility], status post resection 1993 and 2004, status post radiation therapy 2005. Seen several times here at [facility] by neurology hospitalist, and apparently has had secondary parkinsonism, seizure, and stroke. 3 day history of decreased neurologic response and function. Patient's been found to have a small area of parenchymal hemorrhage in the left frontal region in the area of gliosis, immediately adjacent to a area of left frontal encephalomalacia, with significant secondary intraventricular hemorrhage filling the previously documented enlarged left ventricle (ex-vacuo enlargement) as well as the third ventricle. Notably CT scan shows  no evidence of hydrocephalus and does show significant generalized atrophy.  I spoke with Dr. Varney Biles (EDP) and Dr. Melba Coon, the admitting neurologist, as well as with the patient's husband who was at the patient's bedside. I do not feel there is a role for neurosurgical intervention at this time. There is no hydrocephalus, and there is no indication for placement of a intraventricular catheter. Further with the event having probably occurred 3 days ago, it is unlikely that neurosurgical intervention is going to become necessary. I favor continued stroke care by the stroke neurology service, and subsequent continued neurosurgery and radiation oncology follow-up with her treating physicians at [facility]. I will continue to follow her from a neurosurgical perspective through the initial phase of this hospitalization.  Hosie Spangle, MD 06/15/2016, 6:19 PM

## 2016-06-15 NOTE — ED Triage Notes (Signed)
Pt here for AMS; pt alert to person only at present; pt unable to answer questions other than feeling weak x days and denies; pt noted to have old craniotomy scar

## 2016-06-15 NOTE — ED Notes (Signed)
Patient transported to CT 

## 2016-06-15 NOTE — ED Provider Notes (Signed)
ARTERIAL LINE Performed by: Nathaniel Man Consent: The procedure was performed in an emergent situation. Required items: required blood products, implants, devices, and special equipment available Patient identity confirmed: arm band and provided demographic data Time out: Immediately prior to procedure a "time out" was called to verify the correct patient, procedure, equipment, support staff and site/side marked as required. Indications: close blood pressure monitoring Anesthesia: local infiltration Local anesthetic: lidocaine 1% without epinephrine Anesthetic total: 3 ml Patient sedated: no Preparation: skin prepped with 2% chlorhexidine Skin prep agent dried: skin prep agent completely dried prior to procedure Sterile barriers: all five maximum sterile barriers used - cap, mask, sterile gown, sterile gloves, and large sterile sheet Hand hygiene: hand hygiene performed prior to central venous catheter insertion  Location details: right radial  Pre-procedure: landmarks identified Ultrasound guidance: yes Successful placement: yes Post-procedure: line sutured and dressing applied Assessment: blood return through all parts Patient tolerance: Patient tolerated the procedure well with no immediate complications.     Nathaniel Man, MD 06/15/16 2055    Noemi Chapel, MD 06/17/16 1224

## 2016-06-15 NOTE — ED Notes (Signed)
Pt returns from ct  

## 2016-06-15 NOTE — ED Notes (Signed)
Dr. Lenoria Chime at bedside to perform art line.

## 2016-06-16 ENCOUNTER — Telehealth: Payer: Self-pay | Admitting: Family Medicine

## 2016-06-16 ENCOUNTER — Inpatient Hospital Stay (HOSPITAL_COMMUNITY): Payer: 59

## 2016-06-16 DIAGNOSIS — Z7189 Other specified counseling: Secondary | ICD-10-CM

## 2016-06-16 DIAGNOSIS — I619 Nontraumatic intracerebral hemorrhage, unspecified: Secondary | ICD-10-CM | POA: Diagnosis present

## 2016-06-16 DIAGNOSIS — I161 Hypertensive emergency: Secondary | ICD-10-CM | POA: Diagnosis present

## 2016-06-16 DIAGNOSIS — Z515 Encounter for palliative care: Secondary | ICD-10-CM

## 2016-06-16 LAB — RAPID URINE DRUG SCREEN, HOSP PERFORMED
Amphetamines: NOT DETECTED
BARBITURATES: NOT DETECTED
Benzodiazepines: NOT DETECTED
COCAINE: NOT DETECTED
OPIATES: NOT DETECTED
TETRAHYDROCANNABINOL: NOT DETECTED

## 2016-06-16 LAB — TSH: TSH: 0.017 u[IU]/mL — ABNORMAL LOW (ref 0.350–4.500)

## 2016-06-16 LAB — COMPREHENSIVE METABOLIC PANEL
ALBUMIN: 3.3 g/dL — AB (ref 3.5–5.0)
ALK PHOS: 54 U/L (ref 38–126)
ALT: 26 U/L (ref 14–54)
ANION GAP: 8 (ref 5–15)
AST: 36 U/L (ref 15–41)
BILIRUBIN TOTAL: 0.3 mg/dL (ref 0.3–1.2)
BUN: 13 mg/dL (ref 6–20)
CALCIUM: 8.4 mg/dL — AB (ref 8.9–10.3)
CO2: 26 mmol/L (ref 22–32)
Chloride: 110 mmol/L (ref 101–111)
Creatinine, Ser: 0.97 mg/dL (ref 0.44–1.00)
GFR calc Af Amer: >60 mL/min (ref >60)
GFR calc non Af Amer: >60 mL/min (ref >60)
GLUCOSE: 133 mg/dL — AB (ref 65–99)
Potassium: 3.7 mmol/L (ref 3.5–5.1)
SODIUM: 144 mmol/L (ref 135–145)
TOTAL PROTEIN: 6.1 g/dL — AB (ref 6.5–8.1)

## 2016-06-16 LAB — GLUCOSE, CAPILLARY
GLUCOSE-CAPILLARY: 111 mg/dL — AB (ref 65–99)
GLUCOSE-CAPILLARY: 137 mg/dL — AB (ref 65–99)
Glucose-Capillary: 143 mg/dL — ABNORMAL HIGH (ref 65–99)

## 2016-06-16 LAB — CBC
HEMATOCRIT: 36.3 % (ref 36.0–46.0)
HEMOGLOBIN: 10.4 g/dL — AB (ref 12.0–15.0)
MCH: 24.1 pg — ABNORMAL LOW (ref 26.0–34.0)
MCHC: 28.7 g/dL — AB (ref 30.0–36.0)
MCV: 84 fL (ref 78.0–100.0)
Platelets: 372 10*3/uL (ref 150–400)
RBC: 4.32 MIL/uL (ref 3.87–5.11)
RDW: 21 % — AB (ref 11.5–15.5)
WBC: 17.3 10*3/uL — ABNORMAL HIGH (ref 4.0–10.5)

## 2016-06-16 LAB — HIV ANTIBODY (ROUTINE TESTING W REFLEX): HIV Screen 4th Generation wRfx: NONREACTIVE

## 2016-06-16 LAB — MRSA PCR SCREENING: MRSA by PCR: NEGATIVE

## 2016-06-16 MED ORDER — MORPHINE SULFATE (PF) 4 MG/ML IV SOLN: 1.0000 mg | INTRAVENOUS | Status: DC | PRN

## 2016-06-16 MED ORDER — MORPHINE SULFATE (CONCENTRATE) 10 MG/0.5ML PO SOLN
10.0000 mg | ORAL | Status: DC
  Administered 2016-06-16 (×3): 10 mg via SUBLINGUAL
  Filled 2016-06-16 (×3): qty 0.5

## 2016-06-16 MED ORDER — HALOPERIDOL 0.5 MG PO TABS
0.5000 mg | ORAL_TABLET | ORAL | Status: DC | PRN
  Filled 2016-06-16: qty 1

## 2016-06-16 MED ORDER — MORPHINE SULFATE (PF) 4 MG/ML IV SOLN: 4.0000 mg | INTRAVENOUS | Status: DC | PRN

## 2016-06-16 MED ORDER — MORPHINE SULFATE (CONCENTRATE) 10 MG/0.5ML PO SOLN: 5.0000 mg | ORAL | 0 refills | Status: AC

## 2016-06-16 MED ORDER — HALOPERIDOL LACTATE 5 MG/ML IJ SOLN: 0.5000 mg | INTRAMUSCULAR | Status: DC | PRN

## 2016-06-16 MED ORDER — DIPHENHYDRAMINE HCL 50 MG/ML IJ SOLN: 12.5000 mg | INTRAMUSCULAR | Status: DC | PRN

## 2016-06-16 MED ORDER — MORPHINE SULFATE (CONCENTRATE) 10 MG/0.5ML PO SOLN
5.0000 mg | ORAL | Status: DC
Start: 2016-06-16 — End: 2016-06-16
  Administered 2016-06-16: 5 mg via SUBLINGUAL
  Filled 2016-06-16 (×2): qty 0.5

## 2016-06-16 MED ORDER — HALOPERIDOL LACTATE 2 MG/ML PO CONC
0.5000 mg | ORAL | Status: DC | PRN
  Filled 2016-06-16: qty 0.3

## 2016-06-16 MED ORDER — HALOPERIDOL LACTATE 2 MG/ML PO CONC: 0.6000 mg | ORAL | 0 refills | Status: AC | PRN

## 2016-06-16 MED ORDER — LABETALOL HCL 5 MG/ML IV SOLN
10.0000 mg | INTRAVENOUS | Status: DC | PRN
  Administered 2016-06-16: 10 mg via INTRAVENOUS
  Filled 2016-06-16: qty 4

## 2016-06-16 NOTE — Progress Notes (Signed)
PT Cancellation Note  Patient Details Name: [name] MRN: [id number] DOB: [birth year]   Cancelled Treatment:    Reason Eval/Treat Not Completed: Patient not medically ready.  Patient remains on bedrest per orders.  **MD:  Please write activity orders when appropriate for patient.  Thank you.!   [name] 06/16/2016, 11:40 AM Carita Pian. Sanjuana Kava, [place] Pager [phone]

## 2016-06-16 NOTE — Discharge Summary (Signed)
Stroke Discharge Summary  Patient ID: [name]   MRN: [id number]      DOB: 06-Dec-1955  Date of Admission: 06/15/2016 Date of Discharge: 06/16/2016  Attending Physician:  Garvin Fila, MD, Stroke MD Consultant(s):   Jovita Gamma, MD (Neurosurgery), Mariana Kaufman, NP (Palliative) Patient's PCP:  Midge Minium, MD  DISCHARGE DIAGNOSIS:   Principal Problem:   Acute intracerebral hemorrhage (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  Past Medical History:  Diagnosis Date  . Addison's disease ([place])   . Anemia 03/06/2013  . Bladder spasm   . Brain cancer ([place])    Frontal lobe, 1993 and 2005  . Fibromyalgia   . GERD (gastroesophageal reflux disease)   . Hyperchloremia   . Hypertension   . 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 Migraines   . Morton neuroma   . Right fibular fracture 07/12/2011  . 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 Seizures ([place])   . Stroke [facility])    Past Surgical History:  Procedure Laterality Date  . APPENDECTOMY  1976  . brain cancer     1993 and 2005  . CHOLECYSTECTOMY  08/02/2013   Procedure: LAPAROSCOPIC CHOLECYSTECTOMY;  Surgeon: Gwenyth Ober, MD;  Location: [facility] OR;  Service: General;;  . ESOPHAGOGASTRODUODENOSCOPY N/A 12/28/2013   Procedure: ESOPHAGOGASTRODUODENOSCOPY (EGD);  Surgeon: Inda Castle, MD;  Location: [place];  Service: Endoscopy;  Laterality: N/A;  . EXCISION MORTON'S NEUROMA     right foot  . ORIF ANKLE FRACTURE  07/12/2011   Procedure: OPEN REDUCTION INTERNAL FIXATION (ORIF) ANKLE  FRACTURE;  Surgeon: Johnny Bridge, MD;  Location: [place];  Service: Orthopedics;  Laterality: Right;  . SAVORY DILATION N/A 12/28/2013   Procedure: SAVORY DILATION;  Surgeon: Inda Castle, MD;  Location: [place];  Service: Endoscopy;  Laterality: N/A;    Allergies as of 06/16/2016      Reactions   Versed [midazolam] Other (See Comments), Anaphylaxis   Severe respiratory depression, had to be masked in preop holding.   Ciprofloxacin Nausea And Vomiting   Cyclobenzaprine Itching   Methocarbamol Itching      Metronidazole Other (See Comments)   Unknown reaction   Penicillins Rash   Has patient had a PCN reaction causing immediate rash, facial/tongue/throat swelling, SOB or lightheadedness with hypotension: Yes Has patient had a PCN reaction causing severe rash involving mucus membranes or skin necrosis: No Has patient had a PCN reaction that required hospitalization: No Has patient had a PCN reaction occurring within the last 10 years: No If all of the above answers are "NO", then may proceed with Cephalosporin use.      Medication List    STOP taking these medications   aspirin 81 MG EC tablet   aspirin-acetaminophen-caffeine 250-250-65 MG tablet Commonly known as:  EXCEDRIN MIGRAINE   DULoxetine 60 MG capsule Commonly known as:  CYMBALTA   fenofibrate 160 MG tablet   ferrous sulfate 325 (65 FE) MG tablet   hydrocortisone 20 MG  tablet Commonly known as:  CORTEF   KEPPRA 500 MG tablet Generic drug:  levETIRAcetam   levothyroxine 88 MCG tablet Commonly known as:  SYNTHROID, LEVOTHROID   methocarbamol 500 MG tablet Commonly known as:  ROBAXIN   metoprolol tartrate 25 MG tablet Commonly known as:  LOPRESSOR   nystatin powder Commonly known as:  MYCOSTATIN/NYSTOP   omeprazole 20 MG capsule Commonly known as:  PRILOSEC   OVER THE COUNTER MEDICATION   pregabalin 150 MG capsule Commonly known as:  LYRICA   promethazine 25 MG tablet Commonly known as:   PHENERGAN   rosuvastatin 10 MG tablet Commonly known as:  CRESTOR   traZODone 50 MG tablet Commonly known as:  DESYREL   VITAMIN C PO   Vitamin D (Ergocalciferol) 50000 units Caps capsule Commonly known as:  DRISDOL     TAKE these medications   haloperidol 2 MG/ML solution Commonly known as:  HALDOL Place 0.3 mLs (0.6 mg total) under the tongue every 4 (four) hours as needed for agitation (or delirium).   morphine CONCENTRATE 10 MG/0.5ML Soln concentrated solution Place 0.25-0.5 mLs (5-10 mg total) under the tongue every 4 (four) hours.       LABORATORY STUDIES CBC    Component Value Date/Time   WBC 17.3 (H) 06/16/2016 0511   RBC 4.32 06/16/2016 0511   HGB 10.4 (L) 06/16/2016 0511   HGB 10.0 (L) 01/18/2016 1147   HCT 36.3 06/16/2016 0511   HCT 34.3 01/18/2016 1147   PLT 372 06/16/2016 0511   PLT 416 (H) 01/18/2016 1147   MCV 84.0 06/16/2016 0511   MCV 78 (L) 01/18/2016 1147   MCH 24.1 (L) 06/16/2016 0511   MCHC 28.7 (L) 06/16/2016 0511   RDW 21.0 (H) 06/16/2016 0511   RDW 17.2 (H) 01/18/2016 1147   LYMPHSABS 0.7 05/05/2016 1123   LYMPHSABS 1.2 01/18/2016 1147   MONOABS 0.3 05/05/2016 1123   EOSABS 0.1 05/05/2016 1123   EOSABS 0.1 01/18/2016 1147   BASOSABS 0.1 05/05/2016 1123   BASOSABS 0.0 01/18/2016 1147   CMP    Component Value Date/Time   NA 144 06/16/2016 0511   K 3.7 06/16/2016 0511   CL 110 06/16/2016 0511   CO2 26 06/16/2016 0511   GLUCOSE 133 (H) 06/16/2016 0511   GLUCOSE 93 04/17/2009 1510   BUN 13 06/16/2016 0511   CREATININE 0.97 06/16/2016 0511   CREATININE 0.76 03/03/2016 1626   CALCIUM 8.4 (L) 06/16/2016 0511   PROT 6.1 (L) 06/16/2016 0511   PROT 6.8 01/18/2016 1147   ALBUMIN 3.3 (L) 06/16/2016 0511   ALBUMIN 4.1 01/18/2016 1147   AST 36 06/16/2016 0511   ALT 26 06/16/2016 0511   ALKPHOS 54 06/16/2016 0511   BILITOT 0.3 06/16/2016 0511   BILITOT 0.2 01/18/2016 1147   GFRNONAA >60 06/16/2016 0511   GFRAA >60 06/16/2016 0511    COAGS Lab Results  Component Value Date   INR 1.11 06/15/2016   INR 1.24 11/27/2015   INR 1.18 11/27/2015   Lipid Panel    Component Value Date/Time   CHOL 150 03/03/2016 1626   CHOL 148 01/18/2016 1147   TRIG 170 (H) 03/03/2016 1626   HDL 46 (L) 03/03/2016 1626   HDL 68 01/18/2016 1147   CHOLHDL 3.3 03/03/2016 1626   VLDL 34 (H) 03/03/2016 1626   LDLCALC 70 03/03/2016 1626   LDLCALC 62 01/18/2016 1147   HgbA1C  Lab Results  Component Value Date   HGBA1C 6.5 05/06/2016   Urinalysis  Component Value Date/Time   COLORURINE YELLOW 06/15/2016 1547   APPEARANCEUR CLEAR 06/15/2016 1547   LABSPEC 1.032 (H) 06/15/2016 1547   PHURINE 5.0 06/15/2016 1547   GLUCOSEU NEGATIVE 06/15/2016 1547   HGBUR NEGATIVE 06/15/2016 1547   HGBUR negative 08/27/2009 1308   BILIRUBINUR NEGATIVE 06/15/2016 1547   BILIRUBINUR negative 02/06/2016 1326   KETONESUR NEGATIVE 06/15/2016 1547   PROTEINUR 100 (A) 06/15/2016 1547   UROBILINOGEN 0.2 02/06/2016 1326   UROBILINOGEN 1.0 11/05/2014 2240   NITRITE NEGATIVE 06/15/2016 1547   LEUKOCYTESUR NEGATIVE 06/15/2016 1547   Urine Drug Screen     Component Value Date/Time   LABOPIA NONE DETECTED 06/15/2016 1547   COCAINSCRNUR NONE DETECTED 06/15/2016 1547   LABBENZ NONE DETECTED 06/15/2016 1547   AMPHETMU NONE DETECTED 06/15/2016 1547   THCU NONE DETECTED 06/15/2016 1547   LABBARB NONE DETECTED 06/15/2016 1547    Alcohol Level    Component Value Date/Time   ETH <5 11/05/2014 2145     SIGNIFICANT DIAGNOSTIC STUDIES IMAGING  Ct Head Wo Contrast 06/17/2016  06/16/2016  Interpretation differs from yesterday's study. I think there is a 2.3 x 2.0 x 2.2 cm intraparenchymal/periparenchymal hematoma at the posterior margin of the left frontal resection/ anterior margin of the residual left frontal brain, with perforation into the roof of the anterior body of the left lateral ventricle, filling the left lateral ventricle with some extension  into the other ventricles. No evidence of increased hemorrhage since yesterday.  06/15/2016 1. Massive intraparenchymal hematoma, extending throughout much of the left hemisphere, but probably originating in the left basal ganglia. The appearance is more suggestive of hypertensive hemorrhage rather than hemorrhagic mass. 3 mm rightward midline shift. 2. Extension of blood into the extra-axial space, within the left frontal lobe resection cavity and left lateral and third ventricles. There is mild dilatation of the left lateral ventricle temporal horn, suggesting early ventricular trapping. 3. Left frontal lobe resection site containing a portion of extra-axial hemorrhage extension. No mass lesion visisble.   Ct Angio Chest Pe W/cm &/or Wo Cm Ct Abdomen Pelvis W Contrast 06/15/2016 1.  No evidence of pulmonary embolism. 2. Otherwise, low sensitivity evaluation of the chest secondary to multiple factors as detailed above. 3.  No acute process in the abdomen or pelvis. 4. Right nephrolithiasis. 5. Hepatic steatosis and hepatomegaly. 6. Cardiomegaly. 7. Mild T4 and T8 compression deformities, new since 11/28/2015. 8.  Aortic atherosclerosis.   Dg Chest Port 1 View 06/15/2016 Right base atelectasis. No edema or consolidation. Stable cardiac silhouette.    HISTORY OF PRESENT ILLNESS This is a 61-yo woman with h/o brain tumor resection x2, brain radiation, epilepsy, and secondary parkinsonism who is now brought to the ED by her husband for evaluation of confusion and decline in function over the past three days. He states that she began having difficulty getting her words out about three days ago and this has steadily worsened since. He reports that she has had intermittent problems with language for years because of her prior surgeries but it usually comes and goes. When she arrived at the ED, there is a initial concern this may be having a PE or perhaps sepsis. Eventually, CT scan of the head was obtained  and showed a large cerebral hemorrhage. Neurology is now asked to admit the patient for further management.  She has a complex neurological history for which she has been followed at [facility]. 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 This was followed by a full course of radiation therapy. According to her husband, she initially didn't have any significant neurologic deficits following surgery and radiation. She was maintained on Keppra with good control of seizures, possibly one break through seizure each year.   Beginning in November 2012, she began having some decline in her motor skills and cognition. Cognitively, she has poor short-term memory and a short attention span. She's been documented to have impairments in executive function and language. She has a flat affect and developed some compulsive behaviors following her brain surgeries. She also developed difficulty walking following a gallbladder surgery in July 2015 requiring use of a wheelchair. She was seen in the movement disorders clinic at [facility] where she was evaluated for normal pressure hydrocephalus with a large volume lumbar puncture before ultimately being diagnosed with secondary parkinsonism. She also carries a diagnosis of Addison's disease for which she takes hydrocortisone 20 mg each morning and 10 mg each evening.  She was admitted to the neuro ICU for further evaluation and treatment.  HOSPITAL COURSE Ms. [place]OT[place] DOST is a 62 y.o. female with history of brain tumor resection x2, brain radiation, epilepsy, and secondary parkinsonism presenting with confusion and decline in function x 3 d. CT showed large L cerebral hemorrhage which was thought not to be survivable.  Attending discussed likely outcome with husband of patient, who agreed to discharge the patient home for  hospice.   Stroke:  L lateral frontal IPH with IVH, hemorrhage in setting of previous low grade glimoa surgery x 2 with XRT with functional decline the past 5 years  Overall status poor, husband leaning toward comfort care. Agreeable to DNR.  Resultant  global aphasia with right hemiplegia   CT head L frontal IPH with L IVH, no increase in hmg over night  aspirin 81 mg daily prior to admission  Palliative consulted, referred to hospice  Will discharge home with PRN analgesics for symptom management, as well as home O2  Disposition:  home  Hypertensive Emergency  BP as high as 172/101 on admission  Treated with cardene in the hospital  Hyperlipidemia  Home meds:  crestor 10, not resumed  Other Stroke Risk Factors  Morbid Obesity, Body mass index is 42.74 kg/m   Hx stroke/TIA  Migraines  Other Active Problems  Baseline memory deficits  Parkinson's, uses w/c,   Addision's  Hypothyroidism  Seizure   DISCHARGE EXAM Blood pressure 117/61, pulse (!) 101, temperature 99.2 F (37.3 C), temperature source Oral, resp. rate 19, height 5\' 2"  (1.575 m), weight 106 kg (233 lb 11 oz), SpO2 97 %. Middle-aged Caucasian lady currently not in distress.  . Afebrile. Head is nontraumatic. Neck is supple without bruit.    Cardiac exam no murmur or gallop. Lungs are clear to auscultation. Distal pulses are well felt. Neurological Exam ;  Patient is drowsy but can be aroused. Pupils irregular but reactive. Fundi were not visualized. She is globally aphasic. She mumbles a few words but is difficult to understand. She cannot speak sentences. She can follow only occasional midline and one-step commands on the left side not consistently. She has left gaze preference. She blinks to threat on the left but not the right. She has right lower facial weakness. Got cough and gag are weak. She has dense right hemiplegia but will withdraw lower extremities more than a placement is to  painful  stimuli. She has purposeful antigravity movements on the left side. Right plantar is upgoing left is downgoing. Gait was not tested.  Discharge Diet   Diet NPO  DISCHARGE PLAN  Disposition: home with hospice  35 minutes were spent preparing discharge.  Charm Rings, NP I have personally examined this patient, reviewed notes, independently viewed imaging studies, participated in medical decision making and plan of care.ROS completed by me personally and pertinent positives fully documented  I have made any additions or clarifications directly to the above note. Agree with note above.   Antony Contras, MD Medical Director [facility] Stroke Center Pager: [phone] 06/16/2016 6:20 PM

## 2016-06-16 NOTE — Progress Notes (Signed)
STROKE TEAM PROGRESS NOTE   HISTORY OF PRESENT ILLNESS (per record) This is a 61-yo woman with h/o brain tumor resection x2, brain radiation, epilepsy, and secondary parkinsonism who is now brought to the ED by her husband for evaluation of confusion and decline in function over the past three days. He states that she began having difficulty getting her words out about three days ago and this has steadily worsened since. He reports that she has had intermittent problems with language for years because of her prior surgeries but it usually comes and goes.  When she arrived at the ED, there is a initial concern this may be having a PE or perhaps sepsis. Eventually, CT scan of the head was obtained and showed a large cerebral hemorrhage. Neurology is now asked to admit the patient for further management.  She has a complex neurological history for which she has been followed at [facility]. 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 This was followed by a full course of radiation therapy. According to her husband, she initially didn't have any significant neurologic deficits following surgery and radiation. She was maintained on Keppra with good control of seizures, possibly one break through seizure each year.   Beginning in November 2012, she began having some decline in her motor skills and cognition. Cognitively, she has poor short-term memory and a short attention span. She's been documented to have impairments in executive function and language. She has a flat affect and developed some compulsive behaviors following her brain surgeries. She also developed difficulty walking following a gallbladder surgery in July 2015 requiring use of a wheelchair. She was seen in the movement disorders clinic at  [facility] where she was evaluated for normal pressure  hydrocephalus with a large volume lumbar puncture before ultimately being diagnosed with secondary parkinsonism. She also carries a diagnosis of Addison's disease for which she takes hydrocortisone 20 mg each morning and 10 mg each evening.  She was admitted to the neuro ICU for further evaluation and treatment.   SUBJECTIVE (INTERVAL HISTORY) I had a long discussion with the patient's husband and granddaughter at the bedside and reviewed her history and presentation. 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 She has residual mild aphasia and cognitive issues at baseline requiring help with activities of daily living and even transfers out of bed. She had a repeat CT scan this morning which does not show significant change in hemorrhage but the primary hemorrhage appears to be at the posterior margin of the surgical resection site with extension into the body of the lateral ventricle. Neurosurgery has seen the patient and feels she is not a candidate for ventriculostomy or surgical evacuation of the hemorrhage  OBJECTIVE Temp:  [97.5 F (36.4 C)-98.4 F (36.9 C)] 97.6 F (36.4 C) (06/11 0839) Pulse Rate:  [74-118] 93 (06/11 0800) Cardiac Rhythm: Normal sinus rhythm (06/11 0800) Resp:  [16-34] 17 (06/11 0800) BP: (100-181)/(38-119) 119/60 (06/11 0800) SpO2:  [88 %-100 %] 98 % (06/11 0800) Arterial Line BP: (118-193)/(63-154) 139/72 (06/11 0700) Weight:  [104.3 kg (230 lb)-106 kg (233 lb 11 oz)] 106 kg (233 lb 11 oz) (06/10 2100)  CBC:  Recent Labs Lab 06/15/16 1357 06/16/16 0511  WBC 16.5* 17.3*  HGB 12.3  10.4*  HCT 42.3 36.3  MCV 82.0 84.0  PLT 393 093    Basic Metabolic Panel:  Recent Labs Lab 06/15/16 1357 06/16/16 0511  NA 142 144  K 3.3* 3.7  CL 108 110  CO2 24 26  GLUCOSE 172* 133*  BUN 13 13  CREATININE 0.82 0.97  CALCIUM 9.7 8.4*    Lipid Panel:     Component Value Date/Time   CHOL 150 03/03/2016 1626   CHOL 148 01/18/2016 1147   TRIG 170 (H) 03/03/2016 1626   HDL 46 (L) 03/03/2016 1626   HDL 68 01/18/2016 1147   CHOLHDL 3.3 03/03/2016 1626   VLDL 34 (H) 03/03/2016 1626   LDLCALC 70 03/03/2016 1626   LDLCALC 62 01/18/2016 1147   HgbA1c:  Lab Results  Component Value Date   HGBA1C 6.5 05/06/2016   Urine Drug Screen:    Component Value Date/Time   LABOPIA NONE DETECTED 11/05/2014 2240   COCAINSCRNUR NONE DETECTED 11/05/2014 2240   LABBENZ NONE DETECTED 11/05/2014 2240   AMPHETMU NONE DETECTED 11/05/2014 2240   THCU NONE DETECTED 11/05/2014 2240   LABBARB NONE DETECTED 11/05/2014 2240    Alcohol Level     Component Value Date/Time   ETH <5 11/05/2014 2145    IMAGING  Ct Head Wo Contrast 06/17/2016  06/16/2016  Interpretation differs from yesterday's study. I think there is a 2.3 x 2.0 x 2.2 cm intraparenchymal/periparenchymal hematoma at the posterior margin of the left frontal resection/ anterior margin of the residual left frontal brain, with perforation into the roof of the anterior body of the left lateral ventricle, filling the left lateral ventricle with some extension into the other ventricles. No evidence of increased hemorrhage since yesterday.  06/15/2016 1. Massive intraparenchymal hematoma, extending throughout much of the left hemisphere, but probably originating in the left basal ganglia. The appearance is more suggestive of hypertensive hemorrhage rather than hemorrhagic mass. 3 mm rightward midline shift. 2. Extension of blood into the extra-axial space, within the left frontal lobe resection cavity and left lateral and third ventricles. There is mild dilatation of the left lateral ventricle temporal horn, suggesting early ventricular trapping. 3. Left frontal lobe resection site containing a portion of extra-axial hemorrhage extension. No mass lesion visisble.   Ct Angio Chest Pe W/cm &/or Wo Cm Ct Abdomen  Pelvis W Contrast 06/15/2016 1.  No evidence of pulmonary embolism. 2. Otherwise, low sensitivity evaluation of the chest secondary to multiple factors as detailed above. 3.  No acute process in the abdomen or pelvis. 4. Right nephrolithiasis. 5. Hepatic steatosis and hepatomegaly. 6. Cardiomegaly. 7. Mild T4 and T8 compression deformities, new since 11/28/2015. 8.  Aortic atherosclerosis.   Dg Chest Port 1 View 06/15/2016 Right base atelectasis. No edema or consolidation. Stable cardiac silhouette.    PHYSICAL EXAM Middle-aged Caucasian lady currently not in distress.  . Afebrile. Head is nontraumatic. Neck is supple without bruit.    Cardiac exam no murmur or gallop. Lungs are clear to auscultation. Distal pulses are well felt. Neurological Exam ;  Patient is drowsy but can be aroused. Pupils irregular but reactive. Fundi were not visualized. She is globally aphasic. She mumbles a few words but is difficult to understand. She cannot speak sentences. She can follow only occasional midline and one-step commands on the left side not consistently. She has left gaze preference. She blinks to threat on the left but not the right. She has right lower facial weakness. Got cough and gag are weak. She  has dense right hemiplegia but will withdraw lower extremities more than a placement is to painful stimuli. She has purposeful antigravity movements on the left side. Right plantar is upgoing left is downgoing. Gait was not tested. ASSESSMENT/PLAN Ms. JAKARIA LAVERGNE is a 61 y.o. female with history of brain tumor resection x2, brain radiation, epilepsy, and secondary parkinsonism presenting with confusion and decline in function x 3 d. CT showed large L cerebral hemorrhage.   Stroke:  L lateral frontal IPH with IVH, hemorrhage in setting of previous low grade glimoa surgery x 2 with XRT with functional decline the past 5 years  Overall status poor, husband leaning toward comfort care. Agreeable to  DNR.  Resultant  global aphasia with right hemiplegia   CT head L frontal IPH with L IVH, no increase in hmg over night  SCDs for VTE prophylaxis  Diet NPO time specified  aspirin 81 mg daily prior to admission  Palliative consult placed  Therapy recommendations:  pending   Disposition:  pending   Hypertensive Emergency  BP as high as 172/101 on admission  Started on cardene drip in ED  cardene currently at max  BP at goal < 140  Hyperlipidemia  Home meds:  crestor 10, not resumed in hospital  Other Stroke Risk Factors  Morbid Obesity, Body mass index is 42.74 kg/m., recommend weight loss, diet and exercise as appropriate   Hx stroke/TIA  Migraines  Other Active Problems  Baseline memory deficits  Parkinson's, uses w/c,   addision's on hydrocortisone   Hypothyroidism on levothyroxine   Seizure d/o on [facility] day # 1  I have personally examined this patient, reviewed notes, independently viewed imaging studies, participated in medical decision making and plan of care.ROS completed by me personally and pertinent positives fully documented  I have made any additions or clarifications directly to the above note. Patient has presented with aphasia with right hemiparesis due to large left subcortical hemorrhage in the tumor resection cavity site with intraventricular extension. Her prognosis is quite poor given her poor baseline functioning and family was clearly struggling to care for her as it is and now with new deficits is going to be quite impractical to provide the care she needs at home. Family does not wish for her to go to nursing home hence I feel palliative care consult and comfort care consideration would be appropriate. Discussed with critical care team. This patient is critically ill and at significant risk of neurological worsening, death and care requires constant monitoring of vital signs, hemodynamics,respiratory and cardiac monitoring,  extensive review of multiple databases, frequent neurological assessment, discussion with family, other specialists and medical decision making of high complexity.I have made any additions or clarifications directly to the above note.This critical care time does not reflect procedure time, or teaching time or supervisory time of PA/NP/Med Resident etc but could involve care discussion time.  I spent 40 minutes of neurocritical care time  in the care of  this patient.      Antony Contras, MD Medical Director [facility] Pager: [phone] 06/16/2016 12:25 PM   To contact Stroke Continuity provider, please refer to [email or web address] After hours, contact General Neurology

## 2016-06-16 NOTE — Care Management Note (Addendum)
Case Management Note  Patient Details  Name: META KROENKE MRN: [id number] Date of Birth: [birth year]  Subjective/Objective:    Pt admitted with confusion and large cerebral hemmorrhage             Action/Plan:   PTA from home with husband - wheelchair bound.  Per palliative and family at bedside plan is for Pt to discharge home with hospice.  CM offered husband choice, chose [facility].  CM contacted agency and made referral.  Pt already has hospital bed in the home - only requesting bed pan.   Pt is also currently on oxygen so will likely need it at discharge.   Expected Discharge Date:                  Expected Discharge Plan:  Home w Hospice Care  In-House Referral:     Discharge planning Services  CM Consult  Post Acute Care Choice:    Choice offered to:     DME Arranged:    DME Agency:     HH Arranged:    HH Agency:     Status of Service:     If discussed at [employer], dates discussed:    Additional Comments: CM spoke with husband and bedside nurse late evening 6/11 - both confirmed that all equipment had been delivered to the home.  Husband still wanted pt to discharge 6/11 regardless of time.  [facility] aware and following  1525:  Eva with [facility] at bedside, pt has been accepted by agency.  Agency actively working on referral - sent equipment referral to [facility].  Bedside nurse aware and comfortable with setting up transport and providing transport package with [facility] (per Palliative NP) if pt is able to discharge home today, nurse also securing prescription meds so family can pick up prior to pt arriving home.  CM informed Palliative NP that barriers to discharge home today include equipment set up in the home and prescription pickup.  Husband and son aware of barriers and even though they prefer discharge today they have both communicated that they understand the barriers.   1443:  Unfortunately, pt was unable to tolerate RA -  Supplemental oxygen turned back on.  Amy with  [facility] notified of need .  Agency informed CM that agency will perform home with hospice assessment as soon as possible.  Bedside nurse to turn off supplemental oxygen to determine if pt needs oxygen in the home - this will need to be determined to ensure all needed equipment can be delivered to the home prior to pt arriving.  Pt will likely discharge home tomorrow with hospice pending late afternoon assessment by chosen agency.  CM informed family at bedside including son - left voicemail for husband requesting call back.  Pt will likely discharge home tomorrow with hospice.    Maryclare Labrador, RN 06/16/2016, 2:43 PM

## 2016-06-16 NOTE — Progress Notes (Signed)
OT Cancellation Note  Patient Details Name: [name] MRN: [id number] DOB: Aug 21, 1955   Cancelled Treatment:    Reason Eval/Treat Not Completed: Other (comment): Pt now on comfort care and OT order discontinued. OT will sign off. Thank you!  Norman Herrlich, MS OTR/L  Pager: [phone]   Norman Herrlich 06/16/2016, 2:11 PM

## 2016-06-16 NOTE — Consult Note (Signed)
Consultation Note Date: 06/16/2016   Patient Name: [name]  DOB: 10/16/1955  MRN: [id number]  Age / Sex: 61 y.o., female  PCP: Midge Minium, MD Referring Physician: Garvin Fila, MD  Reason for Consultation: Establishing goals of care and Terminal Care  HPI/Patient Profile: 61 y.o. female  with past medical history of brain tumor resection x 2, s/p radiation, epilepsy, secondary parkinsonism, Addison's disease, fibromyalgia, HTN, seizure, CVA  admitted on 06/15/2016 with altered mental status for three days. In ED patient was tachycardic, hypoxic and with labored breathing. Workup reveals ICH. Not a surgical candidate.    Clinical Assessment and Goals of Care: Met with patient's family. Introduced palliative medicine. Palliative medicine is specialized medical care for people living with serious illness. It focuses on providing relief from the symptoms and stress of a serious illness. The goal is to improve quality of life for both the patient and the family. Patient was living at home prior to admission. Has living will with clear goals- DNR, no feeding tube. Had poor level of functioning prior to admission. Spouse was assisting with ADL's including toileting- pt was incontinent of stool and urine. She was no longer walking.  Family is clear in that their [place] for patient is to not be in pain or suffer. They understand that patient is at end of life. They express desire for no further aggressive intervention. Discussed comfort care and hospice philosophy as providing interventions for symptom management only, discontinuing interventions with GOC of prolonging life- including stopping IV fluids and medications. They would like to attempt to take patient home with Hospice support. Discussed services provided by Hospice and Hospice philosophy. Described personal care by family that will be needed. They note  they have support available to care for patient and are prepared for patient to die at home.   Primary Decision Maker NEXT OF KIN - Patient's spouse - [name]    SUMMARY OF RECOMMENDATIONS -DNR -Transition to full comfort care -Schedule morphine intensol '10mg'$  SL q4hr -prn '4mg'$  IV q1hr pain, agitation, sob -NO ATIVAN- d/t anaphylaxis to Versed -Stop all medications not intended for comfort -Consult care management for Hospice in the home  Code Status/Advance Care Planning:  DNR    Symptom Management:   As above  Palliative Prophylaxis:   Delirium Protocol and Frequent Pain Assessment  Additional Recommendations (Limitations, Scope, Preferences):  Full Comfort Care and Minimize Medications   Prognosis:    < 2 weeks d/t transition to comfort measures, no po intake  Discharge Planning: Home with Hospice  Primary Diagnoses: Present on Admission: . ICH (intracerebral hemorrhage) ([place])   I have reviewed the medical record, interviewed the patient and family, and examined the patient. The following aspects are pertinent.  Past Medical History:  Diagnosis Date  . Addison's disease ([place])   . Anemia 03/06/2013  . Bladder spasm   . Brain cancer ([place])    Frontal lobe, 1993 and 2005  . Fibromyalgia   . GERD (gastroesophageal reflux disease)   .  Hyperchloremia   . Hypertension   . 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 Migraines   . Morton neuroma   . Right fibular fracture 07/12/2011  . Secondary Parkinson disease ([facility])   . Seizures ([facility])   . Stroke [facility])    Social History   Social History  . Marital status: Married    Spouse name: N/A  . Number of children: N/A  . Years of education: N/A   Social History Main Topics  . Smoking status: Never Smoker  . Smokeless tobacco: Never Used  . Alcohol use No  . Drug use: No  . Sexual activity: Not Currently   Other Topics Concern  .  None   Social History Narrative  . None   Family History  Problem Relation Age of Onset  . Diabetes Mother   . Hypertension Mother   . Hypertension Father   . Diabetes Brother    Scheduled Meds: .  stroke: mapping our early stages of recovery book   Does not apply Once  . hydrocortisone sod succinate (SOLU-CORTEF) inj  20 mg Intravenous QPM  . hydrocortisone sod succinate (SOLU-CORTEF) inj  40 mg Intravenous q morning - 10a  . insulin aspart  2-6 Units Subcutaneous Q4H  . levothyroxine  44 mcg Intravenous Daily  . pantoprazole (PROTONIX) IV  40 mg Intravenous QHS  . potassium chloride  20 mEq Oral Once  . senna-docusate  1 tablet Oral BID   Continuous Infusions: . sodium chloride    . levETIRAcetam 500 mg (06/16/16 1027)  . niCARDipine 15 mg/hr (06/16/16 0841)   PRN Meds:.Place/Maintain arterial line **AND** sodium chloride, acetaminophen **OR** acetaminophen (TYLENOL) oral liquid 160 mg/5 mL **OR** acetaminophen, labetalol, morphine injection Medications Prior to Admission:  Prior to Admission medications   Medication Sig Start Date End Date Taking? Authorizing Provider  Ascorbic Acid (VITAMIN C PO) Take 1 tablet by mouth daily.   Yes [provider]  aspirin EC 81 MG EC tablet Take 1 tablet (81 mg total) by mouth daily. 12/29/13  Yes Osvaldo Shipper, MD  aspirin-acetaminophen-caffeine [facility] MIGRAINE) [phone] MG per tablet Take 2 tablets by mouth every 6 (six) hours as needed for headache.    Yes [provider]  DULoxetine (CYMBALTA) 60 MG capsule TAKE 1 CAPSULE BY MOUTH  DAILY 06/05/16  Yes Sheliah Hatch, MD  fenofibrate 160 MG tablet TAKE 1 TABLET BY MOUTH  EVERY DAY 06/05/16  Yes Sheliah Hatch, MD  ferrous sulfate 325 (65 FE) MG tablet Take 1 tablet (325 mg total) by mouth daily with breakfast. 05/07/16  Yes Sheliah Hatch, MD  hydrocortisone (CORTEF) 20 MG tablet Take 1 tablet every morning and 1/2 tablet in the evening Patient taking  differently: Take 10-20 mg by mouth See admin instructions. Take 1 tablet 20 mg) by mouth every morning and 1/2 tablet (10 mg) at bedtime 04/28/16  Yes Reather Littler, MD  KEPPRA 500 MG tablet TAKE 3 TABLETS BY MOUTH TWO TIMES DAILY 06/05/16  Yes Sheliah Hatch, MD  levothyroxine (SYNTHROID, LEVOTHROID) 88 MCG tablet Take 1 tablet (88 mcg total) by mouth daily. 01/10/16  Yes Reather Littler, MD  methocarbamol (ROBAXIN) 500 MG tablet Take 1 tablet (500 mg total) by mouth 3 (three) times daily as needed for muscle spasms. Patient taking differently: Take 500 mg by mouth 3 (three) times daily as needed (back spasms).  06/11/16  Yes  Midge Minium, MD  omeprazole (PRILOSEC) 20 MG capsule TAKE 1 CAPSULE BY MOUTH  DAILY 06/05/16  Yes Midge Minium, MD  OVER THE COUNTER MEDICATION Place 1 drop into both eyes at bedtime. Over the counter lubricating eye drops   Yes [provider]  pregabalin (LYRICA) 150 MG capsule Take 1 capsule (150 mg total) by mouth 2 (two) times daily. 02/08/16  Yes Midge Minium, MD  promethazine (PHENERGAN) 25 MG tablet TAKE 1 TABLET BY MOUTH  EVERY 6 HOURS AS NEEDED FOR NAUSEA OR VOMITING. Patient taking differently: TAKE 1 TABLET BY MOUTH  EVERY 6 HOURS AS NEEDED FOR NAUSEA OR VOMITING FROM MIGRAINES 06/05/16  Yes Midge Minium, MD  rosuvastatin (CRESTOR) 10 MG tablet TAKE 1 TABLET BY MOUTH  EVERY MONDAY, [place],  FRIDAY 06/05/16  Yes Midge Minium, MD  traZODone (DESYREL) 50 MG tablet TAKE 1 TABLET BY MOUTH AT  BEDTIME 06/05/16  Yes Midge Minium, MD  hydrocortisone (CORTEF) 20 MG tablet TAKE 1 TABLET BY MOUTH EVERY MORNING AND1/2 TALBET IN THE EVENING Patient not taking: Reported on 06/15/2016 06/06/16   Elayne Snare, MD  metoprolol tartrate (LOPRESSOR) 25 MG tablet Take 0.5 tablets (12.5 mg total) by mouth 2 (two) times daily. Patient not taking: Reported on 06/15/2016 12/03/15   Orson Eva, MD  nystatin (MYCOSTATIN/NYSTOP) powder Apply topically 4  (four) times daily. Patient not taking: Reported on 06/15/2016 01/18/16   Midge Minium, MD  Vitamin D, Ergocalciferol, (DRISDOL) 50000 units CAPS capsule Take 1 capsule (50,000 Units total) by mouth every 7 (seven) days. Patient not taking: Reported on 06/15/2016 03/04/16   Midge Minium, MD   Allergies  Allergen Reactions  . Versed [Midazolam] Other (See Comments) and Anaphylaxis    Severe respiratory depression, had to be masked in preop holding.  . Ciprofloxacin Nausea And Vomiting  . Cyclobenzaprine Itching  . Methocarbamol Itching       . Metronidazole Other (See Comments)    Unknown reaction  . Penicillins Rash    Has patient had a PCN reaction causing immediate rash, facial/tongue/throat swelling, SOB or lightheadedness with hypotension: Yes Has patient had a PCN reaction causing severe rash involving mucus membranes or skin necrosis: No Has patient had a PCN reaction that required hospitalization: No Has patient had a PCN reaction occurring within the last 10 years: No If all of the above answers are "NO", then may proceed with Cephalosporin use.   Review of Systems  Unable to perform ROS   Physical Exam  HENT:  Craniotomy scar noted  Cardiovascular:  Tachycardic   Pulmonary/Chest: Effort normal.  Abdominal: Soft.  Neurological: She is alert.  Nonverbal, tracks slightly to voices, does not follow commands  Skin: Skin is warm and dry.  Psychiatric:  Flat affect  Nursing note and vitals reviewed.   Vital Signs: BP 117/61   Pulse (!) 101   Temp 97.6 F (36.4 C) (Oral)   Resp 19   Ht '5\' 2"'$  (1.575 m)   Wt 106 kg (233 lb 11 oz)   SpO2 97%   BMI 42.74 kg/m  Pain Assessment: PAINAD       SpO2: SpO2: 97 % O2 Device:SpO2: 97 % O2 Flow Rate: .O2 Flow Rate (L/min): 2 L/min  IO: Intake/output summary:  Intake/Output Summary (Last 24 hours) at 06/16/16 1150 Last data filed at 06/16/16 1001  Gross per 24 hour  Intake          2082.91 ml  Output  250 ml  Net          1832.91 ml    LBM:   Baseline Weight: Weight: 104.3 kg (230 lb) Most recent weight: Weight: 106 kg (233 lb 11 oz)     Palliative Assessment/Data: PPS: 10%   Flowsheet Rows     Most Recent Value  Intake Tab  Referral Department  Neurology  Unit at Time of Referral  ICU  Palliative Care Primary Diagnosis  Cancer  Date Notified  06/16/16  Palliative Care Type  New Palliative care  Reason for referral  Clarify Goals of Care  Date of Admission  06/15/16  # of days IP prior to Palliative referral  1  Clinical Assessment  Psychosocial & Spiritual Assessment  Palliative Care Outcomes      Thank you for this consult. Palliative medicine will continue to follow and assist as needed.   Time In: 1045  Time Out: 1215 Time Total: 90 minutes Greater than 50%  of this time was spent counseling and coordinating care related to the above assessment and plan.  Signed by: Mariana Kaufman, AGNP-C Palliative Medicine    Please contact Palliative Medicine Team phone at [phone] for questions and concerns.  For individual provider: See Shea Evans

## 2016-06-16 NOTE — Progress Notes (Signed)
Discharge orders received. Pt for discharge home today with hospice. IV's and telemetry D/C'd. Prescriptions and D/C instructions given to family with verbalized understanding. Family at bedside to assist with D/C. Pt to transfer home with [facility]. Fortino Sic, RN, BSN 06/16/2016 10:22 PM

## 2016-06-16 NOTE — Telephone Encounter (Signed)
Since I have not seen her recently, I would prefer Hospice to be the attending physician

## 2016-06-16 NOTE — Progress Notes (Signed)
[facility] - Liaison note  Received request from [place] for family interest in [facility] services at home after discharge. Chart and patient information under review to confirm hospice eligibility.   Met with patient's spouse, son, DIL and grands at bedside to confirm interest and explain services. Family verbalized good understanding and are hopeful for patient to return home today via [facility] or EMS, whichever hospital arranges. Family has [facility] contact information.   DME needs discussed with family. They are requesting oxygen setup which has been ordered through [facility]. Patient already has hospital bed and they prefer for her to remain in the bed she is familiar with. Address in [facility] has been verified as the correct address. Patient's son Catalina Antigua is contact for DME delivery. Family is working on getting scripts filled. [facility] was not able to fill initial script provided. RN is involved. Per family, DME is to be delivered around 8 pm.  [facility] referral center aware of above.   Please send signed and completed out of facility DNR with patient. Please send scripts for any medication patient does not already have.  Discharge summary has already been faxed to referral center.   [facility] will continue to follow until discharge.   Thank you,  Erling Conte, LCSW [phone]

## 2016-06-16 NOTE — Progress Notes (Signed)
OT Cancellation Note  Patient Details Name: [name] MRN: [id number] DOB: 08-20-1955   Cancelled Treatment:    Reason Eval/Treat Not Completed: Patient not medically ready. Pt with active bedrest orders. Will await increase in activity orders prior to initiating OT evaluation. Thank you for this referral!  Norman Herrlich, MS OTR/L  Pager: [place] [facility] [name] 06/16/2016, 7:19 AM

## 2016-06-16 NOTE — Progress Notes (Signed)
SLP Cancellation Note  Patient Details Name: [name] MRN: [id number] DOB: 1955-03-17   Cancelled treatment:       Reason Eval/Treat Not Completed: Patient not medically ready   Darlette Dubow, Katherene Ponto 06/16/2016, 9:45 AM

## 2016-06-16 NOTE — Telephone Encounter (Signed)
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 Pt has seen a neuro (Dr. Miachel Roux) and husband wants KT to recommend another neuro for a 2nd option for pt.

## 2016-06-16 NOTE — Telephone Encounter (Signed)
I have spoken with Amber and she is aware that Dr. Birdie Riddle prefers hospice take over.  She stated that she needed the verbal okay from [place] so that their doctors could take over and write orders.     Okay given.

## 2016-06-16 NOTE — Telephone Encounter (Signed)
Routed to Provider to advise.

## 2016-06-16 NOTE — Telephone Encounter (Signed)
Amber with [facility] calling to report patient is currently at [facility] and will be discharged within the day or so to hospice care.  She is requesting confirmation that Dr. Birdie Riddle will be attending physician for patient while under hospice care.

## 2016-06-16 NOTE — Telephone Encounter (Signed)
At time of d/c they should be able to provide pt w/ Neurology follow up.  If not, we will be happy to refer but they should tell their hospital team that they would like a local neuro

## 2016-06-16 NOTE — Telephone Encounter (Signed)
Patient husband notified of PCP recommendations and is agreement and expresses an understanding.

## 2016-06-17 NOTE — Care Management Note (Signed)
Case Management Note  Patient Details  Name: [name] MRN: [id number] Date of Birth: [birth year]  Subjective/Objective:    Pt admitted with confusion and large cerebral hemmorrhage             Action/Plan:   PTA from home with husband - wheelchair bound.  Per palliative and family at bedside plan is for Pt to discharge home with hospice.  CM offered husband choice, chose [facility].  CM contacted agency and made referral.  Pt already has hospital bed in the home - only requesting bed pan.   Pt is also currently on oxygen so will likely need it at discharge.   Expected Discharge Date:  06/16/16               Expected Discharge Plan:  Home w Hospice Care  In-House Referral:     Discharge planning Services  CM Consult  Post Acute Care Choice:    Choice offered to:     DME Arranged:    DME Agency:     HH Arranged:    HH Agency:     Status of Service:     If discussed at [employer], dates discussed:    Additional Comments: 06/17/2016 CM call and spoke with grand daughter - pt safely arrived in the home, no concerns voiced by family.  Family has already contacted [facility] this am as instructed by agency.  CM spoke with husband and bedside nurse late evening 6/11 - both confirmed that all equipment had been delivered to the home.  Husband still wanted pt to discharge 6/11 regardless of time.  [facility] aware and following  1525:  Eva with [facility] at bedside, pt has been accepted by agency.  Agency actively working on referral - sent equipment referral to [facility].  Bedside nurse aware and comfortable with setting up transport and providing transport package with [facility] (per Palliative NP) if pt is able to discharge home today, nurse also securing prescription meds so family can pick up prior to pt arriving home.  CM informed Palliative NP that barriers to discharge home today include equipment set up in the home and prescription pickup.  Husband and son aware of barriers and even though they  prefer discharge today they have both communicated that they understand the barriers.   1443:  Unfortunately, pt was unable to tolerate RA -  Supplemental oxygen turned back on.  Amy with [facility] notified of need .  Agency informed CM that agency will perform home with hospice assessment as soon as possible.  Bedside nurse to turn off supplemental oxygen to determine if pt needs oxygen in the home - this will need to be determined to ensure all needed equipment can be delivered to the home prior to pt arriving.  Pt will likely discharge home tomorrow with hospice pending late afternoon assessment by chosen agency.  CM informed family at bedside including son - left voicemail for husband requesting call back.  Pt will likely discharge home tomorrow with hospice.    Maryclare Labrador, RN 06/17/2016, 9:06 AM

## 2016-06-20 ENCOUNTER — Telehealth: Payer: Self-pay | Admitting: Family Medicine

## 2016-06-20 NOTE — Telephone Encounter (Signed)
Death certificate was dropped off for signature, gave it to V[place] for KT

## 2016-06-23 ENCOUNTER — Telehealth: Payer: Self-pay | Admitting: Endocrinology

## 2016-06-23 NOTE — Telephone Encounter (Signed)
Noted  

## 2016-06-23 NOTE — Telephone Encounter (Signed)
Wanted to send you this message. How unfortunate.

## 2016-06-23 NOTE — Telephone Encounter (Signed)
Patient's husband called and wanted to let Dr. Dwyane Dee know that his wife passed away on 2016/06/21 at 5:30 am of a massive stroke. He said that he knows that his wife really appreciated all of the care that Dr. Dwyane Dee gave her and wanted to let her know that.

## 2016-06-23 NOTE — Telephone Encounter (Signed)
Completed and placed in basket 

## 2016-06-24 NOTE — Telephone Encounter (Signed)
Called [facility], spoke with Judson Roch to advise death certificate has been signed and is ready for pick up.

## 2016-07-06 DEATH — deceased

## 2016-07-24 ENCOUNTER — Ambulatory Visit: Payer: 59 | Admitting: Endocrinology

## 2016-09-01 ENCOUNTER — Ambulatory Visit: Payer: 59 | Admitting: Family Medicine

## 2017-08-29 IMAGING — MR MR HEAD W/O CM
9 of 10 series · 35 of 48 positions shown · non-contrast
Comparison: 11/06/2015 MRI of the head.

CLINICAL DATA: 60 y/o F; history of Addison's disease, brain cancer
post resection and radiation, seizure disorder, hypothyroidism,
presenting to the emergency department with generalized weakness.

EXAM:
MRI HEAD WITHOUT CONTRAST
TECHNIQUE: Multiplanar, multiecho pulse sequences of the brain and surrounding
structures were obtained without intravenous contrast.

[Series 3: DWI · axial · 3.0mm · 1.09mm/px · z∈[-25,+116]mm · 9 of 98 slices shown (1 of 4)]
[im 1/98]
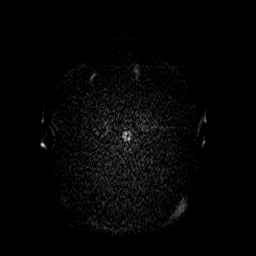
[im 13/98]
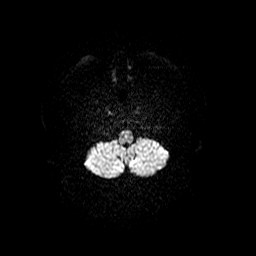
[im 25/98]
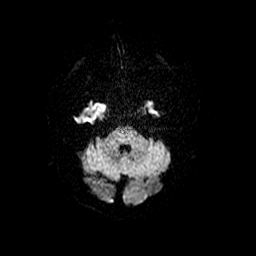
[im 37/98]
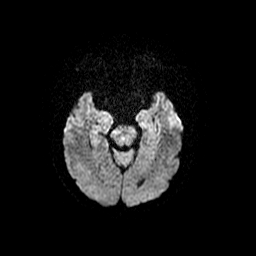
[im 49/98]
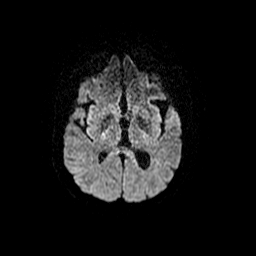
[im 61/98]
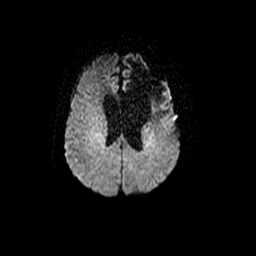
[im 73/98]
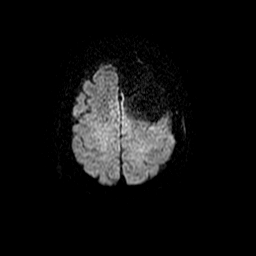
[im 85/98]
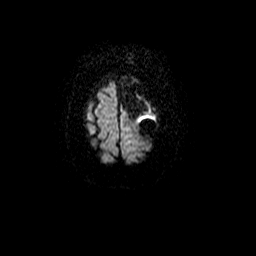
[im 98/98]
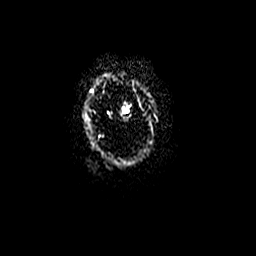

[Series 4: T1 · sagittal · 5.0mm · 0.47mm/px · 2 of 23 slices shown]
[im 1/23]
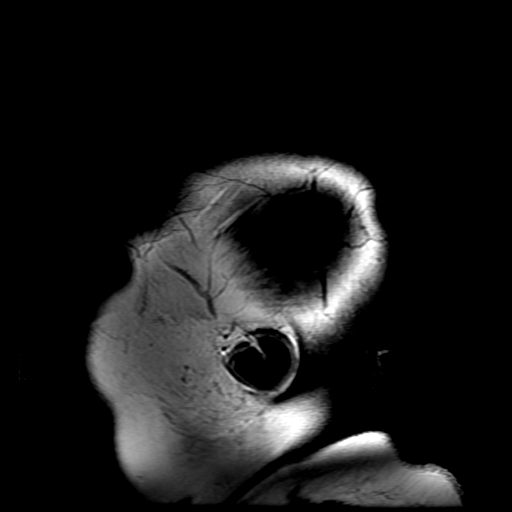
[im 23/23]
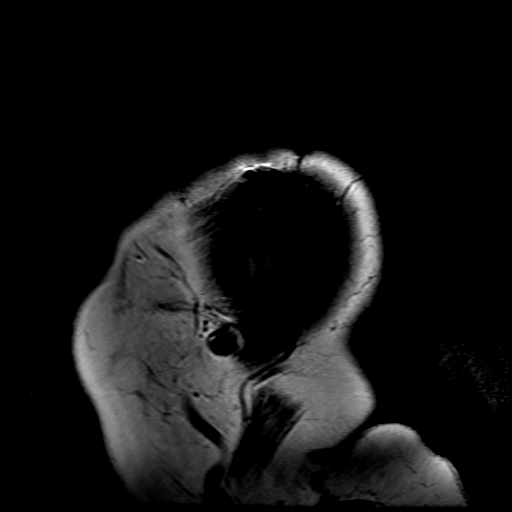

[Series 5: DWI · coronal · 5.0mm · 1.09mm/px · 6 of 66 slices shown (2 of 4)]
[im 1/66]
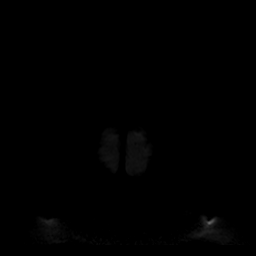
[im 14/66]
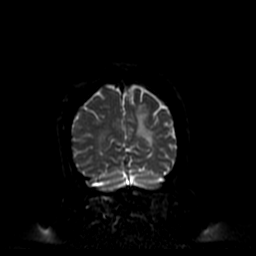
[im 27/66]
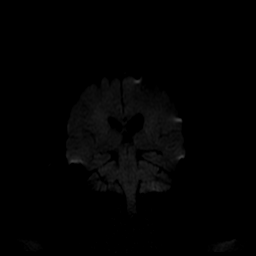
[im 40/66]
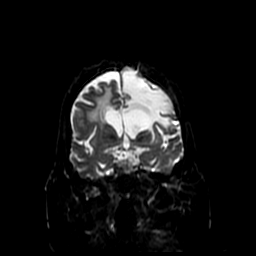
[im 53/66]
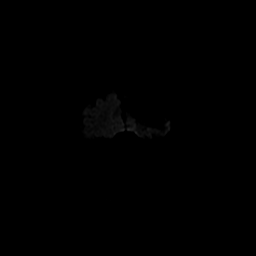
[im 66/66]
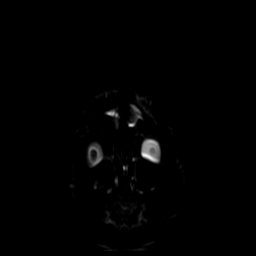

[Series 6: T2 · axial · 5.0mm · 0.43mm/px · z∈[-41,+113]mm · 3 of 27 slices shown (1 of 2)]
[im 1/27]
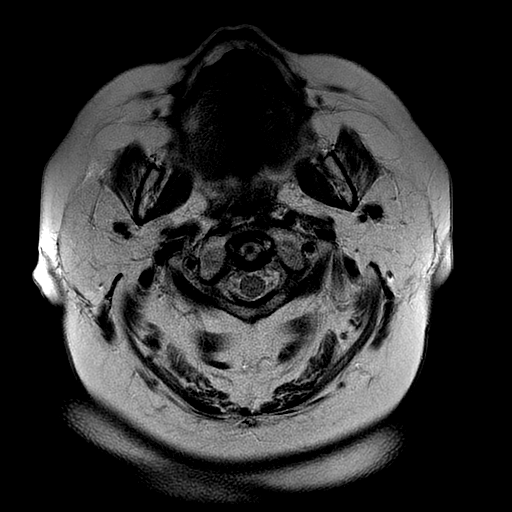
[im 14/27]
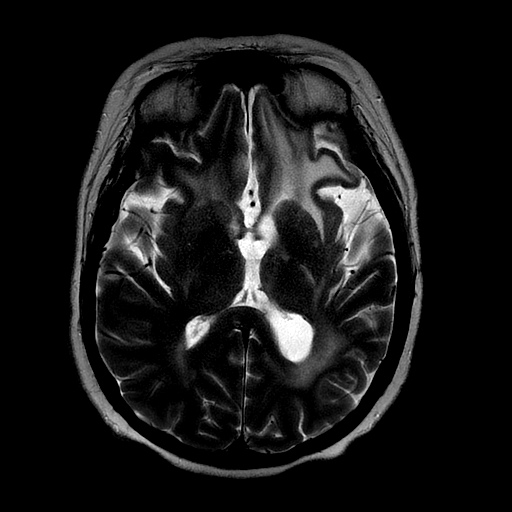
[im 27/27]
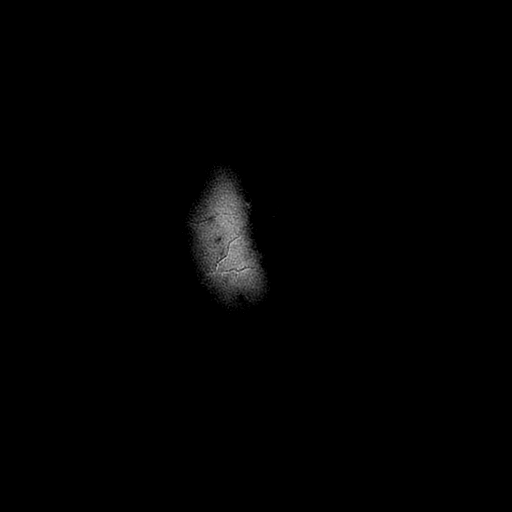

[Series 7: FLAIR · axial · 5.0mm · 0.43mm/px · z∈[-41,+113]mm · 3 of 27 slices shown]
[im 1/27]
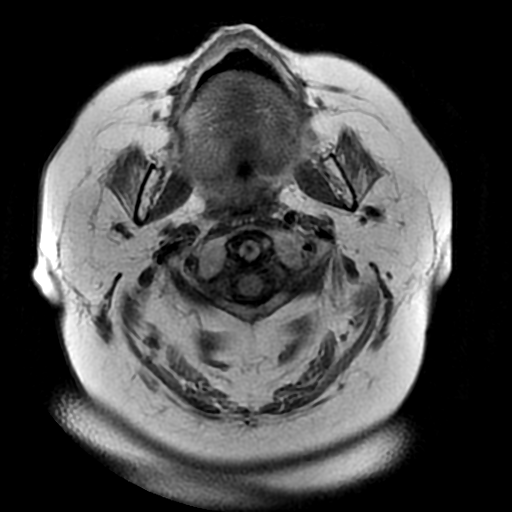
[im 14/27]
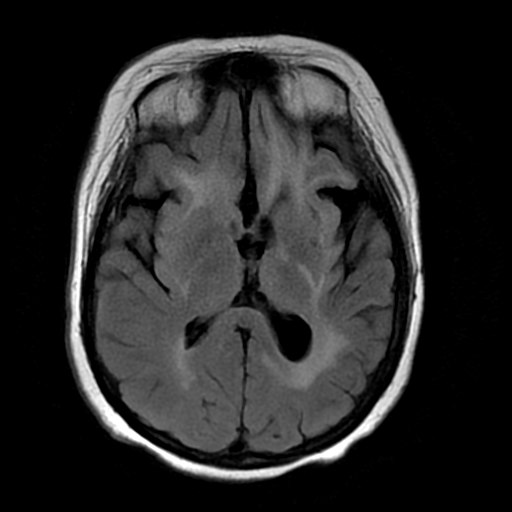
[im 27/27]
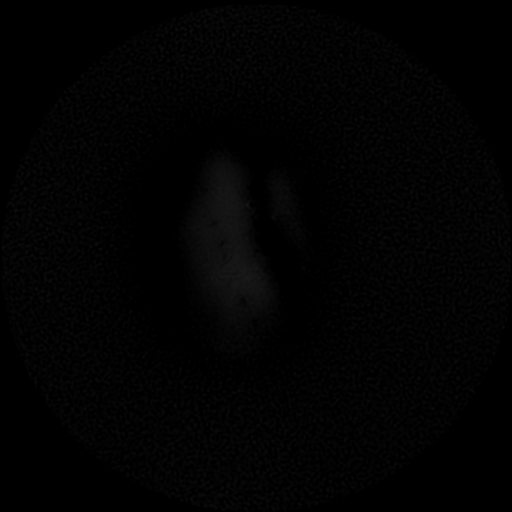

[Series 8: T2 · coronal · 5.0mm · 0.43mm/px · 3 of 30 slices shown (2 of 2)]
[im 1/30]
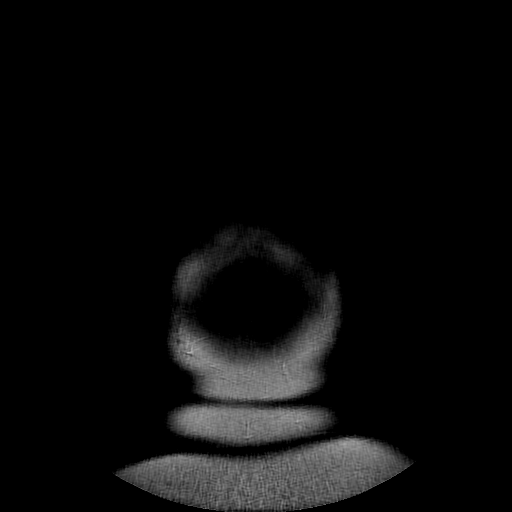
[im 15/30]
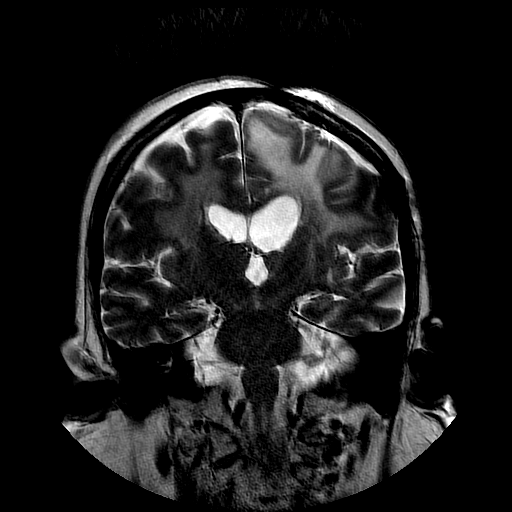
[im 30/30]
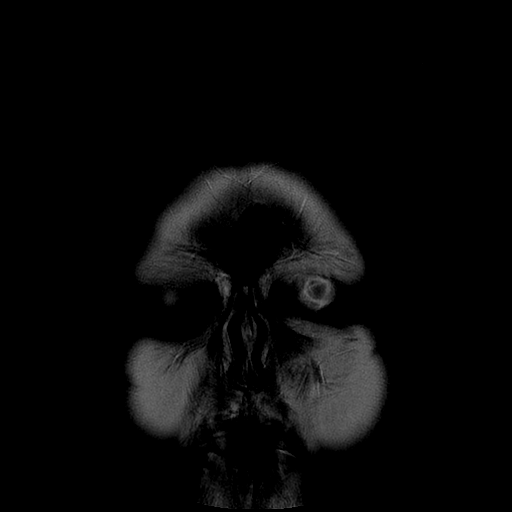

[Series 9: ax mpgr · axial · 5.0mm · 0.43mm/px · 1 of 27 slices shown]
[im 1/27]
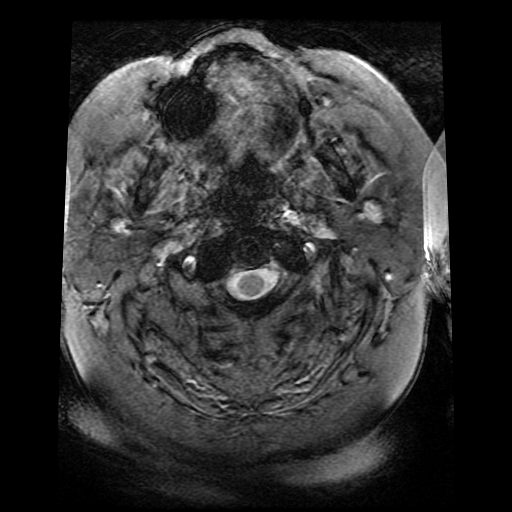

[Series 300: DWI · axial · 3.0mm · 1.09mm/px · z∈[-25,+116]mm · 5 of 49 slices shown (3 of 4)]
[im 1/49]
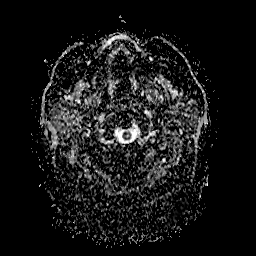
[im 13/49]
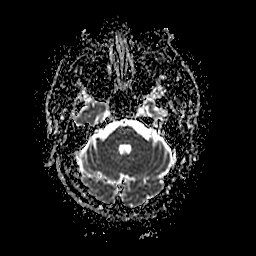
[im 25/49]
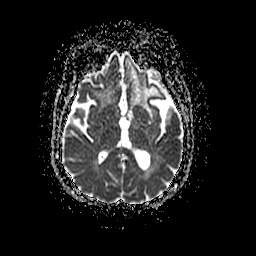
[im 37/49]
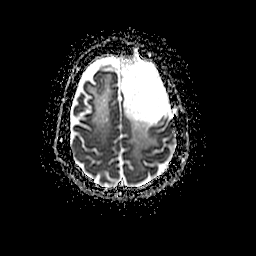
[im 49/49]
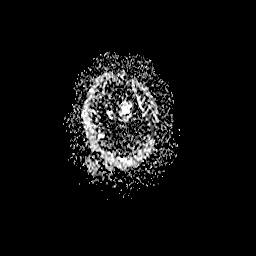

[Series 500: DWI · coronal · 5.0mm · 1.09mm/px · 3 of 33 slices shown (4 of 4)]
[im 1/33]
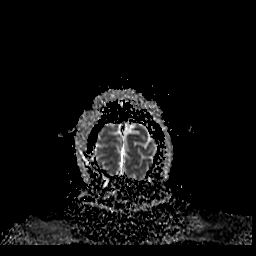
[im 17/33]
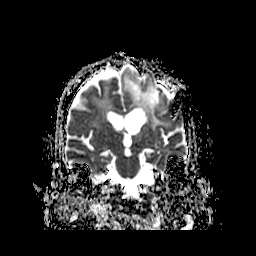
[im 33/33]
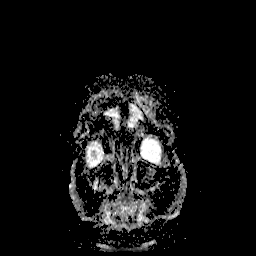

[35 of 48 positions shown; findings below may reference images not displayed]

FINDINGS: Brain: No diffusion signal abnormality. Partially empty sella
turcica. Large left frontal lobe resection cavity is stable in size.
Stable ex vacuo dilatation of frontal horn of left lateral
ventricle. Stable confluent T2 FLAIR hyperintense signal abnormality
throughout white matter in the left-greater-than-right hemispheres
of the brain. Stable mild parenchymal volume loss of the brain. No
new focal mass effect. No new susceptibility hypointensity to
indicate interval intracranial hemorrhage. No extra-axial
collection. No effacement of basilar cisterns.

Vascular: Normal flow voids.

Skull and upper cervical spine: Stable postsurgical changes related
to left frontal craniotomy.

Sinuses/Orbits: Negative.

Other: None.
IMPRESSION: 1. No evidence for acute infarct, new mass effect, or intracranial
hemorrhage.
2. Left frontal resection cavity is stable. Confluent T2 FLAIR
hyperintense white matter changes of the left-greater-than-right
hemispheres is also stable in distribution and consistent with
posttreatment change.
3. Stable brain parenchymal volume loss.

By: Romanoz Kongur M.D.

## 2017-08-30 IMAGING — CR DG CHEST 1V PORT
1 series · 1 of 1 positions shown · non-contrast
Comparison: Chest CTA and radiographs 11/28/2015

CLINICAL DATA: Pneumonia.

EXAM:
PORTABLE CHEST 1 VIEW

[AP]
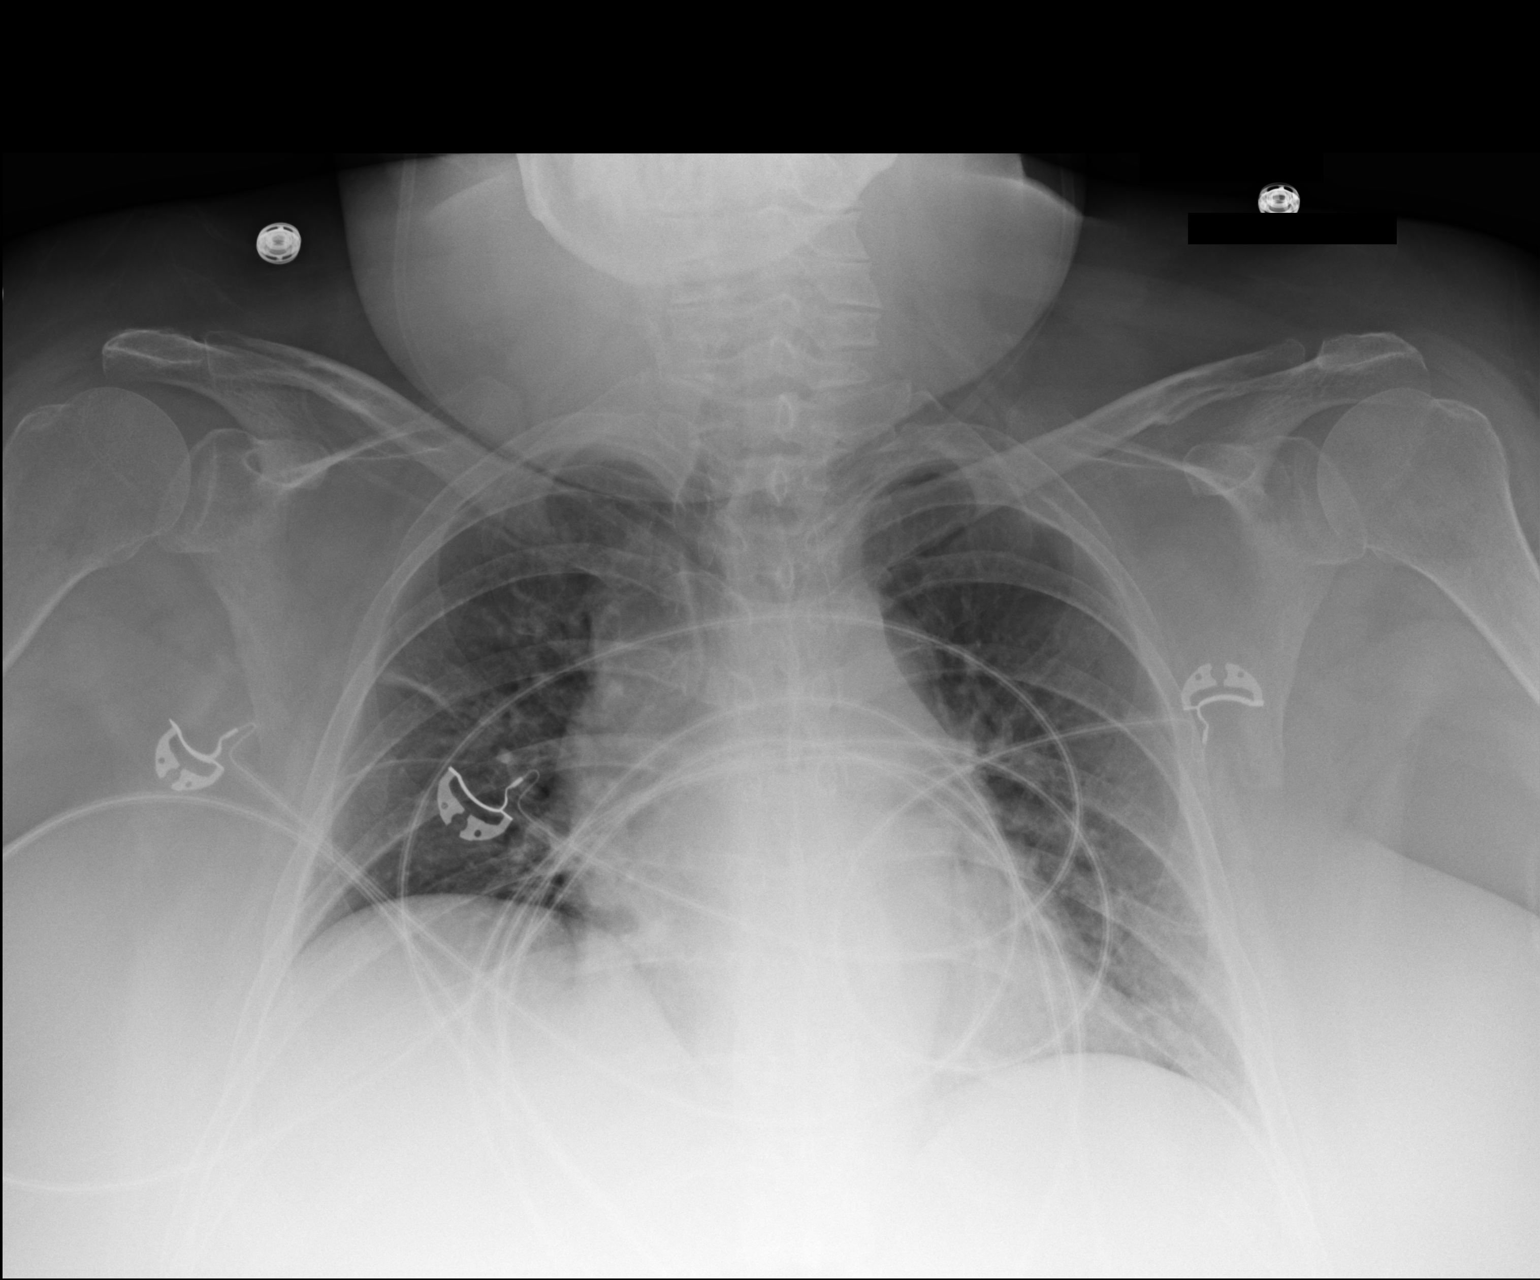

[1 of 1 positions shown; findings below may reference images not displayed]

FINDINGS: The cardiomediastinal silhouette is unchanged. The lungs remain
hypoinflated. Minimal left basilar opacity likely reflects
atelectasis. Small focus of curvilinear opacity in the right lung
also likely represents atelectasis. No overt pulmonary edema,
sizable pleural effusion, or pneumothorax is identified.
IMPRESSION: Low lung volumes with minimal atelectasis.
